# Patient Record
Sex: Female | Born: 1940
Health system: Southern US, Community
[De-identification: ages and names within clinical notes are randomized; demographics above are authoritative.]

## PROBLEM LIST (undated history)

## (undated) DIAGNOSIS — G459 Transient cerebral ischemic attack, unspecified: Secondary | ICD-10-CM

## (undated) DIAGNOSIS — T4145XA Adverse effect of unspecified anesthetic, initial encounter: Secondary | ICD-10-CM

## (undated) DIAGNOSIS — D126 Benign neoplasm of colon, unspecified: Secondary | ICD-10-CM

## (undated) DIAGNOSIS — C4370 Malignant melanoma of unspecified lower limb, including hip: Secondary | ICD-10-CM

## (undated) DIAGNOSIS — N86 Erosion and ectropion of cervix uteri: Secondary | ICD-10-CM

## (undated) DIAGNOSIS — K589 Irritable bowel syndrome without diarrhea: Secondary | ICD-10-CM

## (undated) DIAGNOSIS — G5600 Carpal tunnel syndrome, unspecified upper limb: Secondary | ICD-10-CM

## (undated) DIAGNOSIS — M810 Age-related osteoporosis without current pathological fracture: Secondary | ICD-10-CM

## (undated) DIAGNOSIS — T8859XA Other complications of anesthesia, initial encounter: Secondary | ICD-10-CM

## (undated) DIAGNOSIS — I2510 Atherosclerotic heart disease of native coronary artery without angina pectoris: Secondary | ICD-10-CM

## (undated) DIAGNOSIS — N950 Postmenopausal bleeding: Secondary | ICD-10-CM

## (undated) DIAGNOSIS — E119 Type 2 diabetes mellitus without complications: Secondary | ICD-10-CM

## (undated) DIAGNOSIS — I209 Angina pectoris, unspecified: Secondary | ICD-10-CM

## (undated) DIAGNOSIS — R112 Nausea with vomiting, unspecified: Secondary | ICD-10-CM

## (undated) DIAGNOSIS — N813 Complete uterovaginal prolapse: Secondary | ICD-10-CM

## (undated) DIAGNOSIS — Z86010 Personal history of colonic polyps: Secondary | ICD-10-CM

## (undated) DIAGNOSIS — Z8619 Personal history of other infectious and parasitic diseases: Secondary | ICD-10-CM

## (undated) DIAGNOSIS — Z9889 Other specified postprocedural states: Secondary | ICD-10-CM

## (undated) DIAGNOSIS — A419 Sepsis, unspecified organism: Secondary | ICD-10-CM

## (undated) DIAGNOSIS — L659 Nonscarring hair loss, unspecified: Secondary | ICD-10-CM

## (undated) DIAGNOSIS — K625 Hemorrhage of anus and rectum: Secondary | ICD-10-CM

## (undated) DIAGNOSIS — E039 Hypothyroidism, unspecified: Secondary | ICD-10-CM

## (undated) DIAGNOSIS — K219 Gastro-esophageal reflux disease without esophagitis: Secondary | ICD-10-CM

## (undated) DIAGNOSIS — Z860101 Personal history of adenomatous and serrated colon polyps: Secondary | ICD-10-CM

## (undated) DIAGNOSIS — I10 Essential (primary) hypertension: Secondary | ICD-10-CM

## (undated) DIAGNOSIS — E7800 Pure hypercholesterolemia, unspecified: Secondary | ICD-10-CM

## (undated) HISTORY — DX: Transient cerebral ischemic attack, unspecified: G45.9

## (undated) HISTORY — DX: Personal history of adenomatous and serrated colon polyps: Z86.0101

## (undated) HISTORY — DX: Postmenopausal bleeding: N95.0

## (undated) HISTORY — DX: Hypothyroidism, unspecified: E03.9

## (undated) HISTORY — DX: Benign neoplasm of colon, unspecified: D12.6

## (undated) HISTORY — DX: Sepsis, unspecified organism: A41.9

## (undated) HISTORY — DX: Complete uterovaginal prolapse: N81.3

## (undated) HISTORY — DX: Age-related osteoporosis without current pathological fracture: M81.0

## (undated) HISTORY — DX: Malignant melanoma of unspecified lower limb, including hip: C43.70

## (undated) HISTORY — DX: Pure hypercholesterolemia, unspecified: E78.00

## (undated) HISTORY — DX: Nonscarring hair loss, unspecified: L65.9

## (undated) HISTORY — DX: Erosion and ectropion of cervix uteri: N86

## (undated) HISTORY — DX: Hemorrhage of anus and rectum: K62.5

## (undated) HISTORY — DX: Personal history of colonic polyps: Z86.010

## (undated) HISTORY — PX: BREAST EXCISIONAL BIOPSY: SUR124

## (undated) HISTORY — DX: Personal history of other infectious and parasitic diseases: Z86.19

## (undated) HISTORY — DX: Irritable bowel syndrome without diarrhea: K58.9

## (undated) HISTORY — DX: Type 2 diabetes mellitus without complications: E11.9

## (undated) HISTORY — DX: Essential (primary) hypertension: I10

## (undated) HISTORY — DX: Carpal tunnel syndrome, unspecified upper limb: G56.00

## (undated) HISTORY — PX: KNEE SURGERY: SHX244

## (undated) HISTORY — DX: Atherosclerotic heart disease of native coronary artery without angina pectoris: I25.10

## (undated) HISTORY — PX: TONSILLECTOMY: SHX5217

## (undated) HISTORY — DX: Gastro-esophageal reflux disease without esophagitis: K21.9

---

## 2004-10-22 ENCOUNTER — Ambulatory Visit: Payer: Self-pay | Admitting: General Practice

## 2004-11-06 ENCOUNTER — Ambulatory Visit: Payer: Self-pay | Admitting: General Practice

## 2005-08-10 ENCOUNTER — Other Ambulatory Visit: Payer: Self-pay

## 2005-08-17 ENCOUNTER — Ambulatory Visit: Payer: Self-pay | Admitting: Unknown Physician Specialty

## 2008-01-15 ENCOUNTER — Ambulatory Visit: Payer: Self-pay | Admitting: Family Medicine

## 2008-03-25 HISTORY — PX: COLONOSCOPY: SHX174

## 2008-04-01 ENCOUNTER — Ambulatory Visit: Payer: Self-pay | Admitting: Family Medicine

## 2008-04-19 ENCOUNTER — Ambulatory Visit: Payer: Self-pay | Admitting: Unknown Physician Specialty

## 2008-05-14 DIAGNOSIS — K5730 Diverticulosis of large intestine without perforation or abscess without bleeding: Secondary | ICD-10-CM

## 2008-06-21 ENCOUNTER — Ambulatory Visit: Payer: Self-pay | Admitting: Surgery

## 2008-08-08 DIAGNOSIS — E538 Deficiency of other specified B group vitamins: Secondary | ICD-10-CM | POA: Insufficient documentation

## 2009-01-27 ENCOUNTER — Ambulatory Visit: Payer: Self-pay | Admitting: Family Medicine

## 2010-01-30 ENCOUNTER — Ambulatory Visit: Payer: Self-pay | Admitting: Family Medicine

## 2010-10-24 HISTORY — PX: CARDIAC CATHETERIZATION: SHX172

## 2011-12-31 ENCOUNTER — Ambulatory Visit: Payer: Self-pay | Admitting: Family Medicine

## 2013-01-16 ENCOUNTER — Ambulatory Visit: Payer: Self-pay | Admitting: Family Medicine

## 2013-09-28 ENCOUNTER — Ambulatory Visit: Payer: Self-pay | Admitting: Unknown Physician Specialty

## 2013-09-28 LAB — HM COLONOSCOPY

## 2013-10-04 ENCOUNTER — Ambulatory Visit: Payer: Self-pay | Admitting: Family Medicine

## 2013-11-07 LAB — LIPID PANEL
Cholesterol: 165 mg/dL (ref 0–200)
HDL: 63 mg/dL (ref 35–70)
LDL Cholesterol: 60 mg/dL
Triglycerides: 208 mg/dL — AB (ref 40–160)

## 2013-11-07 LAB — TSH: TSH: 1.1 u[IU]/mL (ref 0.41–5.90)

## 2013-11-07 LAB — HM DEXA SCAN

## 2014-04-29 LAB — BASIC METABOLIC PANEL
BUN: 11 mg/dL (ref 4–21)
CREATININE: 0.6 mg/dL (ref 0.5–1.1)
Glucose: 109 mg/dL
Potassium: 4.6 mmol/L (ref 3.4–5.3)
Sodium: 140 mmol/L (ref 137–147)

## 2014-04-29 LAB — CBC AND DIFFERENTIAL
HCT: 36 % (ref 36–46)
Hemoglobin: 11.9 g/dL — AB (ref 12.0–16.0)
Platelets: 324 10*3/uL (ref 150–399)
WBC: 8.9 10*3/mL

## 2014-04-29 LAB — HEMOGLOBIN A1C: HEMOGLOBIN A1C: 6.6 % — AB (ref 4.0–6.0)

## 2014-05-25 ENCOUNTER — Encounter: Payer: Self-pay | Admitting: *Deleted

## 2014-06-14 DIAGNOSIS — N950 Postmenopausal bleeding: Secondary | ICD-10-CM | POA: Insufficient documentation

## 2014-06-14 DIAGNOSIS — G459 Transient cerebral ischemic attack, unspecified: Secondary | ICD-10-CM | POA: Insufficient documentation

## 2014-06-14 DIAGNOSIS — E039 Hypothyroidism, unspecified: Secondary | ICD-10-CM | POA: Insufficient documentation

## 2014-06-14 DIAGNOSIS — E1165 Type 2 diabetes mellitus with hyperglycemia: Secondary | ICD-10-CM | POA: Insufficient documentation

## 2014-06-14 DIAGNOSIS — Z86010 Personal history of colonic polyps: Secondary | ICD-10-CM

## 2014-06-14 DIAGNOSIS — C4370 Malignant melanoma of unspecified lower limb, including hip: Secondary | ICD-10-CM | POA: Insufficient documentation

## 2014-06-14 DIAGNOSIS — N813 Complete uterovaginal prolapse: Secondary | ICD-10-CM | POA: Insufficient documentation

## 2014-06-14 DIAGNOSIS — E7800 Pure hypercholesterolemia, unspecified: Secondary | ICD-10-CM | POA: Insufficient documentation

## 2014-06-14 DIAGNOSIS — I10 Essential (primary) hypertension: Secondary | ICD-10-CM | POA: Insufficient documentation

## 2014-06-14 DIAGNOSIS — K625 Hemorrhage of anus and rectum: Secondary | ICD-10-CM | POA: Insufficient documentation

## 2014-06-14 DIAGNOSIS — E119 Type 2 diabetes mellitus without complications: Secondary | ICD-10-CM | POA: Insufficient documentation

## 2014-06-14 DIAGNOSIS — K219 Gastro-esophageal reflux disease without esophagitis: Secondary | ICD-10-CM | POA: Insufficient documentation

## 2014-06-14 DIAGNOSIS — M810 Age-related osteoporosis without current pathological fracture: Secondary | ICD-10-CM | POA: Insufficient documentation

## 2014-06-14 DIAGNOSIS — G5600 Carpal tunnel syndrome, unspecified upper limb: Secondary | ICD-10-CM | POA: Insufficient documentation

## 2014-06-14 DIAGNOSIS — I2510 Atherosclerotic heart disease of native coronary artery without angina pectoris: Secondary | ICD-10-CM | POA: Insufficient documentation

## 2014-06-14 DIAGNOSIS — L659 Nonscarring hair loss, unspecified: Secondary | ICD-10-CM

## 2014-06-14 DIAGNOSIS — K589 Irritable bowel syndrome without diarrhea: Secondary | ICD-10-CM

## 2014-08-14 ENCOUNTER — Ambulatory Visit (INDEPENDENT_AMBULATORY_CARE_PROVIDER_SITE_OTHER): Payer: PPO | Admitting: Obstetrics and Gynecology

## 2014-08-14 ENCOUNTER — Other Ambulatory Visit
Admission: RE | Admit: 2014-08-14 | Discharge: 2014-08-14 | Disposition: A | Discharge status: Home / Self Care | Payer: PPO | Source: Ambulatory Visit | Attending: Family Medicine | Admitting: Family Medicine

## 2014-08-14 ENCOUNTER — Encounter: Payer: Self-pay | Admitting: Obstetrics and Gynecology

## 2014-08-14 ENCOUNTER — Ambulatory Visit (INDEPENDENT_AMBULATORY_CARE_PROVIDER_SITE_OTHER): Payer: PPO | Admitting: Family Medicine

## 2014-08-14 ENCOUNTER — Telehealth: Payer: Self-pay

## 2014-08-14 ENCOUNTER — Encounter: Payer: Self-pay | Admitting: Family Medicine

## 2014-08-14 ENCOUNTER — Telehealth: Payer: Self-pay | Admitting: Family Medicine

## 2014-08-14 VITALS — BP 197/92 | HR 68 | Ht 63.6 in | Wt 150.4 lb

## 2014-08-14 VITALS — BP 164/92 | HR 63 | Temp 98.6°F | Resp 16

## 2014-08-14 DIAGNOSIS — I25119 Atherosclerotic heart disease of native coronary artery with unspecified angina pectoris: Secondary | ICD-10-CM

## 2014-08-14 DIAGNOSIS — I2510 Atherosclerotic heart disease of native coronary artery without angina pectoris: Secondary | ICD-10-CM | POA: Diagnosis not present

## 2014-08-14 DIAGNOSIS — Z4689 Encounter for fitting and adjustment of other specified devices: Secondary | ICD-10-CM

## 2014-08-14 DIAGNOSIS — E559 Vitamin D deficiency, unspecified: Secondary | ICD-10-CM | POA: Diagnosis not present

## 2014-08-14 DIAGNOSIS — I10 Essential (primary) hypertension: Secondary | ICD-10-CM

## 2014-08-14 DIAGNOSIS — R079 Chest pain, unspecified: Secondary | ICD-10-CM | POA: Insufficient documentation

## 2014-08-14 LAB — CBC
HCT: 35.5 % (ref 35.0–47.0)
Hemoglobin: 11.5 g/dL — ABNORMAL LOW (ref 12.0–16.0)
MCH: 25.9 pg — ABNORMAL LOW (ref 26.0–34.0)
MCHC: 32.3 g/dL (ref 32.0–36.0)
MCV: 80.2 fL (ref 80.0–100.0)
Platelets: 342 10*3/uL (ref 150–440)
RBC: 4.43 MIL/uL (ref 3.80–5.20)
RDW: 14.6 % — ABNORMAL HIGH (ref 11.5–14.5)
WBC: 8.8 10*3/uL (ref 3.6–11.0)

## 2014-08-14 LAB — RENAL FUNCTION PANEL
Albumin: 4 g/dL (ref 3.5–5.0)
Anion gap: 11 (ref 5–15)
BUN: 10 mg/dL (ref 6–20)
CALCIUM: 9.4 mg/dL (ref 8.9–10.3)
CO2: 28 mmol/L (ref 22–32)
CREATININE: 0.58 mg/dL (ref 0.44–1.00)
Chloride: 98 mmol/L — ABNORMAL LOW (ref 101–111)
GFR calc Af Amer: >60 mL/min (ref >60)
GFR calc non Af Amer: >60 mL/min (ref >60)
GLUCOSE: 103 mg/dL — AB (ref 65–99)
PHOSPHORUS: 3.1 mg/dL (ref 2.5–4.6)
Potassium: 3.5 mmol/L (ref 3.5–5.1)
SODIUM: 137 mmol/L (ref 135–145)

## 2014-08-14 LAB — TSH: TSH: 3.019 u[IU]/mL (ref 0.350–4.500)

## 2014-08-14 LAB — TROPONIN I

## 2014-08-14 MED ORDER — AMLODIPINE BESYLATE 5 MG PO TABS: 5.0000 mg | ORAL_TABLET | Freq: Every day | ORAL | Status: DC

## 2014-08-14 MED ORDER — NITROGLYCERIN 0.4 MG SL SUBL: 0.4000 mg | SUBLINGUAL_TABLET | SUBLINGUAL | Status: DC | PRN

## 2014-08-14 NOTE — Telephone Encounter (Signed)
Pt advised as directed below.   Thanks,   -Keiji Melland  

## 2014-08-14 NOTE — Telephone Encounter (Signed)
April Pittman with [facility] would like a nurse to return his call because they received an RX today that will have adverse reaction to medication that pt is already taking. Simvastatin & Amlodipine are the medications that April Pittman stated would have adverse effects for the pt. Thanks TNP

## 2014-08-14 NOTE — Progress Notes (Signed)
Patient: April Pittman Female    DOB: [birth year]   74 y.o.   MRN: [id number] Visit Date: 08/14/2014  Today's Provider: [name], MD   Chief Complaint  Patient presents with  . Chest Pain   Subjective:    Chest Pain  This is a new problem. The current episode started more than 1 month ago. The onset quality is undetermined. The problem occurs intermittently. The pain is at a severity of 3/10. The pain is mild. The quality of the pain is described as dull. The pain radiates to the mid back. Associated symptoms include a cough, malaise/fatigue, palpitations and shortness of breath. Pertinent negatives include no abdominal pain, dizziness, exertional chest pressure, fever, headaches or leg pain. It is unknown what precipitates the cough. The cough is hacking. Nothing worsens the cough.  Her past medical history is significant for diabetes, hypertension and thyroid problem.  Her family medical history is significant for CAD, heart disease, hypertension and stroke.  Patient was at her GYN this morning and had her bp check twice 1=197/92 and 2=167/92. Patient came in to office to get bp check again. Patient has been having chest back for several weeks. Patient describes pain as feeling like gas pressure that radiates to her back. Pain is not similar to previous chest pains associated with heart disease. She states she has pains most days, but it feels more like gas. Is scheduled for stress test tomorrow ordered by Dr. Ubaldo Glassing due to dyspnea on exertion when she was seen by him in May. She was not having chest pain at that time.   Previous Medications   ASPIRIN EC 81 MG TABLET    Take 81 mg by mouth daily.   CHOLECALCIFEROL (VITAMIN D) 400 UNITS TABS TABLET    Take 1,000 Units by mouth daily.   CLOPIDOGREL (PLAVIX) 75 MG TABLET    Take 75 mg by mouth daily.   ESTRADIOL (ESTRACE) 0.1 MG/GM VAGINAL CREAM    Place 1 Applicatorful vaginally once a week.   LEVOTHYROXINE (SYNTHROID, LEVOTHROID) 125  MCG TABLET    Take 125 mcg by mouth daily before breakfast.   LISINOPRIL-HYDROCHLOROTHIAZIDE (PRINZIDE,ZESTORETIC) 20-25 MG PER TABLET    Take 1 tablet by mouth daily.   METFORMIN (GLUCOPHAGE) 1000 MG TABLET    Take 1,000 mg by mouth 2 (two) times daily with a meal.   METOPROLOL (LOPRESSOR) 50 MG TABLET    Take 50 mg by mouth daily.   OXYQUINOLONE SULFATE VAGINAL 0.025 % GEL    Place 1 application vaginally once a week.   SIMVASTATIN (ZOCOR) 40 MG TABLET    Take 40 mg by mouth daily.   TIZANIDINE (ZANAFLEX) 4 MG CAPSULE    Take 4 mg by mouth 4 (four) times daily as needed for muscle spasms.    Review of Systems  Constitutional: Positive for malaise/fatigue. Negative for fever.  Respiratory: Positive for cough and shortness of breath. Negative for wheezing.   Cardiovascular: Positive for chest pain and palpitations. Negative for leg swelling.  Gastrointestinal: Negative for abdominal pain.  Neurological: Negative for dizziness and headaches.    History  Substance Use Topics  . Smoking status: Never Smoker   . Smokeless tobacco: Never Used  . Alcohol Use: No   Objective:   Filed Vitals:   08/14/14 1023 08/14/14 1051  BP: 140/94 164/92  Pulse: 63   Temp: 98.6 F (37 C)   TempSrc: Oral   Resp: 16   SpO2: 98%  Physical Exam      Assessment & Plan:      1. Essential hypertension BP unsually elevated at gyn today, but better now.  - amLODipine (NORVASC) 5 MG tablet; Take 1 tablet (5 mg total) by mouth daily.  Dispense: 30 tablet; Refill: 0 - Renal Function Panel - TSH  2. Chest pain, unspecified chest pain type Not typical for cardiac angina, but she does have history of CAD. She is scheduled for stress test tomorrow, but I think we need to check cardiac enzymes first.  - Troponin I - CBC - Renal Function Panel - nitroGLYCERIN (NITROSTAT) 0.4 MG SL tablet; Place 1 tablet (0.4 mg total) under the tongue every 5 (five) minutes as needed for chest pain.  Dispense: 10  tablet; Refill: 0  3. Coronary artery disease involving native coronary artery of native heart without angina pectoris Stress test tomorrow as ordered by Dr. Ubaldo Glassing if cardiac enzymes are normal today.

## 2014-08-14 NOTE — Telephone Encounter (Signed)
Tried pt again still no answer.

## 2014-08-14 NOTE — Telephone Encounter (Signed)
-----   Message from Birdie Sons, MD sent at 08/14/2014  2:56 PM EDT ----- Please advise patient that labs are all normal and that her chest pain is probably not her heart. Go ahead and start amlodipine that was prescribed to keep her blood pressure down and follow up for stress tomorrow as already scheduled.

## 2014-08-14 NOTE — Patient Instructions (Signed)
1.  Return 2 weeks for pessary insertion. 2.  Follow-up for exercise tolerance test with Dr. Ubaldo Glassing tomorrow

## 2014-08-14 NOTE — Progress Notes (Signed)
Patient ID: April Pittman, female   DOB: March 04, 1940, 74 y.o.   MRN: [id number] Presents for pessary (gellhorn) maintenance. Pessary not in for last few days. Pt. Takes out as she desires. Using Estrace Cream "every so often", and Trimo-san gel weekly. No vaginal discharge, odor, bleeding or pain. B&B habits normal. No c/o UTI SYMPTONS. PT. States she has had chest pain and seen Dr. Ubaldo Glassing and has stress test tomorrow.  GYN ENCOUNTER NOTE  Subjective:       April Pittman is a 74 y.o. [phone] female is here for gynecologic evaluation of the following issues:  1. Pessary maintenance. 2.  Uterine procidentia with traction cystocele.  Patient is still able to insert and remove her gel horn pessary.  It has been out several days.  She only removed the pessary.  Once since her last visit.  She denies vaginal bleeding or discharge..     Gynecologic History No LMP recorded. Patient is postmenopausal. Contraception: post menopausal status   Obstetric History OB History  Gravida Para Term Preterm AB SAB TAB Ectopic Multiple Living  4 4 4       4     # Outcome Date GA Lbr Len/2nd Weight Sex Delivery Anes PTL Lv  4 Term     M Vag-Spont     3 Term     M Vag-Spont   Y  2 Term     F Vag-Spont   Y  1 Term     M Vag-Spont   Y      Past Medical History  Diagnosis Date  . Procidentia of uterus     with traction cystocele, managed with gellhorn pessary  . H/O adenomatous polyp of colon     with sever dysplasia, removed 2000 by colonoscopy  . Osteoporosis   . Post-menopausal bleeding   . Hypothyroid   . Hypertension   . Hypercholesteremia   . TIA (transient ischemic attack)   . GERD (gastroesophageal reflux disease)   . Alopecia   . Malignant melanoma of foot   . Irritable bowel   . CAD (coronary artery disease)   . DM (diabetes mellitus)   . Benign neoplasm of large bowel   . Carpal tunnel syndrome   . Erosion of cervix   . Rectal bleeding     No past surgical history on  file.  Current Outpatient Prescriptions on File Prior to Visit  Medication Sig Dispense Refill  . aspirin EC 81 MG tablet Take 81 mg by mouth daily.    . clopidogrel (PLAVIX) 75 MG tablet Take 75 mg by mouth daily.    Marland Kitchen estradiol (ESTRACE) 0.1 MG/GM vaginal cream Place 1 Applicatorful vaginally once a week.    . levothyroxine (SYNTHROID, LEVOTHROID) 125 MCG tablet Take 125 mcg by mouth daily before breakfast.    . lisinopril-hydrochlorothiazide (PRINZIDE,ZESTORETIC) 20-25 MG per tablet Take 1 tablet by mouth daily.    . metFORMIN (GLUCOPHAGE) 1000 MG tablet Take 1,000 mg by mouth 2 (two) times daily with a meal.    . metoprolol (LOPRESSOR) 50 MG tablet Take 50 mg by mouth daily.    Levin Erp SULFATE VAGINAL 0.025 % GEL Place 1 application vaginally once a week.    . simvastatin (ZOCOR) 40 MG tablet Take 40 mg by mouth daily.    Marland Kitchen tiZANidine (ZANAFLEX) 4 MG capsule Take 4 mg by mouth 4 (four) times daily as needed for muscle spasms.     No current facility-administered medications on file prior  to visit.    No Known Allergies  History   Social History  . Marital Status: Married    Spouse Name: N/A  . Number of Children: N/A  . Years of Education: N/A   Occupational History  . Not on file.   Social History Main Topics  . Smoking status: Never Smoker   . Smokeless tobacco: Never Used  . Alcohol Use: No  . Drug Use: No  . Sexual Activity: No   Other Topics Concern  . Not on file   Social History Narrative    No family history on file.  The following portions of the patient's history were reviewed and updated as appropriate: allergies, current medications, past family history, past medical history, past social history, past surgical history and problem list.  Review of Systems  Review of Systems - General ROS: negative for - chills, fatigue, fever, hot flashes, malaise or night sweats Gastrointestinal ROS: negative for - abdominal pain, blood in stools, change in  bowel habits and nausea/vomiting Musculoskeletal ROS: negative for - joint pain, muscle pain or muscular weakness Genito-Urinary ROS: negative for - genital discharge, genital ulcers, hematuria, incontinence, irregular/heavy menses, nocturia or pelvic pain.  Objective:   BP 167/92 mmHg  Pulse 62  Ht 5' 3.6" (1.615 m)  Wt 150 lb 6 oz (68.21 kg)  BMI 26.15 kg/m2 CONSTITUTIONAL: Well-developed, well-nourished female in no acute distress. Mild senile dementia. HENT:  Normocephalic, atraumatic.  SKIN: Skin is warm and dry. No rash noted. Not diaphoretic. No erythema. No pallor. Needles: Alert and oriented to person, place, and time. PSYCHIATRIC: Normal mood and affect. Normal behavior. Normal judgment and thought content. CARDIOVASCULAR:Not Examined RESPIRATORY: Not Examined BREASTS: Not Examined ABDOMEN: Soft, non distended; Non tender.  No Organomegaly. PELVIC:  External Genitalia: Normal  BUS: Normal  Vagina: Normal  Cervix: Parous; ecchymotic in several areas without ulcerations  Uterus: Normal size, shape,consistency, mobile  RV: Normal External exam  Bladder: Nontender MUSCULOSKELETAL: Normal range of motion. No tenderness.  No cyanosis, clubbing, or edema.     Assessment:   1.  Pessary maintenance. 2.  Cervix with ecchymoses without ulceration, possibly due to trauma from prolonged pessary use   Plan:  1.  Leave pessary out for 2 weeks. 2.  Return in 2 weeks for pessary insertion after recheck.

## 2014-08-14 NOTE — Telephone Encounter (Signed)
Go ahead and start amlodipine, but reduce simvastatin to 1/2 tablet a day (20mg ) for the time being. Thanks.

## 2014-08-14 NOTE — Telephone Encounter (Signed)
Pt advised as directed below.  She was checking to see if you got the lab work back yet?  Thanks,   -Mickel Baas

## 2014-08-14 NOTE — Telephone Encounter (Signed)
No answer at pt home #. Will try again later.

## 2014-08-28 ENCOUNTER — Ambulatory Visit (INDEPENDENT_AMBULATORY_CARE_PROVIDER_SITE_OTHER): Payer: PPO | Admitting: Obstetrics and Gynecology

## 2014-08-28 ENCOUNTER — Encounter: Payer: Self-pay | Admitting: Obstetrics and Gynecology

## 2014-08-28 VITALS — BP 173/77 | HR 77 | Ht 63.6 in | Wt 152.6 lb

## 2014-08-28 DIAGNOSIS — R300 Dysuria: Secondary | ICD-10-CM

## 2014-08-28 DIAGNOSIS — Z4689 Encounter for fitting and adjustment of other specified devices: Secondary | ICD-10-CM | POA: Diagnosis not present

## 2014-08-28 LAB — POCT URINALYSIS DIPSTICK
Bilirubin, UA: NEGATIVE
Blood, UA: NEGATIVE
Ketones, UA: NEGATIVE
Leukocytes, UA: NEGATIVE
NITRITE UA: NEGATIVE
PH UA: 6
PROTEIN UA: NEGATIVE
Spec Grav, UA: 1.015
UROBILINOGEN UA: 0.2

## 2014-08-28 NOTE — Progress Notes (Signed)
Patient ID: April Pittman, female   DOB: 07/15/1940, 74 y.o.   MRN: [id number]   2 week f/u pessary insertion- gellhorn

## 2014-08-29 ENCOUNTER — Encounter: Payer: Self-pay | Admitting: Obstetrics and Gynecology

## 2014-08-29 NOTE — Progress Notes (Signed)
   Subjective:    Patient ID: April Pittman, female    DOB: [birth year], 74 y.o.   MRN: [id number]  HPI  Patient presents for two-week pessary check.  GelPort pessary was left out.  Because of ulceration of vagina.  She denies vaginal bleeding or discharge at this time.    Review of Systems GU-negative Except for dysuria GYN-negative. GI-negative. Constitutional -no fever, chills or sweats    Objective:   Physical Exam  Pleasant female in no acute distress.  Mild dementia.  Neurologic changes noted. Abdomen-soft, nontender. Pelvic-  External genitalia normal.  BUS normal.  Vagina normal; ulceration resolved  Cervix normal  PROCEDURE: Pessary is reinserted        Assessment & Plan:  Vaginal ulceration, resolved. Pessary reinserted. Return in 4 months for recheck. Urine culture.

## 2014-09-24 ENCOUNTER — Other Ambulatory Visit: Payer: Self-pay | Admitting: Family Medicine

## 2014-11-11 ENCOUNTER — Encounter: Payer: Self-pay | Admitting: Family Medicine

## 2014-11-11 ENCOUNTER — Ambulatory Visit (INDEPENDENT_AMBULATORY_CARE_PROVIDER_SITE_OTHER): Payer: PPO | Admitting: Family Medicine

## 2014-11-11 ENCOUNTER — Other Ambulatory Visit: Payer: Self-pay | Admitting: Family Medicine

## 2014-11-11 VITALS — BP 140/90 | HR 83 | Temp 98.5°F | Resp 16 | Ht 63.6 in | Wt 152.0 lb

## 2014-11-11 DIAGNOSIS — M810 Age-related osteoporosis without current pathological fracture: Secondary | ICD-10-CM

## 2014-11-11 DIAGNOSIS — Z0000 Encounter for general adult medical examination without abnormal findings: Secondary | ICD-10-CM | POA: Diagnosis not present

## 2014-11-11 DIAGNOSIS — Z1231 Encounter for screening mammogram for malignant neoplasm of breast: Secondary | ICD-10-CM

## 2014-11-11 DIAGNOSIS — E039 Hypothyroidism, unspecified: Secondary | ICD-10-CM | POA: Diagnosis not present

## 2014-11-11 DIAGNOSIS — E7800 Pure hypercholesterolemia, unspecified: Secondary | ICD-10-CM

## 2014-11-11 DIAGNOSIS — E119 Type 2 diabetes mellitus without complications: Secondary | ICD-10-CM

## 2014-11-11 DIAGNOSIS — I2510 Atherosclerotic heart disease of native coronary artery without angina pectoris: Secondary | ICD-10-CM

## 2014-11-11 DIAGNOSIS — I10 Essential (primary) hypertension: Secondary | ICD-10-CM

## 2014-11-11 DIAGNOSIS — E559 Vitamin D deficiency, unspecified: Secondary | ICD-10-CM | POA: Diagnosis not present

## 2014-11-11 NOTE — Patient Instructions (Signed)
.   I recommend that you get a flu vaccine this year. Please call our office at [phone] at your earliest convenience to schedule a flu shot.    

## 2014-11-11 NOTE — Progress Notes (Signed)
Patient: April Pittman, Female    DOB: [birth year], 74 y.o.   MRN: [id number] Visit Date: 11/11/2014  Today's Provider: [name], MD   Chief Complaint  Patient presents with  . Medicare Wellness   Subjective:    Annual wellness visit April Pittman is a 74 y.o. female. She feels fairly well. She reports exercising yes/walking. She reports she is sleeping fairly well.  -----------------------------------------------------------  Follow-up for CAD and chest pain from 08/14/2014; labs and started nitroGLYCERIN (NITROSTAT) 0.4 MG SL tablet; Place 1 tablet (0.4 mg total) under the tongue every 5 (five) minutes as needed for chest pain. Follow-up for vitamin D and vitamin B deficiency from 04/29/2014; started vitamin D3 OTC 1,000 units qd and started vitamin B12 OTC 1,000 mg qd.    Diabetes Mellitus Type II, Follow-up:   Lab Results  Component Value Date   HGBA1C 6.6* 04/29/2014   Last seen for diabetes 6 months ago.  Management since then includes none. She reports good compliance with treatment. She is not having side effects. none Current symptoms include none and have been stable. Home blood sugar records: fasting range: 115  Episodes of hypoglycemia? no   Current Insulin Regimen: n/a Most Recent Eye Exam: 2014  Weight trend: stable Prior visit with dietician: no Current diet: well balanced Current exercise: walking  ------------------------------------------------------------------------   Hypertension, follow-up:  BP Readings from Last 3 Encounters:  11/11/14 140/90  08/28/14 173/77  08/14/14 164/92    She was last seen for hypertension 3 months ago.  BP at that visit was 140/94. Management since that visit includes; started amLODipine (NORVASC) 5 MG tablet; Take 1 tablet (5 mg total) by mouth daily .She reports good compliance with treatment. She is not having side effects. none  She is exercising. She is not adherent to low salt diet.     Outside blood pressures are only checks it here. She is experiencing fatigue.  Patient denies fatigue.   Cardiovascular risk factors include diabetes mellitus.  Use of agents associated with hypertension: none.   ---------------------------------------------------------------------    Lipid/Cholesterol, Follow-up:   Last seen for this 6 months ago.  Management since that visit includes none.  Last Lipid Panel:    Component Value Date/Time   CHOL 165 11/07/2013   TRIG 208* 11/07/2013   HDL 63 11/07/2013   LDLCALC 60 11/07/2013    She reports good compliance with treatment. She is not having side effects. none  Wt Readings from Last 3 Encounters:  11/11/14 152 lb (68.947 kg)  08/28/14 152 lb 9.6 oz (69.219 kg)  08/14/14 150 lb 6 oz (68.21 kg)    ------------------------------------------------------------------------  Lab Results  Component Value Date   TSH 3.019 08/14/2014     Review of Systems  Constitutional: Positive for fatigue.  HENT: Positive for sneezing.   Eyes: Positive for photophobia.  Respiratory: Positive for cough and shortness of breath.   Cardiovascular: Negative.   Gastrointestinal: Negative.   Endocrine: Negative.   Genitourinary: Negative.   Musculoskeletal: Negative.   Skin: Negative.   Allergic/Immunologic: Negative.   Neurological: Negative.   Hematological: Positive for adenopathy.  Psychiatric/Behavioral: Negative.     Social History   Social History  . Marital Status: Married    Spouse Name: N/A  . Number of Children: 3  . Years of Education: N/A   Occupational History  . Retired     former Engineer, manufacturing systems   Social History Main Topics  . Smoking status:  Never Smoker   . Smokeless tobacco: Never Used  . Alcohol Use: No  . Drug Use: No  . Sexual Activity: No   Other Topics Concern  . Not on file   Social History Narrative    Patient Active Problem List   Diagnosis Date Noted  . Vitamin D deficiency 08/14/2014   . Rectal/anal hemorrhage 06/14/2014  . Postmenopausal vaginal bleeding 06/14/2014  . Hypothyroidism 06/14/2014  . Hypertension 06/14/2014  . Hypercholesteremia 06/14/2014  . TIA (transient ischemic attack) 06/14/2014  . GERD (gastroesophageal reflux disease) 06/14/2014  . Alopecia 06/14/2014  . Malignant melanoma of foot 06/14/2014  . Irritable bowel 06/14/2014  . Coronary artery disease 06/14/2014  . Diabetes mellitus, type 2 06/14/2014  . Carpal tunnel syndrome 06/14/2014  . Procidentia of uterus 06/14/2014  . Osteoporosis 06/14/2014  . History of adenomatous polyp of colon 06/14/2014    Past Surgical History  Procedure Laterality Date  . Tonsillectomy      as a child  . Breast biopsy      Benign  . Knee surgery    . Cardiac catheterization  10/2010    +stenting to LAD and RCA; symptoms: chest pain, elevated Troponin, +AMI. [employer]  . Colonoscopy  03/2008    2 polyps, internal and external hemorrhoids, +anal growth. Dr. Vira Agar    Her family history includes Diabetes in her father; Heart disease in her paternal grandmother and sister; Lung cancer in her brother. There is no history of Breast cancer, Ovarian cancer, or Colon cancer.    Previous Medications   AMLODIPINE (NORVASC) 5 MG TABLET    TAKE 1 TABLET (5 MG TOTAL) BY MOUTH DAILY.   ASPIRIN EC 81 MG TABLET    Take 81 mg by mouth daily.   BLOOD GLUCOSE MONITORING SUPPL (ONE TOUCH ULTRA MINI) W/DEVICE KIT       CHOLECALCIFEROL (VITAMIN D) 400 UNITS TABS TABLET    Take 1,000 Units by mouth daily.   CLOPIDOGREL (PLAVIX) 75 MG TABLET    Take 75 mg by mouth daily.   CYANOCOBALAMIN (RA VITAMIN B-12 TR) 1000 MCG TBCR    Take by mouth.   ESTRADIOL (ESTRACE) 0.1 MG/GM VAGINAL CREAM    Place 1 Applicatorful vaginally once a week.   GLUCOSE BLOOD (ONE TOUCH ULTRA TEST) TEST STRIP       LEVOTHYROXINE (SYNTHROID, LEVOTHROID) 125 MCG TABLET    Take 125 mcg by mouth daily before breakfast.   LISINOPRIL-HYDROCHLOROTHIAZIDE  (PRINZIDE,ZESTORETIC) 20-25 MG PER TABLET    Take 1 tablet by mouth daily.   METFORMIN (GLUCOPHAGE) 1000 MG TABLET    Take 1,000 mg by mouth 2 (two) times daily with a meal.   METOPROLOL (LOPRESSOR) 50 MG TABLET    Take 50 mg by mouth daily.   NITROGLYCERIN (NITROSTAT) 0.4 MG SL TABLET    Place 1 tablet (0.4 mg total) under the tongue every 5 (five) minutes as needed for chest pain.   ONETOUCH DELICA LANCETS FINE MISC       OXYQUINOLONE SULFATE VAGINAL 0.025 % GEL    Place 1 application vaginally once a week.   SIMVASTATIN (ZOCOR) 40 MG TABLET    Take 40 mg by mouth daily.   TIZANIDINE (ZANAFLEX) 4 MG CAPSULE    Take 4 mg by mouth 4 (four) times daily as needed for muscle spasms.   UNABLE TO FIND        Patient Care Team: Birdie Sons, MD as PCP - General (Family Medicine) Seeplaputhur Darnell Level  Jamal Collin, MD as Consulting Physician (General Surgery) Brayton Mars, MD as Consulting Physician (Obstetrics and Gynecology) Manya Silvas, MD (Gastroenterology) Teodoro Spray, MD as Consulting Physician (Cardiology)     Objective:   Vitals: BP 140/90 mmHg  Pulse 83  Temp(Src) 98.5 F (36.9 C) (Oral)  Resp 16  Ht 5' 3.6" (1.615 m)  Wt 152 lb (68.947 kg)  BMI 26.43 kg/m2  SpO2 97%  Physical Exam   General Appearance:    Alert, cooperative, no distress, appears stated age  Head:    Normocephalic, without obvious abnormality, atraumatic  Eyes:    PERRL, conjunctiva/corneas clear, EOM's intact, fundi    benign, both eyes  Ears:    Normal TM's and external ear canals, both ears  Nose:   Nares normal, septum midline, mucosa normal, no drainage    or sinus tenderness  Throat:   Lips, mucosa, and tongue normal; teeth and gums normal  Neck:   Supple, symmetrical, trachea midline, no adenopathy;    thyroid:  no enlargement/tenderness/nodules; no carotid   bruit or JVD  Back:     Symmetric, no curvature, ROM normal, no CVA tenderness  Lungs:     Clear to auscultation bilaterally,  respirations unlabored  Chest Wall:    No tenderness or deformity   Heart:    Regular rate and rhythm, S1 and S2 normal, no murmur, rub   or gallop  Breast Exam:    normal appearance, no masses or tenderness, deferred  Abdomen:     Soft, non-tender, bowel sounds active all four quadrants,    no masses, no organomegaly  Pelvic:    deferred  Extremities:   Extremities normal, atraumatic, no cyanosis or edema  Pulses:   2+ and symmetric all extremities  Skin:   Skin color, texture, turgor normal, no rashes or lesions  Lymph nodes:   Cervical, supraclavicular, and axillary nodes normal  Neurologic:   CNII-XII intact, normal strength, sensation and reflexes    throughout    Activities of Daily Living In your present state of health, do you have any difficulty performing the following activities: 11/11/2014  Hearing? N  Vision? Y  Difficulty concentrating or making decisions? Y  Walking or climbing stairs? Y  Dressing or bathing? N  Doing errands, shopping? N    Fall Risk Assessment Fall Risk  11/11/2014  Falls in the past year? No     Depression Screen PHQ 2/9 Scores 11/11/2014  PHQ - 2 Score 0    Cognitive Testing - 6-CIT  Refused.     Assessment & Plan:     Annual Wellness Visit  Reviewed patient's Family Medical History Reviewed and updated list of patient's medical providers Assessment of cognitive impairment was done Assessed patient's functional ability Established a written schedule for health screening [place] Completed and Reviewed  Exercise Activities and Dietary recommendations Goals    None      Immunization History  Administered Date(s) Administered  . Pneumococcal Conjugate-13 11/07/2013  . Pneumococcal Polysaccharide-23 12/07/2010    Health Maintenance  Topic Date Due  . FOOT EXAM  12/06/1950  . OPHTHALMOLOGY EXAM  12/06/1950  . TETANUS/TDAP  12/06/1959  . MAMMOGRAM  12/06/1990  . ZOSTAVAX  12/05/2000  . INFLUENZA  VACCINE  09/23/2014  . HEMOGLOBIN A1C  10/30/2014  . COLONOSCOPY  09/29/2018  . DEXA SCAN  Completed  . PNA vac Low Risk Adult  Completed      Discussed health benefits of physical activity,  and encouraged her to engage in regular exercise appropriate for her age and condition.    ------------------------------------------------------------------------------------------------------------ 1. Annual physical exam   2. Type 2 diabetes mellitus without complication  - Hemoglobin A1c - Renal function panel - Ambulatory referral to Ophthalmology  3. Coronary artery disease involving native coronary artery of native heart without angina pectoris Asymptomatic. Compliant with medication.  Continue aggressive risk factor modification.  Continue routine follow up Dr. Ubaldo Glassing   4. Hypercholesteremia She is tolerating simvastatin well with no adverse effects.   - Lipid panel  5. Essential hypertension well controlled Continue current medications. Tolerating addition of amlodipine well.   6. Hypothyroidism, unspecified hypothyroidism type TSH in normal range in June.   7. Osteoporosis Intolerant to bisphosphonates and refused other medications at this point.   8. Vitamin D deficiency  - Vit D  25 hydroxy (rtn osteoporosis monitoring)

## 2014-11-12 LAB — LIPID PANEL
Chol/HDL Ratio: 3 ratio units (ref 0.0–4.4)
Cholesterol, Total: 176 mg/dL (ref 100–199)
HDL: 58 mg/dL (ref >39)
LDL CALC: 70 mg/dL (ref 0–99)
Triglycerides: 239 mg/dL — ABNORMAL HIGH (ref 0–149)
VLDL Cholesterol Cal: 48 mg/dL — ABNORMAL HIGH (ref 5–40)

## 2014-11-12 LAB — HEMOGLOBIN A1C
ESTIMATED AVERAGE GLUCOSE: 177 mg/dL
HEMOGLOBIN A1C: 7.8 % — AB (ref 4.8–5.6)

## 2014-11-12 LAB — RENAL FUNCTION PANEL
Albumin: 4.5 g/dL (ref 3.5–4.8)
BUN/Creatinine Ratio: 27 — ABNORMAL HIGH (ref 11–26)
BUN: 17 mg/dL (ref 8–27)
CO2: 28 mmol/L (ref 18–29)
Calcium: 9.5 mg/dL (ref 8.7–10.3)
Chloride: 95 mmol/L — ABNORMAL LOW (ref 97–108)
Creatinine, Ser: 0.63 mg/dL (ref 0.57–1.00)
GFR calc Af Amer: 103 mL/min/{1.73_m2} (ref >59)
GFR, EST NON AFRICAN AMERICAN: 89 mL/min/{1.73_m2} (ref >59)
Glucose: 136 mg/dL — ABNORMAL HIGH (ref 65–99)
Phosphorus: 3.2 mg/dL (ref 2.5–4.5)
Potassium: 4 mmol/L (ref 3.5–5.2)
SODIUM: 137 mmol/L (ref 134–144)

## 2014-11-12 LAB — VITAMIN D 25 HYDROXY (VIT D DEFICIENCY, FRACTURES): Vit D, 25-Hydroxy: 19.9 ng/mL — ABNORMAL LOW (ref 30.0–100.0)

## 2014-11-14 ENCOUNTER — Ambulatory Visit
Admission: RE | Admit: 2014-11-14 | Discharge: 2014-11-14 | Disposition: A | Discharge status: Home / Self Care | Payer: PPO | Source: Ambulatory Visit | Attending: Family Medicine | Admitting: Family Medicine

## 2014-11-14 DIAGNOSIS — Z1231 Encounter for screening mammogram for malignant neoplasm of breast: Secondary | ICD-10-CM | POA: Insufficient documentation

## 2014-11-15 LAB — HM DIABETES EYE EXAM

## 2014-11-20 ENCOUNTER — Encounter: Payer: Self-pay | Admitting: *Deleted

## 2014-11-28 ENCOUNTER — Other Ambulatory Visit: Payer: Self-pay | Admitting: Family Medicine

## 2014-11-28 DIAGNOSIS — E119 Type 2 diabetes mellitus without complications: Secondary | ICD-10-CM

## 2015-01-22 ENCOUNTER — Ambulatory Visit (INDEPENDENT_AMBULATORY_CARE_PROVIDER_SITE_OTHER): Payer: PPO | Admitting: Obstetrics and Gynecology

## 2015-01-22 ENCOUNTER — Encounter: Payer: Self-pay | Admitting: Obstetrics and Gynecology

## 2015-01-22 VITALS — BP 117/69 | HR 71 | Ht 63.5 in | Wt 152.1 lb

## 2015-01-22 DIAGNOSIS — Z4689 Encounter for fitting and adjustment of other specified devices: Secondary | ICD-10-CM | POA: Diagnosis not present

## 2015-01-22 DIAGNOSIS — N8110 Cystocele, unspecified: Secondary | ICD-10-CM

## 2015-01-22 DIAGNOSIS — IMO0002 Reserved for concepts with insufficient information to code with codable children: Secondary | ICD-10-CM

## 2015-01-22 DIAGNOSIS — N813 Complete uterovaginal prolapse: Secondary | ICD-10-CM

## 2015-01-22 NOTE — Progress Notes (Signed)
Patient ID: LAQUITA LECOUNT, female   DOB: [birth year], 74 y.o.   MRN: [id number]   Chief complaint: 1.  Procidentia 2.  Traction cystocele    4 month pessary check- gellhorn NO vag bleeding or pain Estrace cream- once a week- or so trimosan gel- weekly  Past medical history, past surgical history, problems, medications, and allergies are reviewed.  OBJECTIVE: BP 117/69 mmHg  Pulse 71  Ht 5' 3.5" (1.613 m)  Wt 152 lb 1.6 oz (68.992 kg)  BMI 26.52 kg/m2 ABDOMEN: Soft, non distended; Non tender. No Organomegaly. PELVIC: External Genitalia: Normal BUS: Normal Vagina: Normal Mucosa; cystocele present Cervix: Parous; No cervical motion tenderness Uterus: Normal size, shape,consistency, mobile RV: Normal External exam Bladder: Nontender  PROCEDURE:  Gellhorn Pessary is removed, cleaned, and reinserted.  ASSESSMENT: 1.  Procidentia. 2.  Traction cystocele. 3.  Normal pessary maintenance evaluation.  PLAN: 1.  Return in 4 months for pessary maintenance. 2.  Continue with Estrace cream once or twice a week.  Intravaginal 3.  Continue with Trimo San gel weekly  A total of 15 minutes were spent face-to-face with the patient during this encounter and over half of that time dealt with counseling and coordination of care.  Brayton Mars, MD  Note: This dictation was prepared with Dragon dictation along with smaller phrase technology. Any transcriptional errors that result from this process are unintentional.

## 2015-01-22 NOTE — Patient Instructions (Signed)
1.  Continue with pessary care as instructed.  Exam is normal today. 2.  Return in 4 months for pessary maintenance.

## 2015-01-29 ENCOUNTER — Ambulatory Visit
Admission: RE | Admit: 2015-01-29 | Discharge: 2015-01-29 | Disposition: A | Discharge status: Home / Self Care | Payer: PPO | Source: Ambulatory Visit | Attending: Family Medicine | Admitting: Family Medicine

## 2015-01-29 ENCOUNTER — Encounter: Payer: Self-pay | Admitting: Family Medicine

## 2015-01-29 ENCOUNTER — Ambulatory Visit (INDEPENDENT_AMBULATORY_CARE_PROVIDER_SITE_OTHER): Payer: PPO | Admitting: Family Medicine

## 2015-01-29 VITALS — BP 122/64 | HR 68 | Temp 98.5°F | Resp 16 | Ht 63.6 in | Wt 152.0 lb

## 2015-01-29 DIAGNOSIS — Z23 Encounter for immunization: Secondary | ICD-10-CM

## 2015-01-29 DIAGNOSIS — M25572 Pain in left ankle and joints of left foot: Secondary | ICD-10-CM | POA: Diagnosis not present

## 2015-01-29 DIAGNOSIS — E119 Type 2 diabetes mellitus without complications: Secondary | ICD-10-CM

## 2015-01-29 LAB — POCT GLYCOSYLATED HEMOGLOBIN (HGB A1C)
Est. average glucose Bld gHb Est-mCnc: 169
Hemoglobin A1C: 7.5

## 2015-01-29 NOTE — Progress Notes (Signed)
Advised  ED 
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
## 2015-01-29 NOTE — Patient Instructions (Addendum)
Your HgbA1c today is 7.5%  Go to [facility] on [address] for [place] of ankle.

## 2015-03-19 ENCOUNTER — Ambulatory Visit: Payer: Self-pay | Admitting: Family Medicine

## 2015-04-16 DIAGNOSIS — E7800 Pure hypercholesterolemia, unspecified: Secondary | ICD-10-CM | POA: Diagnosis not present

## 2015-04-16 DIAGNOSIS — I10 Essential (primary) hypertension: Secondary | ICD-10-CM | POA: Diagnosis not present

## 2015-04-16 DIAGNOSIS — F5101 Primary insomnia: Secondary | ICD-10-CM | POA: Diagnosis not present

## 2015-04-16 DIAGNOSIS — E119 Type 2 diabetes mellitus without complications: Secondary | ICD-10-CM | POA: Diagnosis not present

## 2015-04-16 DIAGNOSIS — I2510 Atherosclerotic heart disease of native coronary artery without angina pectoris: Secondary | ICD-10-CM | POA: Diagnosis not present

## 2015-04-17 ENCOUNTER — Other Ambulatory Visit: Payer: Self-pay | Admitting: Family Medicine

## 2015-04-21 ENCOUNTER — Other Ambulatory Visit: Payer: Self-pay | Admitting: Family Medicine

## 2015-05-21 ENCOUNTER — Encounter: Payer: Self-pay | Admitting: Obstetrics and Gynecology

## 2015-05-21 ENCOUNTER — Ambulatory Visit (INDEPENDENT_AMBULATORY_CARE_PROVIDER_SITE_OTHER): Payer: PPO | Admitting: Obstetrics and Gynecology

## 2015-05-21 VITALS — BP 112/72 | HR 70 | Ht 63.5 in | Wt 147.0 lb

## 2015-05-21 DIAGNOSIS — IMO0002 Reserved for concepts with insufficient information to code with codable children: Secondary | ICD-10-CM

## 2015-05-21 DIAGNOSIS — N8110 Cystocele, unspecified: Secondary | ICD-10-CM

## 2015-05-21 DIAGNOSIS — N813 Complete uterovaginal prolapse: Secondary | ICD-10-CM

## 2015-05-21 DIAGNOSIS — Z4689 Encounter for fitting and adjustment of other specified devices: Secondary | ICD-10-CM

## 2015-05-21 NOTE — Patient Instructions (Signed)
1. Return in 4 months for pessary maintenance 2. Continue using intravaginal estrogen cream and Trimosan gel weekly

## 2015-05-21 NOTE — Progress Notes (Signed)
Chief complaint: 1. Traction cystocele 2. Procidentia  4 month pessary check- gellhorn NO vag bleeding or pain Estrace cream- once a week- or so trimosan gel- weekly  Past medical history, past surgical history, problems, medications, and allergies are reviewed  OBJECTIVE: BP 112/72 mmHg  Pulse 70  Ht 5' 3.5" (1.613 m)  Wt 147 lb (66.679 kg)  BMI 25.63 kg/m2  ABDOMEN: Soft, non distended; Non tender. No Organomegaly. PELVIC: External Genitalia: Normal BUS: Normal Vagina: Normal Mucosa; cystocele present Cervix: Parous; No cervical motion tenderness; mild ecchymoses on cervix Uterus: Normal size, shape,consistency, mobile RV: Normal External exam Bladder: Nontender  PROCEDURE: Gellhorn Pessary is removed, cleaned, and reinserted.  ASSESSMENT: 1. Procidentia. 2. Traction cystocele. 3. Normal pessary maintenance evaluation  PLAN: 1. Return in 4 months for pessary maintenance. 2. Continue with Estrace cream once or twice a week. Intravaginal 3. Continue with Trimo San gel weekly  A total of 15 minutes were spent face-to-face with the patient during this encounter and over half of that time dealt with counseling and coordination of care.  Brayton Mars, MD  Note: This dictation was prepared with Dragon dictation along with smaller phrase technology. Any transcriptional errors that result from this process are unintentional.

## 2015-06-11 ENCOUNTER — Encounter: Payer: Self-pay | Admitting: Family Medicine

## 2015-06-11 ENCOUNTER — Ambulatory Visit (INDEPENDENT_AMBULATORY_CARE_PROVIDER_SITE_OTHER): Payer: PPO | Admitting: Family Medicine

## 2015-06-11 VITALS — BP 100/56 | HR 78 | Temp 98.4°F | Resp 16 | Wt 149.0 lb

## 2015-06-11 DIAGNOSIS — E1165 Type 2 diabetes mellitus with hyperglycemia: Secondary | ICD-10-CM

## 2015-06-11 DIAGNOSIS — I10 Essential (primary) hypertension: Secondary | ICD-10-CM | POA: Diagnosis not present

## 2015-06-11 DIAGNOSIS — I2510 Atherosclerotic heart disease of native coronary artery without angina pectoris: Secondary | ICD-10-CM | POA: Diagnosis not present

## 2015-06-11 DIAGNOSIS — IMO0001 Reserved for inherently not codable concepts without codable children: Secondary | ICD-10-CM

## 2015-06-11 LAB — POCT GLYCOSYLATED HEMOGLOBIN (HGB A1C)
Est. average glucose Bld gHb Est-mCnc: 180
Hemoglobin A1C: 7.9

## 2015-06-11 LAB — POCT UA - MICROALBUMIN: MICROALBUMIN (UR) POC: 20 mg/L

## 2015-06-11 NOTE — Progress Notes (Signed)
Patient: April Pittman Female    DOB: December 02, 1940   75 y.o.   MRN: [id number] Visit Date: 06/11/2015  Today's Provider: [name], MD   Chief Complaint  Patient presents with  . Diabetes    follow up  . Hypertension    follow up  . Hypothyroidism    follow up   Subjective:    HPI   Diabetes Mellitus Type II, Follow-up:   Lab Results  Component Value Date   HGBA1C 7.5 01/29/2015   HGBA1C 7.8* 11/11/2014   HGBA1C 6.6* 04/29/2014    Last seen for diabetes 4 months ago.  Management since then includes no changes. She reports good compliance with treatment. She is not having side effects.  Current symptoms include none and have been stable. Home blood sugar records: fasting range: 150  Episodes of hypoglycemia? no   Current Insulin Regimen: none Most Recent Eye Exam: 1 year ago Weight trend: stable Prior visit with dietician: no Current diet: in general, a "healthy" diet   Current exercise: cardiovascular workout on exercise equipment tredmill  Pertinent Labs:    Component Value Date/Time   CHOL 176 11/11/2014 1029   CHOL 165 11/07/2013   TRIG 239* 11/11/2014 1029   HDL 58 11/11/2014 1029   HDL 63 11/07/2013   LDLCALC 70 11/11/2014 1029   LDLCALC 60 11/07/2013   CREATININE 0.63 11/11/2014 1029   CREATININE 0.6 04/29/2014    Wt Readings from Last 3 Encounters:  05/21/15 147 lb (66.679 kg)  01/29/15 152 lb (68.947 kg)  01/22/15 152 lb 1.6 oz (68.992 kg)    ------------------------------------------------------------------------   Hypertension, follow-up:  BP Readings from Last 3 Encounters:  05/21/15 112/72  01/29/15 122/64  01/22/15 117/69    She was last seen for hypertension 7 months ago.  BP at that visit was 140/90. Management since that visit includes no changes. She reports good compliance with treatment. She is not having side effects.  She is exercising. She is adherent to low salt diet.   Outside blood pressures are  not being checked. She is experiencing lower extremity edema.  Patient denies chest pain, chest pressure/discomfort, claudication, dyspnea, exertional chest pressure/discomfort, irregular heart beat, near-syncope, orthopnea and palpitations.   Cardiovascular risk factors include advanced age (older than 30 for men, 83 for women) and hypertension.  Use of agents associated with hypertension: NSAIDS.     Weight trend: stable Wt Readings from Last 3 Encounters:  05/21/15 147 lb (66.679 kg)  01/29/15 152 lb (68.947 kg)  01/22/15 152 lb 1.6 oz (68.992 kg)    Current diet: in general, a "healthy" diet    ------------------------------------------------------------------------  Follow up Hypothyroidism:  Last office visit was 7 months ago and no changes were made. Patient reports good compliance with treatment and good tolerance.     No Known Allergies Previous Medications   AMLODIPINE (NORVASC) 5 MG TABLET    TAKE 1 TABLET (5 MG TOTAL) BY MOUTH DAILY.   ASPIRIN EC 81 MG TABLET    Take 81 mg by mouth daily.   BLOOD GLUCOSE MONITORING SUPPL (ONE TOUCH ULTRA MINI) W/DEVICE KIT       CHOLECALCIFEROL (VITAMIN D) 400 UNITS TABS TABLET    Take 1,000 Units by mouth daily.   CLOPIDOGREL (PLAVIX) 75 MG TABLET    Take 75 mg by mouth daily.   CYANOCOBALAMIN (RA VITAMIN B-12 TR) 1000 MCG TBCR    Take by mouth.   ESTRADIOL (ESTRACE) 0.1 MG/GM  VAGINAL CREAM    Place 1 Applicatorful vaginally once a week.   GLUCOSE BLOOD (ONE TOUCH ULTRA TEST) TEST STRIP       LEVOTHYROXINE (SYNTHROID, LEVOTHROID) 125 MCG TABLET    TAKE ONE TABLET BY MOUTH IN THE MORNING ON AN EMPTY STOMACH FOR THYROID   LISINOPRIL-HYDROCHLOROTHIAZIDE (PRINZIDE,ZESTORETIC) 20-25 MG TABLET    TAKE ONE TABLET BY MOUTH ONCE DAILY FOR BLOOD PRESSURE   MELATONIN 3 MG TABS    Take by mouth.   MELOXICAM (MOBIC) 7.5 MG TABLET    Take by mouth.   METFORMIN (GLUCOPHAGE) 1000 MG TABLET    TAKE ONE TABLET BY MOUTH TWICE DAILY   METOPROLOL  (LOPRESSOR) 50 MG TABLET    Take 50 mg by mouth daily.   ONETOUCH DELICA LANCETS FINE MISC       OXYQUINOLONE SULFATE VAGINAL 0.025 % GEL    Place 1 application vaginally once a week.   SIMVASTATIN (ZOCOR) 40 MG TABLET    TAKE 1 TABLET BY MOUTH EVERY DAY FOR CHOLESTEROL   TIZANIDINE (ZANAFLEX) 4 MG CAPSULE    Take 4 mg by mouth 4 (four) times daily as needed for muscle spasms.    Review of Systems  Constitutional: Negative for fever, chills, appetite change and fatigue.  Respiratory: Negative for chest tightness and shortness of breath.   Cardiovascular: Negative for chest pain and palpitations.  Gastrointestinal: Negative for nausea, vomiting and abdominal pain.  Musculoskeletal: Positive for joint swelling (right knee) and arthralgias (right knee pain).  Neurological: Negative for dizziness and weakness.  Psychiatric/Behavioral: Positive for sleep disturbance.    Social History  Substance Use Topics  . Smoking status: Never Smoker   . Smokeless tobacco: Never Used  . Alcohol Use: No   Objective:   BP 100/56 mmHg  Pulse 78  Temp(Src) 98.4 F (36.9 C) (Oral)  Resp 16  Wt 149 lb (67.586 kg)  Physical Exam   General Appearance:    Alert, cooperative, no distress  Eyes:    PERRL, conjunctiva/corneas clear, EOM's intact       Lungs:     Clear to auscultation bilaterally, respirations unlabored  Heart:    Regular rate and rhythm  Neurologic:   Awake, alert, oriented x 3. No apparent focal neurological           defect.        Lab Results  Component Value Date   HGBA1C 7.9 06/11/2015      Assessment & Plan:     1. Type 2 diabetes mellitus without complication, without long-term current use of insulin (HCC)  - POCT HgB A1C - POCT UA - Microalbumin  2. Essential hypertension Well controlled.  Continue current medications.    3. Coronary artery disease involving native coronary artery of native heart without angina pectoris Asymptomatic. Compliant with medication.   Continue aggressive risk factor modification.  Discusses adding Jardiance, but she prefers not to add additional medications, and feels she can make significant improvements in her diet. Advised to get 30 minutes of walking or other exercise in every day.        [name], MD  [facility]

## 2015-09-11 ENCOUNTER — Telehealth: Payer: Self-pay | Admitting: Family Medicine

## 2015-09-11 NOTE — Telephone Encounter (Signed)
Please advise 

## 2015-09-11 NOTE — Telephone Encounter (Signed)
Patient was notified. Scheduled inj appt for 09/15/15 at 8:45.

## 2015-09-11 NOTE — Telephone Encounter (Signed)
Pt calling wanting to know if she can make appointment to get a shingles shot.  Pt states her daughter was diagnosed with shingles. Please call pt back on her cell phone [phone] or [phone] house phone. Thanks CC

## 2015-09-11 NOTE — Telephone Encounter (Signed)
That's fine, but i suggest she check her insurance coverage, it is an expensive vaccine.

## 2015-09-15 ENCOUNTER — Ambulatory Visit (INDEPENDENT_AMBULATORY_CARE_PROVIDER_SITE_OTHER): Payer: PPO | Admitting: Family Medicine

## 2015-09-15 DIAGNOSIS — Z23 Encounter for immunization: Secondary | ICD-10-CM | POA: Diagnosis not present

## 2015-09-17 ENCOUNTER — Other Ambulatory Visit: Payer: Self-pay | Admitting: Family Medicine

## 2015-09-25 ENCOUNTER — Ambulatory Visit: Payer: PPO | Admitting: Obstetrics and Gynecology

## 2015-09-29 ENCOUNTER — Ambulatory Visit (INDEPENDENT_AMBULATORY_CARE_PROVIDER_SITE_OTHER): Payer: PPO | Admitting: Family Medicine

## 2015-09-29 ENCOUNTER — Encounter: Payer: Self-pay | Admitting: Family Medicine

## 2015-09-29 VITALS — BP 106/58 | HR 60 | Temp 97.9°F | Resp 16 | Wt 151.0 lb

## 2015-09-29 DIAGNOSIS — G4700 Insomnia, unspecified: Secondary | ICD-10-CM | POA: Diagnosis not present

## 2015-09-29 DIAGNOSIS — E7800 Pure hypercholesterolemia, unspecified: Secondary | ICD-10-CM | POA: Diagnosis not present

## 2015-09-29 DIAGNOSIS — I2510 Atherosclerotic heart disease of native coronary artery without angina pectoris: Secondary | ICD-10-CM | POA: Diagnosis not present

## 2015-09-29 DIAGNOSIS — E039 Hypothyroidism, unspecified: Secondary | ICD-10-CM

## 2015-09-29 DIAGNOSIS — E559 Vitamin D deficiency, unspecified: Secondary | ICD-10-CM | POA: Diagnosis not present

## 2015-09-29 DIAGNOSIS — E119 Type 2 diabetes mellitus without complications: Secondary | ICD-10-CM

## 2015-09-29 DIAGNOSIS — I10 Essential (primary) hypertension: Secondary | ICD-10-CM

## 2015-09-29 LAB — POCT GLYCOSYLATED HEMOGLOBIN (HGB A1C): Hemoglobin A1C: 7.9

## 2015-09-29 MED ORDER — NITROGLYCERIN 0.4 MG SL SUBL: 0.4000 mg | SUBLINGUAL_TABLET | SUBLINGUAL | 0 refills | Status: DC | PRN

## 2015-09-29 MED ORDER — TRAZODONE HCL 100 MG PO TABS: 100.0000 mg | ORAL_TABLET | Freq: Every day | ORAL | 2 refills | Status: DC

## 2015-09-29 MED ORDER — GLUCOSE BLOOD VI STRP: ORAL_STRIP | 3 refills | Status: DC

## 2015-09-29 NOTE — Progress Notes (Signed)
Patient: April Pittman Female    DOB: Dec 23, 1940   75 y.o.   MRN: [id number] Visit Date: 09/29/2015  Today's Provider: [name], MD   Chief Complaint  Patient presents with  . Hyperlipidemia  . Hypertension  . Hypothyroidism  . Diabetes   Subjective:    HPI    Diabetes Mellitus Type II, Follow-up:   Lab Results  Component Value Date   HGBA1C 7.9 06/11/2015   HGBA1C 7.5 01/29/2015   HGBA1C 7.8 (H) 11/11/2014   Last seen for diabetes 3 months ago.  Management since then includes None. She reports excellent compliance with treatment. She is not having side effects.  Current symptoms include none and have been stable. Home blood sugar records: Pt reports not checking her blood sugar much.  Episodes of hypoglycemia? no   Most Recent Eye Exam: within the last year by patient report.  Weight trend: stable Current diet: in general, a "healthy" diet   Current exercise: none  ------------------------------------------------------------------------   Hypertension, follow-up:  BP Readings from Last 3 Encounters:  09/29/15 (!) 106/58  06/11/15 (!) 100/56  05/21/15 112/72    She was last seen for hypertension 3 months ago.  Management since that visit includes None.She reports excellent compliance with treatment. She is not having side effects.  She is not exercising. Secondary to knee pain from a fall, two years ago. She is adherent to low salt diet.   Outside blood pressures are: pt reports she is not checking her blood sugar. She is experiencing none.  Patient denies chest pain, irregular heart beat and lower extremity edema.   Cardiovascular risk factors include advanced age (older than 20 for men, 4 for women), diabetes mellitus, dyslipidemia and hypertension.    ------------------------------------------------------------------------    Lipid/Cholesterol, Follow-up:   Last seen for this 3 months ago.  Management since that visit includes  None.  Last Lipid Panel:    Component Value Date/Time   CHOL 176 11/11/2014 1029   TRIG 239 (H) 11/11/2014 1029   HDL 58 11/11/2014 1029   CHOLHDL 3.0 11/11/2014 1029   LDLCALC 70 11/11/2014 1029    She reports excellent compliance with treatment. She is not having side effects.   Wt Readings from Last 3 Encounters:  09/29/15 151 lb (68.5 kg)  06/11/15 149 lb (67.6 kg)  05/21/15 147 lb (66.7 kg)    ------------------------------------------------------------------------  Insomnia States has been having a lot of trouble sleeping, can't fall asleep, doses off and on throughout the night, but usually only gets a few hours of sleep each nigh.      No Known Allergies Current Meds  Medication Sig  . amLODipine (NORVASC) 5 MG tablet TAKE 1 TABLET (5 MG TOTAL) BY MOUTH DAILY.  Marland Kitchen aspirin EC 81 MG tablet Take 81 mg by mouth daily.  . Blood Glucose Monitoring Suppl (ONE TOUCH ULTRA MINI) W/DEVICE KIT   . clopidogrel (PLAVIX) 75 MG tablet Take 75 mg by mouth daily.  Marland Kitchen estradiol (ESTRACE) 0.1 MG/GM vaginal cream Place 1 Applicatorful vaginally once a week.  Marland Kitchen glucose blood (ONE TOUCH ULTRA TEST) test strip   . levothyroxine (SYNTHROID, LEVOTHROID) 125 MCG tablet TAKE ONE TABLET BY MOUTH IN THE MORNING ON AN EMPTY STOMACH FOR THYROID  . lisinopril-hydrochlorothiazide (PRINZIDE,ZESTORETIC) 20-25 MG tablet TAKE ONE TABLET BY MOUTH ONCE DAILY FOR BLOOD PRESSURE  . metFORMIN (GLUCOPHAGE) 1000 MG tablet TAKE ONE TABLET BY MOUTH TWICE DAILY  . metoprolol (LOPRESSOR) 50 MG tablet Take  50 mg by mouth daily.  [name] DELICA LANCETS FINE MISC   . OXYQUINOLONE SULFATE VAGINAL 0.025 % GEL Place 1 application vaginally once a week.  . simvastatin (ZOCOR) 40 MG tablet TAKE 1 TABLET BY MOUTH EVERY DAY FOR CHOLESTEROL    Review of Systems  Constitutional: Positive for fatigue (Secondary to not being able to sleep.). Negative for activity change, appetite change, chills, diaphoresis, fever and  unexpected weight change.  Respiratory: Negative.   Cardiovascular: Negative.   Gastrointestinal: Negative.   Musculoskeletal: Positive for arthralgias (Right knee pain.).  Neurological: Negative for dizziness, light-headedness, numbness and headaches.    Social History  Substance Use Topics  . Smoking status: Never Smoker  . Smokeless tobacco: Never Used  . Alcohol use No   Objective:   BP (!) 106/58 (BP Location: Left Arm, Patient Position: Sitting, Cuff Size: Normal)   Pulse 60   Temp 97.9 F (36.6 C) (Oral)   Resp 16   Wt 151 lb (68.5 kg)   BMI 26.33 kg/m    Physical Exam  General Appearance:    Alert, cooperative, no distress  Eyes:    PERRL, conjunctiva/corneas clear, EOM's intact       Lungs:     Clear to auscultation bilaterally, respirations unlabored  Heart:    Regular rate and rhythm  Neurologic:   Awake, alert, oriented x 3. No apparent focal neurological           defect.         Results for orders placed or performed in visit on 09/29/15  POCT glycosylated hemoglobin (Hb A1C)  Result Value Ref Range   Hemoglobin A1C 7.9        Assessment & Plan:     1. Essential hypertension Well controlled.  Continue current medications.   - Renal function panel  2. Hypothyroidism, unspecified hypothyroidism type May be affecting sleep habits. Check labs.  - T4 AND TSH  3. Type 2 diabetes mellitus without complication, without long-term current use of insulin (HCC) Stable. Continue current medications.   - POCT glycosylated hemoglobin (Hb A1C) - Renal function panel  4. Hypercholesteremia She is tolerating simvastatin well with no adverse effects.    - Lipid panel - Hepatic function panel  5. Coronary artery disease involving native coronary artery of native heart without angina pectoris Asymptomatic. Compliant with medication.  Continue aggressive risk factor modification.  Her [facility] prescription has expired and needs refill.  - CBC  6. Vitamin D  deficiency  - VITAMIN D 25 Hydroxy (Vit-D Deficiency, Fractures)  7. Insomnia Try trazodone.  - traZODone (DESYREL) 100 MG tablet; Take 1 tablet (100 mg total) by mouth at bedtime.  Dispense: 30 tablet; Refill: 2  Return in November for CPE and A1c.      [name], MD  [facility]  The entirety of the information documented in the History of Present Illness, Review of Systems and Physical Exam were personally obtained by me. Portions of this information were initially documented by Ashley Royalty, CMA and reviewed by me for thoroughness and accuracy.

## 2015-09-30 ENCOUNTER — Telehealth: Payer: Self-pay

## 2015-09-30 LAB — HEPATIC FUNCTION PANEL
ALT: 13 IU/L (ref 0–32)
AST: 18 IU/L (ref 0–40)
Alkaline Phosphatase: 89 IU/L (ref 39–117)
BILIRUBIN TOTAL: 0.3 mg/dL (ref 0.0–1.2)
Bilirubin, Direct: 0.1 mg/dL (ref 0.00–0.40)
TOTAL PROTEIN: 7.5 g/dL (ref 6.0–8.5)

## 2015-09-30 LAB — LIPID PANEL
CHOL/HDL RATIO: 2.9 ratio (ref 0.0–4.4)
Cholesterol, Total: 144 mg/dL (ref 100–199)
HDL: 50 mg/dL (ref >39)
LDL CALC: 56 mg/dL (ref 0–99)
Triglycerides: 191 mg/dL — ABNORMAL HIGH (ref 0–149)
VLDL CHOLESTEROL CAL: 38 mg/dL (ref 5–40)

## 2015-09-30 LAB — RENAL FUNCTION PANEL
Albumin: 4.4 g/dL (ref 3.5–4.8)
BUN / CREAT RATIO: 18 (ref 12–28)
BUN: 11 mg/dL (ref 8–27)
CO2: 27 mmol/L (ref 18–29)
Calcium: 9.9 mg/dL (ref 8.7–10.3)
Chloride: 96 mmol/L (ref 96–106)
Creatinine, Ser: 0.6 mg/dL (ref 0.57–1.00)
GFR calc non Af Amer: 90 mL/min/{1.73_m2} (ref >59)
GFR, EST AFRICAN AMERICAN: 104 mL/min/{1.73_m2} (ref >59)
GLUCOSE: 119 mg/dL — AB (ref 65–99)
POTASSIUM: 5.2 mmol/L (ref 3.5–5.2)
Phosphorus: 3.7 mg/dL (ref 2.5–4.5)
SODIUM: 138 mmol/L (ref 134–144)

## 2015-09-30 LAB — CBC
HEMATOCRIT: 34.2 % (ref 34.0–46.6)
HEMOGLOBIN: 10.6 g/dL — AB (ref 11.1–15.9)
MCH: 24.4 pg — AB (ref 26.6–33.0)
MCHC: 31 g/dL — AB (ref 31.5–35.7)
MCV: 79 fL (ref 79–97)
Platelets: 431 10*3/uL — ABNORMAL HIGH (ref 150–379)
RBC: 4.34 x10E6/uL (ref 3.77–5.28)
RDW: 14.3 % (ref 12.3–15.4)
WBC: 10.5 10*3/uL (ref 3.4–10.8)

## 2015-09-30 LAB — T4 AND TSH
T4, Total: 8.8 ug/dL (ref 4.5–12.0)
TSH: 2.37 u[IU]/mL (ref 0.450–4.500)

## 2015-09-30 LAB — VITAMIN D 25 HYDROXY (VIT D DEFICIENCY, FRACTURES): Vit D, 25-Hydroxy: 26.7 ng/mL — ABNORMAL LOW (ref 30.0–100.0)

## 2015-09-30 NOTE — Telephone Encounter (Signed)
-----   Message from Birdie Sons, MD sent at 09/30/2015  7:41 AM EDT ----- Labs are all good. Continue current medications.  Follow up in November as scheduled.

## 2015-09-30 NOTE — Telephone Encounter (Signed)
Pt advised.   Thanks,   -April Pittman  

## 2015-10-02 ENCOUNTER — Ambulatory Visit: Payer: PPO | Admitting: Obstetrics and Gynecology

## 2015-10-07 ENCOUNTER — Other Ambulatory Visit: Payer: Self-pay | Admitting: Family Medicine

## 2015-10-07 DIAGNOSIS — Z1231 Encounter for screening mammogram for malignant neoplasm of breast: Secondary | ICD-10-CM

## 2015-10-09 ENCOUNTER — Encounter: Payer: Self-pay | Admitting: Obstetrics and Gynecology

## 2015-10-09 ENCOUNTER — Ambulatory Visit (INDEPENDENT_AMBULATORY_CARE_PROVIDER_SITE_OTHER): Payer: PPO | Admitting: Obstetrics and Gynecology

## 2015-10-09 VITALS — BP 136/70 | HR 80 | Ht 63.0 in | Wt 150.3 lb

## 2015-10-09 DIAGNOSIS — IMO0002 Reserved for concepts with insufficient information to code with codable children: Secondary | ICD-10-CM

## 2015-10-09 DIAGNOSIS — N813 Complete uterovaginal prolapse: Secondary | ICD-10-CM

## 2015-10-09 DIAGNOSIS — N8110 Cystocele, unspecified: Secondary | ICD-10-CM | POA: Diagnosis not present

## 2015-10-09 DIAGNOSIS — Z4689 Encounter for fitting and adjustment of other specified devices: Secondary | ICD-10-CM

## 2015-10-09 NOTE — Progress Notes (Signed)
Chief complaint: 1. Pessary maintenance 2. Procidentia 3. Cystocele  Patient presents for 4 month pessary check. She is several weeks overdue. She is not experiencing any difficulties. She is capable of removing and reinserting the pessary (gel horn). No vaginal discharge, vaginal bleeding, or pelvic pain. Bowel and bladder function are normal. She is using Estrace cream once a week. She is using Trimosan gel weekly  Past medical history, past surgical history, problem list, medications, and allergies are reviewed  OBJECTIVE: BP 136/70   Pulse 80   Ht 5\' 3"  (1.6 m)   Wt 150 lb 4.8 oz (68.2 kg)   BMI 26.62 kg/m  Pleasant female in no acute distress PELVIC: External Genitalia: Normal BUS: Normal Vagina: Normal Mucosa; cystocele present Cervix: Parous; No cervical motion tenderness; 1 cm cervical polyp at 5:00 on ectocervix Uterus: Normal size, shape,consistency, mobile RV: Normal External exam Bladder: Nontender  PROCEDURE: Gellhorn Pessary is removed, cleaned, and reinserted.  ASSESSMENT: 1. Procidentia. 2. Traction cystocele. 3. Normal pessary maintenance evaluation  PLAN: 1. Return in 4 months for pessary maintenance. 2. Continue with Estrace cream once or twice a week. Intravaginal 3. Continue with Trimo San gel weekly  A total of 15 minutes were spent face-to-face with the patient during this encounter and over half of that time dealt with counseling and coordination of care.  Brayton Mars, MD  Note: This dictation was prepared with Dragon dictation along with smaller phrase technology. Any transcriptional errors that result from this process are unintentional.

## 2015-10-14 DIAGNOSIS — E119 Type 2 diabetes mellitus without complications: Secondary | ICD-10-CM | POA: Diagnosis not present

## 2015-10-14 DIAGNOSIS — I10 Essential (primary) hypertension: Secondary | ICD-10-CM | POA: Diagnosis not present

## 2015-10-14 DIAGNOSIS — E7800 Pure hypercholesterolemia, unspecified: Secondary | ICD-10-CM | POA: Diagnosis not present

## 2015-10-14 DIAGNOSIS — I2510 Atherosclerotic heart disease of native coronary artery without angina pectoris: Secondary | ICD-10-CM | POA: Diagnosis not present

## 2015-10-21 ENCOUNTER — Telehealth: Payer: Self-pay | Admitting: Family Medicine

## 2015-10-21 NOTE — Telephone Encounter (Signed)
Juliann Pulse with [employer] would someone to fax the Dx code for pt getting the shingles vaccine on 09/29/15 and also needs the NCD#. Fax# [phone]. Please advise. Thanks TNP

## 2015-10-22 NOTE — Telephone Encounter (Signed)
Please advise 

## 2015-10-22 NOTE — Telephone Encounter (Signed)
Information requested was faxed

## 2015-10-28 DIAGNOSIS — M1711 Unilateral primary osteoarthritis, right knee: Secondary | ICD-10-CM | POA: Diagnosis not present

## 2015-10-28 DIAGNOSIS — M25561 Pain in right knee: Secondary | ICD-10-CM | POA: Diagnosis not present

## 2015-10-28 DIAGNOSIS — G8929 Other chronic pain: Secondary | ICD-10-CM | POA: Diagnosis not present

## 2015-11-02 DIAGNOSIS — M1711 Unilateral primary osteoarthritis, right knee: Secondary | ICD-10-CM | POA: Insufficient documentation

## 2015-11-18 ENCOUNTER — Other Ambulatory Visit: Payer: Self-pay | Admitting: Family Medicine

## 2015-11-18 ENCOUNTER — Ambulatory Visit
Admission: RE | Admit: 2015-11-18 | Discharge: 2015-11-18 | Disposition: A | Discharge status: Home / Self Care | Payer: PPO | Source: Ambulatory Visit | Attending: Family Medicine | Admitting: Family Medicine

## 2015-11-18 DIAGNOSIS — R928 Other abnormal and inconclusive findings on diagnostic imaging of breast: Secondary | ICD-10-CM | POA: Insufficient documentation

## 2015-11-18 DIAGNOSIS — Z1231 Encounter for screening mammogram for malignant neoplasm of breast: Secondary | ICD-10-CM

## 2015-11-19 ENCOUNTER — Other Ambulatory Visit: Payer: Self-pay | Admitting: Family Medicine

## 2015-11-19 DIAGNOSIS — N6320 Unspecified lump in the left breast, unspecified quadrant: Secondary | ICD-10-CM

## 2015-11-26 DIAGNOSIS — H04123 Dry eye syndrome of bilateral lacrimal glands: Secondary | ICD-10-CM | POA: Diagnosis not present

## 2015-11-26 DIAGNOSIS — D2312 Other benign neoplasm of skin of left eyelid, including canthus: Secondary | ICD-10-CM | POA: Diagnosis not present

## 2015-11-26 DIAGNOSIS — E119 Type 2 diabetes mellitus without complications: Secondary | ICD-10-CM | POA: Diagnosis not present

## 2015-11-26 DIAGNOSIS — H02831 Dermatochalasis of right upper eyelid: Secondary | ICD-10-CM | POA: Diagnosis not present

## 2015-11-26 DIAGNOSIS — H26033 Infantile and juvenile nuclear cataract, bilateral: Secondary | ICD-10-CM | POA: Diagnosis not present

## 2015-11-26 DIAGNOSIS — H02834 Dermatochalasis of left upper eyelid: Secondary | ICD-10-CM | POA: Diagnosis not present

## 2015-11-26 DIAGNOSIS — H16223 Keratoconjunctivitis sicca, not specified as Sjogren's, bilateral: Secondary | ICD-10-CM | POA: Diagnosis not present

## 2015-11-26 DIAGNOSIS — D2311 Other benign neoplasm of skin of right eyelid, including canthus: Secondary | ICD-10-CM | POA: Diagnosis not present

## 2015-12-02 ENCOUNTER — Encounter: Payer: Self-pay | Admitting: Family Medicine

## 2015-12-06 ENCOUNTER — Emergency Department: Payer: PPO

## 2015-12-06 ENCOUNTER — Emergency Department
Admission: EM | Admit: 2015-12-06 | Discharge: 2015-12-06 | Disposition: A | Discharge status: Home / Self Care | Payer: PPO | Attending: Emergency Medicine | Admitting: Emergency Medicine

## 2015-12-06 ENCOUNTER — Encounter: Payer: Self-pay | Admitting: Emergency Medicine

## 2015-12-06 DIAGNOSIS — Z7982 Long term (current) use of aspirin: Secondary | ICD-10-CM | POA: Insufficient documentation

## 2015-12-06 DIAGNOSIS — S82091A Other fracture of right patella, initial encounter for closed fracture: Secondary | ICD-10-CM | POA: Insufficient documentation

## 2015-12-06 DIAGNOSIS — Z8673 Personal history of transient ischemic attack (TIA), and cerebral infarction without residual deficits: Secondary | ICD-10-CM | POA: Insufficient documentation

## 2015-12-06 DIAGNOSIS — Z7984 Long term (current) use of oral hypoglycemic drugs: Secondary | ICD-10-CM | POA: Insufficient documentation

## 2015-12-06 DIAGNOSIS — S0083XA Contusion of other part of head, initial encounter: Secondary | ICD-10-CM

## 2015-12-06 DIAGNOSIS — E039 Hypothyroidism, unspecified: Secondary | ICD-10-CM | POA: Diagnosis not present

## 2015-12-06 DIAGNOSIS — I10 Essential (primary) hypertension: Secondary | ICD-10-CM | POA: Insufficient documentation

## 2015-12-06 DIAGNOSIS — E119 Type 2 diabetes mellitus without complications: Secondary | ICD-10-CM | POA: Diagnosis not present

## 2015-12-06 DIAGNOSIS — S0990XA Unspecified injury of head, initial encounter: Secondary | ICD-10-CM | POA: Diagnosis not present

## 2015-12-06 DIAGNOSIS — Z85828 Personal history of other malignant neoplasm of skin: Secondary | ICD-10-CM | POA: Diagnosis not present

## 2015-12-06 DIAGNOSIS — Y999 Unspecified external cause status: Secondary | ICD-10-CM | POA: Insufficient documentation

## 2015-12-06 DIAGNOSIS — I2510 Atherosclerotic heart disease of native coronary artery without angina pectoris: Secondary | ICD-10-CM | POA: Insufficient documentation

## 2015-12-06 DIAGNOSIS — W01198A Fall on same level from slipping, tripping and stumbling with subsequent striking against other object, initial encounter: Secondary | ICD-10-CM | POA: Insufficient documentation

## 2015-12-06 DIAGNOSIS — Y92488 Other paved roadways as the place of occurrence of the external cause: Secondary | ICD-10-CM | POA: Diagnosis not present

## 2015-12-06 DIAGNOSIS — Z79899 Other long term (current) drug therapy: Secondary | ICD-10-CM | POA: Insufficient documentation

## 2015-12-06 DIAGNOSIS — Y9301 Activity, walking, marching and hiking: Secondary | ICD-10-CM | POA: Diagnosis not present

## 2015-12-06 DIAGNOSIS — R51 Headache: Secondary | ICD-10-CM | POA: Diagnosis not present

## 2015-12-06 DIAGNOSIS — M25561 Pain in right knee: Secondary | ICD-10-CM | POA: Diagnosis not present

## 2015-12-06 DIAGNOSIS — S82001A Unspecified fracture of right patella, initial encounter for closed fracture: Secondary | ICD-10-CM

## 2015-12-06 MED ORDER — ONDANSETRON 4 MG PO TBDP
ORAL_TABLET | ORAL | Status: AC
  Administered 2015-12-06: 4 mg via ORAL
  Filled 2015-12-06: qty 1

## 2015-12-06 MED ORDER — TRAMADOL HCL 50 MG PO TABS: ORAL_TABLET | ORAL | 0 refills | Status: DC

## 2015-12-06 MED ORDER — ONDANSETRON 4 MG PO TBDP: ORAL_TABLET | ORAL | 0 refills | Status: DC

## 2015-12-06 MED ORDER — ONDANSETRON 4 MG PO TBDP
4.0000 mg | ORAL_TABLET | Freq: Once | ORAL | Status: AC
  Administered 2015-12-06: 4 mg via ORAL

## 2015-12-06 MED ORDER — TRAMADOL HCL 50 MG PO TABS
100.0000 mg | ORAL_TABLET | Freq: Once | ORAL | Status: AC
  Administered 2015-12-06: 100 mg via ORAL
  Filled 2015-12-06: qty 2

## 2015-12-06 NOTE — ED Provider Notes (Signed)
[facility] Emergency Department Provider Note  ____________________________________________   First MD Initiated Contact with Patient 12/06/15 [id number]     (approximate)  I have reviewed the triage vital signs and the nursing notes.   HISTORY  Chief Complaint Head Injury and Knee Pain    HPI April Pittman is a 75 y.o. female who presents with right knee pain and a contusion on her forehead after a mechanical fall.  She was walking across a street with several members of her family when she stumbled over a rough patch of pavement and fell down, striking both her right forehead and her right knee.  She reports moderate sharp and aching right knee pain, worse with any attempt at movement or weight bearing, which was acute in onset and persistent.  She has a dull aching mild to moderate pain in her head.  Denies neck pain, back pain, and any other extremity pain.  No abrasions nor lacerations.  Takes Plavix and aspirin.  Fully alert and oriented, no LOC due to head injury.  Denies nausea, vomiting, chest pain, SOB, abd pain.     Past Medical History:  Diagnosis Date  . Alopecia   . Benign neoplasm of large bowel   . CAD (coronary artery disease)   . Carpal tunnel syndrome   . DM (diabetes mellitus) ([place])   . Erosion of cervix   . GERD (gastroesophageal reflux disease)   . H/O adenomatous polyp of colon    with sever dysplasia, removed 2000 by colonoscopy  . History of chicken pox   . Hypercholesteremia   . Hypertension   . Hypothyroid   . Irritable bowel   . Malignant melanoma of foot ([place])   . Osteoporosis   . Post-menopausal bleeding   . Procidentia of uterus    with traction cystocele, managed with gellhorn pessary  . Rectal bleeding   . TIA (transient ischemic attack)     Patient Active Problem List   Diagnosis Date Noted  . Vitamin D deficiency 08/14/2014  . Rectal/anal hemorrhage 06/14/2014  . Postmenopausal vaginal bleeding 06/14/2014  .  Hypothyroidism 06/14/2014  . Hypertension 06/14/2014  . Hypercholesteremia 06/14/2014  . TIA (transient ischemic attack) 06/14/2014  . GERD (gastroesophageal reflux disease) 06/14/2014  . Alopecia 06/14/2014  . Malignant melanoma of foot ([place]) 06/14/2014  . Irritable bowel 06/14/2014  . Coronary artery disease 06/14/2014  . Diabetes mellitus, type 2 ([place]) 06/14/2014  . Carpal tunnel syndrome 06/14/2014  . Procidentia of uterus 06/14/2014  . Osteoporosis 06/14/2014  . History of adenomatous polyp of colon 06/14/2014  . B12 deficiency 08/08/2008  . Colon, diverticulosis 05/14/2008    Past Surgical History:  Procedure Laterality Date  . BREAST EXCISIONAL BIOPSY Right    Benign  . CARDIAC CATHETERIZATION  10/2010   +stenting to LAD and RCA; symptoms: chest pain, elevated Troponin, +AMI. [employer]  . COLONOSCOPY  03/2008   2 polyps, internal and external hemorrhoids, +anal growth. Dr. Vira Agar  . KNEE SURGERY    . TONSILLECTOMY     as a child    Prior to Admission medications   Medication Sig Start Date End Date Taking? Authorizing Provider  amLODipine (NORVASC) 5 MG tablet TAKE 1 TABLET (5 MG TOTAL) BY MOUTH DAILY. 09/17/15   Birdie Sons, MD  aspirin EC 81 MG tablet Take 81 mg by mouth daily. 03/20/14   Historical Provider, MD  Blood Glucose Monitoring Suppl (ONE TOUCH ULTRA MINI) W/DEVICE KIT  02/27/14  Historical Provider, MD  cholecalciferol (VITAMIN D) 400 UNITS TABS tablet Take 1,000 Units by mouth daily.    Historical Provider, MD  clopidogrel (PLAVIX) 75 MG tablet Take 75 mg by mouth daily. 03/20/14   Historical Provider, MD  estradiol (ESTRACE) 0.1 MG/GM vaginal cream Place 1 Applicatorful vaginally once a week. 03/20/14   Historical Provider, MD  glucose blood (ONE TOUCH ULTRA TEST) test strip Use to check blood sugar daily. E 11.9 (Type 2 diabetes mellitis) 09/29/15   Birdie Sons, MD  levothyroxine (SYNTHROID, LEVOTHROID) 125 MCG tablet TAKE ONE TABLET BY MOUTH IN  THE MORNING ON AN EMPTY STOMACH FOR THYROID 04/18/15   Birdie Sons, MD  lisinopril-hydrochlorothiazide (PRINZIDE,ZESTORETIC) 20-25 MG tablet TAKE ONE TABLET BY MOUTH ONCE DAILY FOR BLOOD PRESSURE 04/18/15   Birdie Sons, MD  metFORMIN (GLUCOPHAGE) 1000 MG tablet TAKE ONE TABLET BY MOUTH TWICE DAILY 04/18/15   Birdie Sons, MD  metoprolol (LOPRESSOR) 50 MG tablet Take 50 mg by mouth daily. 03/20/14   Historical Provider, MD  nitroGLYCERIN (NITROSTAT) 0.4 MG SL tablet Place 1 tablet (0.4 mg total) under the tongue every 5 (five) minutes as needed for chest pain. 09/29/15   Birdie Sons, MD  ondansetron (ZOFRAN ODT) 4 MG disintegrating tablet Allow 1-2 tablets to dissolve in your mouth every 8 hours as needed for nausea/vomiting 12/06/15   Hinda Kehr, MD  [place] LANCETS FINE MISC  02/27/14   Historical Provider, MD  OXYQUINOLONE SULFATE VAGINAL 0.025 % GEL Place 1 application vaginally once a week. 03/20/14   Historical Provider, MD  simvastatin (ZOCOR) 40 MG tablet TAKE 1 TABLET BY MOUTH EVERY DAY FOR CHOLESTEROL 04/21/15   Birdie Sons, MD  traMADol Veatrice [place]bon) 50 MG tablet Take 1-2 tablets by mouth every 6 hours as needed for moderate to severe pain 12/06/15   Hinda Kehr, MD  traZODone (DESYREL) 100 MG tablet Take 1 tablet (100 mg total) by mouth at bedtime. 09/29/15   Birdie Sons, MD    Allergies Review of patient's allergies indicates no known allergies.  Family History  Problem Relation Age of Onset  . Lung cancer Brother   . Diabetes Father   . Heart disease Sister     Valvular rheumatic heart disease  . Heart disease Paternal Grandmother   . Breast cancer Neg Hx   . Ovarian cancer Neg Hx   . Colon cancer Neg Hx     Social History Social History  Substance Use Topics  . Smoking status: Never Smoker  . Smokeless tobacco: Never Used  . Alcohol use No    Review of Systems Constitutional: No fever/chills Eyes: No visual changes. ENT: No sore  throat. Cardiovascular: Denies chest pain. Respiratory: Denies shortness of breath. Gastrointestinal: No abdominal pain.  No nausea, no vomiting.  No diarrhea.  No constipation. Genitourinary: Negative for dysuria. Musculoskeletal: Pain in head and right knee Skin: Negative for rash. Neurological: Negative for headaches, focal weakness or numbness.  10-point ROS otherwise negative.  ____________________________________________   PHYSICAL EXAM:  VITAL SIGNS: ED Triage Vitals  Enc Vitals Group     BP 12/06/15 0147 140/70     Pulse Rate 12/06/15 0147 72     Resp 12/06/15 0147 18     Temp 12/06/15 0147 98.7 F (37.1 C)     Temp Source 12/06/15 0147 Oral     SpO2 12/06/15 0147 98 %     Weight 12/06/15 0148 150 lb (68 kg)  Height 12/06/15 0148 _0  (1.651 m)     Head Circumference --      Peak Flow --      Pain Score 12/06/15 0148 5     Pain Loc --      Pain Edu? --      Excl. in [place]? --     Constitutional: Alert and oriented. Well appearing and in no acute distress. Eyes: Conjunctivae are normal. PERRL. EOMI.  No orbital tenderness to palpation. Head: Sizeable hematoma and ecchymosis to right forehead without skin disruption.  Mild tenderness to palpation.   Nose: No congestion/rhinnorhea. Mouth/Throat: Mucous membranes are moist.  Oropharynx non-erythematous. Neck: No stridor.  No meningeal signs.  No cervical spine tenderness to palpation; full non-tender ROM of head/neck. Cardiovascular: Normal rate, regular rhythm. Good peripheral circulation. Grossly normal heart sounds. Respiratory: Normal respiratory effort.  No retractions. Lungs CTAB. Gastrointestinal: Soft and nontender. No distention.  Musculoskeletal: Swelling and tenderness to right patella, worse with any attempt at flexion/extension of the joint.  Difficulty to assess laxity of the joint due to the patellar discomfort, but the joint itself appears to be stable. Neurologic:  Normal speech and language. No  gross focal neurologic deficits are appreciated.  Skin:  Skin is warm, dry and intact. No rash noted. Psychiatric: Mood and affect are normal. Speech and behavior are normal.  ____________________________________________   LABS (all labs ordered are listed, but only abnormal results are displayed)  Labs Reviewed - No data to display ____________________________________________  EKG  EKG not ordered ____________________________________________  RADIOLOGY   Ct Head Wo Contrast  Result Date: 12/06/2015 CLINICAL DATA:  Status post fall, with headache.  Initial encounter. EXAM: CT HEAD WITHOUT CONTRAST TECHNIQUE: Contiguous axial images were obtained from the base of the skull through the vertex without intravenous contrast. COMPARISON:  None. FINDINGS: Brain: No evidence of acute infarction, hemorrhage, hydrocephalus, extra-axial collection or mass lesion/mass effect. Mild periventricular white matter change likely reflects small vessel ischemic microangiopathy. The posterior fossa, including the cerebellum, brainstem and fourth ventricle, is within normal limits. The third and lateral ventricles, and basal ganglia are unremarkable in appearance. The cerebral hemispheres are symmetric in appearance, with normal gray-white differentiation. No mass effect or midline shift is seen. Vascular: No hyperdense vessel or unexpected calcification. Skull: There is no evidence of fracture; visualized osseous structures are unremarkable in appearance. Sinuses/Orbits: The visualized portions of the orbits are within normal limits. The paranasal sinuses and mastoid air cells are well-aerated. Other: Focal soft tissue swelling is noted overlying the right frontal calvarium. IMPRESSION: 1. No evidence of traumatic intracranial injury or fracture. 2. Focal soft swelling overlying the right frontal calvarium. 3. Mild small vessel ischemic microangiopathy. Electronically Signed   By: Garald Balding M.D.   On:  12/06/2015 03:03   Dg Knee Complete 4 Views Right  Result Date: 12/06/2015 CLINICAL DATA:  Trip and fall with right knee pain. EXAM: RIGHT KNEE - COMPLETE 4+ VIEW COMPARISON:  None. FINDINGS: Probable mid upper nondisplaced patellar fracture. No additional acute fracture. Moderate tricompartmental osteoarthritis most prominent in the medial compartment with joint space narrowing and peripheral spurring. Anterior soft tissue edema and small joint effusion. IMPRESSION: Probable nondisplaced mid upper patellar fracture. Moderate tricompartmental osteoarthritis. Small joint effusion. Electronically Signed   By: Jeb Levering M.D.   On: 12/06/2015 02:34    ____________________________________________   PROCEDURES  Procedure(s) performed:   Procedures   Critical Care performed: No ____________________________________________   INITIAL IMPRESSION / ASSESSMENT AND PLAN /  ED COURSE  Pertinent labs & imaging results that were available during my care of the patient were reviewed by me and considered in my medical decision making (see chart for details).  Patient laughing and joking with her about her accident in spite of her injuries, in no acute discomfort when resting.  Discussed images by phone with Dr. Roland Rack who reviewed them personally and agreed with radiology interpretation of non-displaced patellar fracture.  He plans for non-operative treatment, recommended knee immobilizer, pain control, and outpatient follow up next week.  I passed this along to the patient and family and they all agree with the plan.  Gave usual/customary return precautions.   ____________________________________________  FINAL CLINICAL IMPRESSION(S) / ED DIAGNOSES  Final diagnoses:  Closed nondisplaced fracture of right patella, unspecified fracture morphology, initial encounter  Forehead contusion, initial encounter     MEDICATIONS GIVEN DURING THIS VISIT:  Medications  traMADol (ULTRAM) tablet 100  mg (100 mg Oral Given 12/06/15 0502)  ondansetron (ZOFRAN-ODT) disintegrating tablet 4 mg (4 mg Oral Given 12/06/15 0502)     NEW OUTPATIENT MEDICATIONS STARTED DURING THIS VISIT:  Discharge Medication List as of 12/06/2015  5:08 AM    START taking these medications   Details  ondansetron (ZOFRAN ODT) 4 MG disintegrating tablet Allow 1-2 tablets to dissolve in your mouth every 8 hours as needed for nausea/vomiting, Print    traMADol (ULTRAM) 50 MG tablet Take 1-2 tablets by mouth every 6 hours as needed for moderate to severe pain, Print        Discharge Medication List as of 12/06/2015  5:08 AM      Discharge Medication List as of 12/06/2015  5:08 AM       Note:  This document was prepared using Dragon voice recognition software and may include unintentional dictation errors.    Hinda Kehr, MD 12/07/15 [phone]

## 2015-12-06 NOTE — ED Notes (Signed)
Knee immobilizer put on by this RN. Pt given walker and gone over by this RN, return demonstration given by patient. Discharge instructions reviewed with patient. Patient verbalized understanding. Patient taken to lobby via wheelchair by her family.

## 2015-12-06 NOTE — ED Triage Notes (Signed)
Patient was walking on uneven sidewalk and tripped. Patient hit her head. Denies LOC. Patient with hematoma to right forehead. Patient with complaint of right knee pain. Patient states that she takes Plavix and asa.

## 2015-12-06 NOTE — Discharge Instructions (Signed)
Please wear your knee immobilizer at all times until you follow up with Orthopedics.  Use your walker or crutches to help support yourself, but you may bear weight as tolerated.    Return to the emergency department if you develop new or worsening symptoms that concern you.

## 2015-12-12 DIAGNOSIS — M25561 Pain in right knee: Secondary | ICD-10-CM | POA: Diagnosis not present

## 2015-12-12 DIAGNOSIS — S82001A Unspecified fracture of right patella, initial encounter for closed fracture: Secondary | ICD-10-CM | POA: Diagnosis not present

## 2015-12-19 DIAGNOSIS — S82001A Unspecified fracture of right patella, initial encounter for closed fracture: Secondary | ICD-10-CM | POA: Diagnosis not present

## 2015-12-19 DIAGNOSIS — M25561 Pain in right knee: Secondary | ICD-10-CM | POA: Diagnosis not present

## 2015-12-31 ENCOUNTER — Ambulatory Visit (INDEPENDENT_AMBULATORY_CARE_PROVIDER_SITE_OTHER): Payer: PPO | Admitting: Family Medicine

## 2015-12-31 ENCOUNTER — Encounter: Payer: Self-pay | Admitting: Family Medicine

## 2015-12-31 VITALS — BP 110/60 | HR 76 | Temp 98.1°F | Resp 16 | Ht 62.75 in | Wt 152.0 lb

## 2015-12-31 DIAGNOSIS — I10 Essential (primary) hypertension: Secondary | ICD-10-CM | POA: Diagnosis not present

## 2015-12-31 DIAGNOSIS — Z23 Encounter for immunization: Secondary | ICD-10-CM | POA: Diagnosis not present

## 2015-12-31 DIAGNOSIS — E119 Type 2 diabetes mellitus without complications: Secondary | ICD-10-CM | POA: Diagnosis not present

## 2015-12-31 DIAGNOSIS — S82001D Unspecified fracture of right patella, subsequent encounter for closed fracture with routine healing: Secondary | ICD-10-CM

## 2015-12-31 DIAGNOSIS — Z0000 Encounter for general adult medical examination without abnormal findings: Secondary | ICD-10-CM

## 2015-12-31 DIAGNOSIS — R928 Other abnormal and inconclusive findings on diagnostic imaging of breast: Secondary | ICD-10-CM | POA: Diagnosis not present

## 2015-12-31 LAB — POCT GLYCOSYLATED HEMOGLOBIN (HGB A1C)
Est. average glucose Bld gHb Est-mCnc: 183
Hemoglobin A1C: 8

## 2015-12-31 NOTE — Patient Instructions (Addendum)
   Please call the [facility] [phone]) to schedule a diagnostic mammogram.and breast ultrasound

## 2015-12-31 NOTE — Progress Notes (Signed)
Patient: April Pittman, Female    DOB: [birth year], 75 y.o.   MRN: [id number] Visit Date: 12/31/2015  Today's Provider: [name], MD   No chief complaint on file.  Subjective:    Annual wellness visit April Pittman is a 75 y.o. female. She feels fairly well. She reports never exercising. She reports she is sleeping poorly.  -----------------------------------------------------------  Hypertension, follow-up:  BP Readings from Last 3 Encounters:  12/06/15 138/78  10/09/15 136/70  09/29/15 (!) 106/58    She was last seen for hypertension 3 months ago.  BP at that visit was 106/58. Management since that visit includes no changes. She reports good compliance with treatment. She is not having side effects.  She is not exercising. She is not adherent to low salt diet.   Outside blood pressures are not being checked. She is experiencing chest pain.  Patient denies chest pressure/discomfort, claudication, dyspnea, exertional chest pressure/discomfort, fatigue, irregular heart beat, lower extremity edema, near-syncope, orthopnea, palpitations, paroxysmal nocturnal dyspnea, syncope and tachypnea.   Cardiovascular risk factors include advanced age (older than 41 for men, 13 for women), diabetes mellitus, dyslipidemia, hypertension and sedentary lifestyle.  Use of agents associated with hypertension: NSAIDS.     Weight trend: fluctuating a bit Wt Readings from Last 3 Encounters:  12/06/15 150 lb (68 kg)  10/09/15 150 lb 4.8 oz (68.2 kg)  09/29/15 151 lb (68.5 kg)    Current diet: in general, an "unhealthy" diet  ------------------------------------------------------------------------  Follow up Hypothyroidism:  Patient was last seen for this problem 3 months ago and no changes were made.  Patient reports good compliance with treatment and good tolerance.     Diabetes Mellitus Type II, Follow-up:   Lab Results  Component Value Date   HGBA1C 7.9  09/29/2015   HGBA1C 7.9 06/11/2015   HGBA1C 7.5 01/29/2015    Last seen for diabetes 3 months ago.  Management since then includes no changes. She reports good compliance with treatment. She is not having side effects.  Current symptoms include none and have been stable. Home blood sugar records: fasting range: 150-160  Episodes of hypoglycemia? no   Current Insulin Regimen: none Most Recent Eye Exam: <1 year ago Weight trend: fluctuating a bit Prior visit with dietician: no Current diet: in general, an "unhealthy" diet Current exercise: none  Pertinent Labs:    Component Value Date/Time   CHOL 144 09/29/2015 0930   TRIG 191 (H) 09/29/2015 0930   HDL 50 09/29/2015 0930   LDLCALC 56 09/29/2015 0930   CREATININE 0.60 09/29/2015 0930    Wt Readings from Last 3 Encounters:  12/06/15 150 lb (68 kg)  10/09/15 150 lb 4.8 oz (68.2 kg)  09/29/15 151 lb (68.5 kg)    ------------------------------------------------------------------------ Follow up Osteoporosis:  Patient was last seen for this problem 1 year ago. Patient was intolerant to bisphosphonate's and refused other medications at that point.   Follow up Insomnia:  Patient was last seen for this problem 3 months ago and was started on Trazodone. Patient reports good tolerance with treatment, good compliance and poor symptom control.    Lipid/Cholesterol, Follow-up:   Last seen for this 3 months ago.  Management changes since that visit include none. . Last Lipid Panel:    Component Value Date/Time   CHOL 144 09/29/2015 0930   TRIG 191 (H) 09/29/2015 0930   HDL 50 09/29/2015 0930   CHOLHDL 2.9 09/29/2015 0930   LDLCALC 56 09/29/2015 0930  Risk factors for vascular disease include diabetes mellitus, hypercholesterolemia and hypertension  She reports good compliance with treatment. She is not having side effects.  Current symptoms include none and have been stable. Weight trend: fluctuating a bit Prior  visit with dietician: no Current diet: in general, an "unhealthy" diet Current exercise: none  Wt Readings from Last 3 Encounters:  12/06/15 150 lb (68 kg)  10/09/15 150 lb 4.8 oz (68.2 kg)  09/29/15 151 lb (68.5 kg)    ------------------------------------------------------------------- Follow up Vitamin D Deficiency:  Patient was last seen for this problem 3 months ago and no changes were made. Patient states she has not been taking Vitamin D supplements.   Review of Systems  Constitutional: Positive for activity change and appetite change. Negative for chills, fatigue and fever.  HENT: Negative for congestion, ear pain, rhinorrhea, sneezing and sore throat.   Eyes: Positive for photophobia. Negative for pain and redness.  Respiratory: Negative for cough, shortness of breath and wheezing.   Cardiovascular: Positive for chest pain. Negative for leg swelling.  Gastrointestinal: Negative for abdominal pain, blood in stool, constipation, diarrhea and nausea.  Endocrine: Negative for polydipsia and polyphagia.  Genitourinary: Negative.  Negative for dysuria, flank pain, hematuria, pelvic pain, vaginal bleeding and vaginal discharge.  Musculoskeletal: Positive for arthralgias and myalgias (right leg pain). Negative for back pain, gait problem and joint swelling.  Skin: Negative for rash.  Neurological: Positive for headaches. Negative for dizziness, tremors, seizures, weakness, light-headedness and numbness.  Hematological: Negative for adenopathy.  Psychiatric/Behavioral: Positive for sleep disturbance. Negative for behavioral problems, confusion and dysphoric mood. The patient is not nervous/anxious and is not hyperactive.     Social History   Social History  . Marital status: Married    Spouse name: N/A  . Number of children: 3  . Years of education: N/A   Occupational History  . Retired     former Engineer, manufacturing systems   Social History Main Topics  . Smoking status: Never Smoker    . Smokeless tobacco: Never Used  . Alcohol use No  . Drug use: No  . Sexual activity: Not on file   Other Topics Concern  . Not on file   Social History Narrative  . No narrative on file    Past Medical History:  Diagnosis Date  . Alopecia   . Benign neoplasm of large bowel   . CAD (coronary artery disease)   . Carpal tunnel syndrome   . DM (diabetes mellitus) ([place])   . Erosion of cervix   . GERD (gastroesophageal reflux disease)   . H/O adenomatous polyp of colon    with sever dysplasia, removed 2000 by colonoscopy  . History of chicken pox   . Hypercholesteremia   . Hypertension   . Hypothyroid   . Irritable bowel   . Malignant melanoma of foot ([place])   . Osteoporosis   . Post-menopausal bleeding   . Procidentia of uterus    with traction cystocele, managed with gellhorn pessary  . Rectal bleeding   . TIA (transient ischemic attack)      Patient Active Problem List   Diagnosis Date Noted  . Vitamin D deficiency 08/14/2014  . Rectal/anal hemorrhage 06/14/2014  . Postmenopausal vaginal bleeding 06/14/2014  . Hypothyroidism 06/14/2014  . Hypertension 06/14/2014  . Hypercholesteremia 06/14/2014  . TIA (transient ischemic attack) 06/14/2014  . GERD (gastroesophageal reflux disease) 06/14/2014  . Alopecia 06/14/2014  . Malignant melanoma of foot ([place]) 06/14/2014  . Irritable bowel 06/14/2014  .  Coronary artery disease 06/14/2014  . Diabetes mellitus, type 2 ([place]) 06/14/2014  . Carpal tunnel syndrome 06/14/2014  . Procidentia of uterus 06/14/2014  . Osteoporosis 06/14/2014  . History of adenomatous polyp of colon 06/14/2014  . B12 deficiency 08/08/2008  . Colon, diverticulosis 05/14/2008    Past Surgical History:  Procedure Laterality Date  . BREAST EXCISIONAL BIOPSY Right    Benign  . CARDIAC CATHETERIZATION  10/2010   +stenting to LAD and RCA; symptoms: chest pain, elevated Troponin, +AMI. [employer]  . COLONOSCOPY  03/2008   2 polyps, internal and  external hemorrhoids, +anal growth. Dr. Vira Agar  . KNEE SURGERY    . TONSILLECTOMY     as a child    Her family history includes Diabetes in her father; Heart disease in her paternal grandmother and sister; Lung cancer in her brother.     Current Meds  Medication Sig  . amLODipine (NORVASC) 5 MG tablet TAKE 1 TABLET (5 MG TOTAL) BY MOUTH DAILY.  Marland Kitchen aspirin EC 81 MG tablet Take 81 mg by mouth daily.  . Blood Glucose Monitoring Suppl (ONE TOUCH ULTRA MINI) W/DEVICE KIT   . clopidogrel (PLAVIX) 75 MG tablet Take 75 mg by mouth daily.  Marland Kitchen estradiol (ESTRACE) 0.1 MG/GM vaginal cream Place 1 Applicatorful vaginally once a week.  Marland Kitchen glucose blood (ONE TOUCH ULTRA TEST) test strip Use to check blood sugar daily. E 11.9 (Type 2 diabetes mellitis)  . levothyroxine (SYNTHROID, LEVOTHROID) 125 MCG tablet TAKE ONE TABLET BY MOUTH IN THE MORNING ON AN EMPTY STOMACH FOR THYROID  . lisinopril-hydrochlorothiazide (PRINZIDE,ZESTORETIC) 20-25 MG tablet TAKE ONE TABLET BY MOUTH ONCE DAILY FOR BLOOD PRESSURE  . metFORMIN (GLUCOPHAGE) 1000 MG tablet TAKE ONE TABLET BY MOUTH TWICE DAILY  . metoprolol (LOPRESSOR) 50 MG tablet Take 50 mg by mouth daily.  . nitroGLYCERIN (NITROSTAT) 0.4 MG SL tablet Place 1 tablet (0.4 mg total) under the tongue every 5 (five) minutes as needed for chest pain.  [name] DELICA LANCETS FINE MISC   . OXYQUINOLONE SULFATE VAGINAL 0.025 % GEL Place 1 application vaginally once a week.  . simvastatin (ZOCOR) 40 MG tablet TAKE 1 TABLET BY MOUTH EVERY DAY FOR CHOLESTEROL  . traMADol (ULTRAM) 50 MG tablet Take 1-2 tablets by mouth every 6 hours as needed for moderate to severe pain    Patient Care Team: Birdie Sons, MD as PCP - General (Family Medicine) Christene Lye, MD as Consulting Physician (General Surgery) Brayton Mars, MD as Consulting Physician (Obstetrics and Gynecology) Manya Silvas, MD (Gastroenterology) Teodoro Spray, MD as Consulting Physician  (Cardiology)     Objective:   Vitals: BP 110/60 (BP Location: Right Arm, Patient Position: Sitting, Cuff Size: Normal)   Pulse 76   Temp 98.1 F (36.7 C) (Oral)   Resp 16   Ht 5' 2.75" (1.594 m)   Wt 152 lb (68.9 kg)   BMI 27.14 kg/m   Physical Exam   General Appearance:    Alert, cooperative, no distress  Eyes:    PERRL, conjunctiva/corneas clear, EOM's intact       Lungs:     Clear to auscultation bilaterally, respirations unlabored  Heart:    Regular rate and rhythm  Neurologic:   Awake, alert, oriented x 3. No apparent focal neurological           defect.       Results for orders placed or performed in visit on 12/31/15  POCT HgB A1C  Result Value Ref Range   Hemoglobin A1C 8.0    Est. average glucose Bld gHb Est-mCnc 183      Activities of Daily Living In your present state of health, do you have any difficulty performing the following activities: 12/31/2015  Hearing? N  Vision? N  Difficulty concentrating or making decisions? Y  Walking or climbing stairs? Y  Dressing or bathing? N  Doing errands, shopping? N  Some recent data might be hidden    Fall Risk Assessment Fall Risk  12/31/2015 11/11/2014  Falls in the past year? Yes No  Number falls in past yr: 1 -  Follow up Falls prevention discussed -     Depression Screen PHQ 2/9 Scores 12/31/2015 11/11/2014  PHQ - 2 Score 0 0    Cognitive Testing - 6-CIT  Correct? Score   What year is it? yes 0 0 or 4  What month is it? yes 0 0 or 3  Memorize:    Pia Mau,  42,  [place],      What time is it? (within 1 hour) yes 0 0 or 3  Count backwards from 20 yes 0 0, 2, or 4  Name the months of the year yes 0 0, 2, or 4  Repeat name & address above no 6 0, 2, 4, 6, 8, or 10       TOTAL SCORE  6/28   Interpretation:  Normal  Normal (0-7) Abnormal (8-28)    Audit-C Alcohol Use Screening  Question Answer Points  How often do you have alcoholic drink? never 0  On days you do drink alcohol, how  many drinks do you typically consume? n/a 0  How oftey will you drink 6 or more in a total? never 0  Total Score:  0   A score of 3 or more in women, and 4 or more in men indicates increased risk for alcohol abuse, EXCEPT if all of the points are from question 1.   Current Exercise Habits: The patient does not participate in regular exercise at present Exercise limited by: Other - see comments (recent leg injury due to fall)   Assessment & Plan:     Annual Wellness Visit  Reviewed patient's Family Medical History Reviewed and updated list of patient's medical providers Assessment of cognitive impairment was done Assessed patient's functional ability Established a written schedule for health screening [place] Completed and Reviewed  Exercise Activities and Dietary recommendations Goals    None      Immunization History  Administered Date(s) Administered  . Influenza, High Dose Seasonal PF 01/29/2015  . Pneumococcal Conjugate-13 11/07/2013  . Pneumococcal Polysaccharide-23 12/07/2010  . Zoster 09/15/2015    Health Maintenance  Topic Date Due  . FOOT EXAM  12/06/1950  . TETANUS/TDAP  12/06/1959  . INFLUENZA VACCINE  09/23/2015  . HEMOGLOBIN A1C  03/31/2016  . OPHTHALMOLOGY EXAM  11/27/2016  . COLONOSCOPY  09/29/2018  . DEXA SCAN  Completed  . ZOSTAVAX  Completed  . PNA vac Low Risk Adult  Completed     Discussed health benefits of physical activity, and encouraged her to engage in regular exercise appropriate for her age and condition.    ------------------------------------------------------------------------------------------------------------  1. Medicare annual wellness visit, subsequent   2. Abnormal mammogram Advised to schedule follow up diagnostic mammogram and ultrasound ASAP  3. Essential hypertension Well controlled.  Continue current medications.    4. Closed nondisplaced fracture of right patella with routine  healing,  unspecified fracture morphology, subsequent encounter Slowly healing, continue follow up with ortho and increase amubulation as tolerated.   5. Type 2 diabetes mellitus without complication, without long-term current use of insulin (HCC) A1c Is up, she admits to consuming quite a bit of candy for the Halloween season and has not been nearly as active since fracturing patella. Expect improvement with diet changes and increase activity. Follow up in 4 months.  - POCT HgB A1C  6. Need for influenza vaccination  - Flu vaccine HIGH DOSE PF (Fluzone High dose)  The entirety of the information documented in the History of Present Illness, Review of Systems and Physical Exam were personally obtained by me. Portions of this information were initially documented by Meyer Cory, CMA and reviewed by me for thoroughness and accuracy.    [name], MD  [facility]

## 2016-01-01 DIAGNOSIS — M25561 Pain in right knee: Secondary | ICD-10-CM | POA: Diagnosis not present

## 2016-01-01 DIAGNOSIS — S82001D Unspecified fracture of right patella, subsequent encounter for closed fracture with routine healing: Secondary | ICD-10-CM | POA: Diagnosis not present

## 2016-01-13 ENCOUNTER — Telehealth: Payer: Self-pay

## 2016-01-13 NOTE — Telephone Encounter (Signed)
-----   Message from Birdie Sons, MD sent at 01/13/2016  3:12 PM EST ----- Please check with patient to see if diagnostic mammogram was ever done. I see it was ordered but there is no result in EMR.

## 2016-01-13 NOTE — Telephone Encounter (Signed)
Patient states she did not have it done due to recent fall. I gave her number for [facility] so she can contact them to arrange appt.KW

## 2016-01-19 DIAGNOSIS — M1711 Unilateral primary osteoarthritis, right knee: Secondary | ICD-10-CM | POA: Diagnosis not present

## 2016-01-19 DIAGNOSIS — S82001D Unspecified fracture of right patella, subsequent encounter for closed fracture with routine healing: Secondary | ICD-10-CM | POA: Diagnosis not present

## 2016-01-27 ENCOUNTER — Other Ambulatory Visit: Payer: Self-pay | Admitting: Family Medicine

## 2016-01-28 ENCOUNTER — Ambulatory Visit: Payer: PPO | Admitting: Obstetrics and Gynecology

## 2016-02-02 ENCOUNTER — Ambulatory Visit
Admission: RE | Admit: 2016-02-02 | Discharge: 2016-02-02 | Disposition: A | Discharge status: Home / Self Care | Payer: PPO | Source: Ambulatory Visit | Attending: Family Medicine | Admitting: Family Medicine

## 2016-02-02 DIAGNOSIS — N6320 Unspecified lump in the left breast, unspecified quadrant: Secondary | ICD-10-CM | POA: Diagnosis not present

## 2016-02-02 DIAGNOSIS — R928 Other abnormal and inconclusive findings on diagnostic imaging of breast: Secondary | ICD-10-CM | POA: Diagnosis not present

## 2016-03-01 DIAGNOSIS — S82001D Unspecified fracture of right patella, subsequent encounter for closed fracture with routine healing: Secondary | ICD-10-CM | POA: Diagnosis not present

## 2016-03-01 DIAGNOSIS — G8929 Other chronic pain: Secondary | ICD-10-CM | POA: Diagnosis not present

## 2016-03-01 DIAGNOSIS — M25561 Pain in right knee: Secondary | ICD-10-CM | POA: Diagnosis not present

## 2016-03-01 DIAGNOSIS — M6281 Muscle weakness (generalized): Secondary | ICD-10-CM | POA: Diagnosis not present

## 2016-03-01 DIAGNOSIS — M25661 Stiffness of right knee, not elsewhere classified: Secondary | ICD-10-CM | POA: Diagnosis not present

## 2016-03-05 DIAGNOSIS — G8929 Other chronic pain: Secondary | ICD-10-CM | POA: Diagnosis not present

## 2016-03-05 DIAGNOSIS — M25561 Pain in right knee: Secondary | ICD-10-CM | POA: Diagnosis not present

## 2016-03-05 DIAGNOSIS — M6281 Muscle weakness (generalized): Secondary | ICD-10-CM | POA: Diagnosis not present

## 2016-03-05 DIAGNOSIS — M25661 Stiffness of right knee, not elsewhere classified: Secondary | ICD-10-CM | POA: Diagnosis not present

## 2016-03-05 DIAGNOSIS — S82001D Unspecified fracture of right patella, subsequent encounter for closed fracture with routine healing: Secondary | ICD-10-CM | POA: Diagnosis not present

## 2016-03-09 DIAGNOSIS — H353211 Exudative age-related macular degeneration, right eye, with active choroidal neovascularization: Secondary | ICD-10-CM | POA: Diagnosis not present

## 2016-03-12 DIAGNOSIS — H353211 Exudative age-related macular degeneration, right eye, with active choroidal neovascularization: Secondary | ICD-10-CM | POA: Diagnosis not present

## 2016-03-16 ENCOUNTER — Ambulatory Visit: Payer: PPO | Admitting: Obstetrics and Gynecology

## 2016-03-19 ENCOUNTER — Encounter: Payer: Self-pay | Admitting: Family Medicine

## 2016-04-12 DIAGNOSIS — E7800 Pure hypercholesterolemia, unspecified: Secondary | ICD-10-CM | POA: Diagnosis not present

## 2016-04-12 DIAGNOSIS — I2510 Atherosclerotic heart disease of native coronary artery without angina pectoris: Secondary | ICD-10-CM | POA: Diagnosis not present

## 2016-04-12 DIAGNOSIS — E119 Type 2 diabetes mellitus without complications: Secondary | ICD-10-CM | POA: Diagnosis not present

## 2016-04-12 DIAGNOSIS — K219 Gastro-esophageal reflux disease without esophagitis: Secondary | ICD-10-CM | POA: Diagnosis not present

## 2016-04-12 DIAGNOSIS — I10 Essential (primary) hypertension: Secondary | ICD-10-CM | POA: Diagnosis not present

## 2016-04-16 ENCOUNTER — Telehealth: Payer: Self-pay | Admitting: Family Medicine

## 2016-04-16 DIAGNOSIS — H353211 Exudative age-related macular degeneration, right eye, with active choroidal neovascularization: Secondary | ICD-10-CM | POA: Diagnosis not present

## 2016-04-16 NOTE — Telephone Encounter (Signed)
Called Pt to schedule AWV with [facility] - knb °

## 2016-04-27 ENCOUNTER — Encounter: Payer: Self-pay | Admitting: Obstetrics and Gynecology

## 2016-04-27 ENCOUNTER — Ambulatory Visit (INDEPENDENT_AMBULATORY_CARE_PROVIDER_SITE_OTHER): Payer: PPO | Admitting: Obstetrics and Gynecology

## 2016-04-27 VITALS — BP 142/68 | HR 81 | Ht 62.75 in | Wt 145.0 lb

## 2016-04-27 DIAGNOSIS — N813 Complete uterovaginal prolapse: Secondary | ICD-10-CM | POA: Diagnosis not present

## 2016-04-27 DIAGNOSIS — Z4689 Encounter for fitting and adjustment of other specified devices: Secondary | ICD-10-CM

## 2016-04-27 NOTE — Patient Instructions (Signed)
1. Return in 4 months for pessary maintenance 2. Continue using TRIMOSAN gel intravaginal weekly

## 2016-04-27 NOTE — Progress Notes (Signed)
Chief complaint: 1. Pessary maintenance 2. Procidentia 3. Cystocele  Patient presents for  pessary check. (Last visit 10/09/2015) She is not experiencing any difficulties. She is capable of removing and reinserting the pessary (gel horn). No vaginal discharge, vaginal bleeding, or pelvic pain. Bowel and bladder function are normal. She is using Estrace cream once a week. She is using Trimosan gel weekly  Past medical history, past surgical history, problem list, medications, and allergies are reviewed Dementia changes are an ongoing issue.  OBJECTIVE: BP (!) 142/68   Pulse 81   Ht 5' 2.75" (1.594 m)   Wt 145 lb (65.8 kg)   BMI 25.89 kg/m  Pleasant female in no acute distress. Gel horn pessary is out at the present time PELVIC: External Genitalia: Normal BUS: Normal Vagina: Mildly atrophic; cystocele present Cervix: Parous; No cervical motion tenderness; 1 cm cervical polyp at 5:00 on ectocervix Uterus: Normal size, shape,consistency, mobile RV: Normal External exam Bladder: Nontender  PROCEDURE: Gellhorn pessary is inserted  ASSESSMENT: 1. Procidentia. 2. Traction cystocele. 3. Normal pessary maintenance evaluation  PLAN: 1. Return in 4 months for pessary maintenance. 2. Continue with Estrace cream once or twice a week. Intravaginal 3. Continue with Trimo San gel weekly  A total of 15 minutes were spent face-to-face with the patient during this encounter and over half of that time dealt with counseling and coordination of care.  Brayton Mars, MD  Note: This dictation was prepared with Dragon dictation along with smaller phrase technology. Any transcriptional errors that result from this process are unintentional.

## 2016-04-28 ENCOUNTER — Encounter: Payer: Self-pay | Admitting: Family Medicine

## 2016-04-28 ENCOUNTER — Ambulatory Visit (INDEPENDENT_AMBULATORY_CARE_PROVIDER_SITE_OTHER): Payer: PPO | Admitting: Family Medicine

## 2016-04-28 VITALS — BP 122/70 | HR 74 | Temp 97.7°F | Resp 16 | Wt 146.0 lb

## 2016-04-28 DIAGNOSIS — E7800 Pure hypercholesterolemia, unspecified: Secondary | ICD-10-CM

## 2016-04-28 DIAGNOSIS — I2510 Atherosclerotic heart disease of native coronary artery without angina pectoris: Secondary | ICD-10-CM

## 2016-04-28 DIAGNOSIS — I10 Essential (primary) hypertension: Secondary | ICD-10-CM | POA: Diagnosis not present

## 2016-04-28 DIAGNOSIS — E039 Hypothyroidism, unspecified: Secondary | ICD-10-CM | POA: Diagnosis not present

## 2016-04-28 DIAGNOSIS — E119 Type 2 diabetes mellitus without complications: Secondary | ICD-10-CM | POA: Diagnosis not present

## 2016-04-28 LAB — POCT GLYCOSYLATED HEMOGLOBIN (HGB A1C)
Est. average glucose Bld gHb Est-mCnc: 183
Hemoglobin A1C: 8

## 2016-04-28 MED ORDER — DAPAGLIFLOZIN PROPANEDIOL 5 MG PO TABS: 5.0000 mg | ORAL_TABLET | ORAL | 0 refills | Status: DC

## 2016-04-28 NOTE — Progress Notes (Signed)
Patient: April Pittman Female    DOB: 27-Nov-1940   76 y.o.   MRN: [id number] Visit Date: 04/28/2016  Today's Provider: [name], MD   Chief Complaint  Patient presents with  . Hypertension    follow up  . Diabetes    follow up   Subjective:    HPI  Hypertension, follow-up:  BP Readings from Last 3 Encounters:  04/27/16 (!) 142/68  12/31/15 110/60  12/06/15 138/78    She was last seen for hypertension 4 months ago.  BP at that visit was 110/60. Management since that visit includes no changes. She reports good compliance with treatment. She is not having side effects.  She is not exercising. She is not adherent to low salt diet.   Outside blood pressures are not checked. She is experiencing chest pain.  Patient denies chest pressure/discomfort, claudication, dyspnea, exertional chest pressure/discomfort, fatigue, irregular heart beat, lower extremity edema, near-syncope, orthopnea, palpitations, paroxysmal nocturnal dyspnea, syncope and tachypnea.   Cardiovascular risk factors include advanced age (older than 2 for men, 89 for women) and diabetes mellitus.  Use of agents associated with hypertension: NSAIDS.     Weight trend: decreasing steadily Wt Readings from Last 3 Encounters:  04/27/16 145 lb (65.8 kg)  12/31/15 152 lb (68.9 kg)  12/06/15 150 lb (68 kg)    Current diet: well balanced  ------------------------------------------------------------------------  Diabetes Mellitus Type II, Follow-up:   Lab Results  Component Value Date   HGBA1C 8.0 12/31/2015   HGBA1C 7.9 09/29/2015   HGBA1C 7.9 06/11/2015    Last seen for diabetes 4 months ago.  Management since then includes counseling patient to improve diet and increase exercise. She reports good compliance with treatment.  States she has cut back on candies and cut back on ginger-ale She is not having side effects.  Current symptoms include none and have been stable. Home blood sugar  records: per patient her blood sugars vary usually in the low to mid 100s.   Episodes of hypoglycemia? no   Current Insulin Regimen: none Most Recent Eye Exam: < 1 year ago Weight trend: decreasing steadily Prior visit with dietician: no Current diet: well balanced Current exercise: none  Pertinent Labs:    Component Value Date/Time   CHOL 144 09/29/2015 0930   TRIG 191 (H) 09/29/2015 0930   HDL 50 09/29/2015 0930   LDLCALC 56 09/29/2015 0930   CREATININE 0.60 09/29/2015 0930    Wt Readings from Last 3 Encounters:  04/27/16 145 lb (65.8 kg)  12/31/15 152 lb (68.9 kg)  12/06/15 150 lb (68 kg)    ------------------------------------------------------------------------ Follow up hypothyroid Lab Results  Component Value Date   TSH 2.370 09/29/2015   Taking medications consistently and feels well.   Continue regular follow up with cardiology for CAD.   Patient Care Team    Relationship Specialty Notifications Start End  Birdie Sons, MD PCP - General Family Medicine  08/14/14   Christene Lye, MD Consulting Physician General Surgery  08/14/14   Brayton Mars, MD Consulting Physician Obstetrics and Gynecology  08/14/14   Manya Silvas, MD  Gastroenterology  08/14/14   Teodoro Spray, MD Consulting Physician Cardiology  08/14/14        No Known Allergies   Current Outpatient Prescriptions:  .  amLODipine (NORVASC) 5 MG tablet, TAKE 1 TABLET (5 MG TOTAL) BY MOUTH DAILY., Disp: 30 tablet, Rfl: 12 .  aspirin EC 81 MG tablet, Take  81 mg by mouth daily., Disp: , Rfl:  .  Blood Glucose Monitoring Suppl (ONE TOUCH ULTRA MINI) W/DEVICE KIT, , Disp: , Rfl:  .  cholecalciferol (VITAMIN D) 400 UNITS TABS tablet, Take 1,000 Units by mouth daily., Disp: , Rfl:  .  clopidogrel (PLAVIX) 75 MG tablet, Take 75 mg by mouth daily., Disp: , Rfl:  .  estradiol (ESTRACE) 0.1 MG/GM vaginal cream, Place 1 Applicatorful vaginally once a week., Disp: , Rfl:  .  glucose blood  (ONE TOUCH ULTRA TEST) test strip, Use to check blood sugar daily. E 11.9 (Type 2 diabetes mellitis), Disp: 100 each, Rfl: 3 .  levothyroxine (SYNTHROID, LEVOTHROID) 125 MCG tablet, TAKE ONE TABLET BY MOUTH IN THE MORNING ON AN EMPTY STOMACH FOR THYROID, Disp: 90 tablet, Rfl: 2 .  lisinopril-hydrochlorothiazide (PRINZIDE,ZESTORETIC) 20-25 MG tablet, TAKE ONE TABLET BY MOUTH ONCE DAILY, Disp: 90 tablet, Rfl: 2 .  metFORMIN (GLUCOPHAGE) 1000 MG tablet, TAKE ONE TABLET BY MOUTH TWICE DAILY, Disp: 180 tablet, Rfl: 2 .  metoprolol (LOPRESSOR) 50 MG tablet, Take 50 mg by mouth daily., Disp: , Rfl:  .  Multiple Vitamins-Minerals (PRESERVISION AREDS 2 PO), Take 2 capsules by mouth daily., Disp: , Rfl:  .  nitroGLYCERIN (NITROSTAT) 0.4 MG SL tablet, Place 1 tablet (0.4 mg total) under the tongue every 5 (five) minutes as needed for chest pain., Disp: 30 tablet, Rfl: 0 .  ONETOUCH DELICA LANCETS FINE MISC, , Disp: , Rfl:  .  OXYQUINOLONE SULFATE VAGINAL 0.025 % GEL, Place 1 application vaginally once a week., Disp: , Rfl:  .  simvastatin (ZOCOR) 40 MG tablet, TAKE 1 TABLET BY MOUTH EVERY DAY FOR CHOLESTEROL, Disp: 30 tablet, Rfl: 12 .  traMADol (ULTRAM) 50 MG tablet, Take 1-2 tablets by mouth every 6 hours as needed for moderate to severe pain, Disp: 20 tablet, Rfl: 0 .  traZODone (DESYREL) 100 MG tablet, Take 1 tablet (100 mg total) by mouth at bedtime., Disp: 30 tablet, Rfl: 2 .  vitamin B-12 (CYANOCOBALAMIN) 1000 MCG tablet, Take by mouth., Disp: , Rfl:   Review of Systems  Constitutional: Negative for appetite change, chills, fatigue and fever.  Respiratory: Negative for chest tightness and shortness of breath.   Cardiovascular: Positive for chest pain. Negative for palpitations.  Gastrointestinal: Negative for abdominal pain, nausea and vomiting.  Musculoskeletal: Positive for myalgias (right arm pain).  Neurological: Negative for dizziness and weakness.    Social History  Substance Use Topics  .  Smoking status: Never Smoker  . Smokeless tobacco: Never Used  . Alcohol use No   Objective:   BP 122/70 (BP Location: Left Arm, Patient Position: Sitting, Cuff Size: Normal)   Pulse 74   Temp 97.7 F (36.5 C) (Oral)   Resp 16   Wt 146 lb (66.2 kg)   SpO2 96% Comment: room air  BMI 26.07 kg/m     Physical Exam   General Appearance:    Alert, cooperative, no distress  Eyes:    PERRL, conjunctiva/corneas clear, EOM's intact       Lungs:     Clear to auscultation bilaterally, respirations unlabored  Heart:    Regular rate and rhythm  Neurologic:   Awake, alert, oriented x 3. No apparent focal neurological           defect.       Results for orders placed or performed in visit on 04/28/16  POCT HgB A1C  Result Value Ref Range   Hemoglobin A1C 8.0  Est. average glucose Bld gHb Est-mCnc 183         Assessment & Plan:     1. Type 2 diabetes mellitus without complication, without long-term current use of insulin ([place]) Long discussion about reducing cardiac risk in diabetics. She is very concerned about cost of additional medications, but is willing to  - POCT HgB A1C - dapagliflozin propanediol (FARXIGA) 5 MG TABS tablet; Take 5 mg by mouth every morning.  Dispense: 28 tablet; Refill: 0  2. Essential hypertension Stable Continue current medications.    3. Coronary artery disease involving native coronary artery of native heart without angina pectoris Asymptomatic. Compliant with medication.  Continue aggressive risk factor modification.  Add Farxiga for cardiac risk reduction.   4. Hypothyroidism, unspecified type  - T4 AND TSH  5. Hypercholesteremia  - Comprehensive metabolic panel - Lipid panel       [name], MD  [facility]

## 2016-04-29 ENCOUNTER — Telehealth: Payer: Self-pay

## 2016-04-29 LAB — COMPREHENSIVE METABOLIC PANEL
ALK PHOS: 89 IU/L (ref 39–117)
ALT: 13 IU/L (ref 0–32)
AST: 16 IU/L (ref 0–40)
Albumin/Globulin Ratio: 1.5 (ref 1.2–2.2)
Albumin: 4.3 g/dL (ref 3.5–4.8)
BUN / CREAT RATIO: 15 (ref 12–28)
BUN: 8 mg/dL (ref 8–27)
Bilirubin Total: 0.3 mg/dL (ref 0.0–1.2)
CO2: 27 mmol/L (ref 18–29)
CREATININE: 0.53 mg/dL — AB (ref 0.57–1.00)
Calcium: 9.5 mg/dL (ref 8.7–10.3)
Chloride: 96 mmol/L (ref 96–106)
GFR calc Af Amer: 107 mL/min/{1.73_m2} (ref >59)
GFR, EST NON AFRICAN AMERICAN: 93 mL/min/{1.73_m2} (ref >59)
GLOBULIN, TOTAL: 2.9 g/dL (ref 1.5–4.5)
GLUCOSE: 131 mg/dL — AB (ref 65–99)
Potassium: 4.7 mmol/L (ref 3.5–5.2)
SODIUM: 140 mmol/L (ref 134–144)
TOTAL PROTEIN: 7.2 g/dL (ref 6.0–8.5)

## 2016-04-29 LAB — T4 AND TSH
T4 TOTAL: 9.8 ug/dL (ref 4.5–12.0)
TSH: 0.585 u[IU]/mL (ref 0.450–4.500)

## 2016-04-29 LAB — LIPID PANEL
CHOL/HDL RATIO: 3.1 ratio (ref 0.0–4.4)
CHOLESTEROL TOTAL: 147 mg/dL (ref 100–199)
HDL: 47 mg/dL (ref >39)
LDL CALC: 61 mg/dL (ref 0–99)
Triglycerides: 196 mg/dL — ABNORMAL HIGH (ref 0–149)
VLDL CHOLESTEROL CAL: 39 mg/dL (ref 5–40)

## 2016-04-29 NOTE — Telephone Encounter (Signed)
Pt advised.   Thanks,   -Preet Perrier  

## 2016-04-29 NOTE — Telephone Encounter (Signed)
-----   Message from Birdie Sons, MD sent at 04/29/2016  8:07 AM EST ----- Kidney functions, electrolytes, thyroid function and cholesterol are all normal. Go ahead and start samples of farxiga she was given at office visit. Call if any problems with samples.

## 2016-05-09 ENCOUNTER — Other Ambulatory Visit: Payer: Self-pay | Admitting: Family Medicine

## 2016-06-01 ENCOUNTER — Telehealth: Payer: Self-pay | Admitting: Family Medicine

## 2016-06-01 NOTE — Telephone Encounter (Signed)
Advised  ED 

## 2016-06-01 NOTE — Telephone Encounter (Signed)
Please check with patient to see if she is doing ok with [place] 5mg  samples. If so can have two more boxes to get by until her follow up appointment.

## 2016-06-09 ENCOUNTER — Ambulatory Visit (INDEPENDENT_AMBULATORY_CARE_PROVIDER_SITE_OTHER): Payer: PPO | Admitting: Family Medicine

## 2016-06-09 ENCOUNTER — Encounter: Payer: Self-pay | Admitting: Family Medicine

## 2016-06-09 VITALS — BP 112/62 | HR 72 | Temp 97.9°F | Resp 16 | Wt 146.0 lb

## 2016-06-09 DIAGNOSIS — E119 Type 2 diabetes mellitus without complications: Secondary | ICD-10-CM

## 2016-06-09 DIAGNOSIS — I2510 Atherosclerotic heart disease of native coronary artery without angina pectoris: Secondary | ICD-10-CM

## 2016-06-09 NOTE — Progress Notes (Signed)
Patient: April Pittman Female    DOB: 19-Jul-1940   76 y.o.   MRN: [id number] Visit Date: 06/09/2016  Today's Provider: [name], MD   Chief Complaint  Patient presents with  . Diabetes    recheck   Subjective:    HPI  Diabetes Mellitus Type II, Follow-up:   Lab Results  Component Value Date   HGBA1C 8.0 04/28/2016   HGBA1C 8.0 12/31/2015   HGBA1C 7.9 09/29/2015    Last seen for diabetes 6 weeks ago.  Management since then includes adding samples of Farxiga. She reports good compliance with treatment. She is not having side effects.  Current symptoms include visual disturbances and have been stable. Home blood sugar records: fasting range: 150's  Episodes of hypoglycemia? no   Current Insulin Regimen: none Most Recent Eye Exam: < 1 year ago Weight trend: stable Prior visit with dietician: no Current diet: in general, an "unhealthy" diet Current exercise: none  Pertinent Labs:    Component Value Date/Time   CHOL 147 04/28/2016 1022   TRIG 196 (H) 04/28/2016 1022   HDL 47 04/28/2016 1022   LDLCALC 61 04/28/2016 1022   CREATININE 0.53 (L) 04/28/2016 1022    Wt Readings from Last 3 Encounters:  06/09/16 146 lb (66.2 kg)  04/28/16 146 lb (66.2 kg)  04/27/16 145 lb (65.8 kg)    ------------------------------------------------------------------------     No Known Allergies   Current Outpatient Prescriptions:  .  amLODipine (NORVASC) 5 MG tablet, TAKE 1 TABLET (5 MG TOTAL) BY MOUTH DAILY., Disp: 30 tablet, Rfl: 12 .  aspirin EC 81 MG tablet, Take 81 mg by mouth daily., Disp: , Rfl:  .  Blood Glucose Monitoring Suppl (ONE TOUCH ULTRA MINI) W/DEVICE KIT, , Disp: , Rfl:  .  cholecalciferol (VITAMIN D) 400 UNITS TABS tablet, Take 1,000 Units by mouth daily., Disp: , Rfl:  .  clopidogrel (PLAVIX) 75 MG tablet, Take 75 mg by mouth daily., Disp: , Rfl:  .  dapagliflozin propanediol (FARXIGA) 5 MG TABS tablet, Take 5 mg by mouth every morning.,  Disp: 28 tablet, Rfl: 0 .  estradiol (ESTRACE) 0.1 MG/GM vaginal cream, Place 1 Applicatorful vaginally once a week., Disp: , Rfl:  .  glucose blood (ONE TOUCH ULTRA TEST) test strip, Use to check blood sugar daily. E 11.9 (Type 2 diabetes mellitis), Disp: 100 each, Rfl: 3 .  levothyroxine (SYNTHROID, LEVOTHROID) 125 MCG tablet, TAKE ONE TABLET BY MOUTH IN THE MORNING ON AN EMPTY STOMACH FOR THYROID, Disp: 90 tablet, Rfl: 2 .  lisinopril-hydrochlorothiazide (PRINZIDE,ZESTORETIC) 20-25 MG tablet, TAKE ONE TABLET BY MOUTH ONCE DAILY, Disp: 90 tablet, Rfl: 2 .  metFORMIN (GLUCOPHAGE) 1000 MG tablet, TAKE ONE TABLET BY MOUTH TWICE DAILY, Disp: 180 tablet, Rfl: 2 .  metoprolol (LOPRESSOR) 50 MG tablet, Take 50 mg by mouth daily., Disp: , Rfl:  .  Multiple Vitamins-Minerals (PRESERVISION AREDS 2 PO), Take 2 capsules by mouth daily., Disp: , Rfl:  .  nitroGLYCERIN (NITROSTAT) 0.4 MG SL tablet, Place 1 tablet (0.4 mg total) under the tongue every 5 (five) minutes as needed for chest pain., Disp: 30 tablet, Rfl: 0 .  ONETOUCH DELICA LANCETS FINE MISC, , Disp: , Rfl:  .  OXYQUINOLONE SULFATE VAGINAL 0.025 % GEL, Place 1 application vaginally once a week., Disp: , Rfl:  .  simvastatin (ZOCOR) 40 MG tablet, TAKE 1 TABLET BY MOUTH EVERY DAY FOR CHOLESTEROL, Disp: 30 tablet, Rfl: 12 .  traMADol (ULTRAM) 50  MG tablet, Take 1-2 tablets by mouth every 6 hours as needed for moderate to severe pain, Disp: 20 tablet, Rfl: 0 .  traZODone (DESYREL) 100 MG tablet, Take 1 tablet (100 mg total) by mouth at bedtime., Disp: 30 tablet, Rfl: 2 .  vitamin B-12 (CYANOCOBALAMIN) 1000 MCG tablet, Take by mouth., Disp: , Rfl:   Review of Systems  Constitutional: Negative for appetite change, chills, fatigue and fever.  Eyes: Positive for visual disturbance.  Respiratory: Negative for chest tightness and shortness of breath.   Cardiovascular: Negative for chest pain and palpitations.  Gastrointestinal: Negative for abdominal  pain, nausea and vomiting.  Genitourinary:       Vaginal itching  Neurological: Negative for dizziness and weakness.    Social History  Substance Use Topics  . Smoking status: Never Smoker  . Smokeless tobacco: Never Used  . Alcohol use No   Objective:   BP 112/62 (BP Location: Left Arm, Patient Position: Sitting, Cuff Size: Normal)   Pulse 72   Temp 97.9 F (36.6 C) (Oral)   Resp 16   Wt 146 lb (66.2 kg)   SpO2 96% Comment: room air  BMI 26.07 kg/m     Physical Exam  General appearance: alert, well developed, well nourished, cooperative and in no distress Head: Normocephalic, without obvious abnormality, atraumatic Respiratory: Respirations even and unlabored, normal respiratory rate Extremities: No gross deformities Skin: Skin color, texture, turgor normal. No rashes seen  Psych: Appropriate mood and affect. Neurologic: Mental status: Alert, oriented to person, place, and time, thought content appropriate.     Assessment & Plan:     1. Type 2 diabetes mellitus without complication, without long-term current use of insulin ([place]) Doing well with addition of samples of Farxiga. If electrolytes are stable will send in prescription.  - Renal function panel  2. Coronary artery disease involving native coronary artery of native heart without angina pectoris Asymptomatic. Compliant with medication.  Continue aggressive risk factor modification.         [name], MD  [facility]

## 2016-06-10 LAB — RENAL FUNCTION PANEL
Albumin: 4.3 g/dL (ref 3.5–4.8)
BUN / CREAT RATIO: 15 (ref 12–28)
BUN: 8 mg/dL (ref 8–27)
CALCIUM: 9.4 mg/dL (ref 8.7–10.3)
CHLORIDE: 95 mmol/L — AB (ref 96–106)
CO2: 29 mmol/L (ref 18–29)
Creatinine, Ser: 0.55 mg/dL — ABNORMAL LOW (ref 0.57–1.00)
GFR calc non Af Amer: 92 mL/min/{1.73_m2} (ref >59)
GFR, EST AFRICAN AMERICAN: 106 mL/min/{1.73_m2} (ref >59)
Glucose: 117 mg/dL — ABNORMAL HIGH (ref 65–99)
PHOSPHORUS: 3 mg/dL (ref 2.5–4.5)
POTASSIUM: 3.7 mmol/L (ref 3.5–5.2)
Sodium: 138 mmol/L (ref 134–144)

## 2016-06-11 ENCOUNTER — Other Ambulatory Visit: Payer: Self-pay | Admitting: Family Medicine

## 2016-06-11 DIAGNOSIS — E119 Type 2 diabetes mellitus without complications: Secondary | ICD-10-CM

## 2016-06-11 DIAGNOSIS — H353211 Exudative age-related macular degeneration, right eye, with active choroidal neovascularization: Secondary | ICD-10-CM | POA: Diagnosis not present

## 2016-06-12 NOTE — Progress Notes (Unsigned)
Notes recorded by Birdie Sons, MD on 06/11/2016 at 8:05 AM EDT Normal kidney functions and electrolytes. Need to send prescription for Farxiga 5mg  daily , #30, rf x 3 to pharmacy of choice.. Schedule follow up in 2 months.

## 2016-06-15 ENCOUNTER — Telehealth: Payer: Self-pay | Admitting: *Deleted

## 2016-06-15 ENCOUNTER — Other Ambulatory Visit: Payer: Self-pay | Admitting: *Deleted

## 2016-06-15 DIAGNOSIS — E119 Type 2 diabetes mellitus without complications: Secondary | ICD-10-CM

## 2016-06-15 MED ORDER — CANAGLIFLOZIN 100 MG PO TABS: 100.0000 mg | ORAL_TABLET | Freq: Every day | ORAL | 3 refills | Status: DC

## 2016-06-15 MED ORDER — DAPAGLIFLOZIN PROPANEDIOL 5 MG PO TABS: 5.0000 mg | ORAL_TABLET | ORAL | 3 refills | Status: DC

## 2016-06-15 NOTE — Telephone Encounter (Signed)
Patient was notified of results. Expressed understanding. Rx sent to pharmacy. 

## 2016-06-15 NOTE — Telephone Encounter (Signed)
-----   Message from Birdie Sons, MD sent at 06/11/2016  8:05 AM EDT ----- Normal kidney functions and electrolytes. Need to send prescription for Farxiga 5mg  daily , #30, rf x 3 to pharmacy of choice.. Schedule follow up in 2 months.

## 2016-06-29 DIAGNOSIS — H2513 Age-related nuclear cataract, bilateral: Secondary | ICD-10-CM | POA: Diagnosis not present

## 2016-07-07 ENCOUNTER — Telehealth: Payer: Self-pay | Admitting: Family Medicine

## 2016-07-07 NOTE — Telephone Encounter (Signed)
Labs printed and pt was notified.

## 2016-07-07 NOTE — Telephone Encounter (Signed)
Pt would like a copy of her last labs in April.  She would like to pick up a copy tomorrow  Thank sTeri

## 2016-07-13 DIAGNOSIS — H2513 Age-related nuclear cataract, bilateral: Secondary | ICD-10-CM | POA: Diagnosis not present

## 2016-07-22 NOTE — Discharge Instructions (Signed)

## 2016-07-23 DIAGNOSIS — H353211 Exudative age-related macular degeneration, right eye, with active choroidal neovascularization: Secondary | ICD-10-CM | POA: Diagnosis not present

## 2016-07-28 ENCOUNTER — Ambulatory Visit: Payer: PPO | Admitting: Anesthesiology

## 2016-07-28 ENCOUNTER — Ambulatory Visit
Admission: RE | Admit: 2016-07-28 | Discharge: 2016-07-28 | Disposition: A | Discharge status: Home / Self Care | Payer: PPO | Source: Ambulatory Visit | Attending: Ophthalmology | Admitting: Ophthalmology

## 2016-07-28 ENCOUNTER — Encounter
Admission: RE | Disposition: A | Discharge status: Home / Self Care | Payer: Self-pay | Source: Ambulatory Visit | Attending: Ophthalmology

## 2016-07-28 DIAGNOSIS — I252 Old myocardial infarction: Secondary | ICD-10-CM | POA: Diagnosis not present

## 2016-07-28 DIAGNOSIS — M810 Age-related osteoporosis without current pathological fracture: Secondary | ICD-10-CM | POA: Insufficient documentation

## 2016-07-28 DIAGNOSIS — Z955 Presence of coronary angioplasty implant and graft: Secondary | ICD-10-CM | POA: Diagnosis not present

## 2016-07-28 DIAGNOSIS — H2511 Age-related nuclear cataract, right eye: Secondary | ICD-10-CM | POA: Insufficient documentation

## 2016-07-28 DIAGNOSIS — Z85828 Personal history of other malignant neoplasm of skin: Secondary | ICD-10-CM | POA: Diagnosis not present

## 2016-07-28 DIAGNOSIS — K219 Gastro-esophageal reflux disease without esophagitis: Secondary | ICD-10-CM | POA: Diagnosis not present

## 2016-07-28 DIAGNOSIS — E039 Hypothyroidism, unspecified: Secondary | ICD-10-CM | POA: Insufficient documentation

## 2016-07-28 DIAGNOSIS — I10 Essential (primary) hypertension: Secondary | ICD-10-CM | POA: Diagnosis not present

## 2016-07-28 DIAGNOSIS — K589 Irritable bowel syndrome without diarrhea: Secondary | ICD-10-CM | POA: Insufficient documentation

## 2016-07-28 DIAGNOSIS — Z86010 Personal history of colonic polyps: Secondary | ICD-10-CM | POA: Insufficient documentation

## 2016-07-28 DIAGNOSIS — I2510 Atherosclerotic heart disease of native coronary artery without angina pectoris: Secondary | ICD-10-CM | POA: Insufficient documentation

## 2016-07-28 DIAGNOSIS — E7800 Pure hypercholesterolemia, unspecified: Secondary | ICD-10-CM | POA: Insufficient documentation

## 2016-07-28 DIAGNOSIS — E119 Type 2 diabetes mellitus without complications: Secondary | ICD-10-CM | POA: Diagnosis not present

## 2016-07-28 DIAGNOSIS — H2513 Age-related nuclear cataract, bilateral: Secondary | ICD-10-CM | POA: Diagnosis not present

## 2016-07-28 HISTORY — DX: Angina pectoris, unspecified: I20.9

## 2016-07-28 HISTORY — DX: Other complications of anesthesia, initial encounter: T88.59XA

## 2016-07-28 HISTORY — DX: Other specified postprocedural states: Z98.890

## 2016-07-28 HISTORY — DX: Adverse effect of unspecified anesthetic, initial encounter: T41.45XA

## 2016-07-28 HISTORY — PX: CATARACT EXTRACTION W/PHACO: SHX586

## 2016-07-28 HISTORY — DX: Nausea with vomiting, unspecified: R11.2

## 2016-07-28 LAB — GLUCOSE, CAPILLARY
GLUCOSE-CAPILLARY: 123 mg/dL — AB (ref 65–99)
Glucose-Capillary: 131 mg/dL — ABNORMAL HIGH (ref 65–99)

## 2016-07-28 SURGERY — PHACOEMULSIFICATION, CATARACT, WITH IOL INSERTION
Anesthesia: Monitor Anesthesia Care | Site: Eye | Laterality: Right | Wound class: Clean

## 2016-07-28 MED ORDER — FENTANYL CITRATE (PF) 100 MCG/2ML IJ SOLN
INTRAMUSCULAR | Status: DC | PRN
  Administered 2016-07-28: 50 ug via INTRAVENOUS

## 2016-07-28 MED ORDER — FENTANYL CITRATE (PF) 100 MCG/2ML IJ SOLN: 25.0000 ug | INTRAMUSCULAR | Status: DC | PRN

## 2016-07-28 MED ORDER — NA HYALUR & NA CHOND-NA HYALUR 0.4-0.35 ML IO KIT
PACK | INTRAOCULAR | Status: DC | PRN
  Administered 2016-07-28: 1 mL via INTRAOCULAR

## 2016-07-28 MED ORDER — ACETAMINOPHEN 160 MG/5ML PO SOLN: 325.0000 mg | ORAL | Status: DC | PRN

## 2016-07-28 MED ORDER — ARMC OPHTHALMIC DILATING DROPS
1.0000 "application " | OPHTHALMIC | Status: DC | PRN
  Administered 2016-07-28 (×2): 1 via OPHTHALMIC

## 2016-07-28 MED ORDER — MOXIFLOXACIN HCL 0.5 % OP SOLN
1.0000 [drp] | OPHTHALMIC | Status: DC | PRN
  Administered 2016-07-28 (×2): 1 [drp] via OPHTHALMIC

## 2016-07-28 MED ORDER — LIDOCAINE HCL (PF) 2 % IJ SOLN
INTRAOCULAR | Status: DC | PRN
  Administered 2016-07-28: 1 mL via INTRAOCULAR

## 2016-07-28 MED ORDER — OXYCODONE HCL 5 MG/5ML PO SOLN: 5.0000 mg | Freq: Once | ORAL | Status: DC | PRN

## 2016-07-28 MED ORDER — ACETAMINOPHEN 325 MG PO TABS: 325.0000 mg | ORAL_TABLET | ORAL | Status: DC | PRN

## 2016-07-28 MED ORDER — CEFUROXIME OPHTHALMIC INJECTION 1 MG/0.1 ML
INJECTION | OPHTHALMIC | Status: DC | PRN
  Administered 2016-07-28: .3 mL via OPHTHALMIC

## 2016-07-28 MED ORDER — BRIMONIDINE TARTRATE-TIMOLOL 0.2-0.5 % OP SOLN
OPHTHALMIC | Status: DC | PRN
  Administered 2016-07-28: 1 [drp] via OPHTHALMIC

## 2016-07-28 MED ORDER — PROMETHAZINE HCL 25 MG/ML IJ SOLN: 6.2500 mg | INTRAMUSCULAR | Status: DC | PRN

## 2016-07-28 MED ORDER — MIDAZOLAM HCL 2 MG/2ML IJ SOLN
INTRAMUSCULAR | Status: DC | PRN
  Administered 2016-07-28: 1.5 mg via INTRAVENOUS

## 2016-07-28 MED ORDER — OXYCODONE HCL 5 MG PO TABS: 5.0000 mg | ORAL_TABLET | Freq: Once | ORAL | Status: DC | PRN

## 2016-07-28 MED ORDER — EPINEPHRINE PF 1 MG/ML IJ SOLN
INTRAOCULAR | Status: DC | PRN
  Administered 2016-07-28: 53 mL via OPHTHALMIC

## 2016-07-28 SURGICAL SUPPLY — 25 items
CANNULA ANT/CHMB 27GA (MISCELLANEOUS) ×3 IMPLANT
CARTRIDGE ABBOTT (MISCELLANEOUS) IMPLANT
GLOVE SURG LX 7.5 STRW (GLOVE) ×2
GLOVE SURG LX STRL 7.5 STRW (GLOVE) ×1 IMPLANT
GLOVE SURG TRIUMPH 8.0 PF LTX (GLOVE) ×3 IMPLANT
GOWN STRL REUS W/ TWL LRG LVL3 (GOWN DISPOSABLE) ×2 IMPLANT
GOWN STRL REUS W/TWL LRG LVL3 (GOWN DISPOSABLE) ×4
LENS IOL TECNIS ITEC 26.5 (Intraocular Lens) ×3 IMPLANT
MARKER SKIN DUAL TIP RULER LAB (MISCELLANEOUS) ×3 IMPLANT
NDL RETROBULBAR .5 NSTRL (NEEDLE) IMPLANT
NEEDLE FILTER BLUNT 18X 1/2SAF (NEEDLE) ×2
NEEDLE FILTER BLUNT 18X1 1/2 (NEEDLE) ×1 IMPLANT
PACK CATARACT BRASINGTON (MISCELLANEOUS) ×3 IMPLANT
PACK EYE AFTER SURG (MISCELLANEOUS) ×3 IMPLANT
PACK OPTHALMIC (MISCELLANEOUS) ×3 IMPLANT
RING MALYGIN 7.0 (MISCELLANEOUS) IMPLANT
SUT ETHILON 10-0 CS-B-6CS-B-6 (SUTURE)
SUT VICRYL  9 0 (SUTURE)
SUT VICRYL 9 0 (SUTURE) IMPLANT
SUTURE EHLN 10-0 CS-B-6CS-B-6 (SUTURE) IMPLANT
SYR 3ML LL SCALE MARK (SYRINGE) ×3 IMPLANT
SYR 5ML LL (SYRINGE) ×3 IMPLANT
SYR TB 1ML LUER SLIP (SYRINGE) ×3 IMPLANT
WATER STERILE IRR 250ML POUR (IV SOLUTION) ×3 IMPLANT
WIPE NON LINTING 3.25X3.25 (MISCELLANEOUS) ×3 IMPLANT

## 2016-07-28 NOTE — Anesthesia Preprocedure Evaluation (Signed)
Anesthesia Evaluation  Patient identified by MRN, date of birth, ID band Patient awake    Reviewed: Allergy & Precautions, NPO status , Patient's Chart, lab work & pertinent test results  History of Anesthesia Complications (+) PONV and history of anesthetic complications  Airway Mallampati: II  TM Distance: >3 FB     Dental   Pulmonary neg pulmonary ROS, neg recent URI,    breath sounds clear to auscultation       Cardiovascular hypertension, (-) angina+ CAD   Rhythm:Regular Rate:Normal  Last cardiology appointment 03/2016: "Coronary artery disease involving native coronary artery of native heart without angina pectoris-will continue with dual anti-platelet therapy with aspirin and clopidogrel."   Neuro/Psych TIA   GI/Hepatic GERD  ,IBS History of polyps and rectal bleeding   Endo/Other  diabetes, Type 2Hypothyroidism   Renal/GU      Musculoskeletal   Abdominal   Peds  Hematology   Anesthesia Other Findings   Reproductive/Obstetrics                             Anesthesia Physical  Anesthesia Plan  ASA: III  Anesthesia Plan: MAC   Post-op Pain Management:    Induction:   PONV Risk Score and Plan:   Airway Management Planned:   Additional Equipment:   Intra-op Plan:   Post-operative Plan:   Informed Consent: I have reviewed the patients History and Physical, chart, labs and discussed the procedure including the risks, benefits and alternatives for the proposed anesthesia with the patient or authorized representative who has indicated his/her understanding and acceptance.     Plan Discussed with: CRNA  Anesthesia Plan Comments:         Anesthesia Quick Evaluation  

## 2016-07-28 NOTE — Op Note (Signed)
LOCATION:  [place]   PREOPERATIVE DIAGNOSIS:    Nuclear sclerotic cataract right eye. H25.11   POSTOPERATIVE DIAGNOSIS:  Nuclear sclerotic cataract right eye.     PROCEDURE:  Phacoemusification with posterior chamber intraocular lens placement of the right eye   LENS:   Implant Name Type Inv. Item Serial No. Manufacturer Lot No. LRB No. Used  LENS IOL DIOP 26.5 - [id number] Intraocular Lens LENS IOL DIOP 26.5 [id number] AMO   Right 1        ULTRASOUND TIME: 15 % of 1 minutes, 3 seconds.  CDE 9.7   SURGEON:  Wyonia Hough, MD   ANESTHESIA:  Topical with tetracaine drops and 2% Xylocaine jelly, augmented with 1% preservative-free intracameral lidocaine.    COMPLICATIONS:  None.   DESCRIPTION OF PROCEDURE:  The patient was identified in the holding room and transported to the operating room and placed in the supine position under the operating microscope.  The right eye was identified as the operative eye and it was prepped and draped in the usual sterile ophthalmic fashion.   A 1 millimeter clear-corneal paracentesis was made at the 12:00 position.  0.5 ml of preservative-free 1% lidocaine was injected into the anterior chamber. The anterior chamber was filled with Viscoat viscoelastic.  A 2.4 millimeter keratome was used to make a near-clear corneal incision at the 9:00 position.  A curvilinear capsulorrhexis was made with a cystotome and capsulorrhexis forceps.  Balanced salt solution was used to hydrodissect and hydrodelineate the nucleus.   Phacoemulsification was then used in stop and chop fashion to remove the lens nucleus and epinucleus.  The remaining cortex was then removed using the irrigation and aspiration handpiece. Provisc was then placed into the capsular bag to distend it for lens placement.  A lens was then injected into the capsular bag.  The remaining viscoelastic was aspirated.   Wounds were hydrated with balanced salt solution.  The anterior  chamber was inflated to a physiologic pressure with balanced salt solution.  No wound leaks were noted. Cefuroxime 0.1 ml of a 10mg /ml solution was injected into the anterior chamber for a dose of 1 mg of intracameral antibiotic at the completion of the case.   Timolol and Brimonidine drops were applied to the eye.  The patient was taken to the recovery room in stable condition without complications of anesthesia or surgery.   Etsuko Dierolf 07/28/2016, 10:30 AM

## 2016-07-28 NOTE — Transfer of Care (Signed)
Immediate Anesthesia Transfer of Care Note  Patient: April Pittman  Procedure(s) Performed: Procedure(s) with comments: CATARACT EXTRACTION PHACO AND INTRAOCULAR LENS PLACEMENT (IOC)  Right Diabetic (Right) - Diabetic leave patient at 9 arrival  Patient Location: PACU  Anesthesia Type: MAC  Level of Consciousness: awake, alert  and patient cooperative  Airway and Oxygen Therapy: Patient Spontanous Breathing and Patient connected to supplemental oxygen  Post-op Assessment: Post-op Vital signs reviewed, Patient's Cardiovascular Status Stable, Respiratory Function Stable, Patent Airway and No signs of Nausea or vomiting  Post-op Vital Signs: Reviewed and stable  Complications: No apparent anesthesia complications

## 2016-07-28 NOTE — H&P (Signed)
The History and Physical notes are on paper, have been signed, and are to be scanned. The patient remains stable and unchanged from the H&P.   Previous H&P reviewed, patient examined, and there are no changes.  April Pittman 07/28/2016 9:44 AM

## 2016-07-28 NOTE — Anesthesia Postprocedure Evaluation (Signed)
Anesthesia Post Note  Patient: April Pittman  Procedure(s) Performed: Procedure(s) (LRB): CATARACT EXTRACTION PHACO AND INTRAOCULAR LENS PLACEMENT (IOC)  Right Diabetic (Right)  Patient location during evaluation: PACU Anesthesia Type: MAC Level of consciousness: awake and alert Pain management: pain level controlled Vital Signs Assessment: post-procedure vital signs reviewed and stable Respiratory status: spontaneous breathing, nonlabored ventilation and respiratory function stable Cardiovascular status: stable Anesthetic complications: no    Veda Canning

## 2016-07-28 NOTE — Anesthesia Procedure Notes (Signed)
Procedure Name: MAC Performed by: Mayme Genta Pre-anesthesia Checklist: Patient identified, Emergency Drugs available, Suction available, Timeout performed and Patient being monitored Patient Re-evaluated:Patient Re-evaluated prior to inductionOxygen Delivery Method: Nasal cannula Placement Confirmation: positive ETCO2

## 2016-07-29 ENCOUNTER — Encounter: Payer: Self-pay | Admitting: Ophthalmology

## 2016-08-09 ENCOUNTER — Other Ambulatory Visit
Admission: RE | Admit: 2016-08-09 | Discharge: 2016-08-09 | Disposition: A | Discharge status: Home / Self Care | Payer: PPO | Source: Ambulatory Visit | Attending: Ophthalmology | Admitting: Ophthalmology

## 2016-08-09 DIAGNOSIS — H16001 Unspecified corneal ulcer, right eye: Secondary | ICD-10-CM | POA: Diagnosis not present

## 2016-08-11 LAB — EYE CULTURE: Culture: NO GROWTH

## 2016-08-17 ENCOUNTER — Encounter: Payer: Self-pay | Admitting: Obstetrics and Gynecology

## 2016-08-17 ENCOUNTER — Ambulatory Visit (INDEPENDENT_AMBULATORY_CARE_PROVIDER_SITE_OTHER): Payer: PPO | Admitting: Obstetrics and Gynecology

## 2016-08-17 VITALS — BP 115/66 | HR 80 | Ht 62.0 in | Wt 142.4 lb

## 2016-08-17 DIAGNOSIS — N813 Complete uterovaginal prolapse: Secondary | ICD-10-CM | POA: Diagnosis not present

## 2016-08-17 DIAGNOSIS — Z4689 Encounter for fitting and adjustment of other specified devices: Secondary | ICD-10-CM

## 2016-08-17 NOTE — Progress Notes (Signed)
Chief complaint: 1. Pessary maintenance 2. Procidentia 3. Cystocele  Patient presents for  pessary check. (Last visit 04/27/2016) She is not experiencing any difficulties. She is capable of removing and reinserting the pessary (gel horn). No vaginal discharge, vaginal bleeding, or pelvic pain. Bowel and bladder function are normal. She is using Estrace cream once a week. She is using Trimosan gel weekly  Past medical history, past surgical history, problem list, medications, and allergies are reviewed Dementia changes are an ongoing issue.  OBJECTIVE: BP 115/66   Pulse 80   Ht 5\' 2"  (1.575 m)   Wt 142 lb 6.4 oz (64.6 kg)   BMI 26.05 kg/m  Pleasant female in no acute distress. Gel horn pessary is out at the present time PELVIC: External Genitalia: Normal BUS: Normal Vagina: Mildly atrophic; cystocele present Cervix: Parous; No cervical motion tenderness; 1 cm cervical polyp at 5:00 on ectocervix Uterus: Normal size, shape,consistency, mobile RV: Normal External exam Bladder: Nontender  PROCEDURE: Gellhorn pessary is inserted  ASSESSMENT: 1. Procidentia. 2. Traction cystocele. 3. Normal pessary maintenance evaluation  PLAN: 1. Return in 3 months for pessary maintenance. 2. Continue with Estrace cream once or twice a week. Intravaginal 3. Continue with Trimo San gel weekly  A total of 15 minutes were spent face-to-face with the patient during this encounter and over half of that time dealt with counseling and coordination of care.  Brayton Mars, MD  Note: This dictation was prepared with Dragon dictation along with smaller phrase technology. Any transcriptional errors that result from this process are unintentional.

## 2016-08-17 NOTE — Patient Instructions (Signed)
1.  Return in 3 months for pessary maintenance 

## 2016-08-30 LAB — CULTURE, FUNGUS WITHOUT SMEAR

## 2016-09-17 DIAGNOSIS — H353211 Exudative age-related macular degeneration, right eye, with active choroidal neovascularization: Secondary | ICD-10-CM | POA: Diagnosis not present

## 2016-09-28 DIAGNOSIS — H16001 Unspecified corneal ulcer, right eye: Secondary | ICD-10-CM | POA: Diagnosis not present

## 2016-10-02 ENCOUNTER — Other Ambulatory Visit: Payer: Self-pay | Admitting: Family Medicine

## 2016-10-05 ENCOUNTER — Ambulatory Visit (INDEPENDENT_AMBULATORY_CARE_PROVIDER_SITE_OTHER): Payer: PPO | Admitting: Family Medicine

## 2016-10-05 ENCOUNTER — Encounter: Payer: Self-pay | Admitting: Family Medicine

## 2016-10-05 VITALS — BP 112/60 | HR 70 | Temp 97.6°F | Resp 16 | Wt 145.0 lb

## 2016-10-05 DIAGNOSIS — I2510 Atherosclerotic heart disease of native coronary artery without angina pectoris: Secondary | ICD-10-CM | POA: Diagnosis not present

## 2016-10-05 DIAGNOSIS — E119 Type 2 diabetes mellitus without complications: Secondary | ICD-10-CM | POA: Diagnosis not present

## 2016-10-05 DIAGNOSIS — I10 Essential (primary) hypertension: Secondary | ICD-10-CM | POA: Diagnosis not present

## 2016-10-05 DIAGNOSIS — M810 Age-related osteoporosis without current pathological fracture: Secondary | ICD-10-CM | POA: Diagnosis not present

## 2016-10-05 LAB — POCT UA - MICROALBUMIN: MICROALBUMIN (UR) POC: 20 mg/L

## 2016-10-05 LAB — POCT GLYCOSYLATED HEMOGLOBIN (HGB A1C)
Est. average glucose Bld gHb Est-mCnc: 169
HEMOGLOBIN A1C: 7.5

## 2016-10-05 NOTE — Progress Notes (Signed)
Patient: April Pittman Female    DOB: 1940/08/17   76 y.o.   MRN: [id number] Visit Date: 10/05/2016  Today's Provider: [name], MD   Chief Complaint  Patient presents with  . Diabetes    follow up  . Hypertension    follow up  . Coronary Artery Disease    follow up   Subjective:    HPI  Diabetes Mellitus Type II, Follow-up:   Lab Results  Component Value Date   HGBA1C 8.0 04/28/2016   HGBA1C 8.0 12/31/2015   HGBA1C 7.9 09/29/2015    Last seen for diabetes 4 months ago.  Management since then includes no changes. She reports poor compliance with treatment. Did not start taking Farxiga due to concerns of possible side effects. She is not having side effects.  Current symptoms include none and have been stable. Home blood sugar records: varies per patient report  Episodes of hypoglycemia? no   Current Insulin Regimen: none Most Recent Eye Exam: < 1 year ago Weight trend: fluctuating a bit Prior visit with dietician: no Current diet: in general, an "unhealthy" diet Current exercise: none  Pertinent Labs:    Component Value Date/Time   CHOL 147 04/28/2016 1022   TRIG 196 (H) 04/28/2016 1022   HDL 47 04/28/2016 1022   LDLCALC 61 04/28/2016 1022   CREATININE 0.55 (L) 06/09/2016 1127    Wt Readings from Last 3 Encounters:  08/17/16 142 lb 6.4 oz (64.6 kg)  07/20/16 146 lb (66.2 kg)  06/09/16 146 lb (66.2 kg)    ------------------------------------------------------------------------  Hypertension, follow-up:  BP Readings from Last 3 Encounters:  08/17/16 115/66  07/28/16 (!) 112/59  06/09/16 112/62    She was last seen for hypertension 5 months ago.  BP at that visit was 112/62. Management since that visit includes no changes. She reports good compliance with treatment. She is not having side effects.  She is not exercising. She is not adherent to low salt diet.   Outside blood pressures are not being checked. She is experiencing  none.  Patient denies chest pain, chest pressure/discomfort, claudication, dyspnea, exertional chest pressure/discomfort, fatigue, irregular heart beat, lower extremity edema, near-syncope, orthopnea, palpitations, paroxysmal nocturnal dyspnea, syncope and tachypnea.   Cardiovascular risk factors include advanced age (older than 48 for men, 40 for women), diabetes mellitus and hypertension.  Use of agents associated with hypertension: NSAIDS.     Weight trend: fluctuating a bit Wt Readings from Last 3 Encounters:  08/17/16 142 lb 6.4 oz (64.6 kg)  07/20/16 146 lb (66.2 kg)  06/09/16 146 lb (66.2 kg)    Current diet: in general, an "unhealthy" diet  ------------------------------------------------------------------------ Follow up of CAD:  Patient was last seen for this problem 4 months ago and no changes were made. Patient reports good compliance with treatment. Continue regular follow up Dr. Ubaldo Glassing.   Hypothyroidism:  Patient was last seen for this problem 5 months ago and no changes were made. Patient reports good compliance with treatment.   Osteoporosis follow  Up.  Due for BMD. Is lot taking bisphosphonate at this time.     No Known Allergies   Current Outpatient Prescriptions:  .  amLODipine (NORVASC) 5 MG tablet, TAKE 1 TABLET (5 MG TOTAL) BY MOUTH DAILY., Disp: 30 tablet, Rfl: 12 .  aspirin EC 81 MG tablet, Take 81 mg by mouth daily., Disp: , Rfl:  .  Blood Glucose Monitoring Suppl (ONE TOUCH ULTRA MINI) W/DEVICE KIT, , Disp: ,  Rfl:  .  cholecalciferol (VITAMIN D) 400 UNITS TABS tablet, Take 1,000 Units by mouth daily., Disp: , Rfl:  .  clopidogrel (PLAVIX) 75 MG tablet, Take 75 mg by mouth daily., Disp: , Rfl:  .  estradiol (ESTRACE) 0.1 MG/GM vaginal cream, Place 1 Applicatorful vaginally once a week., Disp: , Rfl:  .  glucose blood (ONE TOUCH ULTRA TEST) test strip, Use to check blood sugar daily. E 11.9 (Type 2 diabetes mellitis), Disp: 100 each, Rfl: 3 .  levothyroxine  (SYNTHROID, LEVOTHROID) 125 MCG tablet, TAKE ONE TABLET BY MOUTH IN THE MORNING ON EMPTY STOMACH FOR THYROID, Disp: 90 tablet, Rfl: 3 .  lisinopril-hydrochlorothiazide (PRINZIDE,ZESTORETIC) 20-25 MG tablet, TAKE ONE TABLET BY MOUTH ONCE DAILY, Disp: 90 tablet, Rfl: 3 .  metFORMIN (GLUCOPHAGE) 1000 MG tablet, TAKE ONE TABLET BY MOUTH TWICE DAILY, Disp: 180 tablet, Rfl: 3 .  metoprolol (LOPRESSOR) 50 MG tablet, Take 50 mg by mouth daily., Disp: , Rfl:  .  Multiple Vitamins-Minerals (PRESERVISION AREDS 2 PO), Take 2 capsules by mouth daily., Disp: , Rfl:  .  nitroGLYCERIN (NITROSTAT) 0.4 MG SL tablet, Place 1 tablet (0.4 mg total) under the tongue every 5 (five) minutes as needed for chest pain., Disp: 30 tablet, Rfl: 0 .  ONETOUCH DELICA LANCETS FINE MISC, , Disp: , Rfl:  .  OXYQUINOLONE SULFATE VAGINAL 0.025 % GEL, Place 1 application vaginally once a week., Disp: , Rfl:  .  simvastatin (ZOCOR) 40 MG tablet, TAKE 1 TABLET BY MOUTH EVERY DAY FOR CHOLESTEROL, Disp: 30 tablet, Rfl: 12 .  traZODone (DESYREL) 100 MG tablet, Take 1 tablet (100 mg total) by mouth at bedtime., Disp: 30 tablet, Rfl: 2 .  vitamin B-12 (CYANOCOBALAMIN) 1000 MCG tablet, Take by mouth., Disp: , Rfl:   Review of Systems  Constitutional: Negative for appetite change, chills, fatigue and fever.  Eyes: Positive for visual disturbance.  Respiratory: Negative for chest tightness and shortness of breath.   Cardiovascular: Negative for chest pain and palpitations.  Gastrointestinal: Negative for abdominal pain, nausea and vomiting.  Neurological: Negative for dizziness and weakness.    Social History  Substance Use Topics  . Smoking status: Never Smoker  . Smokeless tobacco: Never Used  . Alcohol use No   Objective:   BP 112/60 (BP Location: Left Arm, Patient Position: Sitting, Cuff Size: Normal)   Pulse 70   Temp 97.6 F (36.4 C) (Oral)   Resp 16   Wt 145 lb (65.8 kg)   SpO2 98% Comment: room air  BMI 26.52 kg/m    There were no vitals filed for this visit.   Physical Exam   General Appearance:    Alert, cooperative, no distress  Eyes:    PERRL, conjunctiva/corneas clear, EOM's intact       Lungs:     Clear to auscultation bilaterally, respirations unlabored  Heart:    Regular rate and rhythm  Neurologic:   Awake, alert, oriented x 3. No apparent focal neurological           defect.       Results for orders placed or performed in visit on 10/05/16  POCT HgB A1C  Result Value Ref Range   Hemoglobin A1C 7.5    Est. average glucose Bld gHb Est-mCnc 169   POCT UA - Microalbumin  Result Value Ref Range   Microalbumin Ur, POC 20 mg/L   Creatinine, POC n/a mg/dL   Albumin/Creatinine Ratio, Urine, POC n/a  Assessment & Plan:     1. Type 2 diabetes mellitus without complication, without long-term current use of insulin ([facility]) Improved with dietary changes.  - POCT HgB A1C - POCT UA - Microalbumin  2. Essential hypertension Well controlled.  Continue current medications.    3. Coronary artery disease involving native coronary artery of native heart without angina pectoris Continue regular cardiology follow up. She did not start [place] due to legal adds she saw on TV.   4. Osteoporosis, unspecified osteoporosis type, unspecified pathological fracture presence  - DG Bone Density; Future       [name], MD  [facility]

## 2016-10-08 ENCOUNTER — Other Ambulatory Visit: Payer: Self-pay | Admitting: Family Medicine

## 2016-10-14 DIAGNOSIS — H2512 Age-related nuclear cataract, left eye: Secondary | ICD-10-CM | POA: Diagnosis not present

## 2016-10-19 DIAGNOSIS — I10 Essential (primary) hypertension: Secondary | ICD-10-CM | POA: Diagnosis not present

## 2016-10-19 DIAGNOSIS — E119 Type 2 diabetes mellitus without complications: Secondary | ICD-10-CM | POA: Diagnosis not present

## 2016-10-19 DIAGNOSIS — E7800 Pure hypercholesterolemia, unspecified: Secondary | ICD-10-CM | POA: Diagnosis not present

## 2016-10-19 DIAGNOSIS — I2510 Atherosclerotic heart disease of native coronary artery without angina pectoris: Secondary | ICD-10-CM | POA: Diagnosis not present

## 2016-10-20 ENCOUNTER — Encounter: Payer: Self-pay | Admitting: *Deleted

## 2016-10-21 NOTE — Discharge Instructions (Signed)

## 2016-10-27 ENCOUNTER — Ambulatory Visit: Payer: PPO | Admitting: Anesthesiology

## 2016-10-27 ENCOUNTER — Ambulatory Visit
Admission: RE | Admit: 2016-10-27 | Discharge: 2016-10-27 | Disposition: A | Discharge status: Home / Self Care | Payer: PPO | Source: Ambulatory Visit | Attending: Ophthalmology | Admitting: Ophthalmology

## 2016-10-27 ENCOUNTER — Encounter
Admission: RE | Disposition: A | Discharge status: Home / Self Care | Payer: Self-pay | Source: Ambulatory Visit | Attending: Ophthalmology

## 2016-10-27 DIAGNOSIS — Z8673 Personal history of transient ischemic attack (TIA), and cerebral infarction without residual deficits: Secondary | ICD-10-CM | POA: Diagnosis not present

## 2016-10-27 DIAGNOSIS — Z7984 Long term (current) use of oral hypoglycemic drugs: Secondary | ICD-10-CM | POA: Insufficient documentation

## 2016-10-27 DIAGNOSIS — E7800 Pure hypercholesterolemia, unspecified: Secondary | ICD-10-CM | POA: Diagnosis not present

## 2016-10-27 DIAGNOSIS — Z79899 Other long term (current) drug therapy: Secondary | ICD-10-CM | POA: Diagnosis not present

## 2016-10-27 DIAGNOSIS — E119 Type 2 diabetes mellitus without complications: Secondary | ICD-10-CM | POA: Diagnosis not present

## 2016-10-27 DIAGNOSIS — Z955 Presence of coronary angioplasty implant and graft: Secondary | ICD-10-CM | POA: Insufficient documentation

## 2016-10-27 DIAGNOSIS — H2512 Age-related nuclear cataract, left eye: Secondary | ICD-10-CM | POA: Diagnosis not present

## 2016-10-27 DIAGNOSIS — I2510 Atherosclerotic heart disease of native coronary artery without angina pectoris: Secondary | ICD-10-CM | POA: Insufficient documentation

## 2016-10-27 DIAGNOSIS — E039 Hypothyroidism, unspecified: Secondary | ICD-10-CM | POA: Insufficient documentation

## 2016-10-27 DIAGNOSIS — Z7982 Long term (current) use of aspirin: Secondary | ICD-10-CM | POA: Insufficient documentation

## 2016-10-27 DIAGNOSIS — I252 Old myocardial infarction: Secondary | ICD-10-CM | POA: Diagnosis not present

## 2016-10-27 DIAGNOSIS — I10 Essential (primary) hypertension: Secondary | ICD-10-CM | POA: Diagnosis not present

## 2016-10-27 HISTORY — PX: CATARACT EXTRACTION W/PHACO: SHX586

## 2016-10-27 LAB — GLUCOSE, CAPILLARY: Glucose-Capillary: 112 mg/dL — ABNORMAL HIGH (ref 65–99)

## 2016-10-27 SURGERY — PHACOEMULSIFICATION, CATARACT, WITH IOL INSERTION
Anesthesia: Monitor Anesthesia Care | Laterality: Left | Wound class: Clean

## 2016-10-27 MED ORDER — NA HYALUR & NA CHOND-NA HYALUR 0.4-0.35 ML IO KIT
PACK | INTRAOCULAR | Status: DC | PRN
  Administered 2016-10-27: 1 mL via INTRAOCULAR

## 2016-10-27 MED ORDER — CEFUROXIME OPHTHALMIC INJECTION 1 MG/0.1 ML
INJECTION | OPHTHALMIC | Status: DC | PRN
  Administered 2016-10-27: 0.1 mL via OPHTHALMIC

## 2016-10-27 MED ORDER — ERYTHROMYCIN 5 MG/GM OP OINT
TOPICAL_OINTMENT | OPHTHALMIC | Status: DC | PRN
  Administered 2016-10-27: 1 via OPHTHALMIC

## 2016-10-27 MED ORDER — EPINEPHRINE PF 1 MG/ML IJ SOLN
INTRAOCULAR | Status: DC | PRN
  Administered 2016-10-27: 51 mL via OPHTHALMIC

## 2016-10-27 MED ORDER — MIDAZOLAM HCL 2 MG/2ML IJ SOLN
INTRAMUSCULAR | Status: DC | PRN
  Administered 2016-10-27: 2 mg via INTRAVENOUS

## 2016-10-27 MED ORDER — BRIMONIDINE TARTRATE-TIMOLOL 0.2-0.5 % OP SOLN
OPHTHALMIC | Status: DC | PRN
  Administered 2016-10-27: 1 [drp] via OPHTHALMIC

## 2016-10-27 MED ORDER — MOXIFLOXACIN HCL 0.5 % OP SOLN
1.0000 [drp] | OPHTHALMIC | Status: DC | PRN
  Administered 2016-10-27 (×3): 1 [drp] via OPHTHALMIC

## 2016-10-27 MED ORDER — LACTATED RINGERS IV SOLN: INTRAVENOUS | Status: DC

## 2016-10-27 MED ORDER — ONDANSETRON HCL 4 MG/2ML IJ SOLN: 4.0000 mg | Freq: Once | INTRAMUSCULAR | Status: DC | PRN

## 2016-10-27 MED ORDER — BALANCED SALT IO SOLN
INTRAOCULAR | Status: DC | PRN
  Administered 2016-10-27: 1 mL via INTRAOCULAR

## 2016-10-27 MED ORDER — ACETAMINOPHEN 160 MG/5ML PO SOLN: 325.0000 mg | ORAL | Status: DC | PRN

## 2016-10-27 MED ORDER — ARMC OPHTHALMIC DILATING DROPS
1.0000 "application " | OPHTHALMIC | Status: DC | PRN
  Administered 2016-10-27 (×3): 1 via OPHTHALMIC

## 2016-10-27 MED ORDER — ACETAMINOPHEN 325 MG PO TABS: 650.0000 mg | ORAL_TABLET | Freq: Once | ORAL | Status: DC | PRN

## 2016-10-27 MED ORDER — FENTANYL CITRATE (PF) 100 MCG/2ML IJ SOLN
INTRAMUSCULAR | Status: DC | PRN
Start: 2016-10-27 — End: 2016-10-27
  Administered 2016-10-27: 50 ug via INTRAVENOUS

## 2016-10-27 SURGICAL SUPPLY — 25 items
CANNULA ANT/CHMB 27GA (MISCELLANEOUS) ×3 IMPLANT
CARTRIDGE ABBOTT (MISCELLANEOUS) IMPLANT
GLOVE SURG LX 7.5 STRW (GLOVE) ×2
GLOVE SURG LX STRL 7.5 STRW (GLOVE) ×1 IMPLANT
GLOVE SURG TRIUMPH 8.0 PF LTX (GLOVE) ×3 IMPLANT
GOWN STRL REUS W/ TWL LRG LVL3 (GOWN DISPOSABLE) ×2 IMPLANT
GOWN STRL REUS W/TWL LRG LVL3 (GOWN DISPOSABLE) ×4
LENS IOL TECNIS ITEC 27.0 (Intraocular Lens) ×3 IMPLANT
MARKER SKIN DUAL TIP RULER LAB (MISCELLANEOUS) ×3 IMPLANT
NDL RETROBULBAR .5 NSTRL (NEEDLE) IMPLANT
NEEDLE FILTER BLUNT 18X 1/2SAF (NEEDLE) ×2
NEEDLE FILTER BLUNT 18X1 1/2 (NEEDLE) ×1 IMPLANT
PACK CATARACT BRASINGTON (MISCELLANEOUS) ×3 IMPLANT
PACK EYE AFTER SURG (MISCELLANEOUS) ×3 IMPLANT
PACK OPTHALMIC (MISCELLANEOUS) ×3 IMPLANT
RING MALYGIN 7.0 (MISCELLANEOUS) IMPLANT
SUT ETHILON 10-0 CS-B-6CS-B-6 (SUTURE)
SUT VICRYL  9 0 (SUTURE)
SUT VICRYL 9 0 (SUTURE) IMPLANT
SUTURE EHLN 10-0 CS-B-6CS-B-6 (SUTURE) IMPLANT
SYR 3ML LL SCALE MARK (SYRINGE) ×3 IMPLANT
SYR 5ML LL (SYRINGE) ×3 IMPLANT
SYR TB 1ML LUER SLIP (SYRINGE) ×3 IMPLANT
WATER STERILE IRR 250ML POUR (IV SOLUTION) ×3 IMPLANT
WIPE NON LINTING 3.25X3.25 (MISCELLANEOUS) ×3 IMPLANT

## 2016-10-27 NOTE — Anesthesia Preprocedure Evaluation (Signed)
Anesthesia Evaluation  Patient identified by MRN, date of birth, ID band Patient awake    Reviewed: Allergy & Precautions, NPO status , Patient's Chart, lab work & pertinent test results  History of Anesthesia Complications (+) PONV and history of anesthetic complications  Airway Mallampati: II  TM Distance: >3 FB     Dental   Pulmonary neg pulmonary ROS, neg recent URI,    breath sounds clear to auscultation       Cardiovascular hypertension, (-) angina+ CAD   Rhythm:Regular Rate:Normal  Last cardiology appointment 03/2016: "Coronary artery disease involving native coronary artery of native heart without angina pectoris-will continue with dual anti-platelet therapy with aspirin and clopidogrel."   Neuro/Psych TIA   GI/Hepatic GERD  ,IBS History of polyps and rectal bleeding   Endo/Other  diabetes, Type 2Hypothyroidism   Renal/GU      Musculoskeletal   Abdominal   Peds  Hematology   Anesthesia Other Findings   Reproductive/Obstetrics                             Anesthesia Physical  Anesthesia Plan  ASA: III  Anesthesia Plan: MAC   Post-op Pain Management:    Induction:   PONV Risk Score and Plan:   Airway Management Planned:   Additional Equipment:   Intra-op Plan:   Post-operative Plan:   Informed Consent: I have reviewed the patients History and Physical, chart, labs and discussed the procedure including the risks, benefits and alternatives for the proposed anesthesia with the patient or authorized representative who has indicated his/her understanding and acceptance.     Plan Discussed with: CRNA  Anesthesia Plan Comments:         Anesthesia Quick Evaluation

## 2016-10-27 NOTE — Anesthesia Postprocedure Evaluation (Signed)
Anesthesia Post Note  Patient: April Pittman  Procedure(s) Performed: Procedure(s) (LRB): CATARACT EXTRACTION PHACO AND INTRAOCULAR LENS PLACEMENT (IOC) LEFT DIABETIC (Left)  Patient location during evaluation: PACU Anesthesia Type: MAC Level of consciousness: awake Pain management: pain level controlled Vital Signs Assessment: post-procedure vital signs reviewed and stable Respiratory status: spontaneous breathing, nonlabored ventilation and respiratory function stable Cardiovascular status: stable Postop Assessment: no signs of nausea or vomiting Anesthetic complications: no    Veda Canning

## 2016-10-27 NOTE — Op Note (Signed)
OPERATIVE NOTE  FLETA BORGESON [id number] 10/27/2016   PREOPERATIVE DIAGNOSIS:  Nuclear sclerotic cataract left eye. H25.12   POSTOPERATIVE DIAGNOSIS:    Nuclear sclerotic cataract left eye.     PROCEDURE:  Phacoemusification with posterior chamber intraocular lens placement of the left eye   LENS:   Implant Name Type Inv. Item Serial No. Manufacturer Lot No. LRB No. Used  LENS IOL DIOP 27.0 - [id number] Intraocular Lens LENS IOL DIOP 27.0 [id number] AMO   Left 1        ULTRASOUND TIME: 15  % of 0 minutes 50 seconds, CDE 7.6  SURGEON:  Wyonia Hough, MD   ANESTHESIA:  Topical with tetracaine drops and 2% Xylocaine jelly, augmented with 1% preservative-free intracameral lidocaine.    COMPLICATIONS:  None.   DESCRIPTION OF PROCEDURE:  The patient was identified in the holding room and transported to the operating room and placed in the supine position under the operating microscope.  The left eye was identified as the operative eye and it was prepped and draped in the usual sterile ophthalmic fashion.   A 1 millimeter clear-corneal paracentesis was made at the 1:30 position.  0.5 ml of preservative-free 1% lidocaine was injected into the anterior chamber.  The anterior chamber was filled with Viscoat viscoelastic.  A 2.4 millimeter keratome was used to make a near-clear corneal incision at the 10:30 position.  .  A curvilinear capsulorrhexis was made with a cystotome and capsulorrhexis forceps.  Balanced salt solution was used to hydrodissect and hydrodelineate the nucleus.   Phacoemulsification was then used in stop and chop fashion to remove the lens nucleus and epinucleus.  The remaining cortex was then removed using the irrigation and aspiration handpiece. Provisc was then placed into the capsular bag to distend it for lens placement.  A lens was then injected into the capsular bag.  The remaining viscoelastic was aspirated.   Wounds were hydrated with balanced salt  solution.  The anterior chamber was inflated to a physiologic pressure with balanced salt solution.  No wound leaks were noted. Cefuroxime 0.1 ml of a 10mg /ml solution was injected into the anterior chamber for a dose of 1 mg of intracameral antibiotic at the completion of the case.   Timolol and Brimonidine drops were applied to the eye.  The patient was taken to the recovery room in stable condition without complications of anesthesia or surgery.  Louay Myrie 10/27/2016, 11:07 AM

## 2016-10-27 NOTE — Anesthesia Procedure Notes (Signed)
Procedure Name: MAC Performed by: Mayme Genta Pre-anesthesia Checklist: Patient identified, Emergency Drugs available, Suction available, Timeout performed and Patient being monitored Patient Re-evaluated:Patient Re-evaluated prior to induction Oxygen Delivery Method: Nasal cannula Placement Confirmation: positive ETCO2

## 2016-10-27 NOTE — Transfer of Care (Signed)
Immediate Anesthesia Transfer of Care Note  Patient: April Pittman  Procedure(s) Performed: Procedure(s): CATARACT EXTRACTION PHACO AND INTRAOCULAR LENS PLACEMENT (IOC) LEFT DIABETIC (Left)  Patient Location: PACU  Anesthesia Type: MAC  Level of Consciousness: awake, alert  and patient cooperative  Airway and Oxygen Therapy: Patient Spontanous Breathing and Patient connected to supplemental oxygen  Post-op Assessment: Post-op Vital signs reviewed, Patient's Cardiovascular Status Stable, Respiratory Function Stable, Patent Airway and No signs of Nausea or vomiting  Post-op Vital Signs: Reviewed and stable  Complications: No apparent anesthesia complications

## 2016-10-27 NOTE — H&P (Signed)
The History and Physical notes are on paper, have been signed, and are to be scanned. The patient remains stable and unchanged from the H&P.   Previous H&P reviewed, patient examined, and there are no changes.  April Pittman 10/27/2016 10:11 AM

## 2016-10-28 ENCOUNTER — Encounter: Payer: Self-pay | Admitting: Ophthalmology

## 2016-11-05 DIAGNOSIS — H353211 Exudative age-related macular degeneration, right eye, with active choroidal neovascularization: Secondary | ICD-10-CM | POA: Diagnosis not present

## 2016-11-16 ENCOUNTER — Ambulatory Visit (INDEPENDENT_AMBULATORY_CARE_PROVIDER_SITE_OTHER): Payer: PPO

## 2016-11-16 VITALS — BP 134/64 | HR 88 | Temp 98.5°F | Ht 63.0 in | Wt 147.4 lb

## 2016-11-16 DIAGNOSIS — Z0000 Encounter for general adult medical examination without abnormal findings: Secondary | ICD-10-CM | POA: Diagnosis not present

## 2016-11-16 NOTE — Patient Instructions (Signed)
April Pittman , Thank you for taking time to come for your Medicare Wellness Visit. I appreciate your ongoing commitment to your health goals. Please review the following plan we discussed and let me know if I can assist you in the future.   Screening recommendations/referrals: Colonoscopy: up to date Mammogram: up to date Bone Density: pt states she will have this done tomorrow Recommended yearly ophthalmology/optometry visit for glaucoma screening and checkup Recommended yearly dental visit for hygiene and checkup  Vaccinations: Influenza vaccine: declined Pneumococcal vaccine: completed series Tdap vaccine: declined Shingles vaccine: completed 09/15/15  Advanced directives: Pt to check with husband about if this has been completed and will follow up with [place] at next OV.  Conditions/risks identified: Fall risk prevention; Recommend increasing water intake to 6-8 glasses of water a day.   Next appointment: 02/04/17   Preventive Care 65 Years and Older, Female Preventive care refers to lifestyle choices and visits with your health care provider that can promote health and wellness. What does preventive care include?  A yearly physical exam. This is also called an annual well check.  Dental exams once or twice a year.  Routine eye exams. Ask your health care provider how often you should have your eyes checked.  Personal lifestyle choices, including:  Daily care of your teeth and gums.  Regular physical activity.  Eating a healthy diet.  Avoiding tobacco and drug use.  Limiting alcohol use.  Practicing safe sex.  Taking low-dose aspirin every day.  Taking vitamin and mineral supplements as recommended by your health care provider. What happens during an annual well check? The services and screenings done by your health care provider during your annual well check will depend on your age, overall health, lifestyle risk factors, and family history of disease. Counseling    Your health care provider may ask you questions about your:  Alcohol use.  Tobacco use.  Drug use.  Emotional well-being.  Home and relationship well-being.  Sexual activity.  Eating habits.  History of falls.  Memory and ability to understand (cognition).  Work and work Statistician.  Reproductive health. Screening  You may have the following tests or measurements:  Height, weight, and BMI.  Blood pressure.  Lipid and cholesterol levels. These may be checked every 5 years, or more frequently if you are over 26 years old.  Skin check.  Lung cancer screening. You may have this screening every year starting at age 76 if you have a 30-pack-year history of smoking and currently smoke or have quit within the past 15 years.  Fecal occult blood test (FOBT) of the stool. You may have this test every year starting at age 76.  Flexible sigmoidoscopy or colonoscopy. You may have a sigmoidoscopy every 5 years or a colonoscopy every 10 years starting at age 10.  Hepatitis C blood test.  Hepatitis B blood test.  Sexually transmitted disease (STD) testing.  Diabetes screening. This is done by checking your blood sugar (glucose) after you have not eaten for a while (fasting). You may have this done every 1-3 years.  Bone density scan. This is done to screen for osteoporosis. You may have this done starting at age 76.  Mammogram. This may be done every 1-2 years. Talk to your health care provider about how often you should have regular mammograms. Talk with your health care provider about your test results, treatment options, and if necessary, the need for more tests. Vaccines  Your health care provider may recommend certain vaccines,  such as:  Influenza vaccine. This is recommended every year.  Tetanus, diphtheria, and acellular pertussis (Tdap, Td) vaccine. You may need a Td booster every 10 years.  Zoster vaccine. You may need this after age 37.  Pneumococcal 13-valent  conjugate (PCV13) vaccine. One dose is recommended after age 15.  Pneumococcal polysaccharide (PPSV23) vaccine. One dose is recommended after age 62. Talk to your health care provider about which screenings and vaccines you need and how often you need them. This information is not intended to replace advice given to you by your health care provider. Make sure you discuss any questions you have with your health care provider. Document Released: 03/07/2015 Document Revised: 10/29/2015 Document Reviewed: 12/10/2014 Elsevier Interactive Patient Education  2017 [place] Prevention in the Home Falls can cause injuries. They can happen to people of all ages. There are many things you can do to make your home safe and to help prevent falls. What can I do on the outside of my home?  Regularly fix the edges of walkways and driveways and fix any cracks.  Remove anything that might make you trip as you walk through a door, such as a raised step or threshold.  Trim any bushes or trees on the path to your home.  Use bright outdoor lighting.  Clear any walking paths of anything that might make someone trip, such as rocks or tools.  Regularly check to see if handrails are loose or broken. Make sure that both sides of any steps have handrails.  Any raised decks and porches should have guardrails on the edges.  Have any leaves, snow, or ice cleared regularly.  Use sand or salt on walking paths during winter.  Clean up any spills in your garage right away. This includes oil or grease spills. What can I do in the bathroom?  Use night lights.  Install grab bars by the toilet and in the tub and shower. Do not use towel bars as grab bars.  Use non-skid mats or decals in the tub or shower.  If you need to sit down in the shower, use a plastic, non-slip stool.  Keep the floor dry. Clean up any water that spills on the floor as soon as it happens.  Remove soap buildup in the tub or  shower regularly.  Attach bath mats securely with double-sided non-slip rug tape.  Do not have throw rugs and other things on the floor that can make you trip. What can I do in the bedroom?  Use night lights.  Make sure that you have a light by your bed that is easy to reach.  Do not use any sheets or blankets that are too big for your bed. They should not hang down onto the floor.  Have a firm chair that has side arms. You can use this for support while you get dressed.  Do not have throw rugs and other things on the floor that can make you trip. What can I do in the kitchen?  Clean up any spills right away.  Avoid walking on wet floors.  Keep items that you use a lot in easy-to-reach places.  If you need to reach something above you, use a strong step stool that has a grab bar.  Keep electrical cords out of the way.  Do not use floor polish or wax that makes floors slippery. If you must use wax, use non-skid floor wax.  Do not have throw rugs and other things on  the floor that can make you trip. What can I do with my stairs?  Do not leave any items on the stairs.  Make sure that there are handrails on both sides of the stairs and use them. Fix handrails that are broken or loose. Make sure that handrails are as long as the stairways.  Check any carpeting to make sure that it is firmly attached to the stairs. Fix any carpet that is loose or worn.  Avoid having throw rugs at the top or bottom of the stairs. If you do have throw rugs, attach them to the floor with carpet tape.  Make sure that you have a light switch at the top of the stairs and the bottom of the stairs. If you do not have them, ask someone to add them for you. What else can I do to help prevent falls?  Wear shoes that:  Do not have high heels.  Have rubber bottoms.  Are comfortable and fit you well.  Are closed at the toe. Do not wear sandals.  If you use a stepladder:  Make sure that it is fully  opened. Do not climb a closed stepladder.  Make sure that both sides of the stepladder are locked into place.  Ask someone to hold it for you, if possible.  Clearly mark and make sure that you can see:  Any grab bars or handrails.  First and last steps.  Where the edge of each step is.  Use tools that help you move around (mobility aids) if they are needed. These include:  Canes.  Walkers.  Scooters.  Crutches.  Turn on the lights when you go into a dark area. Replace any light bulbs as soon as they burn out.  Set up your furniture so you have a clear path. Avoid moving your furniture around.  If any of your floors are uneven, fix them.  If there are any pets around you, be aware of where they are.  Review your medicines with your doctor. Some medicines can make you feel dizzy. This can increase your chance of falling. Ask your doctor what other things that you can do to help prevent falls. This information is not intended to replace advice given to you by your health care provider. Make sure you discuss any questions you have with your health care provider. Document Released: 12/05/2008 Document Revised: 07/17/2015 Document Reviewed: 03/15/2014 Elsevier Interactive Patient Education  2017 [employer].

## 2016-11-16 NOTE — Progress Notes (Signed)
Subjective:   April Pittman is a 76 y.o. female who presents for Medicare Annual (Subsequent) preventive examination.  Review of Systems:  N/A  Cardiac Risk Factors include: advanced age (>74mn, >>58women);diabetes mellitus;dyslipidemia;hypertension     Objective:     Vitals: BP 134/64 (BP Location: Left Arm)   Pulse 88   Temp 98.5 F (36.9 C) (Oral)   Ht 5' 3"  (1.6 m)   Wt 147 lb 6.4 oz (66.9 kg)   BMI 26.11 kg/m   Body mass index is 26.11 kg/m.   Tobacco History  Smoking Status  . Never Smoker  Smokeless Tobacco  . Never Used     Counseling given: Not Answered   Past Medical History:  Diagnosis Date  . Alopecia   . Anginal pain ([facility]   . Benign neoplasm of large bowel   . CAD (coronary artery disease)   . Carpal tunnel syndrome   . Complication of anesthesia    vomits with any pain medicine  . DM (diabetes mellitus) ([facility]   . Erosion of cervix   . GERD (gastroesophageal reflux disease)   . H/O adenomatous polyp of colon    with sever dysplasia, removed 2000 by colonoscopy  . History of chicken pox   . Hypercholesteremia   . Hypertension   . Hypothyroid   . Irritable bowel   . Malignant melanoma of foot ([facility]   . Osteoporosis   . PONV (postoperative nausea and vomiting)   . Post-menopausal bleeding   . Procidentia of uterus    with traction cystocele, managed with gellhorn pessary  . Rectal bleeding   . TIA (transient ischemic attack)    Past Surgical History:  Procedure Laterality Date  . BREAST EXCISIONAL BIOPSY Right    Benign  . CARDIAC CATHETERIZATION  10/2010   +stenting to LAD and RCA; symptoms: chest pain, elevated Troponin, +AMI. [employer] . CATARACT EXTRACTION W/PHACO Right 07/28/2016   Procedure: CATARACT EXTRACTION PHACO AND INTRAOCULAR LENS PLACEMENT ([place]  Right Diabetic;  Surgeon: BLeandrew Koyanagi MD;  [facility]  Service: Ophthalmology;  Laterality: Right;  Diabetic leave patient at 9 arrival  .  CATARACT EXTRACTION W/PHACO Left 10/27/2016   Procedure: CATARACT EXTRACTION PHACO AND INTRAOCULAR LENS PLACEMENT ([place] LEFT DIABETIC;  Surgeon: BLeandrew Koyanagi MD;  [facility]  Service: Ophthalmology;  Laterality: Left;  . COLONOSCOPY  03/2008   2 polyps, internal and external hemorrhoids, +anal growth. Dr. EVira Agar . KNEE SURGERY    . TONSILLECTOMY     as a child   Family History  Problem Relation Age of Onset  . Lung cancer Brother   . Diabetes Father   . Heart disease Sister        Valvular rheumatic heart disease  . Heart disease Paternal Grandmother   . Breast cancer Neg Hx   . Ovarian cancer Neg Hx   . Colon cancer Neg Hx    History  Sexual Activity  . Sexual activity: Not on file    Outpatient Encounter Prescriptions as of 11/16/2016  Medication Sig  . amLODipine (NORVASC) 5 MG tablet TAKE 1 TABLET (5 MG TOTAL) BY MOUTH DAILY.  .Marland Kitchenaspirin EC 81 MG tablet Take 81 mg by mouth daily.  . Blood Glucose Monitoring Suppl (ONE TOUCH ULTRA MINI) W/DEVICE KIT   . clopidogrel (PLAVIX) 75 MG tablet Take 75 mg by mouth daily.  .Marland Kitchenestradiol (ESTRACE) 0.1 MG/GM vaginal cream Place 1 Applicatorful vaginally once a week.  .Marland Kitchen  glucose blood (ONE TOUCH ULTRA TEST) test strip Use to check blood sugar daily. E 11.9 (Type 2 diabetes mellitis)  . levothyroxine (SYNTHROID, LEVOTHROID) 125 MCG tablet TAKE ONE TABLET BY MOUTH IN THE MORNING ON EMPTY STOMACH FOR THYROID  . lisinopril-hydrochlorothiazide (PRINZIDE,ZESTORETIC) 20-25 MG tablet TAKE ONE TABLET BY MOUTH ONCE DAILY  . metFORMIN (GLUCOPHAGE) 1000 MG tablet TAKE ONE TABLET BY MOUTH TWICE DAILY  . metoprolol (LOPRESSOR) 50 MG tablet Take 50 mg by mouth daily.  . Multiple Vitamins-Minerals (PRESERVISION AREDS 2 PO) Take 2 capsules by mouth daily.  . nitroGLYCERIN (NITROSTAT) 0.4 MG SL tablet Place 1 tablet (0.4 mg total) under the tongue every 5 (five) minutes as needed for chest pain.  [name] DELICA LANCETS FINE MISC    . OXYQUINOLONE SULFATE VAGINAL 0.025 % GEL Place 1 application vaginally once a week.  . simvastatin (ZOCOR) 40 MG tablet TAKE 1 TABLET BY MOUTH EVERY DAY FOR CHOLESTEROL  . traZODone (DESYREL) 100 MG tablet Take 1 tablet (100 mg total) by mouth at bedtime. (Patient not taking: Reported on 10/20/2016)   No facility-administered encounter medications on file as of 11/16/2016.     Activities of Daily Living In your present state of health, do you have any difficulty performing the following activities: 11/16/2016 10/27/2016  Hearing? N N  Vision? Y N  Comment just had cataract surgery -  Difficulty concentrating or making decisions? N N  Walking or climbing stairs? N N  Dressing or bathing? N N  Doing errands, shopping? N -  Preparing Food and eating ? N -  Using the Toilet? N -  In the past six months, have you accidently leaked urine? N -  Do you have problems with loss of bowel control? N -  Managing your Medications? N -  Managing your Finances? N -  Housekeeping or managing your Housekeeping? N -  Some recent data might be hidden    Patient Care Team: Birdie Sons, MD as PCP - General (Family Medicine) Defrancesco, Alanda Slim, MD as Consulting Physician (Obstetrics and Gynecology) Teodoro Spray, MD as Consulting Physician (Cardiology) Leandrew Koyanagi, MD as Referring Physician (Ophthalmology)    Assessment:     Exercise Activities and Dietary recommendations Current Exercise Habits: The patient does not participate in regular exercise at present, Exercise limited by: Other - see comments (recent eye issues)  Goals    . Increase water intake          Recommend increasing water intake to 6-8 glasses of water a day.       Fall Risk Fall Risk  11/16/2016 12/31/2015 11/11/2014  Falls in the past year? Yes Yes No  Number falls in past yr: 1 1 -  Injury with Fall? Yes - -  Comment broken knee cap - -  Follow up Falls prevention discussed Falls prevention discussed -    Depression Screen PHQ 2/9 Scores 11/16/2016 12/31/2015 11/11/2014  PHQ - 2 Score 2 0 0  PHQ- 9 Score 10 - -     Cognitive Function- Pt declined screening today.        Immunization History  Administered Date(s) Administered  . Influenza, High Dose Seasonal PF 01/29/2015, 12/31/2015  . Pneumococcal Conjugate-13 11/07/2013  . Pneumococcal Polysaccharide-23 12/07/2010  . Zoster 09/15/2015   Screening Tests Health Maintenance  Topic Date Due  . TETANUS/TDAP  12/06/1959  . INFLUENZA VACCINE  09/22/2016  . DEXA SCAN  11/07/2016  . OPHTHALMOLOGY EXAM  03/09/2017  . HEMOGLOBIN A1C  04/07/2017  . FOOT EXAM  04/28/2017  . COLONOSCOPY  09/29/2018  . PNA vac Low Risk Adult  Completed      Plan:  I have personally reviewed and addressed the Medicare Annual Wellness questionnaire and have noted the following in the patient's chart:  A. Medical and social history B. Use of alcohol, tobacco or illicit drugs  C. Current medications and supplements D. Functional ability and status E.  Nutritional status F.  Physical activity G. Advance directives H. List of other physicians I.  Hospitalizations, surgeries, and ER visits in previous 12 months J.  [place] such as hearing and vision if needed, cognitive and depression L. Referrals and appointments - none  In addition, I have reviewed and discussed with patient certain preventive protocols, quality metrics, and best practice recommendations. A written personalized care plan for preventive services as well as general preventive health recommendations were provided to patient.  See attached scanned questionnaire for additional information.   Signed,  Fabio Neighbors, LPN Nurse Health Advisor   MD Recommendations: Pt needs an influenza vaccine and tetanus vaccine. Declined both immunizations today. Pt plans on having her influenza vaccine at next OV and will check with insurance about coverage for tetanus. DEXA scan is  scheduled for tomorrow per pt.

## 2016-11-17 ENCOUNTER — Ambulatory Visit
Admission: RE | Admit: 2016-11-17 | Discharge: 2016-11-17 | Disposition: A | Discharge status: Home / Self Care | Payer: PPO | Source: Ambulatory Visit | Attending: Family Medicine | Admitting: Family Medicine

## 2016-11-17 DIAGNOSIS — M810 Age-related osteoporosis without current pathological fracture: Secondary | ICD-10-CM | POA: Diagnosis not present

## 2016-11-17 DIAGNOSIS — E2839 Other primary ovarian failure: Secondary | ICD-10-CM | POA: Diagnosis not present

## 2016-11-17 DIAGNOSIS — Z780 Asymptomatic menopausal state: Secondary | ICD-10-CM | POA: Diagnosis not present

## 2016-11-18 ENCOUNTER — Encounter: Payer: PPO | Admitting: Obstetrics and Gynecology

## 2016-11-19 ENCOUNTER — Telehealth: Payer: Self-pay

## 2016-11-19 MED ORDER — ALENDRONATE SODIUM 70 MG PO TABS: 70.0000 mg | ORAL_TABLET | ORAL | 4 refills | Status: DC

## 2016-11-19 NOTE — Telephone Encounter (Signed)
-----   Message from Birdie Sons, MD sent at 11/19/2016  9:10 AM EDT ----- Bone density shows worsening osteoporosis and she is at high risk for risk fracture. Standard treatment is alendronate 70mg  once a week, #12, rf x 4 which she has taken in the past. If she doesn't think she can tolerate alendronate, then can refer to endocrinology for twice a year Prolia injections.

## 2016-11-19 NOTE — Telephone Encounter (Signed)
Advised and spoke with patient who agreed to taking alendronate again. KW

## 2016-12-10 DIAGNOSIS — H353231 Exudative age-related macular degeneration, bilateral, with active choroidal neovascularization: Secondary | ICD-10-CM | POA: Diagnosis not present

## 2016-12-14 ENCOUNTER — Ambulatory Visit (INDEPENDENT_AMBULATORY_CARE_PROVIDER_SITE_OTHER): Payer: PPO | Admitting: Obstetrics and Gynecology

## 2016-12-14 ENCOUNTER — Encounter: Payer: Self-pay | Admitting: Obstetrics and Gynecology

## 2016-12-14 VITALS — BP 114/65 | HR 75 | Ht 63.0 in | Wt 147.0 lb

## 2016-12-14 DIAGNOSIS — N813 Complete uterovaginal prolapse: Secondary | ICD-10-CM | POA: Diagnosis not present

## 2016-12-14 DIAGNOSIS — Z4689 Encounter for fitting and adjustment of other specified devices: Secondary | ICD-10-CM

## 2016-12-14 NOTE — Patient Instructions (Signed)
1.  Return in 3 months for pessary maintenance

## 2016-12-14 NOTE — Progress Notes (Signed)
Chief complaint: 1.  Pessary maintenance 2.  Procidentia 3.  Cystocele  Patient presents for pessary check.  (Last visit 08/17/2016) Patient reports no significant difficulties with her pessary.  She is capable of removing and reinserting the pessary (Gelhorn). She reports no significant vaginal discharge, vaginal bleeding, or pelvic pain. Bowel and bladder function are normal. The patient is using Estrace cream intravaginally once a week.  She also is using Trimosan gel weekly.  Past medical history, past surgical history, problem list, medications, and allergies are reviewed  OBJECTIVE: BP 114/65   Pulse 75   Ht 5\' 3"  (1.6 m)   Wt 147 lb (66.7 kg)   BMI 26.04 kg/m  Pleasant elderly female in no acute distress.  Some signs of dementia are present.  Gel horn pessary is out at the present time. Abdomen: Soft, nontender Pelvic exam: External genitalia-atrophic changes present BUS-normal Vagina-mildly atrophic; cystocele present Cervix-parous; 1 cm cervical polyp at 5:00 on ectocervix; ecchymoses present; no cervical motion tenderness Uterus-normal size shape consistency and mobile Adnexa-nonpalpable nontender Rectovaginal-normal external exam  ASSESSMENT: 1.  Procidentia 2.  Traction cystocele 3.  Normal pessary maintenance  PLAN: 1.  Continue with Estrace cream weekly intravaginal 2.  Continue with Trimosan gel weekly intravaginal 3.  Return in 3 months for pessary maintenance  A total of 15 minutes were spent face-to-face with the patient during this encounter and over half of that time dealt with counseling and coordination of care.  Brayton Mars, MD  Note: This dictation was prepared with Dragon dictation along with smaller phrase technology. Any transcriptional errors that result from this process are unintentional.

## 2016-12-21 DIAGNOSIS — H16001 Unspecified corneal ulcer, right eye: Secondary | ICD-10-CM | POA: Diagnosis not present

## 2016-12-21 DIAGNOSIS — H353221 Exudative age-related macular degeneration, left eye, with active choroidal neovascularization: Secondary | ICD-10-CM | POA: Diagnosis not present

## 2016-12-21 DIAGNOSIS — H353211 Exudative age-related macular degeneration, right eye, with active choroidal neovascularization: Secondary | ICD-10-CM | POA: Diagnosis not present

## 2017-01-10 DIAGNOSIS — Z961 Presence of intraocular lens: Secondary | ICD-10-CM | POA: Diagnosis not present

## 2017-01-11 ENCOUNTER — Ambulatory Visit: Payer: PPO | Admitting: Family Medicine

## 2017-01-11 ENCOUNTER — Encounter: Payer: Self-pay | Admitting: Family Medicine

## 2017-01-11 VITALS — BP 120/70 | HR 75 | Temp 98.1°F | Resp 16 | Ht 63.0 in | Wt 149.0 lb

## 2017-01-11 DIAGNOSIS — I10 Essential (primary) hypertension: Secondary | ICD-10-CM

## 2017-01-11 DIAGNOSIS — I2510 Atherosclerotic heart disease of native coronary artery without angina pectoris: Secondary | ICD-10-CM

## 2017-01-11 DIAGNOSIS — S93409S Sprain of unspecified ligament of unspecified ankle, sequela: Secondary | ICD-10-CM | POA: Diagnosis not present

## 2017-01-11 DIAGNOSIS — Z23 Encounter for immunization: Secondary | ICD-10-CM | POA: Diagnosis not present

## 2017-01-11 DIAGNOSIS — E119 Type 2 diabetes mellitus without complications: Secondary | ICD-10-CM | POA: Diagnosis not present

## 2017-01-11 LAB — POCT GLYCOSYLATED HEMOGLOBIN (HGB A1C)
Est. average glucose Bld gHb Est-mCnc: 174
Hemoglobin A1C: 7.7

## 2017-01-11 MED ORDER — GLUCOSE BLOOD VI STRP: ORAL_STRIP | 3 refills | Status: DC

## 2017-01-11 NOTE — Patient Instructions (Addendum)
   You should wear an elastic ankle brace during the day until your left ankle heals up in a couple of weeks.

## 2017-01-11 NOTE — Progress Notes (Signed)
Patient: April Pittman Female    DOB: 06-12-1940   76 y.o.   MRN: [id number] Visit Date: 01/11/2017  Today's Provider: [name], MD   Chief Complaint  Patient presents with  . Follow-up  . Diabetes  . Hypertension   Subjective:    HPI   Diabetes Mellitus Type II, Follow-up:   Lab Results  Component Value Date   HGBA1C 7.5 10/05/2016   HGBA1C 8.0 04/28/2016   HGBA1C 8.0 12/31/2015   Last seen for diabetes 3 months ago.  Management since then includes; no changes. She reports good compliance with treatment. She is not having side effects. none Current symptoms include none and have been unchanged. Home blood sugar records: fasting range: ?  Episodes of hypoglycemia? no   Current Insulin Regimen: n/a Most Recent Eye Exam: yesterday Weight trend: stable Prior visit with dietician: no Current diet: in general, an "unhealthy" diet Current exercise: none  ----------------------------------------------------------------    Hypertension, follow-up:  BP Readings from Last 3 Encounters:  01/11/17 (!) 150/84  12/14/16 114/65  11/16/16 134/64    She was last seen for hypertension 3 months ago.  BP at that visit was 112/60. Management since that visit includes; no changes.She reports good compliance with treatment. She is not having side effects. none She is not exercising. She is not adherent to low salt diet.   Outside blood pressures are not checking. She is experiencing none.  Patient denies none.   Cardiovascular risk factors include diabetes mellitus.  Use of agents associated with hypertension: none.   ---------------------------------------------------------------- She also complains of pain in left lateral ankle much worse when walking over the last week. No known injury. No taking any OTC medications. Improves when off of feet.    No Known Allergies   Current Outpatient Medications:  .  alendronate (FOSAMAX) 70 MG tablet, Take 1  tablet (70 mg total) by mouth every 7 (seven) days. Take with a full glass of water on an empty stomach., Disp: 12 tablet, Rfl: 4 .  amLODipine (NORVASC) 5 MG tablet, TAKE 1 TABLET (5 MG TOTAL) BY MOUTH DAILY., Disp: 30 tablet, Rfl: 12 .  aspirin EC 81 MG tablet, Take 81 mg by mouth daily., Disp: , Rfl:  .  Blood Glucose Monitoring Suppl (ONE TOUCH ULTRA MINI) W/DEVICE KIT, , Disp: , Rfl:  .  clopidogrel (PLAVIX) 75 MG tablet, Take 75 mg by mouth daily., Disp: , Rfl:  .  estradiol (ESTRACE) 0.1 MG/GM vaginal cream, Place 1 Applicatorful vaginally once a week., Disp: , Rfl:  .  glucose blood (ONE TOUCH ULTRA TEST) test strip, Use to check blood sugar daily. E 11.9 (Type 2 diabetes mellitis), Disp: 100 each, Rfl: 3 .  levothyroxine (SYNTHROID, LEVOTHROID) 125 MCG tablet, TAKE ONE TABLET BY MOUTH IN THE MORNING ON EMPTY STOMACH FOR THYROID, Disp: 90 tablet, Rfl: 3 .  lisinopril-hydrochlorothiazide (PRINZIDE,ZESTORETIC) 20-25 MG tablet, TAKE ONE TABLET BY MOUTH ONCE DAILY, Disp: 90 tablet, Rfl: 3 .  metFORMIN (GLUCOPHAGE) 1000 MG tablet, TAKE ONE TABLET BY MOUTH TWICE DAILY, Disp: 180 tablet, Rfl: 3 .  metoprolol (LOPRESSOR) 50 MG tablet, Take 50 mg by mouth daily., Disp: , Rfl:  .  Multiple Vitamins-Minerals (PRESERVISION AREDS 2 PO), Take 2 capsules by mouth daily., Disp: , Rfl:  .  nitroGLYCERIN (NITROSTAT) 0.4 MG SL tablet, Place 1 tablet (0.4 mg total) under the tongue every 5 (five) minutes as needed for chest pain., Disp: 30 tablet, Rfl: 0 .  ONETOUCH DELICA LANCETS FINE MISC, , Disp: , Rfl:  .  OXYQUINOLONE SULFATE VAGINAL 0.025 % GEL, Place 1 application vaginally once a week., Disp: , Rfl:  .  simvastatin (ZOCOR) 40 MG tablet, TAKE 1 TABLET BY MOUTH EVERY DAY FOR CHOLESTEROL, Disp: 30 tablet, Rfl: 12 .  vitamin B-12 (CYANOCOBALAMIN) 1000 MCG tablet, Take by mouth., Disp: , Rfl:   Review of Systems  Constitutional: Negative for appetite change, chills, fatigue and fever.  Respiratory:  Negative for chest tightness and shortness of breath.   Cardiovascular: Negative for chest pain and palpitations.  Gastrointestinal: Negative for abdominal pain, nausea and vomiting.  Neurological: Negative for dizziness and weakness.    Social History   Tobacco Use  . Smoking status: Never Smoker  . Smokeless tobacco: Never Used  Substance Use Topics  . Alcohol use: No    Alcohol/week: 0.0 oz   Objective:   BP (!) 150/84 (BP Location: Right Arm, Patient Position: Sitting, Cuff Size: Normal)   Pulse 75   Temp 98.1 F (36.7 C) (Oral)   Resp 16   Ht 5' 3"  (1.6 m)   Wt 149 lb (67.6 kg)   SpO2 99%   BMI 26.39 kg/m  Vitals:   01/11/17 1055 01/11/17 1129  BP: (!) 150/84 120/70  Pulse: 75   Resp: 16   Temp: 98.1 F (36.7 C)   TempSrc: Oral   SpO2: 99%   Weight: 149 lb (67.6 kg)   Height: 5' 3"  (1.6 m)    Fall Risk  01/11/2017 11/16/2016 12/31/2015 11/11/2014  Falls in the past year? No Yes Yes No  Number falls in past yr: - 1 1 -  Injury with Fall? - Yes - -  Comment - broken knee cap - -  Follow up - Falls prevention discussed Falls prevention discussed -     Physical Exam   General Appearance:    Alert, cooperative, no distress  Eyes:    PERRL, conjunctiva/corneas clear, EOM's intact       Lungs:     Clear to auscultation bilaterally, respirations unlabored  Heart:    Regular rate and rhythm  Neurologic:   Awake, alert, oriented x 3. No apparent focal neurological           defect.   MS: Slight tenderness left lateral malleolus, no swelling, no redness, no gross deformities.      Results for orders placed or performed in visit on 01/11/17  POCT glycosylated hemoglobin (Hb A1C)  Result Value Ref Range   Hemoglobin A1C 7.7    Est. average glucose Bld gHb Est-mCnc 174        Assessment & Plan:      1. Type 2 diabetes mellitus without complication, without long-term current use of insulin ([place]) Fairly well controlled although a1c is increasing - POCT  glycosylated hemoglobin (Hb A1C)  2. Essential hypertension Well controlled.  Continue current medications.    3. Coronary artery disease involving native coronary artery of native heart without angina pectoris Asymptomatic. Compliant with medication.  Continue aggressive risk factor modification.    5. Sprain of ankle, unspecified laterality, unspecified ligament, sequela Patient Instructions   You should wear an elastic ankle brace during the day until your left ankle heals up in a couple of weeks.   Return in about 3 months (around 04/13/2017).        [name], MD  [facility]

## 2017-01-21 ENCOUNTER — Telehealth: Payer: Self-pay | Admitting: Family Medicine

## 2017-01-21 NOTE — Telephone Encounter (Signed)
Patient is having nosebleeds more frequently.  She would not come in to be seen this week but made an appointment for next week.  She was instructed on applying pressure when she has the nosebleed and to seek attention if she can not stop the bleeding after 30 minutes of continuous pressure or if she has any other difficulty.

## 2017-01-21 NOTE — Telephone Encounter (Signed)
Pt states she is have nose bleeds.  Pt states she spoke to Dr Caryn Section on 01/11/17 when she was in last.  Pt is concerned about these happening more often.  Please advise.  CB#[phone]/MW

## 2017-01-25 ENCOUNTER — Ambulatory Visit: Payer: Self-pay | Admitting: Family Medicine

## 2017-01-25 DIAGNOSIS — H353221 Exudative age-related macular degeneration, left eye, with active choroidal neovascularization: Secondary | ICD-10-CM | POA: Diagnosis not present

## 2017-02-04 ENCOUNTER — Ambulatory Visit: Payer: Self-pay | Admitting: Family Medicine

## 2017-02-08 DIAGNOSIS — H353211 Exudative age-related macular degeneration, right eye, with active choroidal neovascularization: Secondary | ICD-10-CM | POA: Diagnosis not present

## 2017-03-08 DIAGNOSIS — H353221 Exudative age-related macular degeneration, left eye, with active choroidal neovascularization: Secondary | ICD-10-CM | POA: Diagnosis not present

## 2017-03-29 DIAGNOSIS — H353211 Exudative age-related macular degeneration, right eye, with active choroidal neovascularization: Secondary | ICD-10-CM | POA: Diagnosis not present

## 2017-03-31 ENCOUNTER — Ambulatory Visit: Payer: PPO | Admitting: Obstetrics and Gynecology

## 2017-03-31 ENCOUNTER — Encounter: Payer: Self-pay | Admitting: Obstetrics and Gynecology

## 2017-03-31 VITALS — BP 109/62 | HR 73 | Ht 63.0 in | Wt 148.1 lb

## 2017-03-31 DIAGNOSIS — N813 Complete uterovaginal prolapse: Secondary | ICD-10-CM | POA: Diagnosis not present

## 2017-03-31 DIAGNOSIS — Z4689 Encounter for fitting and adjustment of other specified devices: Secondary | ICD-10-CM

## 2017-03-31 NOTE — Progress Notes (Signed)
Chief complaint: 1.  Pessary maintenance 2.  Procidentia 3.  Cystocele  Patient presents for pessary check.  (Last visit 12/14/2016) Patient reports no significant difficulties with her pessary.  She is capable of removing and reinserting the pessary (Gelhorn). She reports no significant vaginal discharge, vaginal bleeding, or pelvic pain. Bowel and bladder function are normal. The patient is using Estrace cream intravaginally once a week.  She also is using Trimosan gel weekly.  Past medical history, past surgical history, problem list, medications, and allergies are reviewed  OBJECTIVE: BP 109/62   Pulse 73   Ht 5\' 3"  (1.6 m)   Wt 148 lb 1.6 oz (67.2 kg)   BMI 26.23 kg/m  Pleasant elderly female in no acute distress.  Some signs of dementia are present.  Gel horn pessary is out at the present time. Abdomen: Soft, nontender Pelvic exam: External genitalia-atrophic changes present BUS-normal Vagina-mildly atrophic; cystocele present-first-degree Cervix-parous; no lesions; no cervical motion tenderness Uterus-normal size shape consistency and mobile Adnexa-nonpalpable nontender Rectovaginal-normal external exam  ASSESSMENT: 1.  Procidentia 2.  Traction cystocele 3.  Normal pessary maintenance  PLAN: 1.  Continue with Estrace cream weekly intravaginal 2.  Continue with Trimosan gel weekly intravaginal 3.  Return in 4 months for pessary maintenance  A total of 15 minutes were spent face-to-face with the patient during this encounter and over half of that time dealt with counseling and coordination of care.  Brayton Mars, MD

## 2017-03-31 NOTE — Patient Instructions (Addendum)
1.  Return in 4 months for pessary maintenance 2.  Continue with Trimosan gel intravaginal weekly 3.  Continue with Estrace cream intravaginal weekly

## 2017-04-11 DIAGNOSIS — H04123 Dry eye syndrome of bilateral lacrimal glands: Secondary | ICD-10-CM | POA: Diagnosis not present

## 2017-04-12 DIAGNOSIS — M7672 Peroneal tendinitis, left leg: Secondary | ICD-10-CM | POA: Diagnosis not present

## 2017-04-12 DIAGNOSIS — M25572 Pain in left ankle and joints of left foot: Secondary | ICD-10-CM | POA: Diagnosis not present

## 2017-05-09 ENCOUNTER — Telehealth: Payer: Self-pay | Admitting: Obstetrics and Gynecology

## 2017-05-09 DIAGNOSIS — H353221 Exudative age-related macular degeneration, left eye, with active choroidal neovascularization: Secondary | ICD-10-CM | POA: Diagnosis not present

## 2017-05-09 MED ORDER — OXYQUINOLONE SULFATE 0.025 % VA GEL: 1.0000 "application " | VAGINAL | 3 refills | Status: DC

## 2017-05-09 NOTE — Telephone Encounter (Signed)
Patient called stating she now needs a script to get Trima San gel from [facility] on [address]. Thanks

## 2017-05-09 NOTE — Telephone Encounter (Signed)
Pt aware trimosan erx.

## 2017-05-11 ENCOUNTER — Encounter: Payer: Self-pay | Admitting: Family Medicine

## 2017-05-11 ENCOUNTER — Ambulatory Visit (INDEPENDENT_AMBULATORY_CARE_PROVIDER_SITE_OTHER): Payer: PPO | Admitting: Family Medicine

## 2017-05-11 VITALS — BP 122/60 | HR 72 | Temp 97.9°F | Resp 16

## 2017-05-11 DIAGNOSIS — E7800 Pure hypercholesterolemia, unspecified: Secondary | ICD-10-CM

## 2017-05-11 DIAGNOSIS — M19072 Primary osteoarthritis, left ankle and foot: Secondary | ICD-10-CM

## 2017-05-11 DIAGNOSIS — H25013 Cortical age-related cataract, bilateral: Secondary | ICD-10-CM | POA: Diagnosis not present

## 2017-05-11 DIAGNOSIS — E538 Deficiency of other specified B group vitamins: Secondary | ICD-10-CM | POA: Diagnosis not present

## 2017-05-11 DIAGNOSIS — I10 Essential (primary) hypertension: Secondary | ICD-10-CM

## 2017-05-11 DIAGNOSIS — M810 Age-related osteoporosis without current pathological fracture: Secondary | ICD-10-CM

## 2017-05-11 DIAGNOSIS — E039 Hypothyroidism, unspecified: Secondary | ICD-10-CM

## 2017-05-11 DIAGNOSIS — E119 Type 2 diabetes mellitus without complications: Secondary | ICD-10-CM

## 2017-05-11 DIAGNOSIS — E559 Vitamin D deficiency, unspecified: Secondary | ICD-10-CM

## 2017-05-11 LAB — POCT GLYCOSYLATED HEMOGLOBIN (HGB A1C)
Est. average glucose Bld gHb Est-mCnc: 177
Hemoglobin A1C: 7.8

## 2017-05-11 MED ORDER — DICLOFENAC SODIUM 1 % TD GEL: 2.0000 g | Freq: Four times a day (QID) | TRANSDERMAL | 3 refills | Status: DC

## 2017-05-11 NOTE — Progress Notes (Signed)
Patient: April Pittman Female    DOB: 1940/06/20   77 y.o.   MRN: [id number] Visit Date: 05/11/2017  Today's Provider: [name], MD   Chief Complaint  Patient presents with  . Diabetes  . Hypertension  . Coronary Artery Disease  . Hypothyroidism   Subjective:    HPI   Diabetes Mellitus Type II, Follow-up:   Lab Results  Component Value Date   HGBA1C 7.8 05/11/2017   HGBA1C 7.7 01/11/2017   HGBA1C 7.5 10/05/2016   Last seen for diabetes 4 months ago.  Management since then includes; no changes. She reports good compliance with treatment. She is not having side effects.  Current symptoms include none and have been stable. Home blood sugar records: trend: stable  Episodes of hypoglycemia? no   Current Insulin Regimen: none Most Recent Eye Exam: about a few weeks ago.  Weight trend: stable Prior visit with dietician: no Current diet: well balanced Current exercise: no regular exercise    Hypertension, follow-up:  BP Readings from Last 3 Encounters:  05/11/17 122/60  03/31/17 109/62  01/11/17 120/70    She was last seen for hypertension 4 months ago.  BP at that visit was 150/84. Management since that visit includes; no changes.She reports good compliance with treatment. She is not having side effects.  She is not exercising. She is adherent to low salt diet.   Outside blood pressures are not being checked. She is experiencing none.  Patient denies exertional chest pressure/discomfort, lower extremity edema and palpitations.   Cardiovascular risk factors include diabetes mellitus and dyslipidemia.   Coronary artery disease involving native coronary artery of native heart without angina pectoris From 01/11/2017-no changes were made. She reports no chest pains or palpations. Continues follow up with Dr. Ubaldo Glassing, last seen by him August 2018.   Ankle pain Patient reports that she has had ongoing bilateral ankle pain for several weeks. She  reports that Dr. Elvina Mattes gave her a cortisone shot and it helped a little. She reports that over the last week, she has noticed swelling and pain in both ankles. She has taken Tylenol to help relieve the pain.   Follow up osteoporosis.  Was started alendronate in September which she states she has been taking consistently without adverse effects. .   No Known Allergies   Current Outpatient Medications:  .  alendronate (FOSAMAX) 70 MG tablet, Take 1 tablet (70 mg total) by mouth every 7 (seven) days. Take with a full glass of water on an empty stomach., Disp: 12 tablet, Rfl: 4 .  amLODipine (NORVASC) 5 MG tablet, TAKE 1 TABLET (5 MG TOTAL) BY MOUTH DAILY., Disp: 30 tablet, Rfl: 12 .  aspirin EC 81 MG tablet, Take 81 mg by mouth daily., Disp: , Rfl:  .  Blood Glucose Monitoring Suppl (ONE TOUCH ULTRA MINI) W/DEVICE KIT, , Disp: , Rfl:  .  clopidogrel (PLAVIX) 75 MG tablet, Take 75 mg by mouth daily., Disp: , Rfl:  .  estradiol (ESTRACE) 0.1 MG/GM vaginal cream, Place 1 Applicatorful vaginally once a week., Disp: , Rfl:  .  glucose blood (ONE TOUCH ULTRA TEST) test strip, Use to check blood sugar daily. E 11.9 (Type 2 diabetes mellitis), Disp: 100 each, Rfl: 3 .  levothyroxine (SYNTHROID, LEVOTHROID) 125 MCG tablet, TAKE ONE TABLET BY MOUTH IN THE MORNING ON EMPTY STOMACH FOR THYROID, Disp: 90 tablet, Rfl: 3 .  lisinopril-hydrochlorothiazide (PRINZIDE,ZESTORETIC) 20-25 MG tablet, TAKE ONE TABLET BY MOUTH ONCE DAILY,  Disp: 90 tablet, Rfl: 3 .  metFORMIN (GLUCOPHAGE) 1000 MG tablet, TAKE ONE TABLET BY MOUTH TWICE DAILY, Disp: 180 tablet, Rfl: 3 .  metoprolol (LOPRESSOR) 50 MG tablet, Take 50 mg by mouth daily., Disp: , Rfl:  .  Multiple Vitamins-Minerals (PRESERVISION AREDS 2 PO), Take 2 capsules by mouth daily., Disp: , Rfl:  .  nitroGLYCERIN (NITROSTAT) 0.4 MG SL tablet, Place 1 tablet (0.4 mg total) under the tongue every 5 (five) minutes as needed for chest pain., Disp: 30 tablet, Rfl: 0 .   ONETOUCH DELICA LANCETS FINE MISC, , Disp: , Rfl:  .  OXYQUINOLONE SULFATE VAGINAL 0.025 % GEL, Place 1 application vaginally once a week., Disp: 113.4 g, Rfl: 3 .  simvastatin (ZOCOR) 40 MG tablet, TAKE 1 TABLET BY MOUTH EVERY DAY FOR CHOLESTEROL, Disp: 30 tablet, Rfl: 12 .  vitamin B-12 (CYANOCOBALAMIN) 1000 MCG tablet, Take by mouth., Disp: , Rfl:   Review of Systems  Constitutional: Negative for appetite change, chills, fatigue and fever.  Respiratory: Negative for chest tightness and shortness of breath.   Cardiovascular: Negative for chest pain, palpitations and leg swelling.  Gastrointestinal: Negative for abdominal pain, nausea and vomiting.  Musculoskeletal: Positive for arthralgias.  Neurological: Negative for dizziness and weakness.    Social History   Tobacco Use  . Smoking status: Never Smoker  . Smokeless tobacco: Never Used  Substance Use Topics  . Alcohol use: No    Alcohol/week: 0.0 oz   Objective:   BP 122/60 (BP Location: Right Arm, Patient Position: Sitting, Cuff Size: Normal)   Pulse 72   Temp 97.9 F (36.6 C)   Resp 16   SpO2 97%  Vitals:   05/11/17 1057  BP: 122/60  Pulse: 72  Resp: 16  Temp: 97.9 F (36.6 C)  SpO2: 97%     Physical Exam  General Appearance:    Alert, cooperative, no distress  HENT:   ENT exam normal, no neck nodes or sinus tenderness  Eyes:    PERRL, conjunctiva/corneas clear, EOM's intact       Lungs:     Clear to auscultation bilaterally, respirations unlabored  Heart:    Regular rate and rhythm  Neurologic:   Awake, alert, oriented x 3. No apparent focal neurological           defect.       Diabetic Foot Exam - Simple   Simple Foot Form Diabetic Foot exam was performed with the following findings:  Yes 05/11/2017 11:32 AM  Visual Inspection Sensation Testing Pulse Check Comments Slightly swollen and tender around lateral and medial left ankle.     Results for orders placed or performed in visit on 05/11/17  POCT  glycosylated hemoglobin (Hb A1C)  Result Value Ref Range   Hemoglobin A1C 7.8    Est. average glucose Bld gHb Est-mCnc 177        Assessment & Plan:     1. Type 2 diabetes mellitus without complication, without long-term current use of insulin ([facility]) Slowly worsening. She is going to work on diet. Counseled likely need additional medications if not improved at follow up. Consider SGLT-2 inhibitor - POCT glycosylated hemoglobin (Hb A1C) - Renal function panel  2. Essential hypertension Well controlled.  Continue current medications.   - Renal function panel  3. Osteoporosis, unspecified osteoporosis type, unspecified pathological fracture presence Taking alendronate consistently. Repeat BMD in 2020-2021   4. Hypothyroidism, unspecified type  - TSH  5. Vitamin D deficiency  - VITAMIN  D 25 Hydroxy (Vit-D Deficiency, Fractures)  6. B12 deficiency  - Vitamin B12  7. Osteoarthritis of left ankle, unspecified osteoarthritis type Not a good candidate for oral NSAIDs and not much relief from steroid injections.  Try - diclofenac sodium (VOLTAREN) 1 % GEL; Apply 2-4 g topically 4 (four) times daily.  Dispense: 100 g; Refill: 3   8. Hypercholesteremia She is tolerating simvastatin well with no adverse effects.  - Direct LDL9.   CAD      Addressed extensive list of chronic and acute medical problems today requiring extensive time in counseling and coordination of care.  Over half of this 45 minute visit were spent in counseling and coordinating care of multiple medical problems.   [name], MD  [facility]

## 2017-05-12 LAB — RENAL FUNCTION PANEL
ALBUMIN: 4.2 g/dL (ref 3.5–4.8)
BUN/Creatinine Ratio: 18 (ref 12–28)
BUN: 11 mg/dL (ref 8–27)
CO2: 26 mmol/L (ref 20–29)
CREATININE: 0.6 mg/dL (ref 0.57–1.00)
Calcium: 9.2 mg/dL (ref 8.7–10.3)
Chloride: 98 mmol/L (ref 96–106)
GFR calc non Af Amer: 89 mL/min/{1.73_m2} (ref >59)
GFR, EST AFRICAN AMERICAN: 102 mL/min/{1.73_m2} (ref >59)
Glucose: 141 mg/dL — ABNORMAL HIGH (ref 65–99)
Phosphorus: 3 mg/dL (ref 2.5–4.5)
Potassium: 3.7 mmol/L (ref 3.5–5.2)
Sodium: 141 mmol/L (ref 134–144)

## 2017-05-12 LAB — VITAMIN B12

## 2017-05-12 LAB — TSH: TSH: 1.63 u[IU]/mL (ref 0.450–4.500)

## 2017-05-12 LAB — LDL CHOLESTEROL, DIRECT: LDL Direct: 73 mg/dL (ref 0–99)

## 2017-05-12 LAB — VITAMIN D 25 HYDROXY (VIT D DEFICIENCY, FRACTURES): VIT D 25 HYDROXY: 25.3 ng/mL — AB (ref 30.0–100.0)

## 2017-05-23 DIAGNOSIS — I10 Essential (primary) hypertension: Secondary | ICD-10-CM | POA: Diagnosis not present

## 2017-05-23 DIAGNOSIS — E7800 Pure hypercholesterolemia, unspecified: Secondary | ICD-10-CM | POA: Diagnosis not present

## 2017-05-23 DIAGNOSIS — I2510 Atherosclerotic heart disease of native coronary artery without angina pectoris: Secondary | ICD-10-CM | POA: Diagnosis not present

## 2017-05-24 DIAGNOSIS — H353211 Exudative age-related macular degeneration, right eye, with active choroidal neovascularization: Secondary | ICD-10-CM | POA: Diagnosis not present

## 2017-06-03 ENCOUNTER — Other Ambulatory Visit: Payer: Self-pay | Admitting: Family Medicine

## 2017-07-19 ENCOUNTER — Encounter: Payer: Self-pay | Admitting: Family Medicine

## 2017-07-19 DIAGNOSIS — H353221 Exudative age-related macular degeneration, left eye, with active choroidal neovascularization: Secondary | ICD-10-CM | POA: Diagnosis not present

## 2017-07-19 DIAGNOSIS — H353211 Exudative age-related macular degeneration, right eye, with active choroidal neovascularization: Secondary | ICD-10-CM | POA: Diagnosis not present

## 2017-07-19 LAB — HM DIABETES EYE EXAM

## 2017-07-26 DIAGNOSIS — H353211 Exudative age-related macular degeneration, right eye, with active choroidal neovascularization: Secondary | ICD-10-CM | POA: Diagnosis not present

## 2017-08-10 ENCOUNTER — Ambulatory Visit: Payer: PPO | Admitting: Obstetrics and Gynecology

## 2017-08-10 ENCOUNTER — Encounter: Payer: Self-pay | Admitting: Obstetrics and Gynecology

## 2017-08-10 VITALS — BP 117/68 | HR 76 | Ht 63.0 in | Wt 146.4 lb

## 2017-08-10 DIAGNOSIS — N813 Complete uterovaginal prolapse: Secondary | ICD-10-CM

## 2017-08-10 DIAGNOSIS — N814 Uterovaginal prolapse, unspecified: Secondary | ICD-10-CM

## 2017-08-10 DIAGNOSIS — Z4689 Encounter for fitting and adjustment of other specified devices: Secondary | ICD-10-CM

## 2017-08-10 NOTE — Progress Notes (Signed)
Chief complaint: 1.  Uterine procidentia 2.  Traction cystocele 3.  Pessary maintenance  Patient presents for pessary check (last visit 03/31/2017). Patient continues to do self-care with pessary.  She is removing and reinserting the gel horn pessary periodically.  She reports no significant vaginal discharge, vaginal bleeding, or pelvic pain.  Her bowel function is normal.  Bladder function is also normal. The patient is using Estrace cream intravaginal once a week along with Trimosan gel intravaginal once a week Currently the patient's pessary is out.  Past Medical History:  Diagnosis Date  . Alopecia   . Anginal pain ([place])   . Benign neoplasm of large bowel   . CAD (coronary artery disease)   . Carpal tunnel syndrome   . Complication of anesthesia    vomits with any pain medicine  . DM (diabetes mellitus) ([place])   . Erosion of cervix   . GERD (gastroesophageal reflux disease)   . H/O adenomatous polyp of colon    with sever dysplasia, removed 2000 by colonoscopy  . History of chicken pox   . Hypercholesteremia   . Hypertension   . Hypothyroid   . Irritable bowel   . Malignant melanoma of foot ([place])   . Osteoporosis   . PONV (postoperative nausea and vomiting)   . Post-menopausal bleeding   . Procidentia of uterus    with traction cystocele, managed with gellhorn pessary  . Rectal bleeding   . TIA (transient ischemic attack)    Past Surgical History:  Procedure Laterality Date  . BREAST EXCISIONAL BIOPSY Right    Benign  . CARDIAC CATHETERIZATION  10/2010   +stenting to LAD and RCA; symptoms: chest pain, elevated Troponin, +AMI. [employer]  . CATARACT EXTRACTION W/PHACO Right 07/28/2016   Procedure: CATARACT EXTRACTION PHACO AND INTRAOCULAR LENS PLACEMENT ([place])  Right Diabetic;  Surgeon: Leandrew Koyanagi, MD;  Location: [facility];  Service: Ophthalmology;  Laterality: Right;  Diabetic leave patient at 9 arrival  . CATARACT EXTRACTION W/PHACO Left 10/27/2016    Procedure: CATARACT EXTRACTION PHACO AND INTRAOCULAR LENS PLACEMENT ([place]) LEFT DIABETIC;  Surgeon: Leandrew Koyanagi, MD;  Location: [facility];  Service: Ophthalmology;  Laterality: Left;  . COLONOSCOPY  03/2008   2 polyps, internal and external hemorrhoids, +anal growth. Dr. Vira Agar  . KNEE SURGERY    . TONSILLECTOMY     as a child   Review of systems: Comprehensive review of systems is negative except for that noted in the HPI  OBJECTIVE: BP 117/68   Pulse 76   Ht 5\' 3"  (1.6 m)   Wt 146 lb 6.4 oz (66.4 kg)   BMI 25.93 kg/m  Patient is a pleasant elderly female in no acute distress.  Some signs of dementia are noted and appear clinically stable. BACK: No CVA tenderness ABDOMEN: Soft, nontender, without organomegaly PELVIC EXAM: External genitalia-atrophic changes present BUS-normal Vagina-mild atrophic changes are noted; first-degree cystocele is noted Cervix-parous; situated in the mid vagina; no lesions; no cervical motion tenderness on palpation Uterus-mid vagina location, normal size and shape, mobile, nontender Adnexa-nonpalpable and nontender Rectovaginal-normal external exam  PROCEDURE: The gel horn pessary is inserted today  ASSESSMENT: 1.  Procidentia, controlled with gel horn pessary 2.  Traction cystocele, controlled with gel horn pessary 3.  Pessary assessment today is normal. 4.  Mild dementia changes and associated anxieties  PLAN: 1.  Pessary is inserted today. 2.  Continue with Estrace cream intravaginal once a week 3.  Continue with Trimosan gel intravaginal once a  week 4.  Return in 4 months for pessary maintenance 5.  Anxiety concerns discussed with patient  A total of 15 minutes were spent face-to-face with the patient during this encounter and over half of that time dealt with counseling and coordination of care.  Brayton Mars, MD  Note: This dictation was prepared with Dragon dictation along with smaller phrase technology.  Any transcriptional errors that result from this process are unintentional.

## 2017-08-10 NOTE — Patient Instructions (Signed)
1.  Return in 4 months for pessary maintenance 2.  Continue using Trimosan gel intravaginal once a week 3.  Continue using Premarin cream intravaginal once a week

## 2017-08-11 NOTE — Progress Notes (Signed)
Patient: April Pittman Female    DOB: 1940-03-14   77 y.o.   MRN: [id number] Visit Date: 08/12/2017  Today's Provider: [name], MD   Chief Complaint  Patient presents with  . Follow-up  . Diabetes  . Hypertension  . Hypothyroidism   Subjective:    HPI   Diabetes Mellitus Type II, Follow-up:   Lab Results  Component Value Date   HGBA1C 7.2 (A) 08/12/2017   HGBA1C 7.8 05/11/2017   HGBA1C 7.7 01/11/2017   Last seen for diabetes 3 months ago.  Management since then includes; no changes. Patient advised to work on diet. Counseled likely to need additional medications at follow up if not improved. Will consider SGLT-2 inhibitor. She reports good compliance with treatment. She is not having side effects. none Current symptoms include none and have been unchanged. Home blood sugar records: fasting range: patient is checking  Episodes of hypoglycemia? no   Current Insulin Regimen: n/a Most Recent Eye Exam: complete Weight trend: stable Prior visit with dietician: no Current diet: well balanced Current exercise: none  ----------------------------------------------------------------   Hypertension, follow-up:  BP Readings from Last 3 Encounters:  08/12/17 124/64  08/10/17 117/68  05/11/17 122/60     She was last seen for hypertension 3 months ago.  BP at that visit was 122/60. Management since that visit includes; no changes.She reports good compliance with treatment. She is not having side effects. none She is not exercising. She is adherent to low salt diet.   Outside blood pressures are not checking. She is experiencing none.  Patient denies none.   Cardiovascular risk factors include diabetes mellitus.  Use of agents associated with hypertension: none ----------------------------------------------------------------  Hypothyroidism, unspecified type From 05/11/2017-labs checked, no changes.  Lab Results  Component Value Date   TSH 1.630  05/11/2017     Vitamin D deficiency From 05/11/2017-labs checked, no changes.  Lab Results  Component Value Date   VD25OH 25.3 (L) 05/11/2017    B12 deficiency From 05/11/2017-labs checked. Vitamin B12 levels were low. Patient advised to start back taking b12 supplements.  Osteoarthritis of left ankle, unspecified osteoarthritis type From 05/11/2017-given rx for diclofenac sodium (VOLTAREN) 1 % GEL.  Patient also wants to discuss her ongoing left ankle pain. Patient has been wearing a brace for some time, however she states the brace is rubbing on her skin. Also patient states she fell out of bed the other night and has had soreness in her rib area since. She has been seeing podiatry for her ankle.  No Known Allergies   Current Outpatient Medications:  .  alendronate (FOSAMAX) 70 MG tablet, Take 1 tablet (70 mg total) by mouth every 7 (seven) days. Take with a full glass of water on an empty stomach., Disp: 12 tablet, Rfl: 4 .  amLODipine (NORVASC) 5 MG tablet, TAKE 1 TABLET (5 MG TOTAL) BY MOUTH DAILY., Disp: 30 tablet, Rfl: 12 .  aspirin EC 81 MG tablet, Take 81 mg by mouth daily., Disp: , Rfl:  .  Blood Glucose Monitoring Suppl (ONE TOUCH ULTRA MINI) W/DEVICE KIT, , Disp: , Rfl:  .  clopidogrel (PLAVIX) 75 MG tablet, Take 75 mg by mouth daily., Disp: , Rfl:  .  diclofenac sodium (VOLTAREN) 1 % GEL, Apply 2-4 g topically 4 (four) times daily., Disp: 100 g, Rfl: 3 .  estradiol (ESTRACE) 0.1 MG/GM vaginal cream, Place 1 Applicatorful vaginally once a week., Disp: , Rfl:  .  glucose blood (ONE  TOUCH ULTRA TEST) test strip, Use to check blood sugar daily. E 11.9 (Type 2 diabetes mellitis), Disp: 100 each, Rfl: 3 .  levothyroxine (SYNTHROID, LEVOTHROID) 125 MCG tablet, TAKE ONE TABLET BY MOUTH IN THE MORNING ON EMPTY STOMACH FOR THYROID, Disp: 90 tablet, Rfl: 3 .  lisinopril-hydrochlorothiazide (PRINZIDE,ZESTORETIC) 20-25 MG tablet, TAKE ONE TABLET BY MOUTH ONCE DAILY, Disp: 90 tablet,  Rfl: 3 .  metFORMIN (GLUCOPHAGE) 1000 MG tablet, TAKE ONE TABLET BY MOUTH TWICE DAILY, Disp: 180 tablet, Rfl: 3 .  metoprolol (LOPRESSOR) 50 MG tablet, Take 50 mg by mouth daily., Disp: , Rfl:  .  Multiple Vitamins-Minerals (PRESERVISION AREDS 2 PO), Take 2 capsules by mouth daily., Disp: , Rfl:  .  nitroGLYCERIN (NITROSTAT) 0.4 MG SL tablet, Place 1 tablet (0.4 mg total) under the tongue every 5 (five) minutes as needed for chest pain., Disp: 30 tablet, Rfl: 0 .  ONETOUCH DELICA LANCETS FINE MISC, , Disp: , Rfl:  .  OXYQUINOLONE SULFATE VAGINAL 0.025 % GEL, Place 1 application vaginally once a week., Disp: 113.4 g, Rfl: 3 .  simvastatin (ZOCOR) 40 MG tablet, TAKE 1 TABLET BY MOUTH EVERY DAY FOR CHOLESTEROL, Disp: 30 tablet, Rfl: 12 .  vitamin B-12 (CYANOCOBALAMIN) 1000 MCG tablet, Take by mouth., Disp: , Rfl:   Review of Systems  Constitutional: Negative for appetite change, chills, fatigue and fever.  Respiratory: Negative for chest tightness and shortness of breath.   Cardiovascular: Negative for chest pain and palpitations.  Gastrointestinal: Negative for abdominal pain, nausea and vomiting.  Neurological: Negative for dizziness and weakness.    Social History   Tobacco Use  . Smoking status: Never Smoker  . Smokeless tobacco: Never Used  Substance Use Topics  . Alcohol use: No    Alcohol/week: 0.0 oz   Objective:   BP 124/64 (BP Location: Right Arm, Patient Position: Sitting, Cuff Size: Normal)   Pulse 74   Temp 97.6 F (36.4 C) (Oral)   Resp 16   Ht '5\' 3"'$  (1.6 m)   Wt 145 lb (65.8 kg)   SpO2 99%   BMI 25.69 kg/m  Vitals:   08/12/17 1108  BP: 124/64  Pulse: 74  Resp: 16  Temp: 97.6 F (36.4 C)  TempSrc: Oral  SpO2: 99%  Weight: 145 lb (65.8 kg)  Height: '5\' 3"'$  (1.6 m)     Physical Exam     General Appearance:    Alert, cooperative, no distress  Eyes:    PERRL, conjunctiva/corneas clear, EOM's intact       Lungs:     Clear to auscultation bilaterally,  respirations unlabored  Heart:    Regular rate and rhythm  Neurologic:   Awake, alert, oriented x 3. No apparent focal neurological           defect.        Results for orders placed or performed in visit on 08/12/17  POCT glycosylated hemoglobin (Hb A1C)  Result Value Ref Range   Hemoglobin A1C 7.2 (A) 4.0 - 5.6 %   HbA1c POC (<> result, manual entry)  4.0 - 5.6 %   HbA1c, POC (prediabetic range)  5.7 - 6.4 %   HbA1c, POC (controlled diabetic range)  0.0 - 7.0 %   Est. average glucose Bld gHb Est-mCnc 160        Assessment & Plan:     1. Type 2 diabetes mellitus without complication, without long-term current use of insulin (HCC) Well controlled.  Continue current medications.   -  POCT glycosylated hemoglobin (Hb A1C)  2. Essential hypertension Well controlled.  Continue current medications.    Return in about 3 months (around 11/12/2017).       [name], MD  [facility]

## 2017-08-12 ENCOUNTER — Ambulatory Visit (INDEPENDENT_AMBULATORY_CARE_PROVIDER_SITE_OTHER): Payer: PPO | Admitting: Family Medicine

## 2017-08-12 ENCOUNTER — Encounter: Payer: Self-pay | Admitting: Family Medicine

## 2017-08-12 VITALS — BP 124/64 | HR 74 | Temp 97.6°F | Resp 16 | Ht 63.0 in | Wt 145.0 lb

## 2017-08-12 DIAGNOSIS — I10 Essential (primary) hypertension: Secondary | ICD-10-CM

## 2017-08-12 DIAGNOSIS — E119 Type 2 diabetes mellitus without complications: Secondary | ICD-10-CM | POA: Diagnosis not present

## 2017-08-12 LAB — POCT GLYCOSYLATED HEMOGLOBIN (HGB A1C)
ESTIMATED AVERAGE GLUCOSE: 160
Hemoglobin A1C: 7.2 % — AB (ref 4.0–5.6)

## 2017-09-27 DIAGNOSIS — H353221 Exudative age-related macular degeneration, left eye, with active choroidal neovascularization: Secondary | ICD-10-CM | POA: Diagnosis not present

## 2017-10-04 DIAGNOSIS — H353211 Exudative age-related macular degeneration, right eye, with active choroidal neovascularization: Secondary | ICD-10-CM | POA: Diagnosis not present

## 2017-10-06 ENCOUNTER — Other Ambulatory Visit: Payer: Self-pay | Admitting: Family Medicine

## 2017-10-10 ENCOUNTER — Telehealth: Payer: Self-pay

## 2017-10-10 NOTE — Telephone Encounter (Signed)
Called pt to schedule AWV and she states that she needs to check her calendar first and would like a CB. Will CB tomorrow.  -MM

## 2017-10-11 NOTE — Telephone Encounter (Signed)
Scheduled for 11/18/17 @ 10 AM. -MM

## 2017-10-27 ENCOUNTER — Other Ambulatory Visit: Payer: Self-pay

## 2017-10-27 MED ORDER — LEVOTHYROXINE SODIUM 125 MCG PO TABS: ORAL_TABLET | ORAL | 3 refills | Status: DC

## 2017-10-27 MED ORDER — LISINOPRIL-HYDROCHLOROTHIAZIDE 20-25 MG PO TABS: 1.0000 | ORAL_TABLET | Freq: Every day | ORAL | 3 refills | Status: DC

## 2017-10-27 MED ORDER — METFORMIN HCL 1000 MG PO TABS: 1000.0000 mg | ORAL_TABLET | Freq: Two times a day (BID) | ORAL | 3 refills | Status: DC

## 2017-10-27 NOTE — Telephone Encounter (Signed)
Patient is requesting refills for the following medications: She is requesting for a 90 day supply send to [facility].  lisinopril-hydrochlorothiazide (PRINZIDE,ZESTORETIC) 20-25 MG   metFORMIN (GLUCOPHAGE) 1000   levothyroxine (SYNTHROID, LEVOTHROID) 125 MCG tablet

## 2017-10-31 ENCOUNTER — Other Ambulatory Visit: Payer: Self-pay | Admitting: Family Medicine

## 2017-11-04 DIAGNOSIS — E119 Type 2 diabetes mellitus without complications: Secondary | ICD-10-CM | POA: Diagnosis not present

## 2017-11-16 ENCOUNTER — Ambulatory Visit: Payer: Self-pay | Admitting: Family Medicine

## 2017-11-18 ENCOUNTER — Ambulatory Visit (INDEPENDENT_AMBULATORY_CARE_PROVIDER_SITE_OTHER): Payer: PPO

## 2017-11-18 VITALS — BP 124/54 | HR 63 | Temp 98.6°F | Ht 63.0 in | Wt 144.6 lb

## 2017-11-18 DIAGNOSIS — Z0000 Encounter for general adult medical examination without abnormal findings: Secondary | ICD-10-CM

## 2017-11-18 NOTE — Progress Notes (Signed)
Subjective:   April Pittman is a 77 y.o. female who presents for Medicare Annual (Subsequent) preventive examination.  Review of Systems:  N/A  Cardiac Risk Factors include: advanced age (>66mn, >>34women);dyslipidemia;diabetes mellitus;hypertension     Objective:     Vitals: BP (!) 124/54 (BP Location: Right Arm)   Pulse 63   Temp 98.6 F (37 C) (Oral)   Ht 5' 3"  (1.6 m)   Wt 144 lb 9.6 oz (65.6 kg)   BMI 25.61 kg/m   Body mass index is 25.61 kg/m.  Advanced Directives 11/18/2017 11/16/2016 10/27/2016 07/28/2016 12/06/2015  Does Patient Have a Medical Advance Directive? No No No No No  Would patient like information on creating a medical advance directive? No - Patient declined - No - Patient declined - -    Tobacco Social History   Tobacco Use  Smoking Status Never Smoker  Smokeless Tobacco Never Used     Counseling given: Not Answered   Clinical Intake:  Pre-visit preparation completed: Yes  Pain : No/denies pain Pain Score: 0-No pain     Nutritional Status: BMI 25 -29 Overweight Nutritional Risks: None Diabetes: Yes(type 2) CBG done?: No Did pt. bring in CBG monitor from home?: No  How often do you need to have someone help you when you read instructions, pamphlets, or other written materials from your doctor or pharmacy?: 4 - Often(Has Macular Degeneration. )  Interpreter Needed?: No  Information [facility] :: [facility]  Past Medical History:  Diagnosis Date  . Alopecia   . Anginal pain ([place]   . Benign neoplasm of large bowel   . CAD (coronary artery disease)   . Carpal tunnel syndrome   . Complication of anesthesia    vomits with any pain medicine  . DM (diabetes mellitus) ([place]   . Erosion of cervix   . GERD (gastroesophageal reflux disease)   . H/O adenomatous polyp of colon    with sever dysplasia, removed 2000 by colonoscopy  . History of chicken pox   . Hypercholesteremia   . Hypertension   . Hypothyroid   . Irritable bowel    . Malignant melanoma of foot ([place]   . Osteoporosis   . PONV (postoperative nausea and vomiting)   . Post-menopausal bleeding   . Procidentia of uterus    with traction cystocele, managed with gellhorn pessary  . Rectal bleeding   . TIA (transient ischemic attack)    Past Surgical History:  Procedure Laterality Date  . BREAST EXCISIONAL BIOPSY Right    Benign  . CARDIAC CATHETERIZATION  10/2010   +stenting to LAD and RCA; symptoms: chest pain, elevated Troponin, +AMI. [employer] . CATARACT EXTRACTION W/PHACO Right 07/28/2016   Procedure: CATARACT EXTRACTION PHACO AND INTRAOCULAR LENS PLACEMENT ([place]  Right Diabetic;  Surgeon: BLeandrew Koyanagi MD;  [place]  Service: Ophthalmology;  Laterality: Right;  Diabetic leave patient at 9 arrival  . CATARACT EXTRACTION W/PHACO Left 10/27/2016   Procedure: CATARACT EXTRACTION PHACO AND INTRAOCULAR LENS PLACEMENT ([place] LEFT DIABETIC;  Surgeon: BLeandrew Koyanagi MD;  [place]  Service: Ophthalmology;  Laterality: Left;  . COLONOSCOPY  03/2008   2 polyps, internal and external hemorrhoids, +anal growth. Dr. EVira Agar . KNEE SURGERY    . TONSILLECTOMY     as a child   Family History  Problem Relation Age of Onset  . Lung cancer Brother   . Diabetes Father   . Heart disease Sister  Valvular rheumatic heart disease  . Heart disease Paternal Grandmother   . Breast cancer Neg Hx   . Ovarian cancer Neg Hx   . Colon cancer Neg Hx    Social History   Socioeconomic History  . Marital status: Married    Spouse name: Not on file  . Number of children: 3  . Years of education: Not on file  . Highest education level: GED or equivalent  Occupational History  . Occupation: Retired    Comment: former Counselling psychologist  . Financial resource strain: Not hard at all  . Food insecurity:    Worry: Never true    Inability: Never true  . Transportation needs:    Medical: No     Non-medical: No  Tobacco Use  . Smoking status: Never Smoker  . Smokeless tobacco: Never Used  Substance and Sexual Activity  . Alcohol use: No    Alcohol/week: 0.0 standard drinks  . Drug use: No  . Sexual activity: Not Currently  Lifestyle  . Physical activity:    Days per week: Not on file    Minutes per session: Not on file  . Stress: To some extent  Relationships  . Social connections:    Talks on phone: Not on file    Gets together: Not on file    Attends religious service: Not on file    Active member of club or organization: Not on file    Attends meetings of clubs or organizations: Not on file    Relationship status: Not on file  Other Topics Concern  . Not on file  Social History Narrative  . Not on file    Outpatient Encounter Medications as of 11/18/2017  Medication Sig  . amLODipine (NORVASC) 5 MG tablet TAKE 1 TABLET (5 MG TOTAL) BY MOUTH DAILY.  Marland Kitchen aspirin EC 81 MG tablet Take 81 mg by mouth daily.  . Blood Glucose Monitoring Suppl (ONE TOUCH ULTRA MINI) W/DEVICE KIT   . calcium carbonate (OS-CAL) 1250 (500 Ca) MG chewable tablet Chew 1 tablet by mouth 2 (two) times daily.  . clopidogrel (PLAVIX) 75 MG tablet Take 75 mg by mouth daily.  Marland Kitchen glucose blood (ONE TOUCH ULTRA TEST) test strip Use to check blood sugar daily. E 11.9 (Type 2 diabetes mellitis)  . levothyroxine (SYNTHROID, LEVOTHROID) 125 MCG tablet TAKE 1 TABLET BY MOUTH IN THE MORNING ON AN EMPTY STOMACH FOR THYROID  . lisinopril-hydrochlorothiazide (PRINZIDE,ZESTORETIC) 20-25 MG tablet Take 1 tablet by mouth daily.  . metFORMIN (GLUCOPHAGE) 1000 MG tablet Take 1 tablet (1,000 mg total) by mouth 2 (two) times daily.  . metoprolol (LOPRESSOR) 50 MG tablet Take 50 mg by mouth 2 (two) times daily.   . Multiple Vitamins-Minerals (PRESERVISION AREDS 2 PO) Take 2 capsules by mouth daily.  . nitroGLYCERIN (NITROSTAT) 0.4 MG SL tablet Place 1 tablet (0.4 mg total) under the tongue every 5 (five) minutes as needed  for chest pain.  [name] DELICA LANCETS FINE MISC   . OXYQUINOLONE SULFATE VAGINAL 0.025 % GEL Place 1 application vaginally once a week. (Patient taking differently: Place 1 application vaginally once a week. )  . simvastatin (ZOCOR) 40 MG tablet TAKE 1 TABLET BY MOUTH EVERY DAY FOR CHOLESTEROL  . vitamin B-12 (CYANOCOBALAMIN) 1000 MCG tablet Take 1,000 mcg by mouth daily.   Marland Kitchen alendronate (FOSAMAX) 70 MG tablet Take 1 tablet (70 mg total) by mouth every 7 (seven) days. Take with a full glass of water on  an empty stomach. (Patient not taking: Reported on 11/18/2017)  . diclofenac sodium (VOLTAREN) 1 % GEL Apply 2-4 g topically 4 (four) times daily. (Patient not taking: Reported on 11/18/2017)  . estradiol (ESTRACE) 0.1 MG/GM vaginal cream Place 1 Applicatorful vaginally once a week.   No facility-administered encounter medications on file as of 11/18/2017.     Activities of Daily Living In your present state of health, do you have any difficulty performing the following activities: 11/18/2017  Hearing? N  Vision? Y  Comment Due to Macular Degeneration.   Difficulty concentrating or making decisions? N  Walking or climbing stairs? N  Dressing or bathing? N  Doing errands, shopping? Y  Comment Does not drive.   Preparing Food and eating ? N  Using the Toilet? N  In the past six months, have you accidently leaked urine? Y  Comment cCcasionally, with pressure.   Do you have problems with loss of bowel control? N  Managing your Medications? N  Managing your Finances? N  Housekeeping or managing your Housekeeping? N  Some recent data might be hidden    Patient Care Team: Birdie Sons, MD as PCP - General (Family Medicine) Defrancesco, Alanda Slim, MD as Consulting Physician (Obstetrics and Gynecology) Teodoro Spray, MD as Consulting Physician (Cardiology) Leandrew Koyanagi, MD as Referring Physician (Ophthalmology)    Assessment:   This is a routine wellness examination for  [place].  Exercise Activities and Dietary recommendations Current Exercise Habits: The patient does not participate in regular exercise at present, Exercise limited by: orthopedic condition(s)  Goals    . Increase water intake     Recommend increasing water intake to 6-8 glasses of water a day.        Fall Risk Fall Risk  11/18/2017 01/11/2017 11/16/2016 12/31/2015 11/11/2014  Falls in the past year? No No Yes Yes No  Number falls in past yr: - - 1 1 -  Injury with Fall? - - Yes - -  Comment - - broken knee cap - -  Follow up - - Falls prevention discussed Falls prevention discussed -   Is the patient's home free of loose throw rugs in walkways, pet beds, electrical cords, etc?   yes      Grab bars in the bathroom? no      Handrails on the stairs?   no      Adequate lighting?   yes  Timed Get Up and Go performed: N/A  Depression Screen PHQ 2/9 Scores 11/18/2017 11/16/2016 12/31/2015 11/11/2014  PHQ - 2 Score 2 2 0 0  PHQ- 9 Score 8 10 - -     Cognitive Function:      6CIT Screen 11/18/2017  What Year? 0 points  What month? 0 points  What time? 0 points  Count back from 20 0 points  Months in reverse 2 points  Repeat phrase 0 points  Total Score 2    Immunization History  Administered Date(s) Administered  . Influenza, High Dose Seasonal PF 01/29/2015, 12/31/2015, 01/11/2017  . Pneumococcal Conjugate-13 11/07/2013  . Pneumococcal Polysaccharide-23 12/07/2010  . Zoster 09/15/2015    Qualifies for Shingles Vaccine? Due for Shingles vaccine. Declined my offer to administer today. Education has been provided regarding the importance of this vaccine. Pt has been advised to call her insurance company to determine her out of pocket expense. Advised she may also receive this vaccine at her local pharmacy or Health Dept. Verbalized acceptance and understanding.  Screening Tests Health Maintenance  Topic Date Due  . TETANUS/TDAP  12/06/1959  . INFLUENZA VACCINE  09/22/2017  .  HEMOGLOBIN A1C  02/11/2018  . FOOT EXAM  05/12/2018  . OPHTHALMOLOGY EXAM  07/20/2018  . COLONOSCOPY  09/29/2018  . DEXA SCAN  11/18/2019  . PNA vac Low Risk Adult  Completed    Cancer Screenings: Lung: Low Dose CT Chest recommended if Age 62-80 years, 30 pack-year currently smoking OR have quit w/in 15years. Patient does not qualify. Breast:  Up to date on Mammogram? Yes   Up to date of Bone Density/Dexa? Yes Colorectal: Up to date  Additional Screenings:  Hepatitis C Screening: N/A     Plan:  I have personally reviewed and addressed the Medicare Annual Wellness questionnaire and have noted the following in the patient's chart:  A. Medical and social history B. Use of alcohol, tobacco or illicit drugs  C. Current medications and supplements D. Functional ability and status E.  Nutritional status F.  Physical activity G. Advance directives H. List of other physicians I.  Hospitalizations, surgeries, and ER visits in previous 12 months J.  [place] such as hearing and vision if needed, cognitive and depression L. Referrals and appointments - none  In addition, I have reviewed and discussed with patient certain preventive protocols, quality metrics, and best practice recommendations. A written personalized care plan for preventive services as well as general preventive health recommendations were provided to patient.  See attached scanned questionnaire for additional information.   Signed,  Fabio Neighbors, LPN Nurse Health Advisor   Nurse Recommendations: Pt declined the tetanus and influenza vaccines today. Pt has eye injections next week and would like to wait to receive the flu shot until after that apt. Pt to receive the flu shot at her next apt on 12/15/17.

## 2017-11-18 NOTE — Patient Instructions (Addendum)
[name] , Thank you for taking time to come for your Medicare Wellness Visit. I appreciate your ongoing commitment to your health goals. Please review the following plan we discussed and let me know if I can assist you in the future.   Screening recommendations/referrals: Colonoscopy: Up to date Mammogram: Up to date Bone Density: Up to date Recommended yearly ophthalmology/optometry visit for glaucoma screening and checkup Recommended yearly dental visit for hygiene and checkup  Vaccinations: Influenza vaccine: Pt declines today.  Pneumococcal vaccine: Up to date Tdap vaccine: Pt declines today.  Shingles vaccine: Pt declines today.     Advanced directives: Advance directive discussed with you today. Even though you declined this today please call our office should you change your mind and we can give you the proper paperwork for you to fill out.  Conditions/risks identified: Recommend increasing water intake to at least 4 glasses a day.   Next appointment: 12/15/17 with Dr Caryn Section.    Preventive Care 28 Years and Older, Female Preventive care refers to lifestyle choices and visits with your health care provider that can promote health and wellness. What does preventive care include?  A yearly physical exam. This is also called an annual well check.  Dental exams once or twice a year.  Routine eye exams. Ask your health care provider how often you should have your eyes checked.  Personal lifestyle choices, including:  Daily care of your teeth and gums.  Regular physical activity.  Eating a healthy diet.  Avoiding tobacco and drug use.  Limiting alcohol use.  Practicing safe sex.  Taking low-dose aspirin every day.  Taking vitamin and mineral supplements as recommended by your health care provider. What happens during an annual well check? The services and screenings done by your health care provider during your annual well check will depend on your age, overall  health, lifestyle risk factors, and family history of disease. Counseling  Your health care provider may ask you questions about your:  Alcohol use.  Tobacco use.  Drug use.  Emotional well-being.  Home and relationship well-being.  Sexual activity.  Eating habits.  History of falls.  Memory and ability to understand (cognition).  Work and work Statistician.  Reproductive health. Screening  You may have the following tests or measurements:  Height, weight, and BMI.  Blood pressure.  Lipid and cholesterol levels. These may be checked every 5 years, or more frequently if you are over 54 years old.  Skin check.  Lung cancer screening. You may have this screening every year starting at age 4 if you have a 30-pack-year history of smoking and currently smoke or have quit within the past 15 years.  Fecal occult blood test (FOBT) of the stool. You may have this test every year starting at age 10.  Flexible sigmoidoscopy or colonoscopy. You may have a sigmoidoscopy every 5 years or a colonoscopy every 10 years starting at age 33.  Hepatitis C blood test.  Hepatitis B blood test.  Sexually transmitted disease (STD) testing.  Diabetes screening. This is done by checking your blood sugar (glucose) after you have not eaten for a while (fasting). You may have this done every 1-3 years.  Bone density scan. This is done to screen for osteoporosis. You may have this done starting at age 22.  Mammogram. This may be done every 1-2 years. Talk to your health care provider about how often you should have regular mammograms. Talk with your health care provider about your test results,  treatment options, and if necessary, the need for more tests. Vaccines  Your health care provider may recommend certain vaccines, such as:  Influenza vaccine. This is recommended every year.  Tetanus, diphtheria, and acellular pertussis (Tdap, Td) vaccine. You may need a Td booster every 10  years.  Zoster vaccine. You may need this after age 20.  Pneumococcal 13-valent conjugate (PCV13) vaccine. One dose is recommended after age 16.  Pneumococcal polysaccharide (PPSV23) vaccine. One dose is recommended after age 7. Talk to your health care provider about which screenings and vaccines you need and how often you need them. This information is not intended to replace advice given to you by your health care provider. Make sure you discuss any questions you have with your health care provider. Document Released: 03/07/2015 Document Revised: 10/29/2015 Document Reviewed: 12/10/2014 Elsevier Interactive Patient Education  2017 [place] Prevention in the Home Falls can cause injuries. They can happen to people of all ages. There are many things you can do to make your home safe and to help prevent falls. What can I do on the outside of my home?  Regularly fix the edges of walkways and driveways and fix any cracks.  Remove anything that might make you trip as you walk through a door, such as a raised step or threshold.  Trim any bushes or trees on the path to your home.  Use bright outdoor lighting.  Clear any walking paths of anything that might make someone trip, such as rocks or tools.  Regularly check to see if handrails are loose or broken. Make sure that both sides of any steps have handrails.  Any raised decks and porches should have guardrails on the edges.  Have any leaves, snow, or ice cleared regularly.  Use sand or salt on walking paths during winter.  Clean up any spills in your garage right away. This includes oil or grease spills. What can I do in the bathroom?  Use night lights.  Install grab bars by the toilet and in the tub and shower. Do not use towel bars as grab bars.  Use non-skid mats or decals in the tub or shower.  If you need to sit down in the shower, use a plastic, non-slip stool.  Keep the floor dry. Clean up any water that  spills on the floor as soon as it happens.  Remove soap buildup in the tub or shower regularly.  Attach bath mats securely with double-sided non-slip rug tape.  Do not have throw rugs and other things on the floor that can make you trip. What can I do in the bedroom?  Use night lights.  Make sure that you have a light by your bed that is easy to reach.  Do not use any sheets or blankets that are too big for your bed. They should not hang down onto the floor.  Have a firm chair that has side arms. You can use this for support while you get dressed.  Do not have throw rugs and other things on the floor that can make you trip. What can I do in the kitchen?  Clean up any spills right away.  Avoid walking on wet floors.  Keep items that you use a lot in easy-to-reach places.  If you need to reach something above you, use a strong step stool that has a grab bar.  Keep electrical cords out of the way.  Do not use floor polish or wax that makes floors  slippery. If you must use wax, use non-skid floor wax.  Do not have throw rugs and other things on the floor that can make you trip. What can I do with my stairs?  Do not leave any items on the stairs.  Make sure that there are handrails on both sides of the stairs and use them. Fix handrails that are broken or loose. Make sure that handrails are as long as the stairways.  Check any carpeting to make sure that it is firmly attached to the stairs. Fix any carpet that is loose or worn.  Avoid having throw rugs at the top or bottom of the stairs. If you do have throw rugs, attach them to the floor with carpet tape.  Make sure that you have a light switch at the top of the stairs and the bottom of the stairs. If you do not have them, ask someone to add them for you. What else can I do to help prevent falls?  Wear shoes that:  Do not have high heels.  Have rubber bottoms.  Are comfortable and fit you well.  Are closed at the  toe. Do not wear sandals.  If you use a stepladder:  Make sure that it is fully opened. Do not climb a closed stepladder.  Make sure that both sides of the stepladder are locked into place.  Ask someone to hold it for you, if possible.  Clearly mark and make sure that you can see:  Any grab bars or handrails.  First and last steps.  Where the edge of each step is.  Use tools that help you move around (mobility aids) if they are needed. These include:  Canes.  Walkers.  Scooters.  Crutches.  Turn on the lights when you go into a dark area. Replace any light bulbs as soon as they burn out.  Set up your furniture so you have a clear path. Avoid moving your furniture around.  If any of your floors are uneven, fix them.  If there are any pets around you, be aware of where they are.  Review your medicines with your doctor. Some medicines can make you feel dizzy. This can increase your chance of falling. Ask your doctor what other things that you can do to help prevent falls. This information is not intended to replace advice given to you by your health care provider. Make sure you discuss any questions you have with your health care provider. Document Released: 12/05/2008 Document Revised: 07/17/2015 Document Reviewed: 03/15/2014 Elsevier Interactive Patient Education  2017 [employer].

## 2017-11-22 DIAGNOSIS — I2510 Atherosclerotic heart disease of native coronary artery without angina pectoris: Secondary | ICD-10-CM | POA: Diagnosis not present

## 2017-11-22 DIAGNOSIS — I10 Essential (primary) hypertension: Secondary | ICD-10-CM | POA: Diagnosis not present

## 2017-11-22 DIAGNOSIS — E7800 Pure hypercholesterolemia, unspecified: Secondary | ICD-10-CM | POA: Diagnosis not present

## 2017-11-22 DIAGNOSIS — E119 Type 2 diabetes mellitus without complications: Secondary | ICD-10-CM | POA: Diagnosis not present

## 2017-11-29 DIAGNOSIS — H353221 Exudative age-related macular degeneration, left eye, with active choroidal neovascularization: Secondary | ICD-10-CM | POA: Diagnosis not present

## 2017-12-12 DIAGNOSIS — H353211 Exudative age-related macular degeneration, right eye, with active choroidal neovascularization: Secondary | ICD-10-CM | POA: Diagnosis not present

## 2017-12-13 ENCOUNTER — Encounter: Payer: Self-pay | Admitting: Obstetrics and Gynecology

## 2017-12-13 ENCOUNTER — Ambulatory Visit: Payer: PPO | Admitting: Obstetrics and Gynecology

## 2017-12-13 VITALS — BP 127/57 | HR 73 | Ht 63.0 in | Wt 149.0 lb

## 2017-12-13 DIAGNOSIS — Z4689 Encounter for fitting and adjustment of other specified devices: Secondary | ICD-10-CM | POA: Diagnosis not present

## 2017-12-13 DIAGNOSIS — N813 Complete uterovaginal prolapse: Secondary | ICD-10-CM

## 2017-12-13 NOTE — Progress Notes (Signed)
Chief complaint: 1.  Uterine procidentia 2.  Traction cystocele 3.  Pessary maintenance  Patient presents for 4 month pessary check (last visit was 08/10/2017). Patient continues to do self-care with her pessary.  She is removing and reinserting the gel horn pessary periodically.  She has not had any significant vaginal discharge, vaginal bleeding, or pelvic pain.  Bowel function is normal.  Bladder function is also normal. The patient reports not using the Estrace cream as directed (once a week).  She is still intermittently using the Trimosan gel intravaginal once a week. Currently the pessary is out.  Past Medical History:  Diagnosis Date  . Alopecia   . Anginal pain ([place])   . Benign neoplasm of large bowel   . CAD (coronary artery disease)   . Carpal tunnel syndrome   . Complication of anesthesia    vomits with any pain medicine  . DM (diabetes mellitus) ([place])   . Erosion of cervix   . GERD (gastroesophageal reflux disease)   . H/O adenomatous polyp of colon    with sever dysplasia, removed 2000 by colonoscopy  . History of chicken pox   . Hypercholesteremia   . Hypertension   . Hypothyroid   . Irritable bowel   . Malignant melanoma of foot ([place])   . Osteoporosis   . PONV (postoperative nausea and vomiting)   . Post-menopausal bleeding   . Procidentia of uterus    with traction cystocele, managed with gellhorn pessary  . Rectal bleeding   . TIA (transient ischemic attack)    Past Surgical History:  Procedure Laterality Date  . BREAST EXCISIONAL BIOPSY Right    Benign  . CARDIAC CATHETERIZATION  10/2010   +stenting to LAD and RCA; symptoms: chest pain, elevated Troponin, +AMI. [employer]  . CATARACT EXTRACTION W/PHACO Right 07/28/2016   Procedure: CATARACT EXTRACTION PHACO AND INTRAOCULAR LENS PLACEMENT ([place])  Right Diabetic;  Surgeon: Leandrew Koyanagi, MD;  Location: [facility];  Service: Ophthalmology;  Laterality: Right;  Diabetic leave patient at 9  arrival  . CATARACT EXTRACTION W/PHACO Left 10/27/2016   Procedure: CATARACT EXTRACTION PHACO AND INTRAOCULAR LENS PLACEMENT ([place]) LEFT DIABETIC;  Surgeon: Leandrew Koyanagi, MD;  Location: [facility];  Service: Ophthalmology;  Laterality: Left;  . COLONOSCOPY  03/2008   2 polyps, internal and external hemorrhoids, +anal growth. Dr. Vira Agar  . KNEE SURGERY    . TONSILLECTOMY     as a child   Review of systems: Comprehensive review of systems is negative except for that noted in the HPI.  She also is no longer driving because of eye issues.  She does take public transportation.  OBJECTIVE: BP (!) 127/57   Pulse 73   Ht 5\' 3"  (1.6 m)   Wt 149 lb (67.6 kg)   BMI 26.39 kg/m  Pleasant elderly female no acute distress.  Dementia signs are present. Abdomen: Soft, nontender without organomegaly Bladder: Nontender Pelvic exam: *Genitalia-atrophic changes present BUS-normal Vagina-mild atrophic changes are present.  First-degree cystocele is present Cervix-parous; situated in the mid vagina; there is a 1 cm polyp at 5:00; no cervical motion tenderness on palpation Uterus-midplane, normal size and shape, mobile, nontender Adnexa-nonpalpable and nontender Rectovaginal-normal external exam Extremities warm and dry without edema  PROCEDURE: The gel horn pessary is inserted today following speculum exam/bimanual  ASSESSMENT: 1.  Procidentia, controlled with gel horn pessary; patient does self care 2.  Traction cystocele, controlled with gel horn pessary 3.  Normal pessary maintenance 4.  Mild dementia  changes persist and are stable  PLAN: 1.  Pessary is inserted today. 2.  Continue with Trimosan gel intravaginal weekly 3.  Return in 6 months for pessary maintenance-Dr. Amalia Hailey  A total of 15 minutes were spent face-to-face with the patient during this encounter and over half of that time dealt with counseling and coordination of care.  Brayton Mars, MD  Note: This  dictation was prepared with Dragon dictation along with smaller phrase technology. Any transcriptional errors that result from this process are unintentional.

## 2017-12-13 NOTE — Patient Instructions (Signed)
1.  Return in 6 months for pessary maintenance with Dr. Amalia Hailey 2.  Continue using Trimosan gel intravaginal weekly

## 2017-12-13 NOTE — Progress Notes (Signed)
Pt is present today for a pessary check. Pt stated that she is doing well no problems.

## 2017-12-15 ENCOUNTER — Encounter: Payer: Self-pay | Admitting: Family Medicine

## 2017-12-15 ENCOUNTER — Ambulatory Visit (INDEPENDENT_AMBULATORY_CARE_PROVIDER_SITE_OTHER): Payer: PPO | Admitting: Family Medicine

## 2017-12-15 VITALS — BP 128/60 | HR 71 | Temp 98.6°F | Resp 18 | Ht 63.0 in | Wt 146.0 lb

## 2017-12-15 DIAGNOSIS — Z23 Encounter for immunization: Secondary | ICD-10-CM

## 2017-12-15 DIAGNOSIS — E119 Type 2 diabetes mellitus without complications: Secondary | ICD-10-CM

## 2017-12-15 DIAGNOSIS — I10 Essential (primary) hypertension: Secondary | ICD-10-CM | POA: Diagnosis not present

## 2017-12-15 LAB — POCT GLYCOSYLATED HEMOGLOBIN (HGB A1C)
ESTIMATED AVERAGE GLUCOSE: 169
Hemoglobin A1C: 7.5 % — AB (ref 4.0–5.6)

## 2017-12-15 NOTE — Progress Notes (Signed)
Patient: April Pittman Female    DOB: 09-08-1940   77 y.o.   MRN: [id number] Visit Date: 12/15/2017  Today's Provider: [name], MD   Chief Complaint  Patient presents with  . Diabetes  . Hypertension  . Hypothyroidism  . Hyperlipidemia   Subjective:    HPI  Diabetes Mellitus Type II, Follow-up:   Lab Results  Component Value Date   HGBA1C 7.2 (A) 08/12/2017   HGBA1C 7.8 05/11/2017   HGBA1C 7.7 01/11/2017    Last seen for diabetes 4 months ago.  Management since then includes no changes. She reports good compliance with treatment. She is not having side effects.   Home blood sugar records: blood sugars are checked, but patient doesnt remember what it been averaging She is eating more candy lately.  Episodes of hypoglycemia? no   Current Insulin Regimen: none Most Recent Eye Exam: <1 year ago Weight trend: stable Prior visit with dietician: no Current diet: in general, an "unhealthy" diet Current exercise: none  Pertinent Labs:    Component Value Date/Time   CHOL 147 04/28/2016 1022   TRIG 196 (H) 04/28/2016 1022   HDL 47 04/28/2016 1022   LDLCALC 61 04/28/2016 1022   CREATININE 0.60 05/11/2017 1145    Wt Readings from Last 3 Encounters:  12/15/17 146 lb (66.2 kg)  12/13/17 149 lb (67.6 kg)  11/18/17 144 lb 9.6 oz (65.6 kg)    ------------------------------------------------------------------------  Hypertension, follow-up:  BP Readings from Last 3 Encounters:  12/15/17 128/60  12/13/17 (!) 127/57  11/18/17 (!) 124/54    She was last seen for hypertension 4 months ago.  BP at that visit was 124/64. Management since that visit includes no changes. She reports good compliance with treatment. She is not having side effects.  She is not exercising. She is not adherent to low salt diet.   Outside blood pressures are not being checked.  Patient denies chest pain, exertional chest pressure/discomfort, palpitations and syncope.     Cardiovascular risk factors include advanced age (older than 76 for men, 65 for women), diabetes mellitus, dyslipidemia and hypertension.  Use of agents associated with hypertension: NSAIDS.     Weight trend: stable Wt Readings from Last 3 Encounters:  12/15/17 146 lb (66.2 kg)  12/13/17 149 lb (67.6 kg)  11/18/17 144 lb 9.6 oz (65.6 kg)    Current diet: in general, an "unhealthy" diet  ------------------------------------------------------------------------ Follow up of Hypothyroid: Patient was last seen for this problem 7 months ago and no changes were made.    Lipid/Cholesterol, Follow-up:   Last seen for this7 months ago.  Management changes since that visit include none. . Last Lipid Panel:    Component Value Date/Time   CHOL 147 04/28/2016 1022   TRIG 196 (H) 04/28/2016 1022   HDL 47 04/28/2016 1022   CHOLHDL 3.1 04/28/2016 1022   LDLCALC 61 04/28/2016 1022   LDLDIRECT 73 05/11/2017 1145    Risk factors for vascular disease include diabetes mellitus, hypercholesterolemia and hypertension  She reports good compliance with treatment. She is not having side effects.  Current symptoms include none and have been stable. Weight trend: stable Prior visit with dietician: no Current diet: in general, an "unhealthy" diet Current exercise: none  Wt Readings from Last 3 Encounters:  12/15/17 146 lb (66.2 kg)  12/13/17 149 lb (67.6 kg)  11/18/17 144 lb 9.6 oz (65.6 kg)    -------------------------------------------------------------------     No Known Allergies  Current Outpatient Medications:  .  alendronate (FOSAMAX) 70 MG tablet, Take 1 tablet (70 mg total) by mouth every 7 (seven) days. Take with a full glass of water on an empty stomach., Disp: 12 tablet, Rfl: 4 .  amLODipine (NORVASC) 5 MG tablet, TAKE 1 TABLET (5 MG TOTAL) BY MOUTH DAILY., Disp: 30 tablet, Rfl: 12 .  aspirin EC 81 MG tablet, Take 81 mg by mouth daily., Disp: , Rfl:  .  Blood Glucose  Monitoring Suppl (ONE TOUCH ULTRA MINI) W/DEVICE KIT, , Disp: , Rfl:  .  calcium carbonate (OS-CAL) 1250 (500 Ca) MG chewable tablet, Chew 1 tablet by mouth 2 (two) times daily., Disp: , Rfl:  .  clopidogrel (PLAVIX) 75 MG tablet, Take 75 mg by mouth daily., Disp: , Rfl:  .  diclofenac sodium (VOLTAREN) 1 % GEL, Apply 2-4 g topically 4 (four) times daily., Disp: 100 g, Rfl: 3 .  estradiol (ESTRACE) 0.1 MG/GM vaginal cream, Place 1 Applicatorful vaginally once a week., Disp: , Rfl:  .  glucose blood (ONE TOUCH ULTRA TEST) test strip, Use to check blood sugar daily. E 11.9 (Type 2 diabetes mellitis), Disp: 100 each, Rfl: 3 .  levothyroxine (SYNTHROID, LEVOTHROID) 125 MCG tablet, TAKE 1 TABLET BY MOUTH IN THE MORNING ON AN EMPTY STOMACH FOR THYROID, Disp: 90 tablet, Rfl: 3 .  lisinopril-hydrochlorothiazide (PRINZIDE,ZESTORETIC) 20-25 MG tablet, Take 1 tablet by mouth daily., Disp: 90 tablet, Rfl: 3 .  metFORMIN (GLUCOPHAGE) 1000 MG tablet, Take 1 tablet (1,000 mg total) by mouth 2 (two) times daily., Disp: 180 tablet, Rfl: 3 .  metoprolol (LOPRESSOR) 50 MG tablet, Take 50 mg by mouth 2 (two) times daily. , Disp: , Rfl:  .  Multiple Vitamins-Minerals (PRESERVISION AREDS 2 PO), Take 2 capsules by mouth daily., Disp: , Rfl:  .  nitroGLYCERIN (NITROSTAT) 0.4 MG SL tablet, Place 1 tablet (0.4 mg total) under the tongue every 5 (five) minutes as needed for chest pain., Disp: 30 tablet, Rfl: 0 .  ONETOUCH DELICA LANCETS FINE MISC, , Disp: , Rfl:  .  OXYQUINOLONE SULFATE VAGINAL 0.025 % GEL, Place 1 application vaginally once a week., Disp: 113.4 g, Rfl: 3 .  simvastatin (ZOCOR) 40 MG tablet, TAKE 1 TABLET BY MOUTH EVERY DAY FOR CHOLESTEROL, Disp: 30 tablet, Rfl: 12 .  vitamin B-12 (CYANOCOBALAMIN) 1000 MCG tablet, Take 1,000 mcg by mouth daily. , Disp: , Rfl:   Review of Systems  Constitutional: Negative for appetite change, chills, fatigue and fever.  Eyes: Positive for visual disturbance.  Respiratory:  Positive for shortness of breath. Negative for chest tightness.   Cardiovascular: Negative for chest pain and palpitations.  Gastrointestinal: Negative for abdominal pain, nausea and vomiting.  Neurological: Negative for dizziness and weakness.  Psychiatric/Behavioral: Positive for sleep disturbance (trouble falling alseep).    Social History   Tobacco Use  . Smoking status: Never Smoker  . Smokeless tobacco: Never Used  Substance Use Topics  . Alcohol use: No    Alcohol/week: 0.0 standard drinks   Objective:   BP 128/60 (BP Location: Left Arm, Patient Position: Sitting, Cuff Size: Normal)   Pulse 71   Temp 98.6 F (37 C) (Oral)   Resp 18   Ht 5' 3"  (1.6 m)   Wt 146 lb (66.2 kg)   SpO2 98% Comment: room air  BMI 25.86 kg/m  Vitals:   12/15/17 1021  BP: 128/60  Pulse: 71  Resp: 18  Temp: 98.6 F (37 C)  TempSrc: Oral  SpO2: 98%  Weight: 146 lb (66.2 kg)  Height: 5' 3"  (1.6 m)  Body mass index is 25.86 kg/m.    Physical Exam   General Appearance:    Alert, cooperative, no distress  Eyes:    PERRL, conjunctiva/corneas clear, EOM's intact       Lungs:     Clear to auscultation bilaterally, respirations unlabored  Heart:    Regular rate and rhythm  Neurologic:   Awake, alert, oriented x 3. No apparent focal neurological           defect.       Results for orders placed or performed in visit on 12/15/17  POCT HgB A1C  Result Value Ref Range   Hemoglobin A1C 7.5 (A) 4.0 - 5.6 %   HbA1c POC (<> result, manual entry)     HbA1c, POC (prediabetic range)     HbA1c, POC (controlled diabetic range)     Est. average glucose Bld gHb Est-mCnc 169        Assessment & Plan:      1. Type 2 diabetes mellitus without complication, without long-term current use of insulin (HCC) Well controlled.  Continue current medications.  Work on cutting back on candy. Need to exercise more.  - POCT HgB A1C  2. Essential hypertension Well controlled.  Continue current medications.     3. Need for influenza vaccination  - Flu vaccine HIGH DOSE PF (Fluzone High dose)   Follow up 5 months      [name], MD  [facility]

## 2018-01-24 DIAGNOSIS — H353221 Exudative age-related macular degeneration, left eye, with active choroidal neovascularization: Secondary | ICD-10-CM | POA: Diagnosis not present

## 2018-01-27 ENCOUNTER — Other Ambulatory Visit: Payer: Self-pay | Admitting: Family Medicine

## 2018-02-09 ENCOUNTER — Telehealth: Payer: Self-pay | Admitting: Family Medicine

## 2018-02-09 NOTE — Telephone Encounter (Signed)
Pt is getting sick with head feeling stuffy and headache.  Husband has been sick and pt feels she is getting what he had. No transportation.   Please call pt back at [phone].  Thanks, [employer]

## 2018-02-10 NOTE — Telephone Encounter (Signed)
Spoke with April Pittman, she is complaining for runny nose and "stuffy head" that started a few days ago.  She denies fevers, chills, shortness of breath.  She states she is not coughing much but the "stuffiness" is keeping her up at night. She states her husband has been sick since thanksgiving and was recently prescribed an antibiotic.  She requested that Dr. Caryn Section send her in one as well.  I advised her that she would have to be seen for an office visit first.  I stated her symptoms could be viral and an antibiotic would not help.  She stated she is not able to get a ride today but will be able to tomorrow.  She will call in the morning for an appointment.   Thanks,   -Mickel Baas

## 2018-02-10 NOTE — Telephone Encounter (Signed)
LMTCB 01/31/2018  Thanks,   -Mickel Baas

## 2018-02-18 ENCOUNTER — Emergency Department: Payer: PPO

## 2018-02-18 ENCOUNTER — Other Ambulatory Visit: Payer: Self-pay

## 2018-02-18 ENCOUNTER — Encounter: Payer: Self-pay | Admitting: Emergency Medicine

## 2018-02-18 ENCOUNTER — Inpatient Hospital Stay
Admission: EM | Admit: 2018-02-18 | Discharge: 2018-02-22 | DRG: 871 | Disposition: A | Discharge status: Home Health | Payer: PPO | Attending: Specialist | Admitting: Specialist

## 2018-02-18 DIAGNOSIS — Z9841 Cataract extraction status, right eye: Secondary | ICD-10-CM | POA: Diagnosis not present

## 2018-02-18 DIAGNOSIS — J9811 Atelectasis: Secondary | ICD-10-CM | POA: Diagnosis not present

## 2018-02-18 DIAGNOSIS — R6520 Severe sepsis without septic shock: Secondary | ICD-10-CM

## 2018-02-18 DIAGNOSIS — Z8249 Family history of ischemic heart disease and other diseases of the circulatory system: Secondary | ICD-10-CM

## 2018-02-18 DIAGNOSIS — D509 Iron deficiency anemia, unspecified: Secondary | ICD-10-CM | POA: Diagnosis present

## 2018-02-18 DIAGNOSIS — Z9981 Dependence on supplemental oxygen: Secondary | ICD-10-CM

## 2018-02-18 DIAGNOSIS — Z9842 Cataract extraction status, left eye: Secondary | ICD-10-CM | POA: Diagnosis not present

## 2018-02-18 DIAGNOSIS — Z833 Family history of diabetes mellitus: Secondary | ICD-10-CM

## 2018-02-18 DIAGNOSIS — I10 Essential (primary) hypertension: Secondary | ICD-10-CM | POA: Diagnosis present

## 2018-02-18 DIAGNOSIS — Z7984 Long term (current) use of oral hypoglycemic drugs: Secondary | ICD-10-CM

## 2018-02-18 DIAGNOSIS — E7800 Pure hypercholesterolemia, unspecified: Secondary | ICD-10-CM | POA: Diagnosis present

## 2018-02-18 DIAGNOSIS — A419 Sepsis, unspecified organism: Secondary | ICD-10-CM

## 2018-02-18 DIAGNOSIS — E538 Deficiency of other specified B group vitamins: Secondary | ICD-10-CM | POA: Diagnosis present

## 2018-02-18 DIAGNOSIS — K219 Gastro-esophageal reflux disease without esophagitis: Secondary | ICD-10-CM | POA: Diagnosis not present

## 2018-02-18 DIAGNOSIS — N3000 Acute cystitis without hematuria: Secondary | ICD-10-CM | POA: Diagnosis not present

## 2018-02-18 DIAGNOSIS — E785 Hyperlipidemia, unspecified: Secondary | ICD-10-CM | POA: Diagnosis present

## 2018-02-18 DIAGNOSIS — E119 Type 2 diabetes mellitus without complications: Secondary | ICD-10-CM

## 2018-02-18 DIAGNOSIS — N179 Acute kidney failure, unspecified: Secondary | ICD-10-CM | POA: Diagnosis not present

## 2018-02-18 DIAGNOSIS — K580 Irritable bowel syndrome with diarrhea: Secondary | ICD-10-CM | POA: Diagnosis present

## 2018-02-18 DIAGNOSIS — R0602 Shortness of breath: Secondary | ICD-10-CM

## 2018-02-18 DIAGNOSIS — J811 Chronic pulmonary edema: Secondary | ICD-10-CM | POA: Diagnosis not present

## 2018-02-18 DIAGNOSIS — R140 Abdominal distension (gaseous): Secondary | ICD-10-CM

## 2018-02-18 DIAGNOSIS — Z85820 Personal history of malignant melanoma of skin: Secondary | ICD-10-CM | POA: Diagnosis not present

## 2018-02-18 DIAGNOSIS — R0902 Hypoxemia: Secondary | ICD-10-CM

## 2018-02-18 DIAGNOSIS — Z79899 Other long term (current) drug therapy: Secondary | ICD-10-CM

## 2018-02-18 DIAGNOSIS — Z86010 Personal history of colonic polyps: Secondary | ICD-10-CM | POA: Diagnosis not present

## 2018-02-18 DIAGNOSIS — I2510 Atherosclerotic heart disease of native coronary artery without angina pectoris: Secondary | ICD-10-CM | POA: Diagnosis present

## 2018-02-18 DIAGNOSIS — D649 Anemia, unspecified: Secondary | ICD-10-CM | POA: Diagnosis not present

## 2018-02-18 DIAGNOSIS — Z7983 Long term (current) use of bisphosphonates: Secondary | ICD-10-CM

## 2018-02-18 DIAGNOSIS — E039 Hypothyroidism, unspecified: Secondary | ICD-10-CM | POA: Diagnosis not present

## 2018-02-18 DIAGNOSIS — Z961 Presence of intraocular lens: Secondary | ICD-10-CM | POA: Diagnosis not present

## 2018-02-18 DIAGNOSIS — Z79890 Hormone replacement therapy (postmenopausal): Secondary | ICD-10-CM

## 2018-02-18 DIAGNOSIS — E8770 Fluid overload, unspecified: Secondary | ICD-10-CM | POA: Diagnosis not present

## 2018-02-18 DIAGNOSIS — K449 Diaphragmatic hernia without obstruction or gangrene: Secondary | ICD-10-CM | POA: Diagnosis present

## 2018-02-18 DIAGNOSIS — A4151 Sepsis due to Escherichia coli [E. coli]: Principal | ICD-10-CM | POA: Diagnosis present

## 2018-02-18 DIAGNOSIS — M810 Age-related osteoporosis without current pathological fracture: Secondary | ICD-10-CM | POA: Diagnosis present

## 2018-02-18 DIAGNOSIS — K8020 Calculus of gallbladder without cholecystitis without obstruction: Secondary | ICD-10-CM | POA: Diagnosis not present

## 2018-02-18 DIAGNOSIS — Z801 Family history of malignant neoplasm of trachea, bronchus and lung: Secondary | ICD-10-CM | POA: Diagnosis not present

## 2018-02-18 DIAGNOSIS — J9601 Acute respiratory failure with hypoxia: Secondary | ICD-10-CM | POA: Diagnosis not present

## 2018-02-18 DIAGNOSIS — Z8673 Personal history of transient ischemic attack (TIA), and cerebral infarction without residual deficits: Secondary | ICD-10-CM | POA: Diagnosis not present

## 2018-02-18 DIAGNOSIS — I4510 Unspecified right bundle-branch block: Secondary | ICD-10-CM | POA: Diagnosis present

## 2018-02-18 DIAGNOSIS — Z7982 Long term (current) use of aspirin: Secondary | ICD-10-CM

## 2018-02-18 DIAGNOSIS — E876 Hypokalemia: Secondary | ICD-10-CM | POA: Diagnosis present

## 2018-02-18 DIAGNOSIS — N12 Tubulo-interstitial nephritis, not specified as acute or chronic: Secondary | ICD-10-CM | POA: Diagnosis not present

## 2018-02-18 DIAGNOSIS — E872 Acidosis: Secondary | ICD-10-CM | POA: Diagnosis present

## 2018-02-18 DIAGNOSIS — R05 Cough: Secondary | ICD-10-CM | POA: Diagnosis not present

## 2018-02-18 DIAGNOSIS — Z7902 Long term (current) use of antithrombotics/antiplatelets: Secondary | ICD-10-CM

## 2018-02-18 DIAGNOSIS — Z955 Presence of coronary angioplasty implant and graft: Secondary | ICD-10-CM

## 2018-02-18 HISTORY — DX: Sepsis, unspecified organism: A41.9

## 2018-02-18 LAB — BLOOD CULTURE ID PANEL (REFLEXED)
ACINETOBACTER BAUMANNII: NOT DETECTED
Candida albicans: NOT DETECTED
Candida glabrata: NOT DETECTED
Candida krusei: NOT DETECTED
Candida parapsilosis: NOT DETECTED
Candida tropicalis: NOT DETECTED
Carbapenem resistance: NOT DETECTED
ESCHERICHIA COLI: DETECTED — AB
Enterobacter cloacae complex: NOT DETECTED
Enterobacteriaceae species: DETECTED — AB
Enterococcus species: NOT DETECTED
Haemophilus influenzae: NOT DETECTED
KLEBSIELLA PNEUMONIAE: NOT DETECTED
Klebsiella oxytoca: NOT DETECTED
Listeria monocytogenes: NOT DETECTED
Neisseria meningitidis: NOT DETECTED
PSEUDOMONAS AERUGINOSA: NOT DETECTED
Proteus species: NOT DETECTED
STREPTOCOCCUS PYOGENES: NOT DETECTED
Serratia marcescens: NOT DETECTED
Staphylococcus aureus (BCID): NOT DETECTED
Staphylococcus species: NOT DETECTED
Streptococcus agalactiae: NOT DETECTED
Streptococcus pneumoniae: NOT DETECTED
Streptococcus species: NOT DETECTED

## 2018-02-18 LAB — HEMOGLOBIN A1C
Hgb A1c MFr Bld: 7.6 % — ABNORMAL HIGH (ref 4.8–5.6)
Mean Plasma Glucose: 171.42 mg/dL

## 2018-02-18 LAB — COMPREHENSIVE METABOLIC PANEL
ALT: 11 U/L (ref 0–44)
AST: 28 U/L (ref 15–41)
Albumin: 3.2 g/dL — ABNORMAL LOW (ref 3.5–5.0)
Alkaline Phosphatase: 99 U/L (ref 38–126)
Anion gap: 14 (ref 5–15)
BUN: 24 mg/dL — ABNORMAL HIGH (ref 8–23)
CO2: 23 mmol/L (ref 22–32)
Calcium: 8.2 mg/dL — ABNORMAL LOW (ref 8.9–10.3)
Chloride: 95 mmol/L — ABNORMAL LOW (ref 98–111)
Creatinine, Ser: 1.42 mg/dL — ABNORMAL HIGH (ref 0.44–1.00)
GFR calc Af Amer: 41 mL/min — ABNORMAL LOW (ref >60)
GFR calc non Af Amer: 36 mL/min — ABNORMAL LOW (ref >60)
Glucose, Bld: 255 mg/dL — ABNORMAL HIGH (ref 70–99)
Potassium: 2.9 mmol/L — ABNORMAL LOW (ref 3.5–5.1)
Sodium: 132 mmol/L — ABNORMAL LOW (ref 135–145)
Total Bilirubin: 0.9 mg/dL (ref 0.3–1.2)
Total Protein: 7.1 g/dL (ref 6.5–8.1)

## 2018-02-18 LAB — CBC WITH DIFFERENTIAL/PLATELET
Abs Immature Granulocytes: 0.49 10*3/uL — ABNORMAL HIGH (ref 0.00–0.07)
Basophils Absolute: 0.1 10*3/uL (ref 0.0–0.1)
Basophils Relative: 0 %
Eosinophils Absolute: 0.2 10*3/uL (ref 0.0–0.5)
Eosinophils Relative: 1 %
HCT: 26.5 % — ABNORMAL LOW (ref 36.0–46.0)
Hemoglobin: 7.9 g/dL — ABNORMAL LOW (ref 12.0–15.0)
Immature Granulocytes: 2 %
Lymphocytes Relative: 2 %
Lymphs Abs: 0.4 10*3/uL — ABNORMAL LOW (ref 0.7–4.0)
MCH: 20.5 pg — ABNORMAL LOW (ref 26.0–34.0)
MCHC: 29.8 g/dL — ABNORMAL LOW (ref 30.0–36.0)
MCV: 68.7 fL — ABNORMAL LOW (ref 80.0–100.0)
Monocytes Absolute: 1.4 10*3/uL — ABNORMAL HIGH (ref 0.1–1.0)
Monocytes Relative: 6 %
NRBC: 0 % (ref 0.0–0.2)
Neutro Abs: 21.8 10*3/uL — ABNORMAL HIGH (ref 1.7–7.7)
Neutrophils Relative %: 89 %
Platelets: 424 10*3/uL — ABNORMAL HIGH (ref 150–400)
RBC: 3.86 MIL/uL — ABNORMAL LOW (ref 3.87–5.11)
RDW: 17.1 % — AB (ref 11.5–15.5)
SMEAR REVIEW: NORMAL
WBC: 24.4 10*3/uL — AB (ref 4.0–10.5)

## 2018-02-18 LAB — URINALYSIS, COMPLETE (UACMP) WITH MICROSCOPIC
Bilirubin Urine: NEGATIVE
Glucose, UA: 150 mg/dL — AB
Ketones, ur: NEGATIVE mg/dL
Nitrite: NEGATIVE
Protein, ur: 100 mg/dL — AB
RBC / HPF: >50 RBC/hpf — ABNORMAL HIGH (ref 0–5)
Specific Gravity, Urine: 1.015 (ref 1.005–1.030)
WBC, UA: >50 WBC/hpf — ABNORMAL HIGH (ref 0–5)
pH: 5 (ref 5.0–8.0)

## 2018-02-18 LAB — IRON AND TIBC
Iron: 8 ug/dL — ABNORMAL LOW (ref 28–170)
Saturation Ratios: 2 % — ABNORMAL LOW (ref 10.4–31.8)
TIBC: 364 ug/dL (ref 250–450)
UIBC: 356 ug/dL

## 2018-02-18 LAB — PROCALCITONIN: Procalcitonin: 92.15 ng/mL

## 2018-02-18 LAB — LACTIC ACID, PLASMA
LACTIC ACID, VENOUS: 3.3 mmol/L — AB (ref 0.5–1.9)
Lactic Acid, Venous: 4.7 mmol/L (ref 0.5–1.9)

## 2018-02-18 LAB — LACTATE DEHYDROGENASE: LDH: 152 U/L (ref 98–192)

## 2018-02-18 LAB — FERRITIN: Ferritin: 44 ng/mL (ref 11–307)

## 2018-02-18 LAB — CG4 I-STAT (LACTIC ACID)
Lactic Acid, Venous: 3.85 mmol/L (ref 0.5–1.9)
Lactic Acid, Venous: 1.5 mmol/L (ref 0.5–1.9)

## 2018-02-18 LAB — PROTIME-INR
INR: 1.15
Prothrombin Time: 14.6 seconds (ref 11.4–15.2)

## 2018-02-18 LAB — MAGNESIUM: Magnesium: 1.1 mg/dL — ABNORMAL LOW (ref 1.7–2.4)

## 2018-02-18 LAB — LIPASE, BLOOD: Lipase: 22 U/L (ref 11–51)

## 2018-02-18 LAB — INFLUENZA PANEL BY PCR (TYPE A & B)
Influenza A By PCR: NEGATIVE
Influenza B By PCR: NEGATIVE

## 2018-02-18 LAB — GLUCOSE, CAPILLARY
Glucose-Capillary: 229 mg/dL — ABNORMAL HIGH (ref 70–99)
Glucose-Capillary: 190 mg/dL — ABNORMAL HIGH (ref 70–99)

## 2018-02-18 MED ORDER — ACETAMINOPHEN 650 MG RE SUPP
650.0000 mg | Freq: Four times a day (QID) | RECTAL | Status: DC | PRN
  Filled 2018-02-18: qty 1

## 2018-02-18 MED ORDER — SODIUM CHLORIDE 0.9 % IV SOLN
2.0000 g | Freq: Once | INTRAVENOUS | Status: AC
  Administered 2018-02-18: 2 g via INTRAVENOUS
  Filled 2018-02-18: qty 2

## 2018-02-18 MED ORDER — ONDANSETRON HCL 4 MG/2ML IJ SOLN
4.0000 mg | Freq: Four times a day (QID) | INTRAMUSCULAR | Status: DC | PRN
  Administered 2018-02-19 – 2018-02-20 (×3): 4 mg via INTRAVENOUS
  Filled 2018-02-18 (×3): qty 2

## 2018-02-18 MED ORDER — INSULIN ASPART 100 UNIT/ML ~~LOC~~ SOLN
0.0000 [IU] | Freq: Three times a day (TID) | SUBCUTANEOUS | Status: DC
  Administered 2018-02-18 – 2018-02-19 (×2): 3 [IU] via SUBCUTANEOUS
  Administered 2018-02-19 – 2018-02-20 (×3): 2 [IU] via SUBCUTANEOUS
  Administered 2018-02-20 – 2018-02-21 (×4): 1 [IU] via SUBCUTANEOUS
  Administered 2018-02-21: 12:00:00 2 [IU] via SUBCUTANEOUS
  Administered 2018-02-22: 1 [IU] via SUBCUTANEOUS
  Filled 2018-02-18 (×11): qty 1

## 2018-02-18 MED ORDER — METRONIDAZOLE IN NACL 5-0.79 MG/ML-% IV SOLN
500.0000 mg | Freq: Three times a day (TID) | INTRAVENOUS | Status: DC
  Administered 2018-02-18: 500 mg via INTRAVENOUS
  Filled 2018-02-18 (×3): qty 100

## 2018-02-18 MED ORDER — ONDANSETRON HCL 4 MG/2ML IJ SOLN
4.0000 mg | Freq: Once | INTRAMUSCULAR | Status: DC
  Filled 2018-02-18: qty 2

## 2018-02-18 MED ORDER — ENOXAPARIN SODIUM 30 MG/0.3ML ~~LOC~~ SOLN
30.0000 mg | SUBCUTANEOUS | Status: DC
  Administered 2018-02-18: 30 mg via SUBCUTANEOUS
  Filled 2018-02-18: qty 0.3

## 2018-02-18 MED ORDER — SODIUM CHLORIDE 0.9 % IV BOLUS (SEPSIS)
1000.0000 mL | Freq: Once | INTRAVENOUS | Status: AC
  Administered 2018-02-18: 1000 mL via INTRAVENOUS

## 2018-02-18 MED ORDER — VITAMIN B-12 1000 MCG PO TABS
1000.0000 ug | ORAL_TABLET | Freq: Every day | ORAL | Status: DC
  Administered 2018-02-18 – 2018-02-22 (×5): 1000 ug via ORAL
  Filled 2018-02-18 (×5): qty 1

## 2018-02-18 MED ORDER — PANTOPRAZOLE SODIUM 40 MG PO TBEC
40.0000 mg | DELAYED_RELEASE_TABLET | Freq: Every day | ORAL | Status: DC
  Administered 2018-02-18 – 2018-02-22 (×5): 40 mg via ORAL
  Filled 2018-02-18 (×5): qty 1

## 2018-02-18 MED ORDER — NITROGLYCERIN 0.4 MG SL SUBL: 0.4000 mg | SUBLINGUAL_TABLET | SUBLINGUAL | Status: DC | PRN

## 2018-02-18 MED ORDER — LEVOTHYROXINE SODIUM 50 MCG PO TABS: 125.0000 ug | ORAL_TABLET | Freq: Every day | ORAL | Status: DC

## 2018-02-18 MED ORDER — POTASSIUM CHLORIDE CRYS ER 20 MEQ PO TBCR
40.0000 meq | EXTENDED_RELEASE_TABLET | Freq: Once | ORAL | Status: AC
  Administered 2018-02-18: 15:00:00 40 meq via ORAL
  Filled 2018-02-18 (×2): qty 2

## 2018-02-18 MED ORDER — POTASSIUM CHLORIDE IN NACL 40-0.9 MEQ/L-% IV SOLN
INTRAVENOUS | Status: DC
  Administered 2018-02-18: 75 mL/h via INTRAVENOUS
  Filled 2018-02-18 (×2): qty 1000

## 2018-02-18 MED ORDER — LEVOTHYROXINE SODIUM 50 MCG PO TABS
125.0000 ug | ORAL_TABLET | Freq: Every day | ORAL | Status: DC
  Administered 2018-02-19 – 2018-02-21 (×3): 125 ug via ORAL
  Filled 2018-02-18 (×4): qty 3

## 2018-02-18 MED ORDER — SIMVASTATIN 10 MG PO TABS
10.0000 mg | ORAL_TABLET | Freq: Every day | ORAL | Status: DC
  Administered 2018-02-18 – 2018-02-21 (×3): 10 mg via ORAL
  Filled 2018-02-18 (×5): qty 1

## 2018-02-18 MED ORDER — ONDANSETRON HCL 4 MG PO TABS: 4.0000 mg | ORAL_TABLET | Freq: Four times a day (QID) | ORAL | Status: DC | PRN

## 2018-02-18 MED ORDER — CLOPIDOGREL BISULFATE 75 MG PO TABS
75.0000 mg | ORAL_TABLET | Freq: Every day | ORAL | Status: DC
  Administered 2018-02-18 – 2018-02-22 (×5): 75 mg via ORAL
  Filled 2018-02-18 (×5): qty 1

## 2018-02-18 MED ORDER — ACETAMINOPHEN 325 MG PO TABS
650.0000 mg | ORAL_TABLET | Freq: Four times a day (QID) | ORAL | Status: DC | PRN
  Administered 2018-02-19 – 2018-02-21 (×4): 650 mg via ORAL
  Filled 2018-02-18 (×4): qty 2

## 2018-02-18 MED ORDER — OCUVITE-LUTEIN PO CAPS
1.0000 | ORAL_CAPSULE | Freq: Every day | ORAL | Status: DC
  Administered 2018-02-18 – 2018-02-21 (×4): 1 via ORAL
  Filled 2018-02-18 (×6): qty 1

## 2018-02-18 MED ORDER — SODIUM CHLORIDE 0.9 % IV SOLN
2.0000 g | INTRAVENOUS | Status: DC
  Administered 2018-02-18: 2 g via INTRAVENOUS
  Filled 2018-02-18: qty 20
  Filled 2018-02-18: qty 2

## 2018-02-18 MED ORDER — MAGNESIUM SULFATE 2 GM/50ML IV SOLN
2.0000 g | Freq: Once | INTRAVENOUS | Status: AC
  Administered 2018-02-18: 21:00:00 2 g via INTRAVENOUS
  Filled 2018-02-18: qty 50

## 2018-02-18 MED ORDER — CALCIUM CARBONATE ANTACID 500 MG PO CHEW
1250.0000 mg | CHEWABLE_TABLET | Freq: Two times a day (BID) | ORAL | Status: DC
  Administered 2018-02-18: 1250 mg via ORAL
  Filled 2018-02-18 (×2): qty 7

## 2018-02-18 MED ORDER — AMLODIPINE BESYLATE 5 MG PO TABS
5.0000 mg | ORAL_TABLET | Freq: Every day | ORAL | Status: DC
  Administered 2018-02-20 – 2018-02-22 (×3): 5 mg via ORAL
  Filled 2018-02-18 (×4): qty 1

## 2018-02-18 MED ORDER — METOPROLOL TARTRATE 25 MG PO TABS
25.0000 mg | ORAL_TABLET | Freq: Two times a day (BID) | ORAL | Status: DC
  Administered 2018-02-19 – 2018-02-22 (×7): 25 mg via ORAL
  Filled 2018-02-18 (×7): qty 1

## 2018-02-18 MED ORDER — INSULIN ASPART 100 UNIT/ML ~~LOC~~ SOLN
0.0000 [IU] | Freq: Every day | SUBCUTANEOUS | Status: DC
  Administered 2018-02-21: 22:00:00 3 [IU] via SUBCUTANEOUS
  Filled 2018-02-18: qty 1

## 2018-02-18 MED ORDER — CALCIUM CARBONATE ANTACID 500 MG PO CHEW
1250.0000 mg | CHEWABLE_TABLET | Freq: Two times a day (BID) | ORAL | Status: DC
  Administered 2018-02-19 – 2018-02-21 (×6): 1250 mg via ORAL
  Filled 2018-02-18 (×5): qty 7

## 2018-02-18 MED ORDER — ACETAMINOPHEN 10 MG/ML IV SOLN
1000.0000 mg | Freq: Four times a day (QID) | INTRAVENOUS | Status: DC
  Administered 2018-02-18 (×2): 1000 mg via INTRAVENOUS
  Filled 2018-02-18 (×2): qty 100

## 2018-02-18 MED ORDER — VANCOMYCIN HCL IN DEXTROSE 1-5 GM/200ML-% IV SOLN
1000.0000 mg | Freq: Once | INTRAVENOUS | Status: AC
  Administered 2018-02-18: 1000 mg via INTRAVENOUS
  Filled 2018-02-18: qty 200

## 2018-02-18 MED ORDER — MAGNESIUM SULFATE 2 GM/50ML IV SOLN
2.0000 g | Freq: Once | INTRAVENOUS | Status: AC
  Administered 2018-02-18: 2 g via INTRAVENOUS
  Filled 2018-02-18: qty 50

## 2018-02-18 MED ORDER — ASPIRIN EC 81 MG PO TBEC
81.0000 mg | DELAYED_RELEASE_TABLET | Freq: Every day | ORAL | Status: DC
  Administered 2018-02-19 – 2018-02-22 (×4): 81 mg via ORAL
  Filled 2018-02-18 (×4): qty 1

## 2018-02-18 NOTE — Progress Notes (Signed)
Anticoagulation monitoring(Lovenox):  77 yo female ordered Lovenox 40 mg Q24h  Filed Weights   02/18/18 1013  Weight: 135 lb (61.2 kg)   BMI 22.8  Lab Results  Component Value Date   CREATININE 1.42 (H) 02/18/2018   CREATININE 0.60 05/11/2017   CREATININE 0.55 (L) 06/09/2016   Estimated Creatinine Clearance: 29.3 mL/min (A) (by C-G formula based on SCr of 1.42 mg/dL (H)). Hemoglobin & Hematocrit     Component Value Date/Time   HGB 7.9 (L) 02/18/2018 1032   HGB 10.6 (L) 09/29/2015 0930   HCT 26.5 (L) 02/18/2018 1032   HCT 34.2 09/29/2015 0930     Per Protocol for Patient with estCrcl < 30 ml/min and BMI < 40, will transition to Lovenox 30 mg Q24h.

## 2018-02-18 NOTE — ED Provider Notes (Addendum)
[facility] Emergency Department Provider Note  ____________________________________________  Time seen: Approximately 10:45 AM  I have reviewed the triage vital signs and the nursing notes.   HISTORY  Chief Complaint Abdominal Pain and Dysuria    HPI April Pittman is a 77 y.o. female with a history of diabetes, hypertension, GERD who complains of abdominal pain for the past week, gradual onset, generalized, associated with nausea and "feeling bad".  She also has a nonproductive cough.  Yesterday she started having worsening pain, and this morning she started having vomiting and diarrhea.  Seen initially in [place] [facility], found to have grossly abnormal vital signs and sent to the ED for further management.      Past Medical History:  Diagnosis Date  . Alopecia   . Anginal pain ([place])   . Benign neoplasm of large bowel   . CAD (coronary artery disease)   . Carpal tunnel syndrome   . Complication of anesthesia    vomits with any pain medicine  . DM (diabetes mellitus) ([place])   . Erosion of cervix   . GERD (gastroesophageal reflux disease)   . H/O adenomatous polyp of colon    with sever dysplasia, removed 2000 by colonoscopy  . History of chicken pox   . Hypercholesteremia   . Hypertension   . Hypothyroid   . Irritable bowel   . Malignant melanoma of foot ([place])   . Osteoporosis   . PONV (postoperative nausea and vomiting)   . Post-menopausal bleeding   . Procidentia of uterus    with traction cystocele, managed with gellhorn pessary  . Rectal bleeding   . TIA (transient ischemic attack)      Patient Active Problem List   Diagnosis Date Noted  . Cataract cortical, senile, bilateral 05/11/2017  . Primary osteoarthritis of right knee 11/02/2015  . Vitamin D deficiency 08/14/2014  . Rectal/anal hemorrhage 06/14/2014  . Hypothyroidism 06/14/2014  . Hypertension 06/14/2014  . Hypercholesteremia 06/14/2014  . TIA (transient ischemic  attack) 06/14/2014  . GERD (gastroesophageal reflux disease) 06/14/2014  . Alopecia 06/14/2014  . Malignant melanoma of foot ([place]) 06/14/2014  . Irritable bowel 06/14/2014  . Coronary artery disease 06/14/2014  . Diabetes mellitus, type 2 ([place]) 06/14/2014  . Carpal tunnel syndrome 06/14/2014  . Procidentia of uterus 06/14/2014  . Osteoporosis 06/14/2014  . History of adenomatous polyp of colon 06/14/2014  . B12 deficiency 08/08/2008  . Colon, diverticulosis 05/14/2008     Past Surgical History:  Procedure Laterality Date  . BREAST EXCISIONAL BIOPSY Right    Benign  . CARDIAC CATHETERIZATION  10/2010   +stenting to LAD and RCA; symptoms: chest pain, elevated Troponin, +AMI. [employer]  . CATARACT EXTRACTION W/PHACO Right 07/28/2016   Procedure: CATARACT EXTRACTION PHACO AND INTRAOCULAR LENS PLACEMENT ([place])  Right Diabetic;  Surgeon: Leandrew Koyanagi, MD;  Location: [facility];  Service: Ophthalmology;  Laterality: Right;  Diabetic leave patient at 9 arrival  . CATARACT EXTRACTION W/PHACO Left 10/27/2016   Procedure: CATARACT EXTRACTION PHACO AND INTRAOCULAR LENS PLACEMENT ([place]) LEFT DIABETIC;  Surgeon: Leandrew Koyanagi, MD;  Location: [facility];  Service: Ophthalmology;  Laterality: Left;  . COLONOSCOPY  03/2008   2 polyps, internal and external hemorrhoids, +anal growth. Dr. Vira Agar  . KNEE SURGERY    . TONSILLECTOMY     as a child     Prior to Admission medications   Medication Sig Start Date End Date Taking? Authorizing Provider  alendronate (FOSAMAX) 70 MG tablet Take  1 tablet (70 mg total) by mouth every 7 (seven) days. Take with a full glass of water on an empty stomach. 11/19/16   Birdie Sons, MD  amLODipine (NORVASC) 5 MG tablet TAKE 1 TABLET (5 MG TOTAL) BY MOUTH DAILY. 10/31/17   Birdie Sons, MD  aspirin EC 81 MG tablet Take 81 mg by mouth daily. 03/20/14   [provider]  Blood Glucose Monitoring Suppl (ONE TOUCH ULTRA MINI)  W/DEVICE KIT  02/27/14   [provider]  calcium carbonate (OS-CAL) 1250 (500 Ca) MG chewable tablet Chew 1 tablet by mouth 2 (two) times daily.    [provider]  clopidogrel (PLAVIX) 75 MG tablet Take 75 mg by mouth daily. 03/20/14   [provider]  diclofenac sodium (VOLTAREN) 1 % GEL Apply 2-4 g topically 4 (four) times daily. 05/11/17   Birdie Sons, MD  estradiol (ESTRACE) 0.1 MG/GM vaginal cream Place 1 Applicatorful vaginally once a week. 03/20/14   [provider]  glucose blood (ONE TOUCH ULTRA TEST) test strip USE TO CHECK BLOOD SUGAR DAILY. E 11.9 (TYPE 2 DIABETES MELLITIS) 01/27/18   Birdie Sons, MD  levothyroxine (SYNTHROID, LEVOTHROID) 125 MCG tablet TAKE 1 TABLET BY MOUTH IN THE MORNING ON AN EMPTY STOMACH FOR THYROID 10/27/17   Birdie Sons, MD  lisinopril-hydrochlorothiazide (PRINZIDE,ZESTORETIC) 20-25 MG tablet Take 1 tablet by mouth daily. 10/27/17   Birdie Sons, MD  metFORMIN (GLUCOPHAGE) 1000 MG tablet Take 1 tablet (1,000 mg total) by mouth 2 (two) times daily. 10/27/17   Birdie Sons, MD  metoprolol (LOPRESSOR) 50 MG tablet Take 50 mg by mouth 2 (two) times daily.  03/20/14   [provider]  Multiple Vitamins-Minerals (PRESERVISION AREDS 2 PO) Take 2 capsules by mouth daily.    [provider]  nitroGLYCERIN (NITROSTAT) 0.4 MG SL tablet Place 1 tablet (0.4 mg total) under the tongue every 5 (five) minutes as needed for chest pain. 09/29/15   Birdie Sons, MD  [place]  02/27/14   [provider]  OXYQUINOLONE SULFATE VAGINAL 0.025 % GEL Place 1 application vaginally once a week. 05/09/17   Defrancesco, Alanda Slim, MD  simvastatin (ZOCOR) 40 MG tablet TAKE 1 TABLET BY MOUTH EVERY DAY FOR CHOLESTEROL 06/03/17   Birdie Sons, MD  vitamin B-12 (CYANOCOBALAMIN) 1000 MCG tablet Take 1,000 mcg by mouth daily.     [provider]     Allergies Patient has no known  allergies.   Family History  Problem Relation Age of Onset  . Lung cancer Brother   . Diabetes Father   . Heart disease Sister        Valvular rheumatic heart disease  . Heart disease Paternal Grandmother   . Breast cancer Neg Hx   . Ovarian cancer Neg Hx   . Colon cancer Neg Hx     Social History Social History   Tobacco Use  . Smoking status: Never Smoker  . Smokeless tobacco: Never Used  Substance Use Topics  . Alcohol use: No    Alcohol/week: 0.0 standard drinks  . Drug use: No    Review of Systems  Constitutional:   No fever or chills.  ENT:   No sore throat. No rhinorrhea. Cardiovascular:   No chest pain or syncope. Respiratory:   No dyspnea positive nonproductive cough. Gastrointestinal:   Positive as above for abdominal pain, vomiting and diarrhea.  Musculoskeletal:   Negative for focal  pain or swelling All other systems reviewed and are negative except as documented above in ROS and HPI.  ____________________________________________   PHYSICAL EXAM:  VITAL SIGNS: ED Triage Vitals  Enc Vitals Group     BP 02/18/18 1011 (!) 100/45     Pulse Rate 02/18/18 1011 (!) 112     Resp 02/18/18 1011 (!) 24     Temp 02/18/18 1011 (!) 102.5 F (39.2 C)     Temp Source 02/18/18 1011 Oral     SpO2 02/18/18 1011 95 %     Weight 02/18/18 1013 135 lb (61.2 kg)     Height 02/18/18 1013 5' 4.5" (1.638 m)     Head Circumference --      Peak Flow --      Pain Score 02/18/18 1012 5     Pain Loc --      Pain Edu? --      Excl. in [place]? --     Vital signs reviewed, nursing assessments reviewed.   Constitutional:   Alert and oriented.  Ill-appearing. Eyes:   Conjunctivae are normal. EOMI. PERRL. ENT      Head:   Normocephalic and atraumatic.      Nose:   No congestion/rhinnorhea.       Mouth/Throat:   Dry mucous membranes, no pharyngeal erythema. No peritonsillar mass.       Neck:   No meningismus. Full ROM. Hematological/Lymphatic/Immunilogical:   No cervical  lymphadenopathy. Cardiovascular:   Tachycardia heart rate 115. Symmetric bilateral radial and DP pulses.  No murmurs. Cap refill less than 2 seconds. Respiratory:   Normal respiratory effort without tachypnea/retractions. Breath sounds are clear and equal bilaterally. No wheezes/rales/rhonchi. Gastrointestinal:   Soft and nontender. Non distended.  No rebound, rigidity, or guarding.  Rectal exam performed with nurse Stanton Kidney at bedside.  Thin secretions, Hemoccult negative  Musculoskeletal:   Normal range of motion in all extremities. No joint effusions.  No lower extremity tenderness.  No edema. Neurologic:   Normal speech and language.  Motor grossly intact. No acute focal neurologic deficits are appreciated.  Skin:    Skin is warm, dry and intact. No rash noted.  No petechiae, purpura, or bullae.  ____________________________________________    LABS (pertinent positives/negatives) (all labs ordered are listed, but only abnormal results are displayed) Labs Reviewed  COMPREHENSIVE METABOLIC PANEL - Abnormal; Notable for the following components:      Result Value   Sodium 132 (*)    Potassium 2.9 (*)    Chloride 95 (*)    Glucose, Bld 255 (*)    BUN 24 (*)    Creatinine, Ser 1.42 (*)    Calcium 8.2 (*)    Albumin 3.2 (*)    GFR calc non Af Amer 36 (*)    GFR calc Af Amer 41 (*)    All other components within normal limits  CBC WITH DIFFERENTIAL/PLATELET - Abnormal; Notable for the following components:   WBC 24.4 (*)    RBC 3.86 (*)    Hemoglobin 7.9 (*)    HCT 26.5 (*)    MCV 68.7 (*)    MCH 20.5 (*)    MCHC 29.8 (*)    RDW 17.1 (*)    Platelets 424 (*)    Neutro Abs 21.8 (*)    Lymphs Abs 0.4 (*)    Monocytes Absolute 1.4 (*)    Abs Immature Granulocytes 0.49 (*)    All other components within normal limits  URINALYSIS, COMPLETE (UACMP)  WITH MICROSCOPIC - Abnormal; Notable for the following components:   Color, Urine AMBER (*)    APPearance CLOUDY (*)    Glucose, UA  150 (*)    Hgb urine dipstick MODERATE (*)    Protein, ur 100 (*)    Leukocytes, UA LARGE (*)    RBC / HPF >50 (*)    WBC, UA >50 (*)    Bacteria, UA MANY (*)    All other components within normal limits  CG4 I-STAT (LACTIC ACID) - Abnormal; Notable for the following components:   Lactic Acid, Venous 3.85 (*)    All other components within normal limits  CULTURE, BLOOD (ROUTINE X 2)  CULTURE, BLOOD (ROUTINE X 2)  URINE CULTURE  PROTIME-INR  LIPASE, BLOOD  INFLUENZA PANEL BY PCR (TYPE A & B)  PROCALCITONIN  LACTIC ACID, PLASMA  I-STAT CG4 LACTIC ACID, ED  I-STAT CG4 LACTIC ACID, ED   ____________________________________________   EKG Interpreted by me Sinus tachycardia rate 109, normal axis and intervals.  Right bundle branch block.  No acute ischemic changes.   ____________________________________________    RADIOLOGY  Ct Abdomen Pelvis Wo Contrast  Result Date: 02/18/2018 CLINICAL DATA:  Abdominal pain with nausea, vomiting, and diarrhea. EXAM: CT ABDOMEN AND PELVIS WITHOUT CONTRAST TECHNIQUE: Multidetector CT imaging of the abdomen and pelvis was performed following the standard protocol without IV contrast. COMPARISON:  None. FINDINGS: Lower chest: Moderate hiatal hernia. Heart size is normal. Hepatobiliary: Slight hepatomegaly. Increased density in the dependent portion of the gallbladder consistent with gallstones. Gallbladder wall is not thickened. No dilated bile ducts. Pancreas: Unremarkable. No pancreatic ductal dilatation or surrounding inflammatory changes. Spleen: Normal in size without focal abnormality. Adrenals/Urinary Tract: Adrenal glands are normal. There is slight soft tissue stranding in the perinephric space bilaterally, left more than right. There is a tiny amount of fluid in the right pericolic gutter. No stones or hydronephrosis. No visible renal masses. Stomach/Bowel: Moderate hiatal hernia. Scattered diverticula in the distal colon. The bowel otherwise  appears normal. Vascular/Lymphatic: Aortic atherosclerosis. No enlarged abdominal or pelvic lymph nodes. Reproductive: Uterus and bilateral adnexa are unremarkable. Pessary in place. Other: No ascites in the pelvis. No hernias. Musculoskeletal: No acute or significant osseous findings. IMPRESSION: 1. Slight soft tissue stranding in the perinephric space bilaterally with a tiny amount of fluid in the right pericolic gutter. The possibility of pyelonephritis should be considered. 2. Cholelithiasis. 3. Moderate hiatal hernia. Aortic Atherosclerosis (ICD10-I70.0). Electronically Signed   By: Lorriane Shire M.D.   On: 02/18/2018 11:39   Dg Chest 2 View  Result Date: 02/18/2018 CLINICAL DATA:  Fever. Nausea, vomiting, and diarrhea. EXAM: CHEST - 2 VIEW COMPARISON:  04/01/2008 FINDINGS: The heart size is normal. There is slight pulmonary vascular prominence. Slight atelectasis at the left lung base. No consolidative infiltrates or effusions. No acute bone abnormality. IMPRESSION: Slight pulmonary vascular prominence with slight atelectasis at the left lung base. Electronically Signed   By: Lorriane Shire M.D.   On: 02/18/2018 11:32    ____________________________________________   PROCEDURES .Critical Care Performed by: Carrie Mew, MD Authorized by: Carrie Mew, MD   Critical care provider statement:    Critical care time (minutes):  35   Critical care time was exclusive of:  Separately billable procedures and treating other patients   Critical care was necessary to treat or prevent imminent or life-threatening deterioration of the following conditions:  Sepsis   Critical care was time spent personally by me on the following activities:  Development of treatment plan with patient or surrogate, discussions with consultants, evaluation of patient's response to treatment, examination of patient, obtaining history from patient or surrogate, ordering and performing treatments and interventions,  ordering and review of laboratory studies, ordering and review of radiographic studies, pulse oximetry, re-evaluation of patient's condition and review of old charts    ____________________________________________  DIFFERENTIAL DIAGNOSIS   Influenza, gastroenteritis, biliary disease, pancreatitis, appendicitis, urinary tract infection with obstructed stone.  Less likely vascular disease such as AAA dissection or mesenteric ischemia  CLINICAL IMPRESSION / ASSESSMENT AND PLAN / ED COURSE  Pertinent labs & imaging results that were available during my care of the patient were reviewed by me and considered in my medical decision making (see chart for details).    Patient presents with fever tachycardia tachypnea, borderline blood pressure although not hypotensive on arrival.  Exam concerning for sepsis due to intra-abdominal source or urinary source.  This may also be viral with influenza.  Symptoms have been ongoing for a week.  I will start empiric antibiotics, 2 L IV fluid bolus for resuscitation.  IV Tylenol for pain and fever, Zofran for nausea.  Due to her comorbidities, age, and acute critical illness with a lactate of 3.8, also obtain a Noncon CT scan.  Clinical Course as of Feb 19 1207  Sat Feb 18, 2018  1056 Chest x-ray image viewed by me, unremarkable.  No pneumothorax or pulmonary edema or infiltrate.   [PS]  1110 Labs reveal multiple abnormalities.  Urinalysis is clearly consistent with urinary tract infection.  CBC shows a leukocytosis of 24,000 as well as acute anemia of 7.9 hemoglobin compared to a baseline of 10-11.  Also has hypokalemia to 2.9 and acute kidney injury with a creatinine of 1.4 compared to a baseline of 0.6.  All indicative of severe sepsis, most likely from the urinary source.  Unclear the reason for her acute anemia..   [PS]    Clinical Course User Index [PS] Carrie Mew, MD     ----------------------------------------- 12:08 PM on  02/18/2018 -----------------------------------------  CT consistent with pyelonephritis which matches the rest of the clinical picture.  Clinically no signs of GI bleed, unclear the reason for her anemia although this may be chronic as last value was from 2 years ago..  I will discuss with the hospitalist for admission and further management.  ____________________________________________   FINAL CLINICAL IMPRESSION(S) / ED DIAGNOSES    Final diagnoses:  Sepsis with acute renal failure, due to unspecified organism, unspecified acute renal failure type, unspecified whether septic shock present ([place])  Pyelonephritis  Anemia, unspecified   ED Discharge Orders    None      Portions of this note were generated with dragon dictation software. Dictation errors may occur despite best attempts at proofreading.   Carrie Mew, MD 02/18/18 1209    Carrie Mew, MD 02/18/18 [phone]

## 2018-02-18 NOTE — Progress Notes (Addendum)
Patient ID: April Pittman, female   DOB: Jul 13, 1940, 77 y.o.   MRN: [id number]  ACP note  Patient and family at the bedside  Diagnosis.  Clinical sepsis, acute cystitis, lactic acidosis, elevated procalcitonin, hypokalemia, acute kidney injury, anemia, hiatal hernia, type 2 diabetes  CODE STATUS discussed and patient is a full code.  Plan.  Follow-up cultures, empiric antibiotics, IV fluid hydration.  Replace potassium and magnesium.  Monitor closely.  Time spent on ACP discussion 17 minutes Dr. Loletha Grayer

## 2018-02-18 NOTE — Progress Notes (Signed)
CODE SEPSIS - PHARMACY COMMUNICATION  **Broad Spectrum Antibiotics should be administered within 1 hour of Sepsis diagnosis**  Time Code Sepsis Called/Page Received: 10:43  Antibiotics Ordered: cefepime/vanc  Time of 1st antibiotic administration: 11:23  Additional action taken by pharmacy: 11:16 spoke with ED RN to let them know we have until 11:43 to start cefepime.   If necessary, Name of Provider/Nurse Contacted: Louretta Parma ,PharmD, BCPS Clinical Pharmacist  02/18/2018  10:53 AM

## 2018-02-18 NOTE — ED Notes (Signed)
Report received - pt a septic workup with lactic 3.85 and wbc. Pt currently off the floor for test.

## 2018-02-18 NOTE — ED Notes (Signed)
Report given to 1c

## 2018-02-18 NOTE — ED Notes (Signed)
Pt states no longer nauseated - hold zofran

## 2018-02-18 NOTE — ED Triage Notes (Signed)
Pt to ED via POV from [facility] for evaluation of abdominal pain, N/V/D, and dysuria. Pt appears pale upon assessment. Pt is febrile and tachycardic in triage.

## 2018-02-18 NOTE — Progress Notes (Signed)
PHARMACY - PHYSICIAN COMMUNICATION CRITICAL VALUE ALERT - BLOOD CULTURE IDENTIFICATION (BCID)  Results for orders placed or performed during the hospital encounter of 02/18/18  Blood Culture ID Panel (Reflexed) (Collected: 02/18/2018 10:32 AM)  Result Value Ref Range   Enterococcus species NOT DETECTED NOT DETECTED   Listeria monocytogenes NOT DETECTED NOT DETECTED   Staphylococcus species NOT DETECTED NOT DETECTED   Staphylococcus aureus (BCID) NOT DETECTED NOT DETECTED   Streptococcus species NOT DETECTED NOT DETECTED   Streptococcus agalactiae NOT DETECTED NOT DETECTED   Streptococcus pneumoniae NOT DETECTED NOT DETECTED   Streptococcus pyogenes NOT DETECTED NOT DETECTED   Acinetobacter baumannii NOT DETECTED NOT DETECTED   Enterobacteriaceae species DETECTED (A) NOT DETECTED   Enterobacter cloacae complex NOT DETECTED NOT DETECTED   Escherichia coli DETECTED (A) NOT DETECTED   Klebsiella oxytoca NOT DETECTED NOT DETECTED   Klebsiella pneumoniae NOT DETECTED NOT DETECTED   Proteus species NOT DETECTED NOT DETECTED   Serratia marcescens NOT DETECTED NOT DETECTED   Carbapenem resistance NOT DETECTED NOT DETECTED   Haemophilus influenzae NOT DETECTED NOT DETECTED   Neisseria meningitidis NOT DETECTED NOT DETECTED   Pseudomonas aeruginosa NOT DETECTED NOT DETECTED   Candida albicans NOT DETECTED NOT DETECTED   Candida glabrata NOT DETECTED NOT DETECTED   Candida krusei NOT DETECTED NOT DETECTED   Candida parapsilosis NOT DETECTED NOT DETECTED   Candida tropicalis NOT DETECTED NOT DETECTED    Name of physician (or Provider) Contacted: [place]   Changes to prescribed antibiotics required:  No, pt is currently on recommended abx.   Azha Constantin D 02/18/2018  10:16 PM

## 2018-02-18 NOTE — ED Notes (Signed)
Pt arrives from home, pt is here with her sister. Pt states that she just recently started to feel bad, pt asked what time frame was recent for her, she states for the past week, pt states that she is having lower abd pain, states that she has been having painful urination since last week also, states that now she is vomiting, pt is hot to touch. Pt reports that she feels nauseated at this time. Sister at bedside, pt placed on cardiac monitor, Dr Joni Fears to bedside

## 2018-02-18 NOTE — ED Triage Notes (Signed)
First Nurse Note:  Sent from [facility] for ED evaluation of fever, tachycardia, dysuria and abdominal pain.  Patient AAOx3.  Skin warm and dry. NAD

## 2018-02-18 NOTE — H&P (Signed)
[facility] [facility] [place] NAME: April Pittman    MR#:  [id number]  DATE OF BIRTH:  06-05-1940  DATE OF ADMISSION:  02/18/2018  PRIMARY CARE PHYSICIAN: Birdie Sons, MD   REQUESTING/REFERRING PHYSICIAN: Dr Carrie Mew  CHIEF COMPLAINT:   Chief Complaint  Patient presents with  . Abdominal Pain  . Dysuria    HISTORY OF PRESENT ILLNESS:  April Pittman  is a 77 y.o. female presenting with not feeling good for the past few days.  She complains of some burning in the stomach and burning on urination.  She started vomiting this morning and had an episode of diarrhea.  She stayed in bed all day yesterday.  She is very thirsty.  She states the pain in her stomach is not that bad now but was pretty severe earlier.  Did not give a quantity when I tried to specifically ask.  In the ER, she was diagnosed with sepsis and found to have a positive urine analysis.  Hospitalist services contacted for further evaluation.  PAST MEDICAL HISTORY:   Past Medical History:  Diagnosis Date  . Alopecia   . Anginal pain ([place])   . Benign neoplasm of large bowel   . CAD (coronary artery disease)   . Carpal tunnel syndrome   . Complication of anesthesia    vomits with any pain medicine  . DM (diabetes mellitus) ([place])   . Erosion of cervix   . GERD (gastroesophageal reflux disease)   . H/O adenomatous polyp of colon    with sever dysplasia, removed 2000 by colonoscopy  . History of chicken pox   . Hypercholesteremia   . Hypertension   . Hypothyroid   . Irritable bowel   . Malignant melanoma of foot ([place])   . Osteoporosis   . PONV (postoperative nausea and vomiting)   . Post-menopausal bleeding   . Procidentia of uterus    with traction cystocele, managed with gellhorn pessary  . Rectal bleeding   . TIA (transient ischemic attack)     PAST SURGICAL HISTORY:   Past Surgical History:  Procedure Laterality Date  . BREAST EXCISIONAL BIOPSY  Right    Benign  . CARDIAC CATHETERIZATION  10/2010   +stenting to LAD and RCA; symptoms: chest pain, elevated Troponin, +AMI. [employer]  . CATARACT EXTRACTION W/PHACO Right 07/28/2016   Procedure: CATARACT EXTRACTION PHACO AND INTRAOCULAR LENS PLACEMENT ([place])  Right Diabetic;  Surgeon: Leandrew Koyanagi, MD;  Location: [facility];  Service: Ophthalmology;  Laterality: Right;  Diabetic leave patient at 9 arrival  . CATARACT EXTRACTION W/PHACO Left 10/27/2016   Procedure: CATARACT EXTRACTION PHACO AND INTRAOCULAR LENS PLACEMENT ([place]) LEFT DIABETIC;  Surgeon: Leandrew Koyanagi, MD;  Location: [facility];  Service: Ophthalmology;  Laterality: Left;  . COLONOSCOPY  03/2008   2 polyps, internal and external hemorrhoids, +anal growth. Dr. Vira Agar  . KNEE SURGERY    . TONSILLECTOMY     as a child    SOCIAL HISTORY:   Social History   Tobacco Use  . Smoking status: Never Smoker  . Smokeless tobacco: Never Used  Substance Use Topics  . Alcohol use: No    Alcohol/week: 0.0 standard drinks    FAMILY HISTORY:   Family History  Problem Relation Age of Onset  . Lung cancer Brother   . Diabetes Father   . Heart disease Sister        Valvular rheumatic heart disease  . Rheumatic fever Sister   .  Heart disease Paternal Grandmother   . Breast cancer Neg Hx   . Ovarian cancer Neg Hx   . Colon cancer Neg Hx     DRUG ALLERGIES:  No Known Allergies  REVIEW OF SYSTEMS:  CONSTITUTIONAL: Some fever.  Some chills.  Positive for fatigue.  EYES: Has poor vision with macular degeneration. EARS, NOSE, AND THROAT: No tinnitus or ear pain. No sore throat RESPIRATORY: No cough, shortness of breath, wheezing or hemoptysis.  CARDIOVASCULAR: No chest pain, orthopnea, edema.  GASTROINTESTINAL: Positive for nausea, vomiting, diarrhea and abdominal pain. No blood in bowel movements GENITOURINARY: Positive for dysuria.  No hematuria.  ENDOCRINE: No polyuria, nocturia,   HEMATOLOGY: No anemia, easy bruising or bleeding SKIN: No rash or lesion. MUSCULOSKELETAL: Positive for ankle pain NEUROLOGIC: No tingling, numbness, weakness.  PSYCHIATRY: Positive for depression.   MEDICATIONS AT HOME:   Prior to Admission medications   Medication Sig Start Date End Date Taking? Authorizing Provider  alendronate (FOSAMAX) 70 MG tablet Take 1 tablet (70 mg total) by mouth every 7 (seven) days. Take with a full glass of water on an empty stomach. 11/19/16   Birdie Sons, MD  amLODipine (NORVASC) 5 MG tablet TAKE 1 TABLET (5 MG TOTAL) BY MOUTH DAILY. 10/31/17   Birdie Sons, MD  aspirin EC 81 MG tablet Take 81 mg by mouth daily. 03/20/14   [provider]  calcium carbonate (OS-CAL) 1250 (500 Ca) MG chewable tablet Chew 1 tablet by mouth 2 (two) times daily.    [provider]  clopidogrel (PLAVIX) 75 MG tablet Take 75 mg by mouth daily. 03/20/14   [provider]  diclofenac sodium (VOLTAREN) 1 % GEL Apply 2-4 g topically 4 (four) times daily. 05/11/17   Birdie Sons, MD  estradiol (ESTRACE) 0.1 MG/GM vaginal cream Place 1 Applicatorful vaginally once a week. 03/20/14   [provider]  glucose blood (ONE TOUCH ULTRA TEST) test strip USE TO CHECK BLOOD SUGAR DAILY. E 11.9 (TYPE 2 DIABETES MELLITIS) 01/27/18   Birdie Sons, MD  levothyroxine (SYNTHROID, LEVOTHROID) 125 MCG tablet TAKE 1 TABLET BY MOUTH IN THE MORNING ON AN EMPTY STOMACH FOR THYROID 10/27/17   Birdie Sons, MD  lisinopril-hydrochlorothiazide (PRINZIDE,ZESTORETIC) 20-25 MG tablet Take 1 tablet by mouth daily. 10/27/17   Birdie Sons, MD  metFORMIN (GLUCOPHAGE) 1000 MG tablet Take 1 tablet (1,000 mg total) by mouth 2 (two) times daily. 10/27/17   Birdie Sons, MD  metoprolol (LOPRESSOR) 50 MG tablet Take 50 mg by mouth 2 (two) times daily.  03/20/14   [provider]  Multiple Vitamins-Minerals (PRESERVISION AREDS 2 PO) Take 2 capsules by mouth daily.     [provider]  nitroGLYCERIN (NITROSTAT) 0.4 MG SL tablet Place 1 tablet (0.4 mg total) under the tongue every 5 (five) minutes as needed for chest pain. 09/29/15   Birdie Sons, MD  [place]  02/27/14   [provider]  OXYQUINOLONE SULFATE VAGINAL 0.025 % GEL Place 1 application vaginally once a week. 05/09/17   Defrancesco, Alanda Slim, MD  simvastatin (ZOCOR) 40 MG tablet TAKE 1 TABLET BY MOUTH EVERY DAY FOR CHOLESTEROL 06/03/17   Birdie Sons, MD  vitamin B-12 (CYANOCOBALAMIN) 1000 MCG tablet Take 1,000 mcg by mouth daily.     [provider]   Medication reconciliation still undergoing.  VITAL SIGNS:  Blood pressure 124/65, pulse (!) 104, temperature (!) 102.5 F (39.2 C), temperature source Oral,  resp. rate 16, height 5' 4.5" (1.638 m), weight 61.2 kg, SpO2 93 %.  PHYSICAL EXAMINATION:  GENERAL:  77 y.o.-year-old patient lying in the bed with no acute distress.  EYES: Pupils equal, round, reactive to light and accommodation. No scleral icterus. Extraocular muscles intact.  HEENT: Head atraumatic, normocephalic. Oropharynx and nasopharynx clear.  NECK:  Supple, no jugular venous distention. No thyroid enlargement, no tenderness.  LUNGS: Normal breath sounds bilaterally, no wheezing, rales,rhonchi or crepitation. No use of accessory muscles of respiration.  CARDIOVASCULAR: S1, S2 tachycardic. No murmurs, rubs, or gallops.  ABDOMEN: Soft, epigastric and lower abdominal tenderness, nondistended. Bowel sounds present. No organomegaly or mass.  EXTREMITIES: No pedal edema, cyanosis, or clubbing.  NEUROLOGIC: Cranial nerves II through XII are intact. Muscle strength 5/5 in all extremities. Sensation intact. Gait not checked.  PSYCHIATRIC: The patient is alert and oriented x 3.  SKIN: No rash, lesion, or ulcer.   LABORATORY PANEL:   CBC Recent Labs  Lab 02/18/18 1032  WBC 24.4*  HGB 7.9*  HCT 26.5*  PLT 424*    ------------------------------------------------------------------------------------------------------------------  Chemistries  Recent Labs  Lab 02/18/18 1032  NA 132*  K 2.9*  CL 95*  CO2 23  GLUCOSE 255*  BUN 24*  CREATININE 1.42*  CALCIUM 8.2*  AST 28  ALT 11  ALKPHOS 99  BILITOT 0.9   ------------------------------------------------------------------------------------------------------------------    RADIOLOGY:  Ct Abdomen Pelvis Wo Contrast  Result Date: 02/18/2018 CLINICAL DATA:  Abdominal pain with nausea, vomiting, and diarrhea. EXAM: CT ABDOMEN AND PELVIS WITHOUT CONTRAST TECHNIQUE: Multidetector CT imaging of the abdomen and pelvis was performed following the standard protocol without IV contrast. COMPARISON:  None. FINDINGS: Lower chest: Moderate hiatal hernia. Heart size is normal. Hepatobiliary: Slight hepatomegaly. Increased density in the dependent portion of the gallbladder consistent with gallstones. Gallbladder wall is not thickened. No dilated bile ducts. Pancreas: Unremarkable. No pancreatic ductal dilatation or surrounding inflammatory changes. Spleen: Normal in size without focal abnormality. Adrenals/Urinary Tract: Adrenal glands are normal. There is slight soft tissue stranding in the perinephric space bilaterally, left more than right. There is a tiny amount of fluid in the right pericolic gutter. No stones or hydronephrosis. No visible renal masses. Stomach/Bowel: Moderate hiatal hernia. Scattered diverticula in the distal colon. The bowel otherwise appears normal. Vascular/Lymphatic: Aortic atherosclerosis. No enlarged abdominal or pelvic lymph nodes. Reproductive: Uterus and bilateral adnexa are unremarkable. Pessary in place. Other: No ascites in the pelvis. No hernias. Musculoskeletal: No acute or significant osseous findings. IMPRESSION: 1. Slight soft tissue stranding in the perinephric space bilaterally with a tiny amount of fluid in the right  pericolic gutter. The possibility of pyelonephritis should be considered. 2. Cholelithiasis. 3. Moderate hiatal hernia. Aortic Atherosclerosis (ICD10-I70.0). Electronically Signed   By: Lorriane Shire M.D.   On: 02/18/2018 11:39   Dg Chest 2 View  Result Date: 02/18/2018 CLINICAL DATA:  Fever. Nausea, vomiting, and diarrhea. EXAM: CHEST - 2 VIEW COMPARISON:  04/01/2008 FINDINGS: The heart size is normal. There is slight pulmonary vascular prominence. Slight atelectasis at the left lung base. No consolidative infiltrates or effusions. No acute bone abnormality. IMPRESSION: Slight pulmonary vascular prominence with slight atelectasis at the left lung base. Electronically Signed   By: Lorriane Shire M.D.   On: 02/18/2018 11:32    EKG:   Sinus tachycardia at 105 bpm.  Nonspecific ST-T wave changes.  IMPRESSION AND PLAN:   1.  Clinical sepsis.  Source likely acute cystitis.  Patient ordered sepsis antibiotics of  cefepime vancomycin and Flagyl via the emergency room.  I will switch over to Rocephin a little bit later.  Elevated procalcitonin. 2.  Lactic acidosis likely secondary to sepsis.  Hold Glucophage. 3.  Hypokalemia.  Check magnesium replace magnesium and replace potassium and IV fluids and orally. 4.  Acute kidney injury.  Should improve with IV fluid hydration. 5.  Anemia.  Add on iron studies and guaiac stools.  May end up needing a blood transfusion during this hospital course. 6.  Hiatal hernia moderate seen on CT scan.  Could be the cause of the patient's burning in the abdomen.  Will make sure she is on an acid blocking agent. 7.  Type 2 diabetes mellitus put on sliding scale insulin for now.  Hold Glucophage with lactic acidosis.    All the records are reviewed and case discussed with ED provider. Management plans discussed with the patient, family and they are in agreement.  CODE STATUS: Full code  TOTAL TIME TAKING CARE OF THIS PATIENT: 50 minutes, including acp time.     Loletha Grayer M.D on 02/18/2018 at 12:37 PM  Between 7am to 6pm - Pager - [phone]  After 6pm call admission pager [phone]  [facility] Office  [phone]  CC: Primary care physician; Birdie Sons, MD

## 2018-02-19 ENCOUNTER — Inpatient Hospital Stay: Payer: PPO

## 2018-02-19 LAB — VITAMIN B12
Vitamin B-12: 2900 pg/mL — ABNORMAL HIGH (ref 180–914)
Vitamin B-12: 3101 pg/mL — ABNORMAL HIGH (ref 180–914)

## 2018-02-19 LAB — CBC
HCT: 24.7 % — ABNORMAL LOW (ref 36.0–46.0)
Hemoglobin: 7.3 g/dL — ABNORMAL LOW (ref 12.0–15.0)
MCH: 20.9 pg — ABNORMAL LOW (ref 26.0–34.0)
MCHC: 29.6 g/dL — ABNORMAL LOW (ref 30.0–36.0)
MCV: 70.8 fL — ABNORMAL LOW (ref 80.0–100.0)
NRBC: 0 % (ref 0.0–0.2)
Platelets: 385 10*3/uL (ref 150–400)
RBC: 3.49 MIL/uL — AB (ref 3.87–5.11)
RDW: 17.4 % — ABNORMAL HIGH (ref 11.5–15.5)
WBC: 29.7 10*3/uL — ABNORMAL HIGH (ref 4.0–10.5)

## 2018-02-19 LAB — GASTROINTESTINAL PANEL BY PCR, STOOL (REPLACES STOOL CULTURE)

## 2018-02-19 LAB — RETICULOCYTES
Immature Retic Fract: 19.7 % — ABNORMAL HIGH (ref 2.3–15.9)
RBC.: 3.42 MIL/uL — ABNORMAL LOW (ref 3.87–5.11)
Retic Count, Absolute: 37.3 10*3/uL (ref 19.0–186.0)
Retic Ct Pct: 1.1 % (ref 0.4–3.1)

## 2018-02-19 LAB — IRON AND TIBC
Iron: <5 ug/dL — ABNORMAL LOW (ref 28–170)
TIBC: 294 ug/dL (ref 250–450)

## 2018-02-19 LAB — BASIC METABOLIC PANEL
Anion gap: 9 (ref 5–15)
BUN: 27 mg/dL — ABNORMAL HIGH (ref 8–23)
CO2: 21 mmol/L — ABNORMAL LOW (ref 22–32)
Calcium: 7.6 mg/dL — ABNORMAL LOW (ref 8.9–10.3)
Chloride: 105 mmol/L (ref 98–111)
Creatinine, Ser: 1.26 mg/dL — ABNORMAL HIGH (ref 0.44–1.00)
GFR calc Af Amer: 48 mL/min — ABNORMAL LOW (ref >60)
GFR calc non Af Amer: 41 mL/min — ABNORMAL LOW (ref >60)
Glucose, Bld: 204 mg/dL — ABNORMAL HIGH (ref 70–99)
Potassium: 3.3 mmol/L — ABNORMAL LOW (ref 3.5–5.1)
Sodium: 135 mmol/L (ref 135–145)

## 2018-02-19 LAB — FOLATE: FOLATE: 6.8 ng/mL (ref >5.9)

## 2018-02-19 LAB — GLUCOSE, CAPILLARY
GLUCOSE-CAPILLARY: 147 mg/dL — AB (ref 70–99)
Glucose-Capillary: 237 mg/dL — ABNORMAL HIGH (ref 70–99)
Glucose-Capillary: 170 mg/dL — ABNORMAL HIGH (ref 70–99)
Glucose-Capillary: 167 mg/dL — ABNORMAL HIGH (ref 70–99)

## 2018-02-19 LAB — MAGNESIUM: MAGNESIUM: 2.3 mg/dL (ref 1.7–2.4)

## 2018-02-19 LAB — LACTIC ACID, PLASMA
Lactic Acid, Venous: 3.2 mmol/L (ref 0.5–1.9)
Lactic Acid, Venous: 1.8 mmol/L (ref 0.5–1.9)

## 2018-02-19 LAB — C DIFFICILE QUICK SCREEN W PCR REFLEX
C Diff antigen: NEGATIVE
C Diff interpretation: NOT DETECTED
C Diff toxin: NEGATIVE

## 2018-02-19 LAB — FERRITIN: Ferritin: 61 ng/mL (ref 11–307)

## 2018-02-19 MED ORDER — SODIUM CHLORIDE 0.9 % IV SOLN
1.0000 g | Freq: Two times a day (BID) | INTRAVENOUS | Status: DC
  Administered 2018-02-19 (×2): 1 g via INTRAVENOUS
  Filled 2018-02-19 (×5): qty 1

## 2018-02-19 MED ORDER — FUROSEMIDE 10 MG/ML IJ SOLN
20.0000 mg | INTRAMUSCULAR | Status: AC
  Administered 2018-02-19: 18:00:00 20 mg via INTRAVENOUS
  Filled 2018-02-19: qty 2

## 2018-02-19 MED ORDER — FUROSEMIDE 10 MG/ML IJ SOLN
40.0000 mg | Freq: Once | INTRAMUSCULAR | Status: AC
  Administered 2018-02-19: 04:00:00 40 mg via INTRAVENOUS
  Filled 2018-02-19: qty 4

## 2018-02-19 MED ORDER — POTASSIUM CHLORIDE IN NACL 40-0.9 MEQ/L-% IV SOLN
INTRAVENOUS | Status: DC
  Administered 2018-02-19 (×2): 75 mL/h via INTRAVENOUS
  Filled 2018-02-19 (×5): qty 1000

## 2018-02-19 MED ORDER — SODIUM CHLORIDE 0.9 % IV SOLN
200.0000 mg | Freq: Once | INTRAVENOUS | Status: AC
  Administered 2018-02-19: 200 mg via INTRAVENOUS
  Filled 2018-02-19: qty 10

## 2018-02-19 MED ORDER — ENOXAPARIN SODIUM 40 MG/0.4ML ~~LOC~~ SOLN
40.0000 mg | SUBCUTANEOUS | Status: DC
  Administered 2018-02-19 – 2018-02-21 (×3): 40 mg via SUBCUTANEOUS
  Filled 2018-02-19 (×3): qty 0.4

## 2018-02-19 MED ORDER — SODIUM CHLORIDE 0.9 % IV BOLUS
1000.0000 mL | Freq: Once | INTRAVENOUS | Status: AC
  Administered 2018-02-19: 01:00:00 1000 mL via INTRAVENOUS

## 2018-02-19 MED ORDER — GUAIFENESIN-DM 100-10 MG/5ML PO SYRP
5.0000 mL | ORAL_SOLUTION | ORAL | Status: DC | PRN
  Filled 2018-02-19: qty 5

## 2018-02-19 MED ORDER — ORAL CARE MOUTH RINSE
15.0000 mL | Freq: Two times a day (BID) | OROMUCOSAL | Status: DC
  Administered 2018-02-19 – 2018-02-22 (×6): 15 mL via OROMUCOSAL

## 2018-02-19 NOTE — Progress Notes (Signed)
[facility] [place] NAME: April Pittman    MR#:  [id number]  DATE OF BIRTH:  07-10-1940  SUBJECTIVE:   Patient admitted due to abdominal pain nausea, weakness and suspected to have sepsis secondary to UTI/pyelonephritis.  Patient's sister is at bedside, patient still complaining of malaise, fatigue poor p.o. intake and some vague abdominal pain.  REVIEW OF SYSTEMS:    Review of Systems  Constitutional: Positive for malaise/fatigue. Negative for chills and fever.  HENT: Negative for congestion and tinnitus.   Eyes: Negative for blurred vision and double vision.  Respiratory: Negative for cough, shortness of breath and wheezing.   Cardiovascular: Negative for chest pain, orthopnea and PND.  Gastrointestinal: Positive for abdominal pain and nausea. Negative for diarrhea and vomiting.  Genitourinary: Negative for dysuria and hematuria.  Neurological: Positive for weakness (generalized. ). Negative for dizziness, sensory change and focal weakness.  All other systems reviewed and are negative.   Nutrition: Full liquids Tolerating Diet: yes but little Tolerating PT: Await Eval.    DRUG ALLERGIES:  No Known Allergies  VITALS:  Blood pressure (!) 116/42, pulse 93, temperature 98.7 F (37.1 C), temperature source Oral, resp. rate 20, height 5' 4.5" (1.638 m), weight 66.9 kg, SpO2 94 %.  PHYSICAL EXAMINATION:   Physical Exam  GENERAL:  77 y.o.-year-old patient lying in bed lethargic but in NAD.   EYES: Pupils equal, round, reactive to light and accommodation. No scleral icterus. Extraocular muscles intact.  HEENT: Head atraumatic, normocephalic. Oropharynx and nasopharynx clear.  NECK:  Supple, no jugular venous distention. No thyroid enlargement, no tenderness.  LUNGS: Normal breath sounds bilaterally, no wheezing, rales, rhonchi. No use of accessory muscles of respiration.  CARDIOVASCULAR: S1, S2 normal. No murmurs, rubs, or  gallops.  ABDOMEN: Soft, nontender, nondistended. Bowel sounds present. No organomegaly or mass.  EXTREMITIES: No cyanosis, clubbing or edema b/l.    NEUROLOGIC: Cranial nerves II through XII are intact. No focal Motor or sensory deficits b/l. Globally weak  PSYCHIATRIC: The patient is alert and oriented x 3.  SKIN: No obvious rash, lesion, or ulcer.    LABORATORY PANEL:   CBC Recent Labs  Lab 02/19/18 0443  WBC 29.7*  HGB 7.3*  HCT 24.7*  PLT 385   ------------------------------------------------------------------------------------------------------------------  Chemistries  Recent Labs  Lab 02/18/18 1032  02/19/18 0443  NA 132*  --  135  K 2.9*  --  3.3*  CL 95*  --  105  CO2 23  --  21*  GLUCOSE 255*  --  204*  BUN 24*  --  27*  CREATININE 1.42*  --  1.26*  CALCIUM 8.2*  --  7.6*  MG  --    < > 2.3  AST 28  --   --   ALT 11  --   --   ALKPHOS 99  --   --   BILITOT 0.9  --   --    < > = values in this interval not displayed.   ------------------------------------------------------------------------------------------------------------------  Cardiac Enzymes No results for input(s): TROPONINI in the last 168 hours. ------------------------------------------------------------------------------------------------------------------  RADIOLOGY:  Ct Abdomen Pelvis Wo Contrast  Result Date: 02/18/2018 CLINICAL DATA:  Abdominal pain with nausea, vomiting, and diarrhea. EXAM: CT ABDOMEN AND PELVIS WITHOUT CONTRAST TECHNIQUE: Multidetector CT imaging of the abdomen and pelvis was performed following the standard protocol without IV contrast. COMPARISON:  None. FINDINGS: Lower chest: Moderate hiatal hernia. Heart size is normal. Hepatobiliary:  Slight hepatomegaly. Increased density in the dependent portion of the gallbladder consistent with gallstones. Gallbladder wall is not thickened. No dilated bile ducts. Pancreas: Unremarkable. No pancreatic ductal dilatation or  surrounding inflammatory changes. Spleen: Normal in size without focal abnormality. Adrenals/Urinary Tract: Adrenal glands are normal. There is slight soft tissue stranding in the perinephric space bilaterally, left more than right. There is a tiny amount of fluid in the right pericolic gutter. No stones or hydronephrosis. No visible renal masses. Stomach/Bowel: Moderate hiatal hernia. Scattered diverticula in the distal colon. The bowel otherwise appears normal. Vascular/Lymphatic: Aortic atherosclerosis. No enlarged abdominal or pelvic lymph nodes. Reproductive: Uterus and bilateral adnexa are unremarkable. Pessary in place. Other: No ascites in the pelvis. No hernias. Musculoskeletal: No acute or significant osseous findings. IMPRESSION: 1. Slight soft tissue stranding in the perinephric space bilaterally with a tiny amount of fluid in the right pericolic gutter. The possibility of pyelonephritis should be considered. 2. Cholelithiasis. 3. Moderate hiatal hernia. Aortic Atherosclerosis (ICD10-I70.0). Electronically Signed   By: Lorriane Shire M.D.   On: 02/18/2018 11:39   Dg Chest 2 View  Result Date: 02/18/2018 CLINICAL DATA:  Fever. Nausea, vomiting, and diarrhea. EXAM: CHEST - 2 VIEW COMPARISON:  04/01/2008 FINDINGS: The heart size is normal. There is slight pulmonary vascular prominence. Slight atelectasis at the left lung base. No consolidative infiltrates or effusions. No acute bone abnormality. IMPRESSION: Slight pulmonary vascular prominence with slight atelectasis at the left lung base. Electronically Signed   By: Lorriane Shire M.D.   On: 02/18/2018 11:32   Dg Chest Port 1 View  Result Date: 02/19/2018 CLINICAL DATA:  Acute onset of shortness of breath and cough. EXAM: PORTABLE CHEST 1 VIEW COMPARISON:  Chest radiograph performed 02/18/2018 FINDINGS: Increased interstitial markings are noted bilaterally, with mild left basilar opacity. This may reflect mild interstitial edema. No definite  pleural effusion or pneumothorax is seen. The cardiomediastinal silhouette is mildly enlarged. No acute osseous abnormalities IMPRESSION: Increased interstitial markings, with mild left basilar airspace opacity. This may reflect mild interstitial edema. Mild cardiomegaly noted. Electronically Signed   By: Garald Balding M.D.   On: 02/19/2018 03:03     ASSESSMENT AND PLAN:   77 year old female with past medical history of previous TIA, osteoporosis, hypertension, hypothyroidism, hyperlipidemia, GERD, diabetes, history of coronary artery disease who presented to the hospital due to abdominal pain, weakness, lethargy and noted to have sepsis secondary to UTI.  1.  Sepsis-patient meets criteria given her leukocytosis, elevated lactic acid and urinalysis positive for UTI. - Continue IV fluids, anti-diuretics.  Patient was on IV ceftriaxone but will broaden coverage to IV meropenem for now.  Patient's blood cultures are positive for E. coli but sensitivities pending.  2.  Urinary tract infection-source of patient's sepsis.  Change antibiotics to IV meropenem as mentioned above. -Follow urine cultures.  CT abdomen pelvis showing no evidence of nephrolithiasis.  3.  Abdominal pain/nausea-secondary to UTI/pyelonephritis.  Continue supportive care with IV fluids, antiemetics for now.  CT abdomen pelvis showing no acute surgical pathology.   4.  Essential hypertension-continue Norvasc, metoprolol.  5.  Anemia- patient's iron studies are consistent with iron deficiency. -Await Hemoccult.  Will give 1 dose of IV iron.  6.  Hypokalemia-improving with supplementation will continue to monitor.  7.  GERD-continue Protonix.  8.  Hyperlipidemia-continue simvastatin.  9.  Hypothyroidism-continue Synthroid.     All the records are reviewed and case discussed with Care Management/Social Worker. Management plans discussed with the patient, family and  they are in agreement.  CODE STATUS: Full code  DVT  Prophylaxis: Lovenox  TOTAL TIME TAKING CARE OF THIS PATIENT: 30 minutes.   POSSIBLE D/C IN 2-3 DAYS, DEPENDING ON CLINICAL CONDITION.   Henreitta Leber M.D on 02/19/2018 at 12:50 PM  Between 7am to 6pm - Pager - [phone]  After 6pm go to [email or web address] - Proofreader  [facility]  Office  [phone]  CC: Primary care physician; Birdie Sons, MD

## 2018-02-19 NOTE — Progress Notes (Signed)
PHARMACIST - PHYSICIAN COMMUNICATION  CONCERNING:  Enoxaparin (Lovenox) for DVT Prophylaxis    RECOMMENDATION: Patient was prescribed enoxaprin 30mg  q24 hours for VTE prophylaxis.   Filed Weights   02/18/18 1013 02/18/18 1422  Weight: 135 lb (61.2 kg) 147 lb 6.4 oz (66.9 kg)    Body mass index is 24.91 kg/m.  Estimated Creatinine Clearance: 33 mL/min (A) (by C-G formula based on SCr of 1.26 mg/dL (H)).   Patient's CrCl has improved from admission. Based on [place] patient is candidate for enoxaparin 40mg  every 24 hour dosing for    DESCRIPTION: Pharmacy has adjusted enoxaparin dose.  Patient is now receiving enoxaparin 40mg  every 24 hours.   Pernell Dupre, PharmD, BCPS Clinical Pharmacist 02/19/2018 8:05 AM

## 2018-02-19 NOTE — Progress Notes (Signed)
PT Cancellation Note  Patient Details Name: AFSA MEANY MRN: [id number] DOB: [birth year]   Cancelled Treatment:    Reason Eval/Treat Not Completed: Medical issues which prohibited therapy Attempted to see pt but she was feeling very nauseated, holding emesis bag and though wanting to work with PT could not manage even sitting up because of feeling so poorly.  Will try back later as time allows as pt wanted to work.  Kreg Shropshire, DPT 02/19/2018, 12:26 PM

## 2018-02-19 NOTE — Evaluation (Signed)
Physical Therapy Evaluation Patient Details Name: April Pittman MRN: [id number] DOB: Jul 03, 1940 Today's Date: 02/19/2018   History of Present Illness  77 y.o. female presenting with not feeling good for the past few days.  She complains of some burning in the stomach, nausea, diarrhea and vomiting.  Clinical Impression  Pt did well with PT exam and though she was still feeling weak and somewhat nauseated she was able to do some mobility and in-room ambulation with good overall performance.  She is not at her baseline and would benefit from HHPT once medically ready for discharge.      Follow Up Recommendations Home health PT    Equipment Recommendations  None recommended by PT    Recommendations for Other Services       Precautions / Restrictions Precautions Precautions: None Restrictions Weight Bearing Restrictions: No      Mobility  Bed Mobility Overal bed mobility: Independent             General bed mobility comments: Pt able to get to sitting EOB w/o physical assist, initially hesitant secondary to not feeling well but no issues  Transfers Overall transfer level: Independent Equipment used: None             General transfer comment: Pt was able to rise to standing w/o assist, no unsteadiness   Ambulation/Gait Ambulation/Gait assistance: Min guard Gait Distance (Feet): 35 Feet Assistive device: None       General Gait Details: Pt with some unsteadiness and wobble during limited ambulation but generally did not need any direct assist to maintain balance.    Stairs            Wheelchair Mobility    Modified Rankin (Stroke Patients Only)       Balance Overall balance assessment: Modified Independent                                           Pertinent Vitals/Pain Pain Assessment: No/denies pain    Home Living Family/patient expects to be discharged to:: Private residence Living Arrangements: Spouse/significant  other Available Help at Discharge: Family   Home Access: Stairs to enter       Home Equipment: Environmental consultant - 2 wheels;Cane - single point(does not use)      Prior Function Level of Independence: Independent         Comments: does not drive 2/2 vision issues, but is generally able to be active and out of the home     Hand Dominance        Extremity/Trunk Assessment   Upper Extremity Assessment Upper Extremity Assessment: Overall WFL for tasks assessed    Lower Extremity Assessment Lower Extremity Assessment: Overall WFL for tasks assessed       Communication   Communication: No difficulties  Cognition Arousal/Alertness: Awake/alert Behavior During Therapy: WFL for tasks assessed/performed Overall Cognitive Status: Within Functional Limits for tasks assessed                                        General Comments      Exercises     Assessment/Plan    PT Assessment Patient needs continued PT services  PT Problem List Decreased strength;Decreased activity tolerance;Decreased balance       PT Treatment Interventions Gait training;Functional mobility  training;Therapeutic activities;Therapeutic exercise;Balance training;Patient/family education    PT Goals (Current goals can be found in the Care Plan section)  Acute Rehab PT Goals Patient Stated Goal: get feeling better and go home PT Goal Formulation: With patient Time For Goal Achievement: 03/05/18 Potential to Achieve Goals: Good    Frequency Min 2X/week   Barriers to discharge        Co-evaluation               AM-PAC PT "6 Clicks" Mobility  Outcome Measure Help needed turning from your back to your side while in a flat bed without using bedrails?: None Help needed moving from lying on your back to sitting on the side of a flat bed without using bedrails?: None Help needed moving to and from a bed to a chair (including a wheelchair)?: None Help needed standing up from a chair  using your arms (e.g., wheelchair or bedside chair)?: None Help needed to walk in hospital room?: None Help needed climbing 3-5 steps with a railing? : None 6 Click Score: 24    End of Session Equipment Utilized During Treatment: Gait belt Activity Tolerance: Patient tolerated treatment well Patient left: with chair alarm set;with call bell/phone within reach;with family/visitor present Nurse Communication: Mobility status PT Visit Diagnosis: Unsteadiness on feet (R26.81);Muscle weakness (generalized) (M62.81)    Time: 6122-4497 PT Time Calculation (min) (ACUTE ONLY): 23 min   Charges:   PT Evaluation $PT Eval Low Complexity: 1 Low          Kreg Shropshire, DPT 02/19/2018, 4:58 PM

## 2018-02-19 NOTE — Progress Notes (Signed)
Patient on 6L [place] stating at 90-95% with HOB >30

## 2018-02-19 NOTE — Progress Notes (Signed)
Patient coughing frequently, shivering, VS: BP:156/68, RR:24, Temp:100.3, O2- 85% (ra). MD Sainani made aware, stat chest xray ordered. Patient placed on 6L NS with no O2 change, placed on a non-rebreather stated increased to 99%, HOD >30%.

## 2018-02-20 LAB — BASIC METABOLIC PANEL
Anion gap: 10 (ref 5–15)
BUN: 28 mg/dL — ABNORMAL HIGH (ref 8–23)
CO2: 23 mmol/L (ref 22–32)
Calcium: 8.2 mg/dL — ABNORMAL LOW (ref 8.9–10.3)
Chloride: 104 mmol/L (ref 98–111)
Creatinine, Ser: 1.29 mg/dL — ABNORMAL HIGH (ref 0.44–1.00)
GFR calc Af Amer: 46 mL/min — ABNORMAL LOW (ref >60)
GFR calc non Af Amer: 40 mL/min — ABNORMAL LOW (ref >60)
Glucose, Bld: 150 mg/dL — ABNORMAL HIGH (ref 70–99)
Potassium: 3.3 mmol/L — ABNORMAL LOW (ref 3.5–5.1)
Sodium: 137 mmol/L (ref 135–145)

## 2018-02-20 LAB — CBC
HCT: 28.8 % — ABNORMAL LOW (ref 36.0–46.0)
Hemoglobin: 8.4 g/dL — ABNORMAL LOW (ref 12.0–15.0)
MCH: 20.2 pg — ABNORMAL LOW (ref 26.0–34.0)
MCHC: 29.2 g/dL — ABNORMAL LOW (ref 30.0–36.0)
MCV: 69.4 fL — ABNORMAL LOW (ref 80.0–100.0)
Platelets: 391 10*3/uL (ref 150–400)
RBC: 4.15 MIL/uL (ref 3.87–5.11)
RDW: 17.5 % — ABNORMAL HIGH (ref 11.5–15.5)
WBC: 24.2 10*3/uL — ABNORMAL HIGH (ref 4.0–10.5)
nRBC: 0 % (ref 0.0–0.2)

## 2018-02-20 LAB — GLUCOSE, CAPILLARY
GLUCOSE-CAPILLARY: 137 mg/dL — AB (ref 70–99)
Glucose-Capillary: 175 mg/dL — ABNORMAL HIGH (ref 70–99)
Glucose-Capillary: 149 mg/dL — ABNORMAL HIGH (ref 70–99)
Glucose-Capillary: 138 mg/dL — ABNORMAL HIGH (ref 70–99)

## 2018-02-20 LAB — URINE CULTURE: Culture: >=100000 — AB

## 2018-02-20 MED ORDER — CEFAZOLIN SODIUM-DEXTROSE 1-4 GM/50ML-% IV SOLN
1.0000 g | Freq: Two times a day (BID) | INTRAVENOUS | Status: DC
  Administered 2018-02-20 (×2): 1 g via INTRAVENOUS
  Filled 2018-02-20 (×4): qty 50

## 2018-02-20 MED ORDER — POTASSIUM CHLORIDE CRYS ER 20 MEQ PO TBCR
40.0000 meq | EXTENDED_RELEASE_TABLET | Freq: Once | ORAL | Status: AC
  Administered 2018-02-20: 40 meq via ORAL
  Filled 2018-02-20: qty 2

## 2018-02-20 NOTE — Progress Notes (Signed)
[place] at [place] NAME: Karoline Fleer    MR#:  [id number]  DATE OF BIRTH:  Mar 23, 1940  SUBJECTIVE:   Patient developed some respiratory distress yesterday and was noted to have chest x-ray findings suggestive of pulmonary edema and given some IV Lasix.  Still remains on 5 L nasal cannula.  Family is at bedside.  Patient is tolerating her full liquid diet without any nausea vomiting and wants me to advance her diet.  Still having some low-grade fever but leukocytosis has improved.  REVIEW OF SYSTEMS:    Review of Systems  Constitutional: Positive for malaise/fatigue. Negative for chills and fever.  HENT: Negative for congestion and tinnitus.   Eyes: Negative for blurred vision and double vision.  Respiratory: Negative for cough, shortness of breath and wheezing.   Cardiovascular: Negative for chest pain, orthopnea and PND.  Gastrointestinal: Positive for abdominal pain. Negative for diarrhea, nausea and vomiting.  Genitourinary: Negative for dysuria and hematuria.  Neurological: Positive for weakness (generalized. ). Negative for dizziness, sensory change and focal weakness.  All other systems reviewed and are negative.   Nutrition: Full liquids Tolerating Diet: yes but little Tolerating PT: Await Eval.    DRUG ALLERGIES:  No Known Allergies  VITALS:  Blood pressure (!) 154/68, pulse 99, temperature 100.2 F (37.9 C), temperature source Oral, resp. rate 20, height 5' 4.5" (1.638 m), weight 66.9 kg, SpO2 91 %.  PHYSICAL EXAMINATION:   Physical Exam  GENERAL:  77 y.o.-year-old patient lying in bed in NAD.   EYES: Pupils equal, round, reactive to light and accommodation. No scleral icterus. Extraocular muscles intact.  HEENT: Head atraumatic, normocephalic. Oropharynx and nasopharynx clear.  NECK:  Supple, no jugular venous distention. No thyroid enlargement, no tenderness.  LUNGS: Normal breath sounds bilaterally, no  wheezing, rales, rhonchi. No use of accessory muscles of respiration.  CARDIOVASCULAR: S1, S2 normal. No murmurs, rubs, or gallops.  ABDOMEN: Soft, nontender, nondistended. Bowel sounds present. No organomegaly or mass.  EXTREMITIES: No cyanosis, clubbing or edema b/l.    NEUROLOGIC: Cranial nerves II through XII are intact. No focal Motor or sensory deficits b/l. Globally weak  PSYCHIATRIC: The patient is alert and oriented x 3.  SKIN: No obvious rash, lesion, or ulcer.    LABORATORY PANEL:   CBC Recent Labs  Lab 02/20/18 0440  WBC 24.2*  HGB 8.4*  HCT 28.8*  PLT 391   ------------------------------------------------------------------------------------------------------------------  Chemistries  Recent Labs  Lab 02/18/18 1032  02/19/18 0443 02/20/18 0440  NA 132*  --  135 137  K 2.9*  --  3.3* 3.3*  CL 95*  --  105 104  CO2 23  --  21* 23  GLUCOSE 255*  --  204* 150*  BUN 24*  --  27* 28*  CREATININE 1.42*  --  1.26* 1.29*  CALCIUM 8.2*  --  7.6* 8.2*  MG  --    < > 2.3  --   AST 28  --   --   --   ALT 11  --   --   --   ALKPHOS 99  --   --   --   BILITOT 0.9  --   --   --    < > = values in this interval not displayed.   ------------------------------------------------------------------------------------------------------------------  Cardiac Enzymes No results for input(s): TROPONINI in the last 168 hours. ------------------------------------------------------------------------------------------------------------------  RADIOLOGY:  Dg Chest 1 View  Result Date:  02/19/2018 CLINICAL DATA:  77 year old undergoing fluid resuscitation for sepsis, now with acute onset of cough and hypoxemia requiring oxygen therapy. EXAM: Portable CHEST 1 VIEW 4:56 p.m.: COMPARISON:  Chest x-ray earlier same day 2:48 a.m. and previously. FINDINGS: Since the examination earlier today, the patient has developed moderate diffuse interstitial pulmonary edema. Cardiac silhouette  moderately enlarged. No confluent airspace consolidation. Possible small BILATERAL pleural effusions. IMPRESSION: Moderate acute CHF and/or fluid overload, with moderate cardiomegaly and moderate diffuse interstitial pulmonary edema. Electronically Signed   By: Evangeline Dakin M.D.   On: 02/19/2018 17:29   Dg Chest Port 1 View  Result Date: 02/19/2018 CLINICAL DATA:  Acute onset of shortness of breath and cough. EXAM: PORTABLE CHEST 1 VIEW COMPARISON:  Chest radiograph performed 02/18/2018 FINDINGS: Increased interstitial markings are noted bilaterally, with mild left basilar opacity. This may reflect mild interstitial edema. No definite pleural effusion or pneumothorax is seen. The cardiomediastinal silhouette is mildly enlarged. No acute osseous abnormalities IMPRESSION: Increased interstitial markings, with mild left basilar airspace opacity. This may reflect mild interstitial edema. Mild cardiomegaly noted. Electronically Signed   By: Garald Balding M.D.   On: 02/19/2018 03:03     ASSESSMENT AND PLAN:   77 year old female with past medical history of previous TIA, osteoporosis, hypertension, hypothyroidism, hyperlipidemia, GERD, diabetes, history of coronary artery disease who presented to the hospital due to abdominal pain, weakness, lethargy and noted to have sepsis secondary to UTI.  1.  Sepsis-patient meets criteria given her leukocytosis, elevated lactic acid and urinalysis positive for UTI. -Off IV fluids as patient became somewhat volume overloaded yesterday.   -Urine cultures are positive for E. coli and it is fairly pansensitive.  Change IV antibiotics from meropenem to Ancef for now.  Await sensitivities on the blood cultures but patient is clinically improving.  2.  Urinary tract infection-source of patient's sepsis.   -Urine cultures positive for E. coli which is fairly pansensitive.  Change antibiotics from meropenem to IV Ancef. -CT abdomen pelvis showing no evidence of  nephrolithiasis.  3.  Acute respiratory failure with hypoxia-secondary to volume overload/pulmonary edema.  Patient's chest x-ray yesterday showed interstitial marking markings consistent with pulmonary edema.  Given IV Lasix and responded well to it. - Remains on 5 L nasal cannula.  Will check echocardiogram today.  Off IV fluids for now.  Hold off on further diuretics presently.  4.  Abdominal pain/nausea-secondary to UTI/pyelonephritis.   - improving and pt. Has no N/V today. Will advance diet.  Cont. IV abx as mentioned above.   5.  Essential hypertension-continue Norvasc, metoprolol.  6.  Anemia- patient's iron studies are consistent with iron deficiency. - given IV Iron and Hg. Has improved.  Await Hemoccult.   7.  Hypokalemia-cont. To supplement and will repeat in a.m.   8.  GERD-continue Protonix.  9.  Hyperlipidemia-continue simvastatin.  10.  Hypothyroidism-continue Synthroid.  PT recommending Home Health and will arrange upon discharge.   All the records are reviewed and case discussed with Care Management/Social Worker. Management plans discussed with the patient, family and they are in agreement.  CODE STATUS: Full code  DVT Prophylaxis: Lovenox  TOTAL TIME TAKING CARE OF THIS PATIENT: 30 minutes.   POSSIBLE D/C IN 2-3 DAYS, DEPENDING ON CLINICAL CONDITION.   Henreitta Leber M.D on 02/20/2018 at 12:37 PM  Between 7am to 6pm - Pager - [phone]  After 6pm go to [email or web address] - [employer] [facility]  Office  [phone]  CC:  Primary care physician; Birdie Sons, MD

## 2018-02-20 NOTE — Care Management Note (Signed)
Case Management Note  Patient Details  Name: April Pittman MRN: [id number] Date of Birth: January 12, 1941  Subjective/Objective:  Patient admitted with Sepsis, family is at the bedside.  Patient reports that she is from home with husband.  Patient is not engaging with RNCM.  RNCM asked if patient would prefer to talk at a later time and she responds yes.  Choice offered for home health PT, list is left with family in the room, no choice has been made at this time and patient will not say if she agrees to [facility] or not. Doran Clay RN BSN [phone]                   Action/Plan:   Expected Discharge Date:  02/20/18               Expected Discharge Plan:     In-House Referral:     Discharge planning Services  CM Consult  Post Acute Care Choice:    Choice offered to:     DME Arranged:    DME Agency:     HH Arranged:    HH Agency:     Status of Service:  In process, will continue to follow  If discussed at Long Length of Stay Meetings, dates discussed:    Additional Comments:  Shelbie Hutching, RN 02/20/2018, 1:49 PM

## 2018-02-21 ENCOUNTER — Inpatient Hospital Stay: Payer: PPO

## 2018-02-21 ENCOUNTER — Inpatient Hospital Stay
Admit: 2018-02-21 | Discharge: 2018-02-21 | Disposition: A | Discharge status: Home / Self Care | Payer: PPO | Attending: Specialist | Admitting: Specialist

## 2018-02-21 LAB — CBC
HCT: 23.5 % — ABNORMAL LOW (ref 36.0–46.0)
Hemoglobin: 6.9 g/dL — ABNORMAL LOW (ref 12.0–15.0)
MCH: 20.4 pg — AB (ref 26.0–34.0)
MCHC: 29.4 g/dL — ABNORMAL LOW (ref 30.0–36.0)
MCV: 69.3 fL — ABNORMAL LOW (ref 80.0–100.0)
Platelets: 352 10*3/uL (ref 150–400)
RBC: 3.39 MIL/uL — ABNORMAL LOW (ref 3.87–5.11)
RDW: 17.9 % — ABNORMAL HIGH (ref 11.5–15.5)
WBC: 13 10*3/uL — ABNORMAL HIGH (ref 4.0–10.5)
nRBC: 0 % (ref 0.0–0.2)

## 2018-02-21 LAB — CULTURE, BLOOD (ROUTINE X 2)
Special Requests: ADEQUATE
Special Requests: ADEQUATE

## 2018-02-21 LAB — GLUCOSE, CAPILLARY
Glucose-Capillary: 142 mg/dL — ABNORMAL HIGH (ref 70–99)
Glucose-Capillary: 180 mg/dL — ABNORMAL HIGH (ref 70–99)
Glucose-Capillary: 133 mg/dL — ABNORMAL HIGH (ref 70–99)
Glucose-Capillary: 252 mg/dL — ABNORMAL HIGH (ref 70–99)

## 2018-02-21 LAB — BASIC METABOLIC PANEL
Anion gap: 6 (ref 5–15)
BUN: 30 mg/dL — AB (ref 8–23)
CO2: 26 mmol/L (ref 22–32)
Calcium: 8.2 mg/dL — ABNORMAL LOW (ref 8.9–10.3)
Chloride: 104 mmol/L (ref 98–111)
Creatinine, Ser: 1.22 mg/dL — ABNORMAL HIGH (ref 0.44–1.00)
GFR calc Af Amer: 49 mL/min — ABNORMAL LOW (ref >60)
GFR calc non Af Amer: 43 mL/min — ABNORMAL LOW (ref >60)
Glucose, Bld: 178 mg/dL — ABNORMAL HIGH (ref 70–99)
Potassium: 4 mmol/L (ref 3.5–5.1)
Sodium: 136 mmol/L (ref 135–145)

## 2018-02-21 LAB — ABO/RH: ABO/RH(D): O POS

## 2018-02-21 LAB — PREPARE RBC (CROSSMATCH)

## 2018-02-21 MED ORDER — SODIUM CHLORIDE 0.9% IV SOLUTION
Freq: Once | INTRAVENOUS | Status: AC
  Administered 2018-02-21: 16:00:00 via INTRAVENOUS

## 2018-02-21 MED ORDER — SODIUM CHLORIDE 0.9 % IV SOLN
2.0000 g | INTRAVENOUS | Status: DC
  Administered 2018-02-21: 2 g via INTRAVENOUS
  Filled 2018-02-21: qty 2
  Filled 2018-02-21: qty 20

## 2018-02-21 MED ORDER — SALINE SPRAY 0.65 % NA SOLN
1.0000 | NASAL | Status: DC | PRN
  Filled 2018-02-21: qty 44

## 2018-02-21 NOTE — Progress Notes (Signed)
PT Cancellation Note  Patient Details Name: April Pittman MRN: [id number] DOB: 07-21-1940   Cancelled Treatment:    Reason Eval/Treat Not Completed: Medical issues which prohibited therapy. Hgb 6.9; blood transfusion ordered, but not yet started. Hold until complete. Re attempt tomorrow pending lab results.    Larae Grooms, PTA 02/21/2018, 1:32 PM

## 2018-02-21 NOTE — Plan of Care (Signed)
Wbc trending down 13.0. / hgb 6.9 received 1 unit prbcs today tol well

## 2018-02-21 NOTE — Progress Notes (Signed)
PT Cancellation Note  Patient Details Name: April Pittman MRN: [id number] DOB: [birth year]   Cancelled Treatment:    Reason Eval/Treat Not Completed: Patient not medically ready   HgB noted to be 6.9.  Pt stated she is awaiting blood transfusion.  Will hold session this am and continue as appropriate.   Chesley Noon 02/21/2018, 11:21 AM

## 2018-02-21 NOTE — Progress Notes (Signed)
[place] at [place] NAME: April Pittman    MR#:  [id number]  DATE OF BIRTH:  [birth year]  SUBJECTIVE:   Still complaining of some vague abdominal pain.  Hemoglobin down to 6.9 this morning.  No acute bleeding noted.  White cell count has improved.  Patient is afebrile this morning.  REVIEW OF SYSTEMS:    Review of Systems  Constitutional: Positive for malaise/fatigue. Negative for chills and fever.  HENT: Negative for congestion and tinnitus.   Eyes: Negative for blurred vision and double vision.  Respiratory: Negative for cough, shortness of breath and wheezing.   Cardiovascular: Negative for chest pain, orthopnea and PND.  Gastrointestinal: Positive for abdominal pain. Negative for diarrhea, nausea and vomiting.  Genitourinary: Negative for dysuria and hematuria.  Neurological: Positive for weakness (generalized. ). Negative for dizziness, sensory change and focal weakness.  All other systems reviewed and are negative.   Nutrition: Full liquids Tolerating Diet: yes but little Tolerating PT: Await Eval.    DRUG ALLERGIES:  No Known Allergies  VITALS:  Blood pressure (!) 144/93, pulse 84, temperature 98.4 F (36.9 C), temperature source Oral, resp. rate 18, height 5' 4.5" (1.638 m), weight 66.9 kg, SpO2 98 %.  PHYSICAL EXAMINATION:   Physical Exam  GENERAL:  77 y.o.-year-old patient lying in bed in NAD.   EYES: Pupils equal, round, reactive to light and accommodation. No scleral icterus. Extraocular muscles intact. Pale conjunctiva HEENT: Head atraumatic, normocephalic. Oropharynx and nasopharynx clear.  NECK:  Supple, no jugular venous distention. No thyroid enlargement, no tenderness.  LUNGS: Normal breath sounds bilaterally, no wheezing, rales, rhonchi. No use of accessory muscles of respiration.  CARDIOVASCULAR: S1, S2 normal. No murmurs, rubs, or gallops.  ABDOMEN: Soft, nontender, nondistended. Bowel sounds present.  No organomegaly or mass.  EXTREMITIES: No cyanosis, clubbing or edema b/l.    NEUROLOGIC: Cranial nerves II through XII are intact. No focal Motor or sensory deficits b/l. Globally weak  PSYCHIATRIC: The patient is alert and oriented x 3.  SKIN: No obvious rash, lesion, or ulcer.    LABORATORY PANEL:   CBC Recent Labs  Lab 02/21/18 0509  WBC 13.0*  HGB 6.9*  HCT 23.5*  PLT 352   ------------------------------------------------------------------------------------------------------------------  Chemistries  Recent Labs  Lab 02/18/18 1032  02/19/18 0443  02/21/18 0509  NA 132*  --  135   < > 136  K 2.9*  --  3.3*   < > 4.0  CL 95*  --  105   < > 104  CO2 23  --  21*   < > 26  GLUCOSE 255*  --  204*   < > 178*  BUN 24*  --  27*   < > 30*  CREATININE 1.42*  --  1.26*   < > 1.22*  CALCIUM 8.2*  --  7.6*   < > 8.2*  MG  --    < > 2.3  --   --   AST 28  --   --   --   --   ALT 11  --   --   --   --   ALKPHOS 99  --   --   --   --   BILITOT 0.9  --   --   --   --    < > = values in this interval not displayed.   ------------------------------------------------------------------------------------------------------------------  Cardiac Enzymes No results for input(s): TROPONINI in  the last 168 hours. ------------------------------------------------------------------------------------------------------------------  RADIOLOGY:  Dg Chest 1 View  Result Date: 02/19/2018 CLINICAL DATA:  77 year old undergoing fluid resuscitation for sepsis, now with acute onset of cough and hypoxemia requiring oxygen therapy. EXAM: Portable CHEST 1 VIEW 4:56 p.m.: COMPARISON:  Chest x-ray earlier same day 2:48 a.m. and previously. FINDINGS: Since the examination earlier today, the patient has developed moderate diffuse interstitial pulmonary edema. Cardiac silhouette moderately enlarged. No confluent airspace consolidation. Possible small BILATERAL pleural effusions. IMPRESSION: Moderate acute CHF  and/or fluid overload, with moderate cardiomegaly and moderate diffuse interstitial pulmonary edema. Electronically Signed   By: Evangeline Dakin M.D.   On: 02/19/2018 17:29   Dg Abd 1 View  Result Date: 02/21/2018 CLINICAL DATA:  Abdominal distension EXAM: ABDOMEN - 1 VIEW COMPARISON:  CT abdomen and pelvis February 18, 2018 FINDINGS: There is no bowel dilatation or air-fluid level to suggest bowel obstruction. No free air. There is mild stool in the colon. Lung bases are clear. There is a pessary in the lower pelvis. IMPRESSION: No bowel obstruction or free air. Lung bases clear. Pessary in lower pelvis. Electronically Signed   By: Lowella Grip III M.D.   On: 02/21/2018 09:53     ASSESSMENT AND PLAN:   77 year old female with past medical history of previous TIA, osteoporosis, hypertension, hypothyroidism, hyperlipidemia, GERD, diabetes, history of coronary artery disease who presented to the hospital due to abdominal pain, weakness, lethargy and noted to have sepsis secondary to UTI.  1.  Sepsis-patient meets criteria given her leukocytosis, elevated lactic acid and urinalysis positive for UTI. -Urine and blood cultures are positive for E. coli which is fairly pansensitive.  Continue IV ceftriaxone for now - will Switch to oral antibiotics upon discharge and will need a total of 2 weeks of therapy including hospitalization.  2.  Urinary tract infection-source of patient's sepsis.   -Urine cultures positive for E. coli which is fairly pansensitive.  Change abx to IV ceftriaxone today.  -CT abdomen pelvis showing no evidence of nephrolithiasis.  3.  Acute respiratory failure with hypoxia-secondary to volume overload/pulmonary edema.  Patient's chest x-ray on 02/19/18 showed interstitial marking markings consistent with pulmonary edema.  Given IV Lasix and responded well to it. - weaned off O2 and now down to RA. Await Echo results.  Off IV fluids for now.    4.  Abdominal  pain/nausea-secondary to UTI/pyelonephritis.   - improving and pt. Has no N/V today. Abdominal X-ray this a.m. showing no ileus/obstruction.  - tolerating PO well and will cont. To monitor.   5.  Essential hypertension-continue Norvasc, metoprolol. - BP stable.   6.  Anemia- patient's iron studies are consistent with iron deficiency. -Hemoglobin down to 6.9 today.  Will transfuse 1 unit of packed red blood cells.  Await Hemoccult.  Patient has no acute bleeding. -Patient will likely need to be discharged on oral iron supplements, With outpatient hematology oncology follow-up.  7.  Hypokalemia- improved w/ supplementation and will cont. To monitor.    8.  GERD-continue Protonix.  9.  Hyperlipidemia-continue simvastatin.  10.  Hypothyroidism-continue Synthroid.  PT recommending Home Health and will arrange upon discharge.   All the records are reviewed and case discussed with Care Management/Social Worker. Management plans discussed with the patient, family and they are in agreement.  CODE STATUS: Full code  DVT Prophylaxis: Lovenox  TOTAL TIME TAKING CARE OF THIS PATIENT: 30 minutes.   POSSIBLE D/C IN 1-2 DAYS, DEPENDING ON CLINICAL CONDITION.   Henreitta Leber  M.D on 02/21/2018 at 1:21 PM  Between 7am to 6pm - Pager - [phone]  After 6pm go to [email or web address] - Proofreader  [facility] [place] [facility]  Office  [phone]  CC: Primary care physician; Birdie Sons, MD

## 2018-02-21 NOTE — Progress Notes (Signed)
*  PRELIMINARY RESULTS* Echocardiogram 2D Echocardiogram has been performed.  Sherrie Sport 02/21/2018, 10:13 AM

## 2018-02-21 NOTE — Care Management Note (Addendum)
Case Management Note  Patient Details  Name: April Pittman MRN: [id number] Date of Birth: January 05, 1941  Subjective/Objective:                 Patient's hemoglobin today 6.9 and currently receiving transfusion.  Order present for occult blood card x 3  placed 12/28.  Spoke with lab and have not received any specimens for this.  Patient says she has had at least 6 stools over the last day an a half.  Spoke with primary nurse regarding need to collect these specimens to rule out bleeding.  Family has verbalized concerns about anticipated discharge within the next 24 hours if bleeding has not been ruled out as a cause for low hemoglobin. Family has questions regarding skilled nursing facility placement.  CM discussed her health team advantage plan would have to approve skilled nursing stay and at present, have recommended home with home health.  PT was not able to eval today due to low hemoglobin.   Action/Plan:  Updated attending that hemoccult specimen collection opportunity has been missed since ordered. Provided Med Alert Brochure per request  Expected Discharge Date:  02/20/18               Expected Discharge Plan:     In-House Referral:     Discharge planning Services  CM Consult  Post Acute Care Choice:  Home Health Choice offered to:  Adult Children  DME Arranged:    DME Agency:     HH Arranged:  RN, PT, Nurse's Aide, Social Work [employer] Agency:  [place]  Status of Service:  In process, will continue to follow  If discussed at Long Length of Stay Meetings, dates discussed:    Additional Comments:  Katrina Stack, RN 02/21/2018, 5:01 PM

## 2018-02-22 LAB — ECHOCARDIOGRAM COMPLETE
Height: 64.5 in
WEIGHTICAEL: 2358.4 [oz_av]

## 2018-02-22 LAB — TYPE AND SCREEN
ABO/RH(D): O POS
Antibody Screen: NEGATIVE
Unit division: 0

## 2018-02-22 LAB — GLUCOSE, CAPILLARY
GLUCOSE-CAPILLARY: 135 mg/dL — AB (ref 70–99)
GLUCOSE-CAPILLARY: 129 mg/dL — AB (ref 70–99)

## 2018-02-22 LAB — BASIC METABOLIC PANEL
Anion gap: 8 (ref 5–15)
BUN: 26 mg/dL — AB (ref 8–23)
CO2: 24 mmol/L (ref 22–32)
Calcium: 8.2 mg/dL — ABNORMAL LOW (ref 8.9–10.3)
Chloride: 105 mmol/L (ref 98–111)
Creatinine, Ser: 1.16 mg/dL — ABNORMAL HIGH (ref 0.44–1.00)
GFR calc Af Amer: 53 mL/min — ABNORMAL LOW (ref >60)
GFR calc non Af Amer: 45 mL/min — ABNORMAL LOW (ref >60)
Glucose, Bld: 153 mg/dL — ABNORMAL HIGH (ref 70–99)
POTASSIUM: 3.8 mmol/L (ref 3.5–5.1)
Sodium: 137 mmol/L (ref 135–145)

## 2018-02-22 LAB — CBC
HCT: 27.7 % — ABNORMAL LOW (ref 36.0–46.0)
HEMOGLOBIN: 8.4 g/dL — AB (ref 12.0–15.0)
MCH: 21.2 pg — ABNORMAL LOW (ref 26.0–34.0)
MCHC: 30.3 g/dL (ref 30.0–36.0)
MCV: 69.9 fL — ABNORMAL LOW (ref 80.0–100.0)
Platelets: 371 10*3/uL (ref 150–400)
RBC: 3.96 MIL/uL (ref 3.87–5.11)
RDW: 18.6 % — ABNORMAL HIGH (ref 11.5–15.5)
WBC: 13.5 10*3/uL — ABNORMAL HIGH (ref 4.0–10.5)
nRBC: 0 % (ref 0.0–0.2)

## 2018-02-22 LAB — OCCULT BLOOD X 1 CARD TO LAB, STOOL
Fecal Occult Bld: NEGATIVE
Fecal Occult Bld: NEGATIVE
Fecal Occult Bld: NEGATIVE

## 2018-02-22 LAB — BPAM RBC
Blood Product Expiration Date: 202001222359
ISSUE DATE / TIME: 201912311550
Unit Type and Rh: 5100

## 2018-02-22 MED ORDER — SODIUM CHLORIDE 0.9 % IV SOLN: INTRAVENOUS | Status: DC | PRN

## 2018-02-22 MED ORDER — CALCIUM CARBONATE ANTACID 500 MG PO CHEW
2.5000 | CHEWABLE_TABLET | Freq: Two times a day (BID) | ORAL | Status: DC
  Filled 2018-02-22: qty 3

## 2018-02-22 MED ORDER — FERROUS SULFATE 325 (65 FE) MG PO TABS: 325.0000 mg | ORAL_TABLET | Freq: Two times a day (BID) | ORAL | 1 refills | Status: DC

## 2018-02-22 MED ORDER — CIPROFLOXACIN HCL 500 MG PO TABS: 500.0000 mg | ORAL_TABLET | Freq: Two times a day (BID) | ORAL | 0 refills | Status: AC

## 2018-02-22 MED ORDER — LEVOTHYROXINE SODIUM 25 MCG PO TABS
125.0000 ug | ORAL_TABLET | Freq: Every day | ORAL | Status: DC
  Administered 2018-02-22: 125 ug via ORAL
  Filled 2018-02-22: qty 1

## 2018-02-22 NOTE — Progress Notes (Signed)
Pt is being discharged home with [facility].  Discharge papers given and explained to pt and family, verbalized understanding.  Meds and f/u appointments reviewed. Rx given.

## 2018-02-22 NOTE — Care Management Note (Signed)
Case Management Note  Patient Details  Name: April Pittman MRN: [id number] Date of Birth: 1940/12/04  Subjective/Objective:  Patient is ready for discharge.  Home health ordered for RN, PT, and SW.  Patient lives at home with her husband, she and her husband do not drive but her daughter's do and she uses [facility] to get to doctors appointments.  Daughter [name] will take patient home at discharge.  Patient and family have decided on [place] for [facility].  Melene Muller with [facility] notified and will set up services.  PCP verified as Dr. Caryn Section.  Address and telephone numbers verified as correct in Epic.   Patient wanted resources for meals, RNCM recommended meals on wheels number given to patient and family to call and set up. Doran Clay RN BSN [phone]                  Action/Plan: Discharge home with husband and family- [place] with [place].  Expected Discharge Date:  02/22/18               Expected Discharge Plan:  [place]  In-House Referral:     Discharge planning Services  CM Consult  Post Acute Care Choice:  Home Health Choice offered to:  Adult Children, Patient  DME Arranged:    DME Agency:     HH Arranged:  RN, PT, Social Work [employer] Agency:  [facility], [place]  Status of Service:  Completed, signed off  If discussed at [place] of Stay Meetings, dates discussed:    Additional Comments:  Shelbie Hutching, RN 02/22/2018, 12:19 PM

## 2018-02-22 NOTE — Discharge Summary (Addendum)
[facility] [place] NAME: April Pittman    MR#:  [id number]  DATE OF BIRTH:  09-05-1940  DATE OF ADMISSION:  02/18/2018 ADMITTING PHYSICIAN: Loletha Grayer, MD  DATE OF DISCHARGE: 02/22/2018  1:40 PM  PRIMARY CARE PHYSICIAN: Birdie Sons, MD    ADMISSION DIAGNOSIS:  Pyelonephritis [N12] Sepsis with acute renal failure, due to unspecified organism, unspecified acute renal failure type, unspecified whether septic shock present ([place]) [A41.9, R65.20, N17.9]  DISCHARGE DIAGNOSIS:  Active Problems:   Sepsis ([place])   SECONDARY DIAGNOSIS:   Past Medical History:  Diagnosis Date  . Alopecia   . Anginal pain ([place])   . Benign neoplasm of large bowel   . CAD (coronary artery disease)   . Carpal tunnel syndrome   . Complication of anesthesia    vomits with any pain medicine  . DM (diabetes mellitus) ([place])   . Erosion of cervix   . GERD (gastroesophageal reflux disease)   . H/O adenomatous polyp of colon    with sever dysplasia, removed 2000 by colonoscopy  . History of chicken pox   . Hypercholesteremia   . Hypertension   . Hypothyroid   . Irritable bowel   . Malignant melanoma of foot ([place])   . Osteoporosis   . PONV (postoperative nausea and vomiting)   . Post-menopausal bleeding   . Procidentia of uterus    with traction cystocele, managed with gellhorn pessary  . Rectal bleeding   . TIA (transient ischemic attack)     HOSPITAL COURSE:   78 year old female with past medical history of previous TIA, osteoporosis, hypertension, hypothyroidism, hyperlipidemia, GERD, diabetes, history of coronary artery disease who presented to the hospital due to abdominal pain, weakness, lethargy and noted to have sepsis secondary to UTI.  1.  Sepsis- patient mets criteria given her leukocytosis, elevated lactic acid and urinalysis positive for UTI. The source of patient's sepsis was UTI/pyelonephritis.  Patient was treated with  broad-spectrum IV antibiotics with meropenem initially and then narrowed down to ceftriaxone.  Patient's urine and blood cultures were positive for E. coli which was fairly pansensitive. - Patient is now afebrile, white cell count has normalized.  She is being discharged on oral ciprofloxacin for additional 10 days.  2.  Urinary tract infection/Pyelonephritis - source of pt's sepsis.  -Patient's urine cultures are positive for E. coli which was fairly pansensitive. - CT Scan abd/pelvis was (-) for Nephrolithiasis.  Pt. Was treated with Meropenem initially then switched to IV ceftriaxone and now being discharged on Oral Cipro.   3.  Acute respiratory failure with hypoxia- secondary to volume overload/pulmonary edema.  Patient's chest x-ray on 02/19/18 showed interstitial marking markings consistent with pulmonary edema. Pt. Was Given IV Lasix and responded well to it and has improved.  - weaned off O2 and now down to RA.  Patient's echo showed normal ejection fraction with no wall motion abnormalities.    4.  Abdominal pain/nausea-secondary to UTI/pyelonephritis. Patient much improved after treatment for underlying pyelonephritis.  Patient is tolerating p.o. well.  Abdominal x-ray also was negative for any ileus obstruction.   5.  Essential hypertension- pt. Will continue Norvasc, metoprolol. - BP stable.   6.  Anemia-  patient had a significant microcytic anemia which was suggestive of iron deficiency. -Patient's iron studies were consistent with that.  Her Hemoccult was negative.  She was in 1 dose of IV iron, discharged on oral iron supplements.  She was referred to outpatient  hematology oncology.  7.  Hypokalemia- improved and resolved with supplementation.      8.  GERD- pt. Will continue Protonix.  9.  Hyperlipidemia- pt. Will continue simvastatin.  10.  Hypothyroidism- pt. Will continue Synthroid.  DISCHARGE CONDITIONS:   Stable.   CONSULTS OBTAINED:    DRUG ALLERGIES:   No Known Allergies  DISCHARGE MEDICATIONS:   Allergies as of 02/22/2018   No Known Allergies     Medication List    TAKE these medications   alendronate 70 MG tablet Commonly known as:  FOSAMAX Take 1 tablet (70 mg total) by mouth every 7 (seven) days. Take with a full glass of water on an empty stomach.   amLODipine 5 MG tablet Commonly known as:  NORVASC TAKE 1 TABLET (5 MG TOTAL) BY MOUTH DAILY.   aspirin EC 81 MG tablet Take 81 mg by mouth daily.   calcium carbonate 1250 (500 Ca) MG chewable tablet Commonly known as:  OS-CAL Chew 1 tablet by mouth 2 (two) times daily.   ciprofloxacin 500 MG tablet Commonly known as:  CIPRO Take 1 tablet (500 mg total) by mouth 2 (two) times daily for 10 days.   clopidogrel 75 MG tablet Commonly known as:  PLAVIX Take 75 mg by mouth daily.   estradiol 0.1 MG/GM vaginal cream Commonly known as:  ESTRACE Place 1 Applicatorful vaginally once a week.   ferrous sulfate 325 (65 FE) MG tablet Take 1 tablet (325 mg total) by mouth 2 (two) times daily with a meal.   glucose blood test strip Commonly known as:  ONE TOUCH ULTRA TEST USE TO CHECK BLOOD SUGAR DAILY. E 11.9 (TYPE 2 DIABETES MELLITIS)   levothyroxine 125 MCG tablet Commonly known as:  SYNTHROID, LEVOTHROID TAKE 1 TABLET BY MOUTH IN THE MORNING ON AN EMPTY STOMACH FOR THYROID   lisinopril-hydrochlorothiazide 20-25 MG tablet Commonly known as:  PRINZIDE,ZESTORETIC Take 1 tablet by mouth daily.   metFORMIN 1000 MG tablet Commonly known as:  GLUCOPHAGE Take 1 tablet (1,000 mg total) by mouth 2 (two) times daily.   metoprolol tartrate 50 MG tablet Commonly known as:  LOPRESSOR Take 50 mg by mouth 2 (two) times daily.   nitroGLYCERIN 0.4 MG SL tablet Commonly known as:  NITROSTAT Place 1 tablet (0.4 mg total) under the tongue every 5 (five) minutes as needed for chest pain.   PRESERVISION AREDS 2 PO Take 2 capsules by mouth daily.   simvastatin 40 MG tablet Commonly  known as:  ZOCOR TAKE 1 TABLET BY MOUTH EVERY DAY FOR CHOLESTEROL   vitamin B-12 1000 MCG tablet Commonly known as:  CYANOCOBALAMIN Take 1,000 mcg by mouth daily.         DISCHARGE INSTRUCTIONS:   DIET:  Cardiac diet  DISCHARGE CONDITION:  Stable  ACTIVITY:  Activity as tolerated  OXYGEN:  Home Oxygen: No.   Oxygen Delivery: room air  DISCHARGE LOCATION:  Home with [place], PT, Social Work.    If you experience worsening of your admission symptoms, develop shortness of breath, life threatening emergency, suicidal or homicidal thoughts you must seek medical attention immediately by calling 911 or calling your MD immediately  if symptoms less severe.  You Must read complete instructions/literature along with all the possible adverse reactions/side effects for all the Medicines you take and that have been prescribed to you. Take any new Medicines after you have completely understood and accpet all the possible adverse reactions/side effects.   Please note  You were cared  for by a hospitalist during your hospital stay. If you have any questions about your discharge medications or the care you received while you were in the hospital after you are discharged, you can call the unit and asked to speak with the hospitalist on call if the hospitalist that took care of you is not available. Once you are discharged, your primary care physician will handle any further medical issues. Please note that NO REFILLS for any discharge medications will be authorized once you are discharged, as it is imperative that you return to your primary care physician (or establish a relationship with a primary care physician if you do not have one) for your aftercare needs so that they can reassess your need for medications and monitor your lab values.     Today   Tolerating p.o. well, no shortness of breath.  Hemoglobin stable this morning.  Afebrile, hemodynamic stable.  Will discharge  home today on oral antibiotics.  VITAL SIGNS:  Blood pressure (!) 153/73, pulse 80, temperature 98.2 F (36.8 C), temperature source Oral, resp. rate 18, height 5' 4.5" (1.638 m), weight 66.9 kg, SpO2 97 %.  I/O:    Intake/Output Summary (Last 24 hours) at 02/22/2018 1632 Last data filed at 02/22/2018 0933 Gross per 24 hour  Intake 790 ml  Output -  Net 790 ml    PHYSICAL EXAMINATION:   GENERAL:  78 y.o.-year-old patient lying in bed in NAD.   EYES: Pupils equal, round, reactive to light and accommodation. No scleral icterus. Extraocular muscles intact. Pale conjunctiva HEENT: Head atraumatic, normocephalic. Oropharynx and nasopharynx clear.  NECK:  Supple, no jugular venous distention. No thyroid enlargement, no tenderness.  LUNGS: Normal breath sounds bilaterally, no wheezing, rales, rhonchi. No use of accessory muscles of respiration.  CARDIOVASCULAR: S1, S2 normal. No murmurs, rubs, or gallops.  ABDOMEN: Soft, nontender, nondistended. Bowel sounds present. No organomegaly or mass.  EXTREMITIES: No cyanosis, clubbing or edema b/l.    NEUROLOGIC: Cranial nerves II through XII are intact. No focal Motor or sensory deficits b/l. PSYCHIATRIC: The patient is alert and oriented x 3.  SKIN: No obvious rash, lesion, or ulcer.   DATA REVIEW:   CBC Recent Labs  Lab 02/22/18 0309  WBC 13.5*  HGB 8.4*  HCT 27.7*  PLT 371    Chemistries  Recent Labs  Lab 02/18/18 1032  02/19/18 0443  02/22/18 0309  NA 132*  --  135   < > 137  K 2.9*  --  3.3*   < > 3.8  CL 95*  --  105   < > 105  CO2 23  --  21*   < > 24  GLUCOSE 255*  --  204*   < > 153*  BUN 24*  --  27*   < > 26*  CREATININE 1.42*  --  1.26*   < > 1.16*  CALCIUM 8.2*  --  7.6*   < > 8.2*  MG  --    < > 2.3  --   --   AST 28  --   --   --   --   ALT 11  --   --   --   --   ALKPHOS 99  --   --   --   --   BILITOT 0.9  --   --   --   --    < > = values in this interval not displayed.    Cardiac Enzymes No  results  for input(s): TROPONINI in the last 168 hours.  Microbiology Results  Results for orders placed or performed during the hospital encounter of 02/18/18  Culture, blood (Routine x 2)     Status: Abnormal   Collection Time: 02/18/18 10:32 AM  Result Value Ref Range Status   Specimen Description   Final    BLOOD BLOOD RIGHT FOREARM Performed at [facility], [address], [place], [place] [address]    Special Requests   Final    BOTTLES DRAWN AEROBIC AND ANAEROBIC Blood Culture adequate volume Performed at [facility], [address] [place], [place] [address]    Culture  Setup Time   Final    GRAM NEGATIVE RODS IN BOTH AEROBIC AND ANAEROBIC BOTTLES CRITICAL VALUE NOTED.  VALUE IS CONSISTENT WITH PREVIOUSLY REPORTED AND CALLED VALUE. Performed at [facility], [address] [place], [place] [address]    Culture (A)  Final    ESCHERICHIA COLI SUSCEPTIBILITIES PERFORMED ON PREVIOUS CULTURE WITHIN THE LAST 5 DAYS. Performed at [facility], [place] [address] [place], [place] [address]    Report Status 02/21/2018 FINAL  Final  Culture, blood (Routine x 2)     Status: Abnormal   Collection Time: 02/18/18 10:32 AM  Result Value Ref Range Status   Specimen Description   Final    BLOOD BLOOD LEFT HAND Performed at [facility], [address] [place], [place] [address]    Special Requests   Final    BOTTLES DRAWN AEROBIC AND ANAEROBIC Blood Culture adequate volume Performed at [facility], [address] [place], [place] [address]    Culture  Setup Time   Final    IN BOTH AEROBIC AND ANAEROBIC BOTTLES GRAM NEGATIVE RODS CRITICAL RESULT CALLED TO, READ BACK BY AND VERIFIED WITH: JASON ROBBINS ON 02/18/18 AT 2203 [facility]    Culture ESCHERICHIA COLI (A)  Final   Report Status 02/21/2018 FINAL  Final   Organism ID, Bacteria ESCHERICHIA COLI  Final      Susceptibility   Escherichia coli - MIC*    AMPICILLIN >=32 RESISTANT  Resistant     CEFAZOLIN 16 SENSITIVE Sensitive     CEFEPIME <=1 SENSITIVE Sensitive     CEFTAZIDIME <=1 SENSITIVE Sensitive     CEFTRIAXONE <=1 SENSITIVE Sensitive     CIPROFLOXACIN <=0.25 SENSITIVE Sensitive     GENTAMICIN <=1 SENSITIVE Sensitive     IMIPENEM <=0.25 SENSITIVE Sensitive     TRIMETH/SULFA <=20 SENSITIVE Sensitive     AMPICILLIN/SULBACTAM >=32 RESISTANT Resistant     PIP/TAZO <=4 SENSITIVE Sensitive     Extended ESBL NEGATIVE Sensitive     * ESCHERICHIA COLI  Urine culture     Status: Abnormal   Collection Time: 02/18/18 10:32 AM  Result Value Ref Range Status   Specimen Description   Final    URINE, CATHETERIZED Performed at [facility], [address], [place], [place] [address]    Special Requests   Final    NONE Performed at [facility], [address] [place], [place] [address]    Culture >=100,000 COLONIES/mL ESCHERICHIA COLI (A)  Final   Report Status 02/20/2018 FINAL  Final   Organism ID, Bacteria ESCHERICHIA COLI (A)  Final      Susceptibility   Escherichia coli - MIC*    AMPICILLIN >=32 RESISTANT Resistant     CEFAZOLIN <=4 SENSITIVE Sensitive     CEFTRIAXONE <=1 SENSITIVE Sensitive  CIPROFLOXACIN <=0.25 SENSITIVE Sensitive     GENTAMICIN <=1 SENSITIVE Sensitive     IMIPENEM <=0.25 SENSITIVE Sensitive     NITROFURANTOIN <=16 SENSITIVE Sensitive     TRIMETH/SULFA <=20 SENSITIVE Sensitive     AMPICILLIN/SULBACTAM >=32 RESISTANT Resistant     PIP/TAZO <=4 SENSITIVE Sensitive     Extended ESBL NEGATIVE Sensitive     * >=100,000 COLONIES/mL ESCHERICHIA COLI  Blood Culture ID Panel (Reflexed)     Status: Abnormal   Collection Time: 02/18/18 10:32 AM  Result Value Ref Range Status   Enterococcus species NOT DETECTED NOT DETECTED Final   Listeria monocytogenes NOT DETECTED NOT DETECTED Final   Staphylococcus species NOT DETECTED NOT DETECTED Final   Staphylococcus aureus (BCID) NOT DETECTED NOT DETECTED Final   Streptococcus  species NOT DETECTED NOT DETECTED Final   Streptococcus agalactiae NOT DETECTED NOT DETECTED Final   Streptococcus pneumoniae NOT DETECTED NOT DETECTED Final   Streptococcus pyogenes NOT DETECTED NOT DETECTED Final   Acinetobacter baumannii NOT DETECTED NOT DETECTED Final   Enterobacteriaceae species DETECTED (A) NOT DETECTED Final    Comment: Enterobacteriaceae represent a large family of gram-negative bacteria, not a single organism. CRITICAL RESULT CALLED TO, READ BACK BY AND VERIFIED WITH: JASON ROBBINS ON 02/18/18 AT 2203 [facility]    Enterobacter cloacae complex NOT DETECTED NOT DETECTED Final   Escherichia coli DETECTED (A) NOT DETECTED Final    Comment: CRITICAL RESULT CALLED TO, READ BACK BY AND VERIFIED WITH: JASON ROBBINS ON 02/18/18 AT 2203 [facility]    Klebsiella oxytoca NOT DETECTED NOT DETECTED Final   Klebsiella pneumoniae NOT DETECTED NOT DETECTED Final   Proteus species NOT DETECTED NOT DETECTED Final   Serratia marcescens NOT DETECTED NOT DETECTED Final   Carbapenem resistance NOT DETECTED NOT DETECTED Final   Haemophilus influenzae NOT DETECTED NOT DETECTED Final   Neisseria meningitidis NOT DETECTED NOT DETECTED Final   Pseudomonas aeruginosa NOT DETECTED NOT DETECTED Final   Candida albicans NOT DETECTED NOT DETECTED Final   Candida glabrata NOT DETECTED NOT DETECTED Final   Candida krusei NOT DETECTED NOT DETECTED Final   Candida parapsilosis NOT DETECTED NOT DETECTED Final   Candida tropicalis NOT DETECTED NOT DETECTED Final    Comment: Performed at [facility], [address] [place], [place] [address]  Gastrointestinal Panel by PCR , Stool     Status: None   Collection Time: 02/19/18  3:39 PM  Result Value Ref Range Status   Campylobacter species NOT DETECTED NOT DETECTED Final   Plesimonas shigelloides NOT DETECTED NOT DETECTED Final   Salmonella species NOT DETECTED NOT DETECTED Final   Yersinia enterocolitica NOT DETECTED NOT DETECTED Final   Vibrio  species NOT DETECTED NOT DETECTED Final   Vibrio cholerae NOT DETECTED NOT DETECTED Final   Enteroaggregative E coli (EAEC) NOT DETECTED NOT DETECTED Final   Enteropathogenic E coli (EPEC) NOT DETECTED NOT DETECTED Final   Enterotoxigenic E coli (ETEC) NOT DETECTED NOT DETECTED Final   Shiga like toxin producing E coli (STEC) NOT DETECTED NOT DETECTED Final   Shigella/Enteroinvasive E coli (EIEC) NOT DETECTED NOT DETECTED Final   Cryptosporidium NOT DETECTED NOT DETECTED Final   Cyclospora cayetanensis NOT DETECTED NOT DETECTED Final   Entamoeba histolytica NOT DETECTED NOT DETECTED Final   Giardia lamblia NOT DETECTED NOT DETECTED Final   Adenovirus F40/41 NOT DETECTED NOT DETECTED Final   Astrovirus NOT DETECTED NOT DETECTED Final   Norovirus GI/GII NOT DETECTED NOT DETECTED Final   Rotavirus A NOT  DETECTED NOT DETECTED Final   Sapovirus (I, II, IV, and V) NOT DETECTED NOT DETECTED Final    Comment: Performed at [facility], [address] [place], [place] [address]  C difficile quick scan w PCR reflex     Status: None   Collection Time: 02/19/18  3:39 PM  Result Value Ref Range Status   C Diff antigen NEGATIVE NEGATIVE Final   C Diff toxin NEGATIVE NEGATIVE Final   C Diff interpretation No C. difficile detected.  Final    Comment: Performed at [facility], [address] [place], [place] [address]    RADIOLOGY:  Dg Abd 1 View  Result Date: 02/21/2018 CLINICAL DATA:  Abdominal distension EXAM: ABDOMEN - 1 VIEW COMPARISON:  CT abdomen and pelvis February 18, 2018 FINDINGS: There is no bowel dilatation or air-fluid level to suggest bowel obstruction. No free air. There is mild stool in the colon. Lung bases are clear. There is a pessary in the lower pelvis. IMPRESSION: No bowel obstruction or free air. Lung bases clear. Pessary in lower pelvis. Electronically Signed   By: Lowella Grip III M.D.   On: 02/21/2018 09:53      Management plans discussed with  the patient, family and they are in agreement.  CODE STATUS:     Code Status Orders  (From admission, onward)         Start     Ordered   02/18/18 1235  Full code  Continuous     02/18/18 1235          TOTAL TIME TAKING CARE OF THIS PATIENT: 50 minutes.    Henreitta Leber M.D on 02/22/2018 at 4:32 PM  Between 7am to 6pm - Pager - [phone]  After 6pm go to [email or web address] - Proofreader  [facility]  Office  [phone]  CC: Primary care physician; Birdie Sons, MD

## 2018-02-23 ENCOUNTER — Telehealth: Payer: Self-pay | Admitting: Family Medicine

## 2018-02-23 ENCOUNTER — Telehealth: Payer: Self-pay

## 2018-02-23 DIAGNOSIS — Z7902 Long term (current) use of antithrombotics/antiplatelets: Secondary | ICD-10-CM | POA: Diagnosis not present

## 2018-02-23 DIAGNOSIS — E119 Type 2 diabetes mellitus without complications: Secondary | ICD-10-CM | POA: Diagnosis not present

## 2018-02-23 DIAGNOSIS — I10 Essential (primary) hypertension: Secondary | ICD-10-CM | POA: Diagnosis not present

## 2018-02-23 DIAGNOSIS — B9620 Unspecified Escherichia coli [E. coli] as the cause of diseases classified elsewhere: Secondary | ICD-10-CM | POA: Diagnosis not present

## 2018-02-23 DIAGNOSIS — Z8673 Personal history of transient ischemic attack (TIA), and cerebral infarction without residual deficits: Secondary | ICD-10-CM | POA: Diagnosis not present

## 2018-02-23 DIAGNOSIS — K589 Irritable bowel syndrome without diarrhea: Secondary | ICD-10-CM | POA: Diagnosis not present

## 2018-02-23 DIAGNOSIS — Z7984 Long term (current) use of oral hypoglycemic drugs: Secondary | ICD-10-CM | POA: Diagnosis not present

## 2018-02-23 DIAGNOSIS — E785 Hyperlipidemia, unspecified: Secondary | ICD-10-CM | POA: Diagnosis not present

## 2018-02-23 DIAGNOSIS — Z86018 Personal history of other benign neoplasm: Secondary | ICD-10-CM | POA: Diagnosis not present

## 2018-02-23 DIAGNOSIS — E039 Hypothyroidism, unspecified: Secondary | ICD-10-CM | POA: Diagnosis not present

## 2018-02-23 DIAGNOSIS — N10 Acute tubulo-interstitial nephritis: Secondary | ICD-10-CM | POA: Diagnosis not present

## 2018-02-23 DIAGNOSIS — Z791 Long term (current) use of non-steroidal anti-inflammatories (NSAID): Secondary | ICD-10-CM | POA: Diagnosis not present

## 2018-02-23 DIAGNOSIS — D509 Iron deficiency anemia, unspecified: Secondary | ICD-10-CM | POA: Diagnosis not present

## 2018-02-23 DIAGNOSIS — Z7982 Long term (current) use of aspirin: Secondary | ICD-10-CM | POA: Diagnosis not present

## 2018-02-23 DIAGNOSIS — I2510 Atherosclerotic heart disease of native coronary artery without angina pectoris: Secondary | ICD-10-CM | POA: Diagnosis not present

## 2018-02-23 DIAGNOSIS — K219 Gastro-esophageal reflux disease without esophagitis: Secondary | ICD-10-CM | POA: Diagnosis not present

## 2018-02-23 DIAGNOSIS — M810 Age-related osteoporosis without current pathological fracture: Secondary | ICD-10-CM | POA: Diagnosis not present

## 2018-02-23 NOTE — Telephone Encounter (Signed)
She can have the 9, 10, or 3pm slot on Friday.

## 2018-02-23 NOTE — Telephone Encounter (Signed)
Transition Care Management Follow-up Telephone Call  Date of discharge and from where: [facility] on 02/22/18.  How have you been since you were released from the hospital? Not doing well, feels like she shouldn't have left hospital. Pt states she has coughed so much she has chest discomfort. Pt has had a headache and some diarrhea this AM. Declines fever, nausea or vomiting.   Any questions or concerns? Yes , what can she take OTC for her cough? Also needs a verbal order for [facility] once weekly x 5 weeks.   Items Reviewed:  Did the pt receive and understand the discharge instructions provided? Yes   Medications obtained and verified? No, pt declined. Pt did confirm she is taking two new abx.  Any new allergies since your discharge? No   Dietary orders reviewed? Yes  Do you have support at home? No  Other (ie: DME, Home Health, etc) Yes, [facility] plans to come out to the home once weekly x 5 weeks if given order.   Functional Questionnaire: (I = Independent and D = Dependent)  Bathing/Dressing- I   Meal Prep- I  Eating- I  Maintaining continence- I  Transferring/Ambulation- I  Managing Meds- I   Follow up appointments reviewed:    PCP Hospital f/u appt confirmed? No , awaiting approval to be worked in. See other telephone encounter.   [facility] f/u appt confirmed? No, pt is trying to get an apt with Dr. Mike Gip.  Are transportation arrangements needed? No   If their condition worsens, is the pt aware to call  their PCP or go to the ED? Yes  Was the patient provided with contact information for the PCP's office or ED? Yes  Was the pt encouraged to call back with questions or concerns? Yes

## 2018-02-23 NOTE — Telephone Encounter (Signed)
Pt's sister needing to know if Dr. Caryn Section could work her in for the next hospital f/u available asap before a week out.  Please advise.  Thanks, [employer]

## 2018-02-24 ENCOUNTER — Ambulatory Visit (INDEPENDENT_AMBULATORY_CARE_PROVIDER_SITE_OTHER): Payer: PPO | Admitting: Family Medicine

## 2018-02-24 ENCOUNTER — Encounter: Payer: Self-pay | Admitting: Family Medicine

## 2018-02-24 VITALS — BP 128/60 | HR 72 | Temp 98.4°F | Resp 16 | Wt 148.0 lb

## 2018-02-24 DIAGNOSIS — D649 Anemia, unspecified: Secondary | ICD-10-CM | POA: Diagnosis not present

## 2018-02-24 DIAGNOSIS — R197 Diarrhea, unspecified: Secondary | ICD-10-CM | POA: Diagnosis not present

## 2018-02-24 DIAGNOSIS — N12 Tubulo-interstitial nephritis, not specified as acute or chronic: Secondary | ICD-10-CM

## 2018-02-24 DIAGNOSIS — N179 Acute kidney failure, unspecified: Secondary | ICD-10-CM | POA: Diagnosis not present

## 2018-02-24 DIAGNOSIS — E039 Hypothyroidism, unspecified: Secondary | ICD-10-CM | POA: Diagnosis not present

## 2018-02-24 NOTE — Patient Instructions (Signed)
.   Please bring all of your medications to every appointment so we can make sure that our medication list is the same as yours.    Start eating 1-2 cups of yogurt daily until diarrhea has resolved. You could also take OTC Pro-biotic daily

## 2018-02-24 NOTE — Progress Notes (Signed)
Patient: April Pittman Female    DOB: December 20, 1940   78 y.o.   MRN: [id number] Visit Date: 02/24/2018  Today's Provider: [name], MD   Chief Complaint  Patient presents with  . Hospitalization Follow-up  . Anemia   Subjective:     Anemia  Presents for follow-up visit. Symptoms include light-headedness and paresthesias. There has been no abdominal pain, bruising/bleeding easily, fever, leg swelling or malaise/fatigue. Pica: Left fingers. There are no compliance problems.   URI   This is a new problem. The problem has been gradually improving. Associated symptoms include congestion, coughing, diarrhea, headaches, sinus pain and a sore throat. Pertinent negatives include no abdominal pain, ear pain, nausea, vomiting or wheezing.      Follow up Hospitalization  Patient was admitted to [facility] on 02/18/2018 and discharged on 02/22/2018. She was treated for Sepsis with acute renal failure, UTI/Pyelonephritis, Anemia Treatment for this included Blood transfusion, Cipro, and Iron BID. Telephone follow up was note done due her being discharged less than 2 days ago.  She reports excellent compliance with treatment. She reports this condition is Improved. She reports she has follow up with hematology on 03-03-2018 at 1:45 Dr. Felicity Coyer ------------------------------------------------------------------------------------  Lab Results  Component Value Date   IRON <5 (L) 02/19/2018   TIBC 294 02/19/2018   FERRITIN 61 02/19/2018        No Known Allergies   Current Outpatient Medications:  .  alendronate (FOSAMAX) 70 MG tablet, Take 1 tablet (70 mg total) by mouth every 7 (seven) days. Take with a full glass of water on an empty stomach., Disp: 12 tablet, Rfl: 4 .  amLODipine (NORVASC) 5 MG tablet, TAKE 1 TABLET (5 MG TOTAL) BY MOUTH DAILY., Disp: 30 tablet, Rfl: 12 .  aspirin EC 81 MG tablet, Take 81 mg by mouth daily., Disp: , Rfl:  .  calcium carbonate (OS-CAL) 1250 (500  Ca) MG chewable tablet, Chew 1 tablet by mouth 2 (two) times daily., Disp: , Rfl:  .  ciprofloxacin (CIPRO) 500 MG tablet, Take 1 tablet (500 mg total) by mouth 2 (two) times daily for 10 days., Disp: 20 tablet, Rfl: 0 .  clopidogrel (PLAVIX) 75 MG tablet, Take 75 mg by mouth daily., Disp: , Rfl:  .  estradiol (ESTRACE) 0.1 MG/GM vaginal cream, Place 1 Applicatorful vaginally once a week., Disp: , Rfl:  .  ferrous sulfate 325 (65 FE) MG tablet, Take 1 tablet (325 mg total) by mouth 2 (two) times daily with a meal., Disp: 60 tablet, Rfl: 1 .  glucose blood (ONE TOUCH ULTRA TEST) test strip, USE TO CHECK BLOOD SUGAR DAILY. E 11.9 (TYPE 2 DIABETES MELLITIS), Disp: 100 each, Rfl: 4 .  levothyroxine (SYNTHROID, LEVOTHROID) 125 MCG tablet, TAKE 1 TABLET BY MOUTH IN THE MORNING ON AN EMPTY STOMACH FOR THYROID, Disp: 90 tablet, Rfl: 3 .  lisinopril-hydrochlorothiazide (PRINZIDE,ZESTORETIC) 20-25 MG tablet, Take 1 tablet by mouth daily., Disp: 90 tablet, Rfl: 3 .  metFORMIN (GLUCOPHAGE) 1000 MG tablet, Take 1 tablet (1,000 mg total) by mouth 2 (two) times daily., Disp: 180 tablet, Rfl: 3 .  metoprolol (LOPRESSOR) 50 MG tablet, Take 50 mg by mouth 2 (two) times daily. , Disp: , Rfl:  .  Multiple Vitamins-Minerals (PRESERVISION AREDS 2 PO), Take 2 capsules by mouth daily., Disp: , Rfl:  .  nitroGLYCERIN (NITROSTAT) 0.4 MG SL tablet, Place 1 tablet (0.4 mg total) under the tongue every 5 (five) minutes as needed for chest  pain., Disp: 30 tablet, Rfl: 0 .  simvastatin (ZOCOR) 40 MG tablet, TAKE 1 TABLET BY MOUTH EVERY DAY FOR CHOLESTEROL, Disp: 30 tablet, Rfl: 12 .  vitamin B-12 (CYANOCOBALAMIN) 1000 MCG tablet, Take 1,000 mcg by mouth daily. , Disp: , Rfl:   Review of Systems  Constitutional: Positive for fatigue. Negative for activity change, appetite change, chills, diaphoresis, fever, malaise/fatigue and unexpected weight change.  HENT: Positive for congestion, postnasal drip, sinus pressure, sinus pain and  sore throat. Negative for ear discharge and ear pain.   Respiratory: Positive for cough. Negative for apnea, choking, chest tightness, shortness of breath, wheezing and stridor.   Cardiovascular: Negative.   Gastrointestinal: Positive for diarrhea. Negative for abdominal distention, abdominal pain, anal bleeding, blood in stool, constipation, nausea, rectal pain and vomiting.  Neurological: Positive for weakness, light-headedness, headaches and paresthesias. Negative for dizziness.  Hematological: Does not bruise/bleed easily.    Social History   Tobacco Use  . Smoking status: Never Smoker  . Smokeless tobacco: Never Used  Substance Use Topics  . Alcohol use: No    Alcohol/week: 0.0 standard drinks      Objective:   BP 128/60 (BP Location: Right Arm, Patient Position: Sitting, Cuff Size: Normal)   Pulse 72   Temp 98.4 F (36.9 C) (Oral)   Resp 16   Wt 148 lb (67.1 kg)   SpO2 98%   BMI 25.01 kg/m  Vitals:   02/24/18 1026  BP: 128/60  Pulse: 72  Resp: 16  Temp: 98.4 F (36.9 C)  TempSrc: Oral  SpO2: 98%  Weight: 148 lb (67.1 kg)     Physical Exam   General Appearance:    Alert, cooperative, no distress  Eyes:    PERRL, conjunctiva/corneas clear, EOM's intact       Lungs:     Clear to auscultation bilaterally, respirations unlabored  Heart:    Regular rate and rhythm  Neurologic:   Awake, alert, oriented x 3. No apparent focal neurological           defect.          Assessment & Plan     1. Pyelonephritis Taking antibiotic consistently, greatly improved after recent hospitalization.   2. Diarrhea, unspecified type She states this started before she was put in hospital and mostly gone after taking Imodium yesterday. Advised she could take OTC pro-biotics or Yogurt. Call if sx return or worsen.   3. Hypothyroidism, unspecified type  - TSH  4. Acute renal failure, unspecified acute renal failure type ([place]) Resolved with IVF encouraged abundant fluid intake.   - Renal function panel  5. Anemia, unspecified type Low iron but normal IBC and ferritin at recent hospitalization. Likely exacerbated by sepsis. Has follow up with hematology scheduled next week.  - CBC     [name], MD  [facility]

## 2018-02-25 LAB — CBC
Hematocrit: 28.6 % — ABNORMAL LOW (ref 34.0–46.6)
Hemoglobin: 8.7 g/dL — ABNORMAL LOW (ref 11.1–15.9)
MCH: 22.1 pg — ABNORMAL LOW (ref 26.6–33.0)
MCHC: 30.4 g/dL — ABNORMAL LOW (ref 31.5–35.7)
MCV: 73 fL — ABNORMAL LOW (ref 79–97)
Platelets: 547 10*3/uL — ABNORMAL HIGH (ref 150–450)
RBC: 3.94 x10E6/uL (ref 3.77–5.28)
RDW: 18.6 % — ABNORMAL HIGH (ref 12.3–15.4)
WBC: 12.4 10*3/uL — ABNORMAL HIGH (ref 3.4–10.8)

## 2018-02-25 LAB — RENAL FUNCTION PANEL
ALBUMIN: 3.4 g/dL — AB (ref 3.5–4.8)
BUN/Creatinine Ratio: 16 (ref 12–28)
BUN: 14 mg/dL (ref 8–27)
CO2: 23 mmol/L (ref 20–29)
Calcium: 9.6 mg/dL (ref 8.7–10.3)
Chloride: 100 mmol/L (ref 96–106)
Creatinine, Ser: 0.89 mg/dL (ref 0.57–1.00)
GFR calc Af Amer: 72 mL/min/{1.73_m2} (ref >59)
GFR calc non Af Amer: 63 mL/min/{1.73_m2} (ref >59)
Glucose: 138 mg/dL — ABNORMAL HIGH (ref 65–99)
Phosphorus: 3.2 mg/dL (ref 2.5–4.5)
Potassium: 4 mmol/L (ref 3.5–5.2)
Sodium: 138 mmol/L (ref 134–144)

## 2018-02-25 LAB — TSH: TSH: 4.87 u[IU]/mL — ABNORMAL HIGH (ref 0.450–4.500)

## 2018-02-27 ENCOUNTER — Telehealth: Payer: Self-pay

## 2018-02-27 NOTE — Telephone Encounter (Signed)
Requesting verbal orders for nursing once a week for 5 weeks.

## 2018-02-27 NOTE — Telephone Encounter (Signed)
OK for PT as below.

## 2018-02-27 NOTE — Telephone Encounter (Signed)
Pt advised.   Thanks,   -Laura  

## 2018-02-27 NOTE — Telephone Encounter (Signed)
-----   Message from Birdie Sons, MD sent at 02/26/2018  3:02 PM EST ----- Hemoglobin is stable at 8.7 (up from 8.4 at discharge) rest of labs are good. Follow up hematology as scheduled.

## 2018-02-27 NOTE — Telephone Encounter (Signed)
Requesting PT twice a week for 3 weeks.  Report not feeling well, feeling tired and weak. Patient reports that she vomited last night. BP is 88/50. On Saturday patient had a fall. Patient does not have any bruises, is c/o knee pain.

## 2018-02-28 ENCOUNTER — Telehealth: Payer: Self-pay | Admitting: Family Medicine

## 2018-02-28 ENCOUNTER — Telehealth: Payer: Self-pay

## 2018-02-28 NOTE — Telephone Encounter (Signed)
Pt's sister advised of lab results.   Thanks,   -Mickel Baas

## 2018-02-28 NOTE — Telephone Encounter (Signed)
She is supposed to be on both aspirin and Plavix which is prescribed be her cardiologist due to having heart disease with stent.  Am aware of possible interaction with votaren gel, but risk is very low since it is being applied topically. Can keep it on hold for now until her anemia work up is competed.

## 2018-02-28 NOTE — Telephone Encounter (Signed)
[facility] w/ [facility] - [phone]  clopidogrel (PLAVIX) 75 MG tablet severe interaction with aspirin EC 81 MG tablet  Voltaren gel is also interacting with the aspirin EC 81 MG tablet as well.  April Pittman told pt to stop gel for now. Needing someone to call her back.  Thanks, [employer]

## 2018-02-28 NOTE — Telephone Encounter (Signed)
Amy advised.  ° ° °Thanks,  ° °-Keanan Melander °

## 2018-02-28 NOTE — Telephone Encounter (Signed)
EMMI Follow-up: Noted on the report that the patient hadn't read her discharge papers yet, didn't know who to contact if there was a change in her condition, and had some other questions.  I talked with Mrs. Nakata and she had scheduled her follow-up appointment with Dr. Caryn Section, they had taken more lab work and has [facility] on Friday.  Expecting [place] RN at 2 pm. Wondered how she would know if the two antibotics cleared everything up and suggested if she notices any changes to contact her PCP right away.  She thanked me for calling.

## 2018-02-28 NOTE — Telephone Encounter (Signed)
Pt's sister- Elbert Ewings  is calling to find out the results from recent labs.  Please advise.  Thanks, [employer]

## 2018-03-01 ENCOUNTER — Ambulatory Visit: Payer: Self-pay | Admitting: Pharmacist

## 2018-03-01 DIAGNOSIS — N12 Tubulo-interstitial nephritis, not specified as acute or chronic: Secondary | ICD-10-CM

## 2018-03-01 DIAGNOSIS — N179 Acute kidney failure, unspecified: Secondary | ICD-10-CM

## 2018-03-01 NOTE — Chronic Care Management (AMB) (Signed)
Chronic Care Management   Note  03/01/2018 Name: April Pittman MRN: [id number] DOB: 08-Mar-1940    Reason for referral: 66 Day post discharge medication reviews  Referral source: [facility]  Current insurance:[employer]  PMHx: HTN, CAD, T2DM, hypothyroid, osteoporosis, osteoarthtitis, vitamin B12 and Vitamin D deficiencies   HPI: Hospitalized 02/18/18 to 02/22/2018 for sepsis 2/2 pyleonephritis, ARF; treated with meropenem, IV ceftriaxone, and discharged on PO cipro   Objective: Lab Results  Component Value Date   CREATININE 0.89 02/24/2018   CREATININE 1.16 (H) 02/22/2018   CREATININE 1.22 (H) 02/21/2018    Lab Results  Component Value Date   HGBA1C 7.6 (H) 02/18/2018    Lipid Panel     Component Value Date/Time   CHOL 147 04/28/2016 1022   TRIG 196 (H) 04/28/2016 1022   HDL 47 04/28/2016 1022   CHOLHDL 3.1 04/28/2016 1022   LDLCALC 61 04/28/2016 1022   LDLDIRECT 73 05/11/2017 1145    BP Readings from Last 3 Encounters:  02/24/18 128/60  02/22/18 (!) 153/73  12/15/17 128/60    No Known Allergies  Medications Reviewed Today    Reviewed by Cathi Roan, [place] (Pharmacist) on 03/01/18 at 1610  Med List Status: <None>  Medication Order Taking? Sig Documenting Provider Last Dose Status Informant  alendronate (FOSAMAX) 70 MG tablet [id number] Yes Take 1 tablet (70 mg total) by mouth every 7 (seven) days. Take with a full glass of water on an empty stomach. Birdie Sons, MD Taking Active Self  amLODipine (NORVASC) 5 MG tablet [id number] Yes TAKE 1 TABLET (5 MG TOTAL) BY MOUTH DAILY. Birdie Sons, MD Taking Active Self  aspirin EC 81 MG tablet [id number] Yes Take 81 mg by mouth daily. [provider] Taking Active Self  calcium carbonate (OS-CAL) 1250 (500 Ca) MG chewable tablet [id number] Yes Chew 1 tablet by mouth 2 (two) times daily. [provider] Taking Active Self  ciprofloxacin (CIPRO) 500 MG tablet [id number] Yes Take 1 tablet (500  mg total) by mouth 2 (two) times daily for 10 days. Henreitta Leber, MD Taking Active            Med Note Kary Kos, [place] Mar 01, 2018  4:10 PM) Will finish course 03/03/18  clopidogrel (PLAVIX) 75 MG tablet [id number] Yes Take 75 mg by mouth daily. [provider] Taking Active Self  estradiol (ESTRACE) 0.1 MG/GM vaginal cream [id number] Yes Place 1 Applicatorful vaginally once a week. [provider] Taking Active Self  ferrous sulfate 325 (65 FE) MG tablet [id number] Yes Take 1 tablet (325 mg total) by mouth 2 (two) times daily with a meal. Sainani, Belia Heman, MD Taking Active   glucose blood (ONE TOUCH ULTRA TEST) test strip [id number] Yes USE TO CHECK BLOOD SUGAR DAILY. E 11.9 (TYPE 2 DIABETES MELLITIS) Birdie Sons, MD Taking Active Self  levothyroxine (SYNTHROID, LEVOTHROID) 125 MCG tablet [id number] Yes TAKE 1 TABLET BY MOUTH IN THE MORNING ON AN EMPTY STOMACH FOR THYROID Birdie Sons, MD Taking Active Self  lisinopril-hydrochlorothiazide (PRINZIDE,ZESTORETIC) 20-25 MG tablet [id number] Yes Take 1 tablet by mouth daily. Birdie Sons, MD Taking Active Self  metFORMIN (GLUCOPHAGE) 1000 MG tablet [id number] Yes Take 1 tablet (1,000 mg total) by mouth 2 (two) times daily. Birdie Sons, MD Taking Active Self  metoprolol (LOPRESSOR) 50 MG tablet [id number] Yes Take 50 mg by mouth 2 (two) times daily.  [provider] Taking Active Self  Multiple Vitamins-Minerals (PRESERVISION  AREDS 2 PO) [id number] Yes Take 2 capsules by mouth daily. [provider] Taking Active Self  nitroGLYCERIN (NITROSTAT) 0.4 MG SL tablet [id number]  Place 1 tablet (0.4 mg total) under the tongue every 5 (five) minutes as needed for chest pain. Birdie Sons, MD  Active Self  simvastatin (ZOCOR) 40 MG tablet [id number] Yes TAKE 1 TABLET BY MOUTH EVERY DAY FOR CHOLESTEROL Birdie Sons, MD Taking Active Self  vitamin B-12 (CYANOCOBALAMIN) 1000 MCG tablet [id number] Yes Take  1,000 mcg by mouth daily.  [provider] Taking Active Self           ASSESSMENT: Date Discharged from Hospital: 02/22/2018 Date Medication Reconciliation Performed: 03/01/2018   Medications Discontinued at Discharge:    New Medications at Discharge:   Cipro 500mg  BID for 10 days  Ferrous sulfate 325mg  (65 mg elemental Fe) BID   Patient was recently discharged from hospital and all medications have been reviewed.  Drugs sorted by indication:  HTN: lisinopril-HCTZ, metoprolol  CAD: aspirin, clopidogrel, metoprolol, nitroglycerin, simvastatin, amlodipine  DM: metformin  Osteoporosis: alendronate, estradiol  Vitamin B12 deficiency: vitamin B12, iron  Vitamin D deficiency: vitamin D, iron  Hypothyroid: levothyroxine  Medication Review Findings:  . Monitor levothyroxine levels as patient is taking iron, cipro, variety of vitamins; TSH on 02/24/18 slightly elevated  Plan:  Patient was recently discharged from hospital and all medications have been reviewed. Provided patient with CCM contact information should she have any needs in the future.    Ruben Reason, PharmD Clinical Pharmacist [place] [facility] [phone]

## 2018-03-01 NOTE — Telephone Encounter (Signed)
Spoke to [place] with [facility] and advised about the interaction and per Dr Caryn Section advised pt to put voltaren gel on hold until anemia work up is done.  dbs

## 2018-03-02 DIAGNOSIS — I2510 Atherosclerotic heart disease of native coronary artery without angina pectoris: Secondary | ICD-10-CM | POA: Diagnosis not present

## 2018-03-02 DIAGNOSIS — I10 Essential (primary) hypertension: Secondary | ICD-10-CM | POA: Diagnosis not present

## 2018-03-02 DIAGNOSIS — E119 Type 2 diabetes mellitus without complications: Secondary | ICD-10-CM | POA: Diagnosis not present

## 2018-03-02 DIAGNOSIS — M810 Age-related osteoporosis without current pathological fracture: Secondary | ICD-10-CM | POA: Diagnosis not present

## 2018-03-02 DIAGNOSIS — K219 Gastro-esophageal reflux disease without esophagitis: Secondary | ICD-10-CM

## 2018-03-02 DIAGNOSIS — D509 Iron deficiency anemia, unspecified: Secondary | ICD-10-CM | POA: Diagnosis not present

## 2018-03-02 DIAGNOSIS — K589 Irritable bowel syndrome without diarrhea: Secondary | ICD-10-CM | POA: Diagnosis not present

## 2018-03-02 DIAGNOSIS — B9620 Unspecified Escherichia coli [E. coli] as the cause of diseases classified elsewhere: Secondary | ICD-10-CM | POA: Diagnosis not present

## 2018-03-03 ENCOUNTER — Inpatient Hospital Stay: Payer: PPO | Attending: Oncology | Admitting: Oncology

## 2018-03-03 ENCOUNTER — Encounter: Payer: Self-pay | Admitting: Oncology

## 2018-03-03 ENCOUNTER — Other Ambulatory Visit: Payer: Self-pay

## 2018-03-03 ENCOUNTER — Telehealth: Payer: Self-pay

## 2018-03-03 VITALS — BP 103/60 | HR 68 | Temp 96.7°F | Resp 18 | Ht 63.0 in | Wt 139.3 lb

## 2018-03-03 DIAGNOSIS — Z87891 Personal history of nicotine dependence: Secondary | ICD-10-CM | POA: Insufficient documentation

## 2018-03-03 DIAGNOSIS — D75839 Thrombocytosis, unspecified: Secondary | ICD-10-CM

## 2018-03-03 DIAGNOSIS — D509 Iron deficiency anemia, unspecified: Secondary | ICD-10-CM

## 2018-03-03 DIAGNOSIS — D508 Other iron deficiency anemias: Secondary | ICD-10-CM | POA: Insufficient documentation

## 2018-03-03 DIAGNOSIS — D473 Essential (hemorrhagic) thrombocythemia: Secondary | ICD-10-CM | POA: Insufficient documentation

## 2018-03-03 DIAGNOSIS — R030 Elevated blood-pressure reading, without diagnosis of hypertension: Secondary | ICD-10-CM | POA: Insufficient documentation

## 2018-03-03 NOTE — Telephone Encounter (Signed)
That can stop the iron for about 3 days.

## 2018-03-03 NOTE — Telephone Encounter (Signed)
April Pittman, a PT from [facility] called to let Dr. Caryn Section know that the patient reports that she is having nausea and vomited earlier today. Patient feels that it may be due from the iron supplements that she is taking. She is also having dark, loose stools. He reports that the patient told him that her symptoms are intermittent, but did experience this today. FYI. Thanks!

## 2018-03-03 NOTE — Telephone Encounter (Signed)
There was no number left to call [place] back. Tried calling patient. Left message to call back.

## 2018-03-03 NOTE — Progress Notes (Signed)
Patient here for follow up. She states she has poor appetite because the food does not taste good. Pt on antibiotic for sepsis, will complete tomorrow.

## 2018-03-04 ENCOUNTER — Other Ambulatory Visit: Payer: Self-pay | Admitting: Oncology

## 2018-03-04 DIAGNOSIS — D509 Iron deficiency anemia, unspecified: Secondary | ICD-10-CM | POA: Insufficient documentation

## 2018-03-04 NOTE — Progress Notes (Signed)
Hematology/Oncology Consult note [facility] Telephone:([phone] [phone]   Patient Care Team: Birdie Sons, MD as PCP - General (Family Medicine) Defrancesco, Alanda Slim, MD as Consulting Physician (Obstetrics and Gynecology) Teodoro Spray, MD as Consulting Physician (Cardiology) Leandrew Koyanagi, MD as Referring Physician (Ophthalmology)  REFERRING PROVIDER: Dr.Sianani. CHIEF COMPLAINTS/REASON FOR VISIT:  Evaluation of iron deficiency anemia  HISTORY OF PRESENTING ILLNESS:  April Pittman is a  78 y.o.  female with PMH listed below who was referred to me for evaluation of iron deficiency anemia. Patient was recently admitted from 02/18/2018-02/22/2017 due to UTI/pyelonephritis, Sepsis. Treatment with broad spectrum of IV antibiotics with meropenem initially and then narrowed down to ceftriaxone. Urine culture positive for E coli. Also had acute respiratory failure with hypoxia, due to volume overload and pulmonary edema. Treated with IV lasix and responded well.   During the admission, she was found to have severe microcytic anemic, iron deficiency, received one dose of IV Venofer and was discharged with oral iron supplements. Referred to hematology for further evaluation.   Associated signs and symptoms: Patient reports fatigue.  Mild SOB with exertion.  Denies weight loss, easy bruising, hematochezia, hemoptysis, hematuria. Context:  Rectal bleeding: denies Menstrual bleeding/ Vaginal bleeding : deneis Hematemesis or hemoptysis : denies Blood in urine : denies  Last endoscopy: 09/28/2013 Colonoscopy showed internal hemorrhoids, no polyps. Recommend to repeat in 09/2018 due to polyps found on previous colonoscopy.  Fatigue: Yes.     Review of Systems  Constitutional: Positive for appetite change, fatigue and unexpected weight change. Negative for chills and fever.  HENT:   Negative for hearing loss and voice change.   Eyes: Negative  for eye problems.  Respiratory: Negative for chest tightness and cough.   Cardiovascular: Negative for chest pain.  Gastrointestinal: Negative for abdominal distention, abdominal pain, blood in stool, nausea, rectal pain and vomiting.  Endocrine: Negative for hot flashes.  Genitourinary: Negative for difficulty urinating and frequency.   Musculoskeletal: Negative for arthralgias.  Skin: Negative for itching and rash.  Neurological: Negative for extremity weakness.  Hematological: Negative for adenopathy.  Psychiatric/Behavioral: Negative for confusion.    MEDICAL HISTORY:  Past Medical History:  Diagnosis Date  . Alopecia   . Anginal pain ([place])   . Benign neoplasm of large bowel   . CAD (coronary artery disease)   . Carpal tunnel syndrome   . Complication of anesthesia    vomits with any pain medicine  . DM (diabetes mellitus) ([place])   . Erosion of cervix   . GERD (gastroesophageal reflux disease)   . H/O adenomatous polyp of colon    with sever dysplasia, removed 2000 by colonoscopy  . History of chicken pox   . Hypercholesteremia   . Hypertension   . Hypothyroid   . Irritable bowel   . Malignant melanoma of foot ([place])   . Osteoporosis   . PONV (postoperative nausea and vomiting)   . Post-menopausal bleeding   . Procidentia of uterus    with traction cystocele, managed with gellhorn pessary  . Rectal bleeding   . TIA (transient ischemic attack)     SURGICAL HISTORY: Past Surgical History:  Procedure Laterality Date  . BREAST EXCISIONAL BIOPSY Right    Benign  . CARDIAC CATHETERIZATION  10/2010   +stenting to LAD and RCA; symptoms: chest pain, elevated Troponin, +AMI. [employer]  . CATARACT EXTRACTION W/PHACO Right 07/28/2016   Procedure: CATARACT EXTRACTION PHACO AND INTRAOCULAR LENS PLACEMENT (IOC)  Right  Diabetic;  Surgeon: Leandrew Koyanagi, MD;  Location: [facility];  Service: Ophthalmology;  Laterality: Right;  Diabetic leave patient at 9 arrival   . CATARACT EXTRACTION W/PHACO Left 10/27/2016   Procedure: CATARACT EXTRACTION PHACO AND INTRAOCULAR LENS PLACEMENT ([place]) LEFT DIABETIC;  Surgeon: Leandrew Koyanagi, MD;  Location: [facility];  Service: Ophthalmology;  Laterality: Left;  . COLONOSCOPY  03/2008   2 polyps, internal and external hemorrhoids, +anal growth. Dr. Vira Agar  . KNEE SURGERY    . TONSILLECTOMY     as a child    SOCIAL HISTORY: Social History   Socioeconomic History  . Marital status: Married    Spouse name: Not on file  . Number of children: 3  . Years of education: Not on file  . Highest education level: GED or equivalent  Occupational History  . Occupation: Retired    Comment: former Counselling psychologist  . Financial resource strain: Not hard at all  . Food insecurity:    Worry: Never true    Inability: Never true  . Transportation needs:    Medical: No    Non-medical: No  Tobacco Use  . Smoking status: Former Smoker    Last attempt to quit: 03/03/1988    Years since quitting: 30.0  . Smokeless tobacco: Never Used  Substance and Sexual Activity  . Alcohol use: No    Alcohol/week: 0.0 standard drinks  . Drug use: No  . Sexual activity: Not Currently  Lifestyle  . Physical activity:    Days per week: Not on file    Minutes per session: Not on file  . Stress: To some extent  Relationships  . Social connections:    Talks on phone: Not on file    Gets together: Not on file    Attends religious service: Not on file    Active member of club or organization: Not on file    Attends meetings of clubs or organizations: Not on file    Relationship status: Not on file  . Intimate partner violence:    Fear of current or ex partner: Not on file    Emotionally abused: Not on file    Physically abused: Not on file    Forced sexual activity: Not on file  Other Topics Concern  . Not on file  Social History Narrative  . Not on file    FAMILY HISTORY: Family History  Problem  Relation Age of Onset  . Lung cancer Brother   . Diabetes Father   . Heart disease Sister        Valvular rheumatic heart disease  . Rheumatic fever Sister   . Heart disease Paternal Grandmother   . Breast cancer Neg Hx   . Ovarian cancer Neg Hx   . Colon cancer Neg Hx     ALLERGIES:  has No Known Allergies.  MEDICATIONS:  Current Outpatient Medications  Medication Sig Dispense Refill  . alendronate (FOSAMAX) 70 MG tablet Take 1 tablet (70 mg total) by mouth every 7 (seven) days. Take with a full glass of water on an empty stomach. 12 tablet 4  . amLODipine (NORVASC) 5 MG tablet TAKE 1 TABLET (5 MG TOTAL) BY MOUTH DAILY. 30 tablet 12  . aspirin EC 81 MG tablet Take 81 mg by mouth daily.    . calcium carbonate (OS-CAL) 1250 (500 Ca) MG chewable tablet Chew 1 tablet by mouth 2 (two) times daily.    . ciprofloxacin (CIPRO) 500 MG tablet Take  1 tablet (500 mg total) by mouth 2 (two) times daily for 10 days. 20 tablet 0  . clopidogrel (PLAVIX) 75 MG tablet Take 75 mg by mouth daily.    . ferrous sulfate 325 (65 FE) MG tablet Take 1 tablet (325 mg total) by mouth 2 (two) times daily with a meal. 60 tablet 1  . glucose blood (ONE TOUCH ULTRA TEST) test strip USE TO CHECK BLOOD SUGAR DAILY. E 11.9 (TYPE 2 DIABETES MELLITIS) 100 each 4  . levothyroxine (SYNTHROID, LEVOTHROID) 125 MCG tablet TAKE 1 TABLET BY MOUTH IN THE MORNING ON AN EMPTY STOMACH FOR THYROID 90 tablet 3  . lisinopril-hydrochlorothiazide (PRINZIDE,ZESTORETIC) 20-25 MG tablet Take 1 tablet by mouth daily. 90 tablet 3  . metFORMIN (GLUCOPHAGE) 1000 MG tablet Take 1 tablet (1,000 mg total) by mouth 2 (two) times daily. 180 tablet 3  . metoprolol (LOPRESSOR) 50 MG tablet Take 50 mg by mouth 2 (two) times daily.     . Multiple Vitamins-Minerals (PRESERVISION AREDS 2 PO) Take 2 capsules by mouth daily.    . simvastatin (ZOCOR) 40 MG tablet TAKE 1 TABLET BY MOUTH EVERY DAY FOR CHOLESTEROL 30 tablet 12  . vitamin B-12 (CYANOCOBALAMIN)  1000 MCG tablet Take 1,000 mcg by mouth daily.     Marland Kitchen estradiol (ESTRACE) 0.1 MG/GM vaginal cream Place 1 Applicatorful vaginally once a week.    . nitroGLYCERIN (NITROSTAT) 0.4 MG SL tablet Place 1 tablet (0.4 mg total) under the tongue every 5 (five) minutes as needed for chest pain. (Patient not taking: Reported on 03/03/2018) 30 tablet 0   No current facility-administered medications for this visit.      PHYSICAL EXAMINATION: ECOG PERFORMANCE STATUS: 2 - Symptomatic, <50% confined to bed Vitals:   03/03/18 1458  BP: 103/60  Pulse: 68  Resp: 18  Temp: (!) 96.7 F (35.9 C)  SpO2: 99%   Filed Weights   03/03/18 1458  Weight: 139 lb 4.8 oz (63.2 kg)    Physical Exam Constitutional:      General: She is not in acute distress.    Appearance: She is ill-appearing.  HENT:     Head: Normocephalic and atraumatic.  Eyes:     General: No scleral icterus.    Pupils: Pupils are equal, round, and reactive to light.  Neck:     Musculoskeletal: Normal range of motion and neck supple.  Cardiovascular:     Rate and Rhythm: Normal rate and regular rhythm.     Heart sounds: Normal heart sounds.  Pulmonary:     Effort: Pulmonary effort is normal. No respiratory distress.     Breath sounds: No wheezing.  Abdominal:     General: Bowel sounds are normal. There is no distension.     Palpations: Abdomen is soft. There is no mass.     Tenderness: There is no abdominal tenderness.  Musculoskeletal: Normal range of motion.        General: No deformity.  Skin:    General: Skin is warm and dry.     Coloration: Skin is pale.     Findings: No erythema or rash.  Neurological:     Mental Status: She is alert and oriented to person, place, and time.     Cranial Nerves: No cranial nerve deficit.     Coordination: Coordination normal.  Psychiatric:        Behavior: Behavior normal.        Thought Content: Thought content normal.      LABORATORY DATA:  I have reviewed the data as listed Lab  Results  Component Value Date   WBC 12.4 (H) 02/24/2018   HGB 8.7 (L) 02/24/2018   HCT 28.6 (L) 02/24/2018   MCV 73 (L) 02/24/2018   PLT 547 (H) 02/24/2018   Recent Labs    05/11/17 1145 02/18/18 1032  02/21/18 0509 02/22/18 0309 02/24/18 1126  NA 141 132*   < > 136 137 138  K 3.7 2.9*   < > 4.0 3.8 4.0  CL 98 95*   < > 104 105 100  CO2 26 23   < > 26 24 23   GLUCOSE 141* 255*   < > 178* 153* 138*  BUN 11 24*   < > 30* 26* 14  CREATININE 0.60 1.42*   < > 1.22* 1.16* 0.89  CALCIUM 9.2 8.2*   < > 8.2* 8.2* 9.6  GFRNONAA 89 36*   < > 43* 45* 63  GFRAA 102 41*   < > 49* 53* 72  PROT  --  7.1  --   --   --   --   ALBUMIN 4.2 3.2*  --   --   --  3.4*  AST  --  28  --   --   --   --   ALT  --  11  --   --   --   --   ALKPHOS  --  99  --   --   --   --   BILITOT  --  0.9  --   --   --   --    < > = values in this interval not displayed.   Iron/TIBC/Ferritin/ %Sat    Component Value Date/Time   IRON <5 (L) 02/19/2018 0443   TIBC 294 02/19/2018 0443   FERRITIN 61 02/19/2018 0443   IRONPCTSAT NOT CALCULATED 02/19/2018 0443     Ct Abdomen Pelvis Wo Contrast  Result Date: 02/18/2018 CLINICAL DATA:  Abdominal pain with nausea, vomiting, and diarrhea. EXAM: CT ABDOMEN AND PELVIS WITHOUT CONTRAST TECHNIQUE: Multidetector CT imaging of the abdomen and pelvis was performed following the standard protocol without IV contrast. COMPARISON:  None. FINDINGS: Lower chest: Moderate hiatal hernia. Heart size is normal. Hepatobiliary: Slight hepatomegaly. Increased density in the dependent portion of the gallbladder consistent with gallstones. Gallbladder wall is not thickened. No dilated bile ducts. Pancreas: Unremarkable. No pancreatic ductal dilatation or surrounding inflammatory changes. Spleen: Normal in size without focal abnormality. Adrenals/Urinary Tract: Adrenal glands are normal. There is slight soft tissue stranding in the perinephric space bilaterally, left more than right. There is a  tiny amount of fluid in the right pericolic gutter. No stones or hydronephrosis. No visible renal masses. Stomach/Bowel: Moderate hiatal hernia. Scattered diverticula in the distal colon. The bowel otherwise appears normal. Vascular/Lymphatic: Aortic atherosclerosis. No enlarged abdominal or pelvic lymph nodes. Reproductive: Uterus and bilateral adnexa are unremarkable. Pessary in place. Other: No ascites in the pelvis. No hernias. Musculoskeletal: No acute or significant osseous findings. IMPRESSION: 1. Slight soft tissue stranding in the perinephric space bilaterally with a tiny amount of fluid in the right pericolic gutter. The possibility of pyelonephritis should be considered. 2. Cholelithiasis. 3. Moderate hiatal hernia. Aortic Atherosclerosis (ICD10-I70.0). Electronically Signed   By: Lorriane Shire M.D.   On: 02/18/2018 11:39   Dg Chest 1 View  Result Date: 02/19/2018 CLINICAL DATA:  78 year old undergoing fluid resuscitation for sepsis, now with acute onset of cough and hypoxemia requiring oxygen therapy. EXAM:  Portable CHEST 1 VIEW 4:56 p.m.: COMPARISON:  Chest x-ray earlier same day 2:48 a.m. and previously. FINDINGS: Since the examination earlier today, the patient has developed moderate diffuse interstitial pulmonary edema. Cardiac silhouette moderately enlarged. No confluent airspace consolidation. Possible small BILATERAL pleural effusions. IMPRESSION: Moderate acute CHF and/or fluid overload, with moderate cardiomegaly and moderate diffuse interstitial pulmonary edema. Electronically Signed   By: Evangeline Dakin M.D.   On: 02/19/2018 17:29   Dg Chest 2 View  Result Date: 02/18/2018 CLINICAL DATA:  Fever. Nausea, vomiting, and diarrhea. EXAM: CHEST - 2 VIEW COMPARISON:  04/01/2008 FINDINGS: The heart size is normal. There is slight pulmonary vascular prominence. Slight atelectasis at the left lung base. No consolidative infiltrates or effusions. No acute bone abnormality. IMPRESSION: Slight  pulmonary vascular prominence with slight atelectasis at the left lung base. Electronically Signed   By: Lorriane Shire M.D.   On: 02/18/2018 11:32   Dg Abd 1 View  Result Date: 02/21/2018 CLINICAL DATA:  Abdominal distension EXAM: ABDOMEN - 1 VIEW COMPARISON:  CT abdomen and pelvis February 18, 2018 FINDINGS: There is no bowel dilatation or air-fluid level to suggest bowel obstruction. No free air. There is mild stool in the colon. Lung bases are clear. There is a pessary in the lower pelvis. IMPRESSION: No bowel obstruction or free air. Lung bases clear. Pessary in lower pelvis. Electronically Signed   By: Lowella Grip III M.D.   On: 02/21/2018 09:53   Dg Chest Port 1 View  Result Date: 02/19/2018 CLINICAL DATA:  Acute onset of shortness of breath and cough. EXAM: PORTABLE CHEST 1 VIEW COMPARISON:  Chest radiograph performed 02/18/2018 FINDINGS: Increased interstitial markings are noted bilaterally, with mild left basilar opacity. This may reflect mild interstitial edema. No definite pleural effusion or pneumothorax is seen. The cardiomediastinal silhouette is mildly enlarged. No acute osseous abnormalities IMPRESSION: Increased interstitial markings, with mild left basilar airspace opacity. This may reflect mild interstitial edema. Mild cardiomegaly noted. Electronically Signed   By: Garald Balding M.D.   On: 02/19/2018 03:03     ASSESSMENT & PLAN:  1. Other iron deficiency anemia   2. Thrombocytosis (HCC)   3. Borderline blood pressure    # Labs are reviewed and discussed with patient. Consistent with iron deficiency anemia. Plan IV iron with Venofer 200mg  weekly x 3 doses. Allergy reactions/infusion reaction including anaphylactic reaction discussed with patient. Other side effects include but not limited to high blood pressure, skin rash, weight gain, leg swelling, etc. Patient voices understanding and willing to proceed. # Thrombocytosis, likely reactive, due to iron deficiency and  recent infection.  # Refer to GI for further evaluation for iron deficiency anemia. # borderline low blood pressure. Initial systolic BP in clinic was in 90s. Repeat BP showed 103/60.  On amlodipine, metoprolol, and lisinopril and hydrochlorothiazide.  I recommend patient to increase oral hydration. Monitor blood pressure at home and hold medication if systolic blood pressure is lower than 100. Daughter voices understanding.   RTC in 6 weeks for MD assessment with repeat labs cbc, iron TIBC, ferritin.   Orders Placed This Encounter  Procedures  . Iron and TIBC    Standing Status:   Future    Standing Expiration Date:   03/04/2019  . CBC with Differential/Platelet    Standing Status:   Future    Standing Expiration Date:   03/04/2019  . Ferritin    Standing Status:   Future    Standing Expiration Date:   03/04/2019  .  Ambulatory referral to Gastroenterology    Referral Priority:   Routine    Referral Type:   Consultation    Referral Reason:   Specialty Services Required    Referred to Provider:   Manya Silvas, MD    Number of Visits Requested:   1    All questions were answered. The patient knows to call the clinic with any problems questions or concerns.  Return of visit: 6 weeks. Thank you for this kind referral and the opportunity to participate in the care of this patient. A copy of today's note is routed to referring provider  Total face to face encounter time for this patient visit was 94min. >50% of the time was  spent in counseling and coordination of care.    Earlie Server, MD, PhD Hematology Oncology [facility] Pager- [phone] 03/04/2018

## 2018-03-04 NOTE — Addendum Note (Signed)
Addended by: Earlie Server on: 03/04/2018 01:18 AM   Modules accepted: Orders

## 2018-03-05 NOTE — Telephone Encounter (Signed)
That's fine

## 2018-03-07 ENCOUNTER — Telehealth: Payer: Self-pay

## 2018-03-07 NOTE — Telephone Encounter (Signed)
April Pittman from [place].  [phone]  BP 80/52 and then 98/58  today Seen by Hematology Friday 103/60, they told her on friday to stop all BP meds but she wasn't comfortable with that and only stopped the Norvasc.    Please let April Pittman know if you want anything else done

## 2018-03-08 NOTE — Telephone Encounter (Signed)
He BP is usually higher than that. OK to stop amlodipine, but she should continue all other medications.

## 2018-03-08 NOTE — Telephone Encounter (Signed)
Left message with Varney Biles from [place].  Thanks,   -Mickel Baas

## 2018-03-08 NOTE — Telephone Encounter (Signed)
Left message advising Varney Biles.  Thanks,   -Mickel Baas

## 2018-03-09 ENCOUNTER — Inpatient Hospital Stay: Payer: PPO

## 2018-03-09 VITALS — BP 136/68 | HR 69 | Temp 98.3°F | Resp 18

## 2018-03-09 DIAGNOSIS — D509 Iron deficiency anemia, unspecified: Secondary | ICD-10-CM

## 2018-03-09 DIAGNOSIS — E119 Type 2 diabetes mellitus without complications: Secondary | ICD-10-CM | POA: Diagnosis not present

## 2018-03-09 DIAGNOSIS — D508 Other iron deficiency anemias: Secondary | ICD-10-CM | POA: Diagnosis not present

## 2018-03-09 DIAGNOSIS — K219 Gastro-esophageal reflux disease without esophagitis: Secondary | ICD-10-CM | POA: Diagnosis not present

## 2018-03-09 DIAGNOSIS — Z86010 Personal history of colonic polyps: Secondary | ICD-10-CM | POA: Diagnosis not present

## 2018-03-09 DIAGNOSIS — I2510 Atherosclerotic heart disease of native coronary artery without angina pectoris: Secondary | ICD-10-CM | POA: Diagnosis not present

## 2018-03-09 MED ORDER — IRON SUCROSE 20 MG/ML IV SOLN
200.0000 mg | Freq: Once | INTRAVENOUS | Status: AC
  Administered 2018-03-09: 200 mg via INTRAVENOUS
  Filled 2018-03-09: qty 10

## 2018-03-09 MED ORDER — SODIUM CHLORIDE 0.9 % IV SOLN: 200.0000 mg | Freq: Once | INTRAVENOUS | Status: DC

## 2018-03-09 MED ORDER — SODIUM CHLORIDE 0.9 % IV SOLN
Freq: Once | INTRAVENOUS | Status: AC
  Administered 2018-03-09: 14:00:00 via INTRAVENOUS
  Filled 2018-03-09: qty 250

## 2018-03-10 ENCOUNTER — Telehealth: Payer: Self-pay | Admitting: Family Medicine

## 2018-03-10 NOTE — Telephone Encounter (Signed)
Joses, PT Assistant - w/ [facility] [phone]  Report on pt for today's visit:   6 out of 10 - Rt back side of knee pain Abnormal lung sounds - bubbling 275 - Blood Sugar level  Thanks, [place]

## 2018-03-10 NOTE — Telephone Encounter (Signed)
Spoke with pt. She denies fevers, chills, diarrhea, she is not coughing or short of breath.  Overall she feels well.  I advised her we could need to see her in the office if she has any of the above symptoms.   Thanks,   -Mickel Baas

## 2018-03-10 NOTE — Telephone Encounter (Signed)
Please review. Would the labs that were just recently drawn show that infection has cleared?

## 2018-03-10 NOTE — Telephone Encounter (Signed)
Wants to discuss labs that was recently done on her. Wanting to make sure her GI infection has cleared up and if the latest labs done by Caryn Section would show that.  Please advise.  Thanks, [employer]

## 2018-03-10 NOTE — Telephone Encounter (Signed)
Labs can't tell if she has a GI infection, have to go be symptoms. If she is no longer having diarrhea or fever then infection is gone.   We did get call from PT about lungs sounding 'bubbly' has she had any trouble with cough or shortness of breath?

## 2018-03-10 NOTE — Telephone Encounter (Signed)
Please review. Thanks!  

## 2018-03-13 ENCOUNTER — Telehealth: Payer: Self-pay | Admitting: Family Medicine

## 2018-03-13 NOTE — Telephone Encounter (Signed)
OK for u/a

## 2018-03-13 NOTE — Telephone Encounter (Signed)
[facility] w/ [place] [phone]  Verbal Orders:  Pt has concerns that her UTI  Has not cleared up.  Asking if Dr. Caryn Section will let her do a UA test?  Please advise.  Thanks, [employer]

## 2018-03-13 NOTE — Telephone Encounter (Signed)
Varney Biles advised.   Thanks,   -Mickel Baas

## 2018-03-14 ENCOUNTER — Telehealth: Payer: Self-pay | Admitting: Family Medicine

## 2018-03-14 DIAGNOSIS — N390 Urinary tract infection, site not specified: Secondary | ICD-10-CM | POA: Diagnosis not present

## 2018-03-14 NOTE — Telephone Encounter (Signed)
Please review

## 2018-03-14 NOTE — Telephone Encounter (Signed)
Varney Biles, LPN w/ [facility] - [phone]  Wanted to let Dr. Caryn Section know Pt was Nauesated - vomiting  Pt has made appt for next Fri. - needing to get transportation.  Thanks, [employer]

## 2018-03-15 ENCOUNTER — Inpatient Hospital Stay: Payer: PPO

## 2018-03-15 VITALS — BP 115/67 | HR 67 | Temp 98.7°F | Resp 18 | Wt 137.6 lb

## 2018-03-15 DIAGNOSIS — D509 Iron deficiency anemia, unspecified: Secondary | ICD-10-CM

## 2018-03-15 DIAGNOSIS — D508 Other iron deficiency anemias: Secondary | ICD-10-CM | POA: Diagnosis not present

## 2018-03-15 MED ORDER — SODIUM CHLORIDE 0.9 % IV SOLN
Freq: Once | INTRAVENOUS | Status: AC
  Administered 2018-03-15: 14:00:00 via INTRAVENOUS
  Filled 2018-03-15: qty 250

## 2018-03-15 MED ORDER — IRON SUCROSE 20 MG/ML IV SOLN
200.0000 mg | Freq: Once | INTRAVENOUS | Status: AC
  Administered 2018-03-15: 200 mg via INTRAVENOUS
  Filled 2018-03-15: qty 10

## 2018-03-15 NOTE — Telephone Encounter (Signed)
Spoke with [facility] with [place], she states Shene did not vomit yesterday but did on Monday 03/13/2018.  Pt told Varney Biles she feels "Like I have a cancer inside of me."  Varney Biles did get a U/A she states the results are not it yet.  She also tried to encourage the pt to make an appointment with Dr. Caryn Section sooner that 03/24/2018, but Ms. Manganello refused stating she had an eye exam coming up soon.    I tried calling Mrs Huebsch and left her a message to call me back.    Thanks,   -Mickel Baas

## 2018-03-15 NOTE — Telephone Encounter (Signed)
If she has vomiting that last over 24 hours then she needs to be seen. May have recurrence of pyelonephritis or kidney disease.  Home health can check u/a if she has trouble getting into office.

## 2018-03-16 ENCOUNTER — Inpatient Hospital Stay: Payer: PPO

## 2018-03-16 ENCOUNTER — Telehealth: Payer: Self-pay | Admitting: Family Medicine

## 2018-03-16 NOTE — Telephone Encounter (Signed)
Pt returned Laura's missed call. Please call pt back.  Thanks, [employer]

## 2018-03-16 NOTE — Telephone Encounter (Signed)
Patient returned Laura's call. Patient states she has not had anymore episodes of nausea or vomiting since Tuesday (2 days ago). She says she thinks the mint flavor from her toothpaste made her nauseous. Patient confirmed with me that the [facility] collected a urine sample from her, and is supposed to be calling patient with the results when they come back. Patient states she feels fine today.

## 2018-03-17 NOTE — Telephone Encounter (Signed)
Urine culture results came back and are negative.

## 2018-03-20 DIAGNOSIS — H353221 Exudative age-related macular degeneration, left eye, with active choroidal neovascularization: Secondary | ICD-10-CM | POA: Diagnosis not present

## 2018-03-20 DIAGNOSIS — H353211 Exudative age-related macular degeneration, right eye, with active choroidal neovascularization: Secondary | ICD-10-CM | POA: Diagnosis not present

## 2018-03-23 ENCOUNTER — Inpatient Hospital Stay: Payer: PPO

## 2018-03-23 VITALS — BP 108/57

## 2018-03-23 DIAGNOSIS — D509 Iron deficiency anemia, unspecified: Secondary | ICD-10-CM

## 2018-03-23 DIAGNOSIS — D508 Other iron deficiency anemias: Secondary | ICD-10-CM | POA: Diagnosis not present

## 2018-03-23 MED ORDER — IRON SUCROSE 20 MG/ML IV SOLN
200.0000 mg | Freq: Once | INTRAVENOUS | Status: AC
  Administered 2018-03-23: 200 mg via INTRAVENOUS
  Filled 2018-03-23: qty 10

## 2018-03-23 MED ORDER — SODIUM CHLORIDE 0.9 % IV SOLN
Freq: Once | INTRAVENOUS | Status: AC
  Administered 2018-03-23: 14:00:00 via INTRAVENOUS
  Filled 2018-03-23: qty 250

## 2018-03-24 ENCOUNTER — Encounter: Payer: Self-pay | Admitting: Family Medicine

## 2018-03-24 ENCOUNTER — Ambulatory Visit (INDEPENDENT_AMBULATORY_CARE_PROVIDER_SITE_OTHER): Payer: PPO | Admitting: Family Medicine

## 2018-03-24 VITALS — BP 114/60 | HR 76 | Temp 98.1°F | Wt 137.0 lb

## 2018-03-24 DIAGNOSIS — K449 Diaphragmatic hernia without obstruction or gangrene: Secondary | ICD-10-CM

## 2018-03-24 DIAGNOSIS — R112 Nausea with vomiting, unspecified: Secondary | ICD-10-CM | POA: Diagnosis not present

## 2018-03-24 DIAGNOSIS — K8080 Other cholelithiasis without obstruction: Secondary | ICD-10-CM

## 2018-03-24 DIAGNOSIS — K8020 Calculus of gallbladder without cholecystitis without obstruction: Secondary | ICD-10-CM | POA: Insufficient documentation

## 2018-03-24 DIAGNOSIS — M810 Age-related osteoporosis without current pathological fracture: Secondary | ICD-10-CM | POA: Diagnosis not present

## 2018-03-24 DIAGNOSIS — I10 Essential (primary) hypertension: Secondary | ICD-10-CM | POA: Diagnosis not present

## 2018-03-24 MED ORDER — METFORMIN HCL 1000 MG PO TABS: 1000.0000 mg | ORAL_TABLET | Freq: Every day | ORAL | 3 refills | Status: DC

## 2018-03-24 NOTE — Patient Instructions (Addendum)
.   Please review the attached list of medications and notify my office if there are any errors.   . Please bring all of your medications to every appointment so we can make sure that our medication list is the same as yours.    Stay off of alendronate (Fosamax) for the time being   Reduce lisinopril-hctz to 1/2 tablet every day   Reduce metformin to 1 tablet every evening (do not take in the morning anymore)   Stop taking aspirin, but continue taking clopidogrel (Plavix)

## 2018-03-24 NOTE — Progress Notes (Signed)
Patient: April Pittman Female    DOB: 1940-11-04   78 y.o.   MRN: [id number] Visit Date: 03/24/2018  Today's Provider: [name], MD   Chief Complaint  Patient presents with  . Nausea   Subjective:     Emesis   This is a recurrent problem. The current episode started more than 1 month ago. The problem occurs intermittently. The problem has been unchanged. There has been no fever. Associated symptoms include abdominal pain, diarrhea, headaches and weight loss. Pertinent negatives include no arthralgias, chest pain, chills, coughing, dizziness, fever, myalgias, sweats or URI.  She was seen by GI on 03/09/2018 for iron deficiency anemia and is scheduled for EGD. Was started on pantoprazole at that time.   Patient was called on 03/14/2018 and at that time she stated that nausea had resolved and she thought it was related to mint flavored toothpaste. However has had intermittent since then, last emesis was 4 days ago.   She was hospitalized in December for pyelonephritis and had CT remarkable for moderate hiatal hernia and cholelithiasis. Pyelo has completely resolved but continues to be recurrently nauseated as above.    Wt Readings from Last 5 Encounters:  03/15/18 137 lb 9.6 oz (62.4 kg)  03/03/18 139 lb 4.8 oz (63.2 kg)  02/24/18 148 lb (67.1 kg)  02/18/18 147 lb 6.4 oz (66.9 kg)  12/15/17 146 lb (66.2 kg)     Hypertension, follow-up:  BP Readings from Last 3 Encounters:  03/23/18 (!) 108/57  03/15/18 115/67  03/09/18 136/68    She was last seen for hypertension 3 weeks ago.  BP at that visit was 128/60. Management since that visit includes Stopped Amlodipine 03/07/2018 secondary to low blood pressure of 80/52 reported by home health nurse.  BP at hematology yesterday was 108/57 off of amlodipine.      Weight trend: decreasing steadily Wt Readings from Last 3 Encounters:  03/15/18 137 lb 9.6 oz (62.4 kg)  03/03/18 139 lb 4.8 oz (63.2 kg)  02/24/18 148 lb  (67.1 kg)     ------------------------------------------------------------------------    No Known Allergies   Current Outpatient Medications:  .  lisinopril-hydrochlorothiazide (PRINZIDE,ZESTORETIC) 20-25 MG tablet, Take 1 tablet by mouth daily., Disp: 90 tablet, Rfl: 3 .  metoprolol (LOPRESSOR) 50 MG tablet, Take 50 mg by mouth 2 (two) times daily. , Disp: , Rfl:  .  pantoprazole (PROTONIX) 40 MG tablet, Take by mouth., Disp: , Rfl:  .  alendronate (FOSAMAX) 70 MG tablet, Take 1 tablet (70 mg total) by mouth every 7 (seven) days. Take with a full glass of water on an empty stomach., Disp: 12 tablet, Rfl: 4 (PATIENT NOT TAKING) .  amLODipine (NORVASC) 5 MG tablet, TAKE 1 TABLET (5 MG TOTAL) BY MOUTH DAILY. (Patient not taking: Reported on 03/24/2018), Disp: 30 tablet, Rfl: 12 .  aspirin EC 81 MG tablet, Take 81 mg by mouth daily., Disp: , Rfl:  .  calcium carbonate (OS-CAL) 1250 (500 Ca) MG chewable tablet, Chew 1 tablet by mouth 2 (two) times daily., Disp: , Rfl:  .  clopidogrel (PLAVIX) 75 MG tablet, Take 75 mg by mouth daily., Disp: , Rfl:  .  estradiol (ESTRACE) 0.1 MG/GM vaginal cream, Place 1 Applicatorful vaginally once a week., Disp: , Rfl:  .  ferrous sulfate 325 (65 FE) MG tablet, Take 1 tablet (325 mg total) by mouth 2 (two) times daily with a meal., Disp: 60 tablet, Rfl: 1 .  glucose blood (  ONE TOUCH ULTRA TEST) test strip, USE TO CHECK BLOOD SUGAR DAILY. E 11.9 (TYPE 2 DIABETES MELLITIS), Disp: 100 each, Rfl: 4 .  levothyroxine (SYNTHROID, LEVOTHROID) 125 MCG tablet, TAKE 1 TABLET BY MOUTH IN THE MORNING ON AN EMPTY STOMACH FOR THYROID, Disp: 90 tablet, Rfl: 3 .  metFORMIN (GLUCOPHAGE) 1000 MG tablet, Take 1 tablet (1,000 mg total) by mouth 2 (two) times daily., Disp: 180 tablet, Rfl: 3 .  Multiple Vitamins-Minerals (PRESERVISION AREDS 2 PO), Take 2 capsules by mouth daily., Disp: , Rfl:  .  nitroGLYCERIN (NITROSTAT) 0.4 MG SL tablet, Place 1 tablet (0.4 mg total) under the  tongue every 5 (five) minutes as needed for chest pain. (Patient not taking: Reported on 03/03/2018), Disp: 30 tablet, Rfl: 0 .  simvastatin (ZOCOR) 40 MG tablet, TAKE 1 TABLET BY MOUTH EVERY DAY FOR CHOLESTEROL, Disp: 30 tablet, Rfl: 12 .  vitamin B-12 (CYANOCOBALAMIN) 1000 MCG tablet, Take 1,000 mcg by mouth daily. , Disp: , Rfl:   Review of Systems  Constitutional: Positive for appetite change, unexpected weight change and weight loss. Negative for activity change, chills, diaphoresis, fatigue and fever.  Respiratory: Negative.  Negative for cough.   Cardiovascular: Negative.  Negative for chest pain.  Gastrointestinal: Positive for abdominal pain, diarrhea, nausea and vomiting. Negative for abdominal distention, anal bleeding, blood in stool, constipation and rectal pain.  Musculoskeletal: Negative for arthralgias and myalgias.  Neurological: Positive for headaches. Negative for dizziness and light-headedness.    Social History   Tobacco Use  . Smoking status: Former Smoker    Last attempt to quit: 03/03/1988    Years since quitting: 30.0  . Smokeless tobacco: Never Used  Substance Use Topics  . Alcohol use: No    Alcohol/week: 0.0 standard drinks      Objective:   BP 114/60 (BP Location: Right Arm, Patient Position: Sitting, Cuff Size: Normal)   Pulse 76   Temp 98.1 F (36.7 C) (Oral)   Wt 137 lb (62.1 kg)   BMI 24.27 kg/m    Physical Exam   General Appearance:    Alert, cooperative, no distress  Eyes:    PERRL, conjunctiva/corneas clear, EOM's intact       Lungs:     Clear to auscultation bilaterally, respirations unlabored  Heart:    Regular rate and rhythm  Neurologic:   Awake, alert, oriented x 3. No apparent focal neurological           defect.          Assessment & Plan    1. Non-intractable vomiting with nausea, unspecified vomiting type Extensively counseled on long differential diagnosis of chronic nausea and that current episode is likely  multi-factorial. Seems to have been precipitated by episode of pyelo in December, but contributing likely include moderate sized hiatal hernia, GERD, several medications and possibly presence of gallstones. She reports she had already stopped alendronate a few months ago. Will also reduce metformin and d/c ECASA as she has been on dual antiplatelet therapy for a few years. continue clopidogrel. Follow up EGD as already planned by Dr. Tiffany Kocher.   2. Essential hypertension Has been hypotensive and now off of amlodipine. Is to educe lisinopril-hctz. And recheck BP in 4 weeks.   3. Hiatal hernia Likely contributing to GERD and nausea.   4. Biliary calculus of other site without obstruction Unclear if contributing nausea, but no sign of cholecystitis on CT last months.   5. Osteoporosis Stay off of alendronate as above due to  GERD. Consider referral for Prolia after multiple  acute issues are controlled.   Addressed extensive list of chronic and acute medical problems today requiring extensive time in counseling and coordination of care.  Over half of this 45 minute visit were spent in counseling and coordinating care of multiple medical problems.      [name], MD  [facility]

## 2018-04-12 ENCOUNTER — Inpatient Hospital Stay: Payer: PPO | Attending: Oncology

## 2018-04-12 DIAGNOSIS — Z7984 Long term (current) use of oral hypoglycemic drugs: Secondary | ICD-10-CM | POA: Insufficient documentation

## 2018-04-12 DIAGNOSIS — Z85820 Personal history of malignant melanoma of skin: Secondary | ICD-10-CM | POA: Diagnosis not present

## 2018-04-12 DIAGNOSIS — I10 Essential (primary) hypertension: Secondary | ICD-10-CM | POA: Insufficient documentation

## 2018-04-12 DIAGNOSIS — D473 Essential (hemorrhagic) thrombocythemia: Secondary | ICD-10-CM | POA: Diagnosis not present

## 2018-04-12 DIAGNOSIS — Z8673 Personal history of transient ischemic attack (TIA), and cerebral infarction without residual deficits: Secondary | ICD-10-CM | POA: Insufficient documentation

## 2018-04-12 DIAGNOSIS — D509 Iron deficiency anemia, unspecified: Secondary | ICD-10-CM | POA: Diagnosis not present

## 2018-04-12 DIAGNOSIS — K589 Irritable bowel syndrome without diarrhea: Secondary | ICD-10-CM | POA: Insufficient documentation

## 2018-04-12 DIAGNOSIS — D508 Other iron deficiency anemias: Secondary | ICD-10-CM

## 2018-04-12 DIAGNOSIS — K219 Gastro-esophageal reflux disease without esophagitis: Secondary | ICD-10-CM | POA: Insufficient documentation

## 2018-04-12 DIAGNOSIS — I2510 Atherosclerotic heart disease of native coronary artery without angina pectoris: Secondary | ICD-10-CM | POA: Insufficient documentation

## 2018-04-12 DIAGNOSIS — Z7902 Long term (current) use of antithrombotics/antiplatelets: Secondary | ICD-10-CM | POA: Diagnosis not present

## 2018-04-12 DIAGNOSIS — Z79899 Other long term (current) drug therapy: Secondary | ICD-10-CM | POA: Insufficient documentation

## 2018-04-12 DIAGNOSIS — E7800 Pure hypercholesterolemia, unspecified: Secondary | ICD-10-CM | POA: Diagnosis not present

## 2018-04-12 DIAGNOSIS — E039 Hypothyroidism, unspecified: Secondary | ICD-10-CM | POA: Insufficient documentation

## 2018-04-12 DIAGNOSIS — Z87891 Personal history of nicotine dependence: Secondary | ICD-10-CM | POA: Insufficient documentation

## 2018-04-12 DIAGNOSIS — E119 Type 2 diabetes mellitus without complications: Secondary | ICD-10-CM | POA: Diagnosis not present

## 2018-04-12 LAB — CBC WITH DIFFERENTIAL/PLATELET
ABS IMMATURE GRANULOCYTES: 0.02 10*3/uL (ref 0.00–0.07)
Basophils Absolute: 0.1 10*3/uL (ref 0.0–0.1)
Basophils Relative: 2 %
Eosinophils Absolute: 0.5 10*3/uL (ref 0.0–0.5)
Eosinophils Relative: 6 %
HCT: 35.9 % — ABNORMAL LOW (ref 36.0–46.0)
Hemoglobin: 11.4 g/dL — ABNORMAL LOW (ref 12.0–15.0)
Immature Granulocytes: 0 %
Lymphocytes Relative: 26 %
Lymphs Abs: 2.1 10*3/uL (ref 0.7–4.0)
MCH: 24.7 pg — ABNORMAL LOW (ref 26.0–34.0)
MCHC: 31.8 g/dL (ref 30.0–36.0)
MCV: 77.9 fL — AB (ref 80.0–100.0)
Monocytes Absolute: 0.8 10*3/uL (ref 0.1–1.0)
Monocytes Relative: 10 %
Neutro Abs: 4.7 10*3/uL (ref 1.7–7.7)
Neutrophils Relative %: 56 %
PLATELETS: 332 10*3/uL (ref 150–400)
RBC: 4.61 MIL/uL (ref 3.87–5.11)
RDW: 21.6 % — ABNORMAL HIGH (ref 11.5–15.5)
WBC: 8.2 10*3/uL (ref 4.0–10.5)
nRBC: 0 % (ref 0.0–0.2)

## 2018-04-12 LAB — IRON AND TIBC
Iron: 63 ug/dL (ref 28–170)
Saturation Ratios: 19 % (ref 10.4–31.8)
TIBC: 333 ug/dL (ref 250–450)
UIBC: 270 ug/dL

## 2018-04-12 LAB — FERRITIN: Ferritin: 162 ng/mL (ref 11–307)

## 2018-04-14 ENCOUNTER — Encounter: Payer: Self-pay | Admitting: Oncology

## 2018-04-14 ENCOUNTER — Other Ambulatory Visit: Payer: Self-pay

## 2018-04-14 ENCOUNTER — Inpatient Hospital Stay: Payer: PPO | Admitting: Oncology

## 2018-04-14 ENCOUNTER — Inpatient Hospital Stay: Payer: PPO

## 2018-04-14 VITALS — BP 100/61 | HR 64 | Temp 96.8°F | Ht 63.0 in | Wt 141.2 lb

## 2018-04-14 VITALS — BP 120/68 | HR 68 | Resp 18

## 2018-04-14 DIAGNOSIS — D509 Iron deficiency anemia, unspecified: Secondary | ICD-10-CM

## 2018-04-14 DIAGNOSIS — R030 Elevated blood-pressure reading, without diagnosis of hypertension: Secondary | ICD-10-CM

## 2018-04-14 DIAGNOSIS — I10 Essential (primary) hypertension: Secondary | ICD-10-CM

## 2018-04-14 DIAGNOSIS — D473 Essential (hemorrhagic) thrombocythemia: Secondary | ICD-10-CM | POA: Diagnosis not present

## 2018-04-14 DIAGNOSIS — Z79899 Other long term (current) drug therapy: Secondary | ICD-10-CM | POA: Diagnosis not present

## 2018-04-14 DIAGNOSIS — D75839 Thrombocytosis, unspecified: Secondary | ICD-10-CM | POA: Insufficient documentation

## 2018-04-14 DIAGNOSIS — K219 Gastro-esophageal reflux disease without esophagitis: Secondary | ICD-10-CM

## 2018-04-14 MED ORDER — SODIUM CHLORIDE 0.9 % IV SOLN
Freq: Once | INTRAVENOUS | Status: AC
  Administered 2018-04-14: 14:00:00 via INTRAVENOUS
  Filled 2018-04-14: qty 250

## 2018-04-14 MED ORDER — IRON SUCROSE 20 MG/ML IV SOLN
200.0000 mg | Freq: Once | INTRAVENOUS | Status: AC
  Administered 2018-04-14: 200 mg via INTRAVENOUS
  Filled 2018-04-14: qty 10

## 2018-04-14 NOTE — Progress Notes (Signed)
Hematology/Oncology Follow up note [facility] Telephone:([phone]) [phone] [phone]) [phone]   Patient Care Team: Birdie Sons, MD as PCP - General (Family Medicine) Defrancesco, Alanda Slim, MD as Consulting Physician (Obstetrics and Gynecology) Teodoro Spray, MD as Consulting Physician (Cardiology) Leandrew Koyanagi, MD as Referring Physician (Ophthalmology)  REFERRING PROVIDER: Dr.Sianani. REASON FOR VISIT:  Follow up for  iron deficiency anemia  HISTORY OF PRESENTING ILLNESS:  April Pittman is a  78 y.o.  female with PMH listed below who was referred to me for evaluation of iron deficiency anemia. Patient was recently admitted from 02/18/2018-02/22/2017 due to UTI/pyelonephritis, Sepsis. Treatment with broad spectrum of IV antibiotics with meropenem initially and then narrowed down to ceftriaxone. Urine culture positive for E coli. Also had acute respiratory failure with hypoxia, due to volume overload and pulmonary edema. Treated with IV lasix and responded well.   During the admission, she was found to have severe microcytic anemic, iron deficiency, received one dose of IV Venofer and was discharged with oral iron supplements. Referred to hematology for further evaluation.   Associated signs and symptoms: Patient reports fatigue.  Mild SOB with exertion.  Denies weight loss, easy bruising, hematochezia, hemoptysis, hematuria. Context:  Rectal bleeding: denies Menstrual bleeding/ Vaginal bleeding : deneis Hematemesis or hemoptysis : denies Blood in urine : denies  Last endoscopy: 09/28/2013 Colonoscopy showed internal hemorrhoids, no polyps. Recommend to repeat in 09/2018 due to polyps found on previous colonoscopy.  Fatigue: Yes.   INTERVAL HISTORY April Pittman is a 78 y.o. female who has above history reviewed by me today presents for follow up visit for management of iron deficiency anemia.  She has received 3 doses of IV Venofer. She also has  gained 4 pounds since last visit.  Fatigue has improved significantly.  She also has endoscopy and colonoscopy scheduled in March for further work up.    Patient's daughter asks if patient needs to be checked for CA 19.9 as one of her friend at church was diagnosed with pancreatic cancer.   Review of Systems  Constitutional: Negative for appetite change, chills, fatigue, fever and unexpected weight change.  HENT:   Negative for hearing loss and voice change.   Eyes: Negative for eye problems.  Respiratory: Negative for chest tightness and cough.   Cardiovascular: Negative for chest pain.  Gastrointestinal: Negative for abdominal distention, abdominal pain, blood in stool, nausea, rectal pain and vomiting.  Endocrine: Negative for hot flashes.  Genitourinary: Negative for difficulty urinating and frequency.   Musculoskeletal: Negative for arthralgias.  Skin: Negative for itching and rash.  Neurological: Negative for extremity weakness.  Hematological: Negative for adenopathy.  Psychiatric/Behavioral: Negative for confusion.    MEDICAL HISTORY:  Past Medical History:  Diagnosis Date  . Alopecia   . Anginal pain ([place])   . Benign neoplasm of large bowel   . CAD (coronary artery disease)   . Carpal tunnel syndrome   . Complication of anesthesia    vomits with any pain medicine  . DM (diabetes mellitus) ([place])   . Erosion of cervix   . GERD (gastroesophageal reflux disease)   . H/O adenomatous polyp of colon    with sever dysplasia, removed 2000 by colonoscopy  . History of chicken pox   . Hypercholesteremia   . Hypertension   . Hypothyroid   . Irritable bowel   . Malignant melanoma of foot ([place])   . Osteoporosis   . PONV (postoperative nausea and vomiting)   . Post-menopausal  bleeding   . Procidentia of uterus    with traction cystocele, managed with gellhorn pessary  . Rectal bleeding   . Sepsis ([place]) 02/18/2018  . TIA (transient ischemic attack)     SURGICAL  HISTORY: Past Surgical History:  Procedure Laterality Date  . BREAST EXCISIONAL BIOPSY Right    Benign  . CARDIAC CATHETERIZATION  10/2010   +stenting to LAD and RCA; symptoms: chest pain, elevated Troponin, +AMI. [employer]  . CATARACT EXTRACTION W/PHACO Right 07/28/2016   Procedure: CATARACT EXTRACTION PHACO AND INTRAOCULAR LENS PLACEMENT ([place])  Right Diabetic;  Surgeon: Leandrew Koyanagi, MD;  Location: [facility];  Service: Ophthalmology;  Laterality: Right;  Diabetic leave patient at 9 arrival  . CATARACT EXTRACTION W/PHACO Left 10/27/2016   Procedure: CATARACT EXTRACTION PHACO AND INTRAOCULAR LENS PLACEMENT ([place]) LEFT DIABETIC;  Surgeon: Leandrew Koyanagi, MD;  Location: [facility];  Service: Ophthalmology;  Laterality: Left;  . COLONOSCOPY  03/2008   2 polyps, internal and external hemorrhoids, +anal growth. Dr. Vira Agar  . KNEE SURGERY    . TONSILLECTOMY     as a child    SOCIAL HISTORY: Social History   Socioeconomic History  . Marital status: Married    Spouse name: Not on file  . Number of children: 3  . Years of education: Not on file  . Highest education level: GED or equivalent  Occupational History  . Occupation: Retired    Comment: former Counselling psychologist  . Financial resource strain: Not hard at all  . Food insecurity:    Worry: Never true    Inability: Never true  . Transportation needs:    Medical: No    Non-medical: No  Tobacco Use  . Smoking status: Former Smoker    Last attempt to quit: 03/03/1988    Years since quitting: 30.1  . Smokeless tobacco: Never Used  Substance and Sexual Activity  . Alcohol use: No    Alcohol/week: 0.0 standard drinks  . Drug use: No  . Sexual activity: Not Currently  Lifestyle  . Physical activity:    Days per week: Not on file    Minutes per session: Not on file  . Stress: To some extent  Relationships  . Social connections:    Talks on phone: Not on file    Gets together: Not  on file    Attends religious service: Not on file    Active member of club or organization: Not on file    Attends meetings of clubs or organizations: Not on file    Relationship status: Not on file  . Intimate partner violence:    Fear of current or ex partner: Not on file    Emotionally abused: Not on file    Physically abused: Not on file    Forced sexual activity: Not on file  Other Topics Concern  . Not on file  Social History Narrative  . Not on file    FAMILY HISTORY: Family History  Problem Relation Age of Onset  . Lung cancer Brother   . Diabetes Father   . Heart disease Sister        Valvular rheumatic heart disease  . Rheumatic fever Sister   . Heart disease Paternal Grandmother   . Breast cancer Neg Hx   . Ovarian cancer Neg Hx   . Colon cancer Neg Hx     ALLERGIES:  has No Known Allergies.  MEDICATIONS:  Current Outpatient Medications  Medication Sig Dispense Refill  .  calcium carbonate (OS-CAL) 1250 (500 Ca) MG chewable tablet Chew 1 tablet by mouth 2 (two) times daily.    . clopidogrel (PLAVIX) 75 MG tablet Take 75 mg by mouth daily.    Marland Kitchen estradiol (ESTRACE) 0.1 MG/GM vaginal cream Place 1 Applicatorful vaginally once a week.    . ferrous sulfate 325 (65 FE) MG tablet Take 1 tablet (325 mg total) by mouth 2 (two) times daily with a meal. 60 tablet 1  . glucose blood (ONE TOUCH ULTRA TEST) test strip USE TO CHECK BLOOD SUGAR DAILY. E 11.9 (TYPE 2 DIABETES MELLITIS) 100 each 4  . levothyroxine (SYNTHROID, LEVOTHROID) 125 MCG tablet TAKE 1 TABLET BY MOUTH IN THE MORNING ON AN EMPTY STOMACH FOR THYROID 90 tablet 3  . lisinopril-hydrochlorothiazide (PRINZIDE,ZESTORETIC) 20-25 MG tablet Take 1 tablet by mouth daily. 90 tablet 3  . metFORMIN (GLUCOPHAGE) 1000 MG tablet Take 1 tablet (1,000 mg total) by mouth daily after supper. 3 tablet 3  . metoprolol (LOPRESSOR) 50 MG tablet Take 50 mg by mouth 2 (two) times daily.     . Multiple Vitamins-Minerals (PRESERVISION  AREDS 2 PO) Take 2 capsules by mouth daily.    . nitroGLYCERIN (NITROSTAT) 0.4 MG SL tablet Place 1 tablet (0.4 mg total) under the tongue every 5 (five) minutes as needed for chest pain. (Patient not taking: Reported on 03/03/2018) 30 tablet 0  . pantoprazole (PROTONIX) 40 MG tablet Take by mouth.    . simvastatin (ZOCOR) 40 MG tablet TAKE 1 TABLET BY MOUTH EVERY DAY FOR CHOLESTEROL 30 tablet 12  . vitamin B-12 (CYANOCOBALAMIN) 1000 MCG tablet Take 1,000 mcg by mouth daily.      No current facility-administered medications for this visit.      PHYSICAL EXAMINATION: ECOG PERFORMANCE STATUS: 2 - Symptomatic, <50% confined to bed Vitals:   04/14/18 1301  BP: 100/61  Pulse: 64  Temp: (!) 96.8 F (36 C)   Filed Weights   04/14/18 1301  Weight: 141 lb 3 oz (64 kg)    Physical Exam Constitutional:      General: She is not in acute distress.    Appearance: She is not ill-appearing.  HENT:     Head: Normocephalic and atraumatic.  Eyes:     General: No scleral icterus.    Pupils: Pupils are equal, round, and reactive to light.  Neck:     Musculoskeletal: Normal range of motion and neck supple.  Cardiovascular:     Rate and Rhythm: Normal rate and regular rhythm.     Heart sounds: Normal heart sounds.  Pulmonary:     Effort: Pulmonary effort is normal. No respiratory distress.     Breath sounds: No wheezing.  Abdominal:     General: Bowel sounds are normal. There is no distension.     Palpations: Abdomen is soft. There is no mass.     Tenderness: There is no abdominal tenderness.  Musculoskeletal: Normal range of motion.        General: No deformity.  Skin:    General: Skin is warm and dry.     Coloration: Skin is not pale.     Findings: No erythema or rash.  Neurological:     Mental Status: She is alert and oriented to person, place, and time.     Cranial Nerves: No cranial nerve deficit.     Coordination: Coordination normal.  Psychiatric:        Behavior: Behavior  normal.        Thought  Content: Thought content normal.      LABORATORY DATA:  I have reviewed the data as listed Lab Results  Component Value Date   WBC 8.2 04/12/2018   HGB 11.4 (L) 04/12/2018   HCT 35.9 (L) 04/12/2018   MCV 77.9 (L) 04/12/2018   PLT 332 04/12/2018   Recent Labs    05/11/17 1145 02/18/18 1032  02/21/18 0509 02/22/18 0309 02/24/18 1126  NA 141 132*   < > 136 137 138  K 3.7 2.9*   < > 4.0 3.8 4.0  CL 98 95*   < > 104 105 100  CO2 26 23   < > 26 24 23   GLUCOSE 141* 255*   < > 178* 153* 138*  BUN 11 24*   < > 30* 26* 14  CREATININE 0.60 1.42*   < > 1.22* 1.16* 0.89  CALCIUM 9.2 8.2*   < > 8.2* 8.2* 9.6  GFRNONAA 89 36*   < > 43* 45* 63  GFRAA 102 41*   < > 49* 53* 72  PROT  --  7.1  --   --   --   --   ALBUMIN 4.2 3.2*  --   --   --  3.4*  AST  --  28  --   --   --   --   ALT  --  11  --   --   --   --   ALKPHOS  --  99  --   --   --   --   BILITOT  --  0.9  --   --   --   --    < > = values in this interval not displayed.   Iron/TIBC/Ferritin/ %Sat    Component Value Date/Time   IRON 63 04/12/2018 1003   TIBC 333 04/12/2018 1003   FERRITIN 162 04/12/2018 1003   IRONPCTSAT 19 04/12/2018 1003     No results found.   ASSESSMENT & PLAN:  1. Iron deficiency anemia, unspecified iron deficiency anemia type   2. Thrombocytosis (HCC)   3. Borderline blood pressure   4. Gastroesophageal reflux disease, esophagitis presence not specified   Labs reviewed and discussed with patient and her daughter.  Hemoglobin has improved to 11.4, iron panel showed improved ferritin and TSAT.  Still microcytic anemia, MCV 77.9.  Will proceed 4th dose of Venofer.   Patient is anxious and worries about having cancer. She had weight loss, since last visit, weight loss has improved. She gained 4 pounds.  I also discussed with patient's daughter that I do not recommend checking CA19.9 as it is not specific.  CT abdomen pelvis on 02/18/2018 was independently reviewed.  No pancreatic mass/ductal dilation.  .  Thrombocytosis has improved after IV venofer infusion. Likely reactive.   # Blood pressure has improved. Today 100/61. She has followed  Up with Dr.Fisher and has her blood pressure medication adjusted.   # GERD, continue PPI. advise patient to follow up with gastroenterology for GI work up for the etiology of IDA.  She is on Plavix, will need cardiology clearance .   Return in 4 months, for labs, MD assessment and possible additional Venofer.  Both patient and daughter are quite anxious and have a lot of questions.  We spent sufficient time to discuss many aspect of care, questions were answered to patient's satisfaction. Total face to face encounter time for this patient visit was 25 min. >50% of the time was  spent  in counseling and coordination of care.   Orders Placed This Encounter  Procedures  . CBC with Differential/Platelet    Standing Status:   Future    Standing Expiration Date:   04/15/2019  . Ferritin    Standing Status:   Future    Standing Expiration Date:   04/15/2019  . Iron and TIBC    Standing Status:   Future    Standing Expiration Date:   04/15/2019      Earlie Server, MD, PhD Hematology Oncology [facility] Pager- [phone] 04/14/2018

## 2018-04-19 ENCOUNTER — Ambulatory Visit (INDEPENDENT_AMBULATORY_CARE_PROVIDER_SITE_OTHER): Payer: PPO | Admitting: Family Medicine

## 2018-04-19 ENCOUNTER — Encounter: Payer: Self-pay | Admitting: Family Medicine

## 2018-04-19 VITALS — BP 108/63 | HR 67 | Temp 98.3°F | Wt 138.0 lb

## 2018-04-19 DIAGNOSIS — D649 Anemia, unspecified: Secondary | ICD-10-CM

## 2018-04-19 DIAGNOSIS — I10 Essential (primary) hypertension: Secondary | ICD-10-CM

## 2018-04-19 MED ORDER — LISINOPRIL-HYDROCHLOROTHIAZIDE 10-12.5 MG PO TABS: 1.0000 | ORAL_TABLET | Freq: Every day | ORAL | 3 refills | Status: DC

## 2018-04-19 NOTE — Patient Instructions (Addendum)
.   Please review the attached list of medications and notify my office if there are any errors.   . Please bring all of your medications to every appointment so we can make sure that our medication list is the same as yours.    Make sure you pharmacy gives you lisinopril-hctz 10-12.5mg  tablets with next refill

## 2018-04-19 NOTE — Progress Notes (Signed)
Patient: April Pittman Female    DOB: May 23, 1940   78 y.o.   MRN: [id number] Visit Date: 04/19/2018  Today's Provider: [name], MD   Chief Complaint  Patient presents with  . Hypertension    One month follow up   Subjective:     HPI    Hypertension, follow-up:  BP Readings from Last 3 Encounters:  04/19/18 108/63  04/14/18 120/68  04/14/18 100/61    She was last seen for hypertension 1 months ago.  BP at that visit was 114/60. Management since that visit includes Reduce Lisinopril/HCTZ in half of 20/25mg  tablet. She reports excellent compliance with treatment. She is not having side effects. Is not having dizziness anymore. Does no feel week. Iron has been corrected by iron infusions. Lab Results  Component Value Date   WBC 8.2 04/12/2018   HGB 11.4 (L) 04/12/2018   HCT 35.9 (L) 04/12/2018   MCV 77.9 (L) 04/12/2018   PLT 332 04/12/2018   Lab Results  Component Value Date   FERRITIN 162 04/12/2018   .   Outside blood pressures are 100/61 at the cancer center 04/14/2018.     Weight trend: stable Wt Readings from Last 3 Encounters:  04/19/18 138 lb (62.6 kg)  04/14/18 141 lb 3 oz (64 kg)  03/24/18 137 lb (62.1 kg)     ------------------------------------------------------------------------    No Known Allergies   Current Outpatient Medications:  .  calcium carbonate (OS-CAL) 1250 (500 Ca) MG chewable tablet, Chew 1 tablet by mouth 2 (two) times daily., Disp: , Rfl:  .  clopidogrel (PLAVIX) 75 MG tablet, Take 75 mg by mouth daily., Disp: , Rfl:  .  estradiol (ESTRACE) 0.1 MG/GM vaginal cream, Place 1 Applicatorful vaginally once a week., Disp: , Rfl:  .  glucose blood (ONE TOUCH ULTRA TEST) test strip, USE TO CHECK BLOOD SUGAR DAILY. E 11.9 (TYPE 2 DIABETES MELLITIS), Disp: 100 each, Rfl: 4 .  levothyroxine (SYNTHROID, LEVOTHROID) 125 MCG tablet, TAKE 1 TABLET BY MOUTH IN THE MORNING ON AN EMPTY STOMACH FOR THYROID, Disp: 90 tablet, Rfl:  3 .  lisinopril-hydrochlorothiazide (PRINZIDE,ZESTORETIC) 20-25 MG tablet, Take 1 tablet by mouth daily. (Patient taking differently: Take 0.5 tablets by mouth daily. ), Disp: 90 tablet, Rfl: 3 .  metFORMIN (GLUCOPHAGE) 1000 MG tablet, Take 1 tablet (1,000 mg total) by mouth daily after supper. (Patient taking differently: Take 1,000 mg by mouth daily with breakfast. ), Disp: 3 tablet, Rfl: 3 .  metoprolol (LOPRESSOR) 50 MG tablet, Take 50 mg by mouth 2 (two) times daily. , Disp: , Rfl:  .  Multiple Vitamins-Minerals (PRESERVISION AREDS 2 PO), Take 2 capsules by mouth daily., Disp: , Rfl:  .  pantoprazole (PROTONIX) 40 MG tablet, Take by mouth., Disp: , Rfl:  .  simvastatin (ZOCOR) 40 MG tablet, TAKE 1 TABLET BY MOUTH EVERY DAY FOR CHOLESTEROL, Disp: 30 tablet, Rfl: 12 .  vitamin B-12 (CYANOCOBALAMIN) 1000 MCG tablet, Take 1,000 mcg by mouth daily. , Disp: , Rfl:  .  ferrous sulfate 325 (65 FE) MG tablet, Take 1 tablet (325 mg total) by mouth 2 (two) times daily with a meal. (Patient not taking: Reported on 04/14/2018), Disp: 60 tablet, Rfl: 1 .  nitroGLYCERIN (NITROSTAT) 0.4 MG SL tablet, Place 1 tablet (0.4 mg total) under the tongue every 5 (five) minutes as needed for chest pain., Disp: 30 tablet, Rfl: 0  Review of Systems  Constitutional: Negative for activity change, appetite change, chills,  diaphoresis, fatigue, fever and unexpected weight change.  Respiratory: Negative.   Cardiovascular: Negative.   Gastrointestinal: Positive for nausea (Last night but feels okay today). Negative for abdominal distention, abdominal pain, anal bleeding, blood in stool, constipation, diarrhea, rectal pain and vomiting.  Neurological: Negative for dizziness, light-headedness and headaches.    Social History   Tobacco Use  . Smoking status: Former Smoker    Last attempt to quit: 03/03/1988    Years since quitting: 30.1  . Smokeless tobacco: Never Used  Substance Use Topics  . Alcohol use: No     Alcohol/week: 0.0 standard drinks      Objective:   BP 108/63 (BP Location: Right Arm, Patient Position: Sitting, Cuff Size: Normal)   Pulse 67   Temp 98.3 F (36.8 C) (Oral)   Wt 138 lb (62.6 kg)   BMI 24.45 kg/m    Physical Exam  General Appearance:    Alert, cooperative, no distress  Eyes:    PERRL, conjunctiva/corneas clear, EOM's intact       Lungs:     Clear to auscultation bilaterally, respirations unlabored  Heart:    Regular rate and rhythm  Neurologic:   Awake, alert, oriented x 3. No apparent focal neurological           defect.          Assessment & Plan    1. Essential hypertension Feeling better on lowered dose of lisinopril-hctz. Will replace with 10-12.5mg  tablets with next refill. Prescription sent to pharmacy.   2. Anemia, unspecified type Corrected after iron infusions with hematology  Follow up for diabetes in 2 months.      [name], MD  [facility]

## 2018-05-03 DIAGNOSIS — D509 Iron deficiency anemia, unspecified: Secondary | ICD-10-CM | POA: Diagnosis not present

## 2018-05-09 DIAGNOSIS — H353221 Exudative age-related macular degeneration, left eye, with active choroidal neovascularization: Secondary | ICD-10-CM | POA: Diagnosis not present

## 2018-05-12 ENCOUNTER — Encounter: Payer: Self-pay | Admitting: *Deleted

## 2018-05-12 ENCOUNTER — Ambulatory Visit: Payer: PPO | Admitting: Certified Registered"

## 2018-05-12 ENCOUNTER — Encounter
Admission: RE | Disposition: A | Discharge status: Home / Self Care | Payer: Self-pay | Source: Home / Self Care | Attending: Unknown Physician Specialty

## 2018-05-12 ENCOUNTER — Other Ambulatory Visit: Payer: Self-pay

## 2018-05-12 ENCOUNTER — Ambulatory Visit
Admission: RE | Admit: 2018-05-12 | Discharge: 2018-05-12 | Disposition: A | Discharge status: Home / Self Care | Payer: PPO | Attending: Unknown Physician Specialty | Admitting: Unknown Physician Specialty

## 2018-05-12 DIAGNOSIS — E119 Type 2 diabetes mellitus without complications: Secondary | ICD-10-CM | POA: Insufficient documentation

## 2018-05-12 DIAGNOSIS — K640 First degree hemorrhoids: Secondary | ICD-10-CM | POA: Insufficient documentation

## 2018-05-12 DIAGNOSIS — Z9841 Cataract extraction status, right eye: Secondary | ICD-10-CM | POA: Insufficient documentation

## 2018-05-12 DIAGNOSIS — Z7902 Long term (current) use of antithrombotics/antiplatelets: Secondary | ICD-10-CM | POA: Diagnosis not present

## 2018-05-12 DIAGNOSIS — I2510 Atherosclerotic heart disease of native coronary artery without angina pectoris: Secondary | ICD-10-CM | POA: Diagnosis not present

## 2018-05-12 DIAGNOSIS — Z961 Presence of intraocular lens: Secondary | ICD-10-CM | POA: Diagnosis not present

## 2018-05-12 DIAGNOSIS — Z7984 Long term (current) use of oral hypoglycemic drugs: Secondary | ICD-10-CM | POA: Insufficient documentation

## 2018-05-12 DIAGNOSIS — D120 Benign neoplasm of cecum: Secondary | ICD-10-CM | POA: Diagnosis not present

## 2018-05-12 DIAGNOSIS — D509 Iron deficiency anemia, unspecified: Secondary | ICD-10-CM | POA: Insufficient documentation

## 2018-05-12 DIAGNOSIS — E039 Hypothyroidism, unspecified: Secondary | ICD-10-CM | POA: Diagnosis not present

## 2018-05-12 DIAGNOSIS — E7800 Pure hypercholesterolemia, unspecified: Secondary | ICD-10-CM | POA: Diagnosis not present

## 2018-05-12 DIAGNOSIS — K589 Irritable bowel syndrome without diarrhea: Secondary | ICD-10-CM | POA: Diagnosis not present

## 2018-05-12 DIAGNOSIS — Z79899 Other long term (current) drug therapy: Secondary | ICD-10-CM | POA: Diagnosis not present

## 2018-05-12 DIAGNOSIS — K449 Diaphragmatic hernia without obstruction or gangrene: Secondary | ICD-10-CM | POA: Insufficient documentation

## 2018-05-12 DIAGNOSIS — K2970 Gastritis, unspecified, without bleeding: Secondary | ICD-10-CM | POA: Diagnosis not present

## 2018-05-12 DIAGNOSIS — D122 Benign neoplasm of ascending colon: Secondary | ICD-10-CM | POA: Diagnosis not present

## 2018-05-12 DIAGNOSIS — K2960 Other gastritis without bleeding: Secondary | ICD-10-CM | POA: Diagnosis not present

## 2018-05-12 DIAGNOSIS — Z8673 Personal history of transient ischemic attack (TIA), and cerebral infarction without residual deficits: Secondary | ICD-10-CM | POA: Diagnosis not present

## 2018-05-12 DIAGNOSIS — I10 Essential (primary) hypertension: Secondary | ICD-10-CM | POA: Diagnosis not present

## 2018-05-12 DIAGNOSIS — Z87891 Personal history of nicotine dependence: Secondary | ICD-10-CM | POA: Diagnosis not present

## 2018-05-12 DIAGNOSIS — Z9842 Cataract extraction status, left eye: Secondary | ICD-10-CM | POA: Diagnosis not present

## 2018-05-12 DIAGNOSIS — K2950 Unspecified chronic gastritis without bleeding: Secondary | ICD-10-CM | POA: Diagnosis not present

## 2018-05-12 DIAGNOSIS — D126 Benign neoplasm of colon, unspecified: Secondary | ICD-10-CM | POA: Diagnosis not present

## 2018-05-12 DIAGNOSIS — K219 Gastro-esophageal reflux disease without esophagitis: Secondary | ICD-10-CM | POA: Insufficient documentation

## 2018-05-12 DIAGNOSIS — K635 Polyp of colon: Secondary | ICD-10-CM | POA: Diagnosis not present

## 2018-05-12 DIAGNOSIS — Z85820 Personal history of malignant melanoma of skin: Secondary | ICD-10-CM | POA: Insufficient documentation

## 2018-05-12 DIAGNOSIS — K3189 Other diseases of stomach and duodenum: Secondary | ICD-10-CM | POA: Diagnosis not present

## 2018-05-12 HISTORY — PX: ESOPHAGOGASTRODUODENOSCOPY (EGD) WITH PROPOFOL: SHX5813

## 2018-05-12 HISTORY — PX: COLONOSCOPY WITH PROPOFOL: SHX5780

## 2018-05-12 LAB — GLUCOSE, CAPILLARY: Glucose-Capillary: 140 mg/dL — ABNORMAL HIGH (ref 70–99)

## 2018-05-12 SURGERY — ESOPHAGOGASTRODUODENOSCOPY (EGD) WITH PROPOFOL
Anesthesia: General

## 2018-05-12 MED ORDER — ONDANSETRON HCL 4 MG/2ML IJ SOLN
INTRAMUSCULAR | Status: AC
  Filled 2018-05-12: qty 2

## 2018-05-12 MED ORDER — EPHEDRINE SULFATE 50 MG/ML IJ SOLN
INTRAMUSCULAR | Status: DC | PRN
  Administered 2018-05-12: 10 mg via INTRAVENOUS

## 2018-05-12 MED ORDER — PROPOFOL 500 MG/50ML IV EMUL
INTRAVENOUS | Status: DC | PRN
  Administered 2018-05-12: 120 ug/kg/min via INTRAVENOUS

## 2018-05-12 MED ORDER — PROPOFOL 500 MG/50ML IV EMUL
INTRAVENOUS | Status: AC
  Filled 2018-05-12: qty 50

## 2018-05-12 MED ORDER — FENTANYL CITRATE (PF) 100 MCG/2ML IJ SOLN
INTRAMUSCULAR | Status: AC
  Filled 2018-05-12: qty 2

## 2018-05-12 MED ORDER — FENTANYL CITRATE (PF) 100 MCG/2ML IJ SOLN
INTRAMUSCULAR | Status: DC | PRN
  Administered 2018-05-12: 50 ug via INTRAVENOUS
  Administered 2018-05-12 (×2): 25 ug via INTRAVENOUS

## 2018-05-12 MED ORDER — SODIUM CHLORIDE 0.9 % IV SOLN
INTRAVENOUS | Status: DC
  Administered 2018-05-12: 09:00:00 via INTRAVENOUS

## 2018-05-12 MED ORDER — GLYCOPYRROLATE 0.2 MG/ML IJ SOLN
INTRAMUSCULAR | Status: DC | PRN
  Administered 2018-05-12: 0.2 mg via INTRAVENOUS

## 2018-05-12 MED ORDER — PROPOFOL 10 MG/ML IV BOLUS
INTRAVENOUS | Status: DC | PRN
  Administered 2018-05-12: 50 mg via INTRAVENOUS

## 2018-05-12 MED ORDER — LIDOCAINE 2% (20 MG/ML) 5 ML SYRINGE
INTRAMUSCULAR | Status: DC | PRN
  Administered 2018-05-12: 25 mg via INTRAVENOUS

## 2018-05-12 MED ORDER — ONDANSETRON HCL 4 MG/2ML IJ SOLN
INTRAMUSCULAR | Status: DC | PRN
  Administered 2018-05-12: 4 mg via INTRAVENOUS

## 2018-05-12 MED ORDER — SODIUM CHLORIDE 0.9 % IV SOLN: INTRAVENOUS | Status: DC

## 2018-05-12 NOTE — Anesthesia Postprocedure Evaluation (Signed)
Anesthesia Post Note  Patient: April Pittman  Procedure(s) Performed: ESOPHAGOGASTRODUODENOSCOPY (EGD) WITH PROPOFOL (N/A ) COLONOSCOPY WITH PROPOFOL (N/A )  Patient location during evaluation: PACU Anesthesia Type: General Level of consciousness: awake and alert Pain management: pain level controlled Vital Signs Assessment: post-procedure vital signs reviewed and stable Respiratory status: spontaneous breathing, nonlabored ventilation and respiratory function stable Cardiovascular status: blood pressure returned to baseline and stable Postop Assessment: no apparent nausea or vomiting Anesthetic complications: no     Last Vitals:  Vitals:   05/12/18 1008 05/12/18 1018  BP: (!) 102/43 120/78  Pulse: 65   Resp: (!) 22 16  Temp:    SpO2: 96% 96%    Last Pain:  Vitals:   05/12/18 1018  TempSrc:   PainSc: 0-No pain                 Durenda Hurt

## 2018-05-12 NOTE — Anesthesia Preprocedure Evaluation (Addendum)
Anesthesia Evaluation  Patient identified by MRN, date of birth, ID band Patient awake    Reviewed: Allergy & Precautions, H&P , NPO status , Patient's Chart, lab work & pertinent test results  History of Anesthesia Complications (+) PONV and history of anesthetic complications  Airway Mallampati: III  TM Distance: >3 FB     Dental  (+) Missing   Pulmonary neg pulmonary ROS, former smoker,           Cardiovascular hypertension, (-) angina (Denies any recent angina)+ CAD and + Cardiac Stents    Echo 12/331/19: Left ventricle: The cavity size was normal. Systolic function was   normal. The estimated ejection fraction was in the range of 50%   to 55%. - Left atrium: The atrium was mildly dilated. - Right atrium: The atrium was mildly dilated.   Neuro/Psych TIAnegative psych ROS   GI/Hepatic Neg liver ROS, hiatal hernia, GERD  ,  Endo/Other  diabetesHypothyroidism   Renal/GU negative Renal ROS  negative genitourinary   Musculoskeletal  (+) Arthritis ,   Abdominal   Peds  Hematology  (+) Blood dyscrasia, anemia ,   Anesthesia Other Findings Past Medical History: No date: Alopecia No date: Anginal pain (HCC) No date: Benign neoplasm of large bowel No date: CAD (coronary artery disease) No date: Carpal tunnel syndrome No date: Complication of anesthesia     Comment:  vomits with any pain medicine No date: DM (diabetes mellitus) (HCC) No date: Erosion of cervix No date: GERD (gastroesophageal reflux disease) No date: H/O adenomatous polyp of colon     Comment:  with sever dysplasia, removed 2000 by colonoscopy No date: History of chicken pox No date: Hypercholesteremia No date: Hypertension No date: Hypothyroid No date: Irritable bowel No date: Malignant melanoma of foot ([place]) No date: Osteoporosis No date: PONV (postoperative nausea and vomiting) No date: Post-menopausal bleeding No date: Procidentia of  uterus     Comment:  with traction cystocele, managed with gellhorn pessary No date: Rectal bleeding 02/18/2018: Sepsis ([place]) No date: TIA (transient ischemic attack)  Past Surgical History: No date: BREAST EXCISIONAL BIOPSY; Right     Comment:  Benign 10/2010: CARDIAC CATHETERIZATION     Comment:  +stenting to LAD and RCA; symptoms: chest pain, elevated              Troponin, +AMI. [place] 07/28/2016: CATARACT EXTRACTION W/PHACO; Right     Comment:  Procedure: CATARACT EXTRACTION PHACO AND INTRAOCULAR               LENS PLACEMENT ([place])  Right Diabetic;  Surgeon:               Leandrew Koyanagi, MD;  Location: [place];              Service: Ophthalmology;  Laterality: Right;                Diabetic leave patient at 9 arrival 10/27/2016: CATARACT EXTRACTION W/PHACO; Left     Comment:  Procedure: CATARACT EXTRACTION PHACO AND INTRAOCULAR               LENS PLACEMENT ([place]) LEFT DIABETIC;  Surgeon: Leandrew Koyanagi, MD;  Location: [place];  Service:               Ophthalmology;  Laterality: Left; 03/2008: COLONOSCOPY     Comment:  2 polyps, internal and external hemorrhoids, +anal  growth. Dr. Vira Agar No date: KNEE SURGERY No date: TONSILLECTOMY     Comment:  as a child  BMI    Body Mass Index:  24.45 kg/m      Reproductive/Obstetrics negative OB ROS                            Anesthesia Physical Anesthesia Plan  ASA: III  Anesthesia Plan: General   Post-op Pain Management:    Induction:   PONV Risk Score and Plan: Propofol infusion, TIVA and Ondansetron  Airway Management Planned: Nasal Cannula  Additional Equipment:   Intra-op Plan:   Post-operative Plan:   Informed Consent: I have reviewed the patients History and Physical, chart, labs and discussed the procedure including the risks, benefits and alternatives for the proposed anesthesia with the patient or authorized  representative who has indicated his/her understanding and acceptance.     Dental Advisory Given  Plan Discussed with: Anesthesiologist and CRNA  Anesthesia Plan Comments:        Anesthesia Quick Evaluation

## 2018-05-12 NOTE — Op Note (Signed)
[facility] Gastroenterology Patient Name: April Pittman Procedure Date: 05/12/2018 9:12 AM MRN: [id number] Account #: [id number] Date of Birth: June 16, 1940 Admit Type: Outpatient Age: 78 Room: [facility] ENDO ROOM 3 Gender: Female Note Status: Finalized Procedure:            Upper GI endoscopy Indications:          Unexplained iron deficiency anemia Providers:            Manya Silvas, MD Referring MD:         Kirstie Peri. Caryn Section, MD (Referring MD) Medicines:            Propofol per Anesthesia Complications:        No immediate complications. Procedure:            Pre-Anesthesia Assessment:                       - After reviewing the risks and benefits, the patient                        was deemed in satisfactory condition to undergo the                        procedure.                       After obtaining informed consent, the endoscope was                        passed under direct vision. Throughout the procedure,                        the patient's blood pressure, pulse, and oxygen                        saturations were monitored continuously. The Endoscope                        was introduced through the mouth, and advanced to the                        second part of duodenum. The upper GI endoscopy was                        accomplished without difficulty. The patient tolerated                        the procedure well. Findings:      The examined esophagus was normal.      A small hiatal hernia was present. GEJ 36cm.      Patchy mild inflammation characterized by erythema and granularity was       found in the gastric antrum. Biopsies were taken with a cold forceps for       histology. Biopsies were taken with a cold forceps for Helicobacter       pylori testing.      The examined duodenum was normal. Impression:           - Normal esophagus.                       - Small hiatal hernia.                       -  Gastritis. Biopsied.      - Normal examined duodenum. Recommendation:       - Await pathology results. Manya Silvas, MD 05/12/2018 9:30:49 AM This report has been signed electronically. Number of Addenda: 0 Note Initiated On: 05/12/2018 9:12 AM      [facility]

## 2018-05-12 NOTE — Op Note (Signed)
[facility] Gastroenterology Patient Name: April Pittman Procedure Date: 05/12/2018 9:10 AM MRN: [id number] Account #: [id number] Date of Birth: August 11, 1940 Admit Type: Outpatient Age: 78 Room: [facility] ENDO ROOM 3 Gender: Female Note Status: Finalized Procedure:            Colonoscopy Indications:          Iron deficiency anemia Providers:            Manya Silvas, MD Referring MD:         Kirstie Peri. Caryn Section, MD (Referring MD) Medicines:            Propofol per Anesthesia Complications:        No immediate complications. Procedure:            Pre-Anesthesia Assessment:                       - After reviewing the risks and benefits, the patient                        was deemed in satisfactory condition to undergo the                        procedure.                       After obtaining informed consent, the colonoscope was                        passed under direct vision. Throughout the procedure,                        the patient's blood pressure, pulse, and oxygen                        saturations were monitored continuously. The                        Colonoscope was introduced through the anus and                        advanced to the the cecum, identified by appendiceal                        orifice and ileocecal valve. The colonoscopy was                        performed without difficulty. The patient tolerated the                        procedure well. The quality of the bowel preparation                        was good. Findings:      Two sessile polyps were found in the ascending colon and cecum. The       polyps were diminutive in size. These polyps were removed with a cold       biopsy forceps. Resection and retrieval were complete.      Internal hemorrhoids were found during endoscopy. The hemorrhoids were       small and Grade I (internal hemorrhoids that do not prolapse).  The exam was otherwise without abnormality. Impression:            - Two diminutive polyps in the ascending colon and in                        the cecum, removed with a cold biopsy forceps. Resected                        and retrieved.                       - Internal hemorrhoids.                       - The examination was otherwise normal. Recommendation:       - Await pathology results. Manya Silvas, MD 05/12/2018 9:49:58 AM This report has been signed electronically. Number of Addenda: 0 Note Initiated On: 05/12/2018 9:10 AM Scope Withdrawal Time: 0 hours 8 minutes 56 seconds  Total Procedure Duration: 0 hours 14 minutes 45 seconds       [facility]

## 2018-05-12 NOTE — Transfer of Care (Signed)
Immediate Anesthesia Transfer of Care Note  Patient: April Pittman  Procedure(s) Performed: ESOPHAGOGASTRODUODENOSCOPY (EGD) WITH PROPOFOL (N/A ) COLONOSCOPY WITH PROPOFOL (N/A )  Patient Location: PACU and Endoscopy Unit  Anesthesia Type:General  Level of Consciousness: awake and alert   Airway & Oxygen Therapy: Patient Spontanous Breathing  Post-op Assessment: Report given to RN and Post -op Vital signs reviewed and stable  Post vital signs: Reviewed  Last Vitals:  Vitals Value Taken Time  BP 98/58 05/12/2018  9:50 AM  Temp 36.1 C 05/12/2018  9:50 AM  Pulse 71 05/12/2018  9:50 AM  Resp 14 05/12/2018  9:50 AM  SpO2 97 % 05/12/2018  9:50 AM  Vitals shown include unvalidated device data.  Last Pain:  Vitals:   05/12/18 0948  TempSrc: Tympanic  PainSc:          Complications: No apparent anesthesia complications

## 2018-05-12 NOTE — Anesthesia Post-op Follow-up Note (Signed)
Anesthesia QCDR form completed.        

## 2018-05-12 NOTE — H&P (Signed)
Primary Care Physician:  Birdie Sons, MD Primary Gastroenterologist:  Dr. Vira Agar  Pre-Procedure History & Physical: HPI:  April Pittman is a 78 y.o. female is here for an endoscopy and colonoscopy.   Past Medical History:  Diagnosis Date  . Alopecia   . Anginal pain ([place])   . Benign neoplasm of large bowel   . CAD (coronary artery disease)   . Carpal tunnel syndrome   . Complication of anesthesia    vomits with any pain medicine  . DM (diabetes mellitus) ([place])   . Erosion of cervix   . GERD (gastroesophageal reflux disease)   . H/O adenomatous polyp of colon    with sever dysplasia, removed 2000 by colonoscopy  . History of chicken pox   . Hypercholesteremia   . Hypertension   . Hypothyroid   . Irritable bowel   . Malignant melanoma of foot ([place])   . Osteoporosis   . PONV (postoperative nausea and vomiting)   . Post-menopausal bleeding   . Procidentia of uterus    with traction cystocele, managed with gellhorn pessary  . Rectal bleeding   . Sepsis ([place]) 02/18/2018  . TIA (transient ischemic attack)     Past Surgical History:  Procedure Laterality Date  . BREAST EXCISIONAL BIOPSY Right    Benign  . CARDIAC CATHETERIZATION  10/2010   +stenting to LAD and RCA; symptoms: chest pain, elevated Troponin, +AMI. [employer]  . CATARACT EXTRACTION W/PHACO Right 07/28/2016   Procedure: CATARACT EXTRACTION PHACO AND INTRAOCULAR LENS PLACEMENT ([place])  Right Diabetic;  Surgeon: Leandrew Koyanagi, MD;  Location: [place];  Service: Ophthalmology;  Laterality: Right;  Diabetic leave patient at 9 arrival  . CATARACT EXTRACTION W/PHACO Left 10/27/2016   Procedure: CATARACT EXTRACTION PHACO AND INTRAOCULAR LENS PLACEMENT ([place]) LEFT DIABETIC;  Surgeon: Leandrew Koyanagi, MD;  Location: [place];  Service: Ophthalmology;  Laterality: Left;  . COLONOSCOPY  03/2008   2 polyps, internal and external hemorrhoids, +anal growth. Dr. Vira Agar  . KNEE SURGERY     . TONSILLECTOMY     as a child    Prior to Admission medications   Medication Sig Start Date End Date Taking? Authorizing Provider  estradiol (ESTRACE) 0.1 MG/GM vaginal cream Place 1 Applicatorful vaginally once a week. 03/20/14  Yes [provider]  levothyroxine (SYNTHROID, LEVOTHROID) 125 MCG tablet TAKE 1 TABLET BY MOUTH IN THE MORNING ON AN EMPTY STOMACH FOR THYROID 10/27/17  Yes Birdie Sons, MD  lisinopril-hydrochlorothiazide (PRINZIDE,ZESTORETIC) 10-12.5 MG tablet Take 1 tablet by mouth daily. 04/19/18  Yes Birdie Sons, MD  metFORMIN (GLUCOPHAGE) 1000 MG tablet Take 1 tablet (1,000 mg total) by mouth daily after supper. Patient taking differently: Take 1,000 mg by mouth daily with breakfast.  03/24/18  Yes Birdie Sons, MD  metoprolol (LOPRESSOR) 50 MG tablet Take 50 mg by mouth 2 (two) times daily.  03/20/14  Yes [provider]  Multiple Vitamins-Minerals (PRESERVISION AREDS 2 PO) Take 2 capsules by mouth daily.   Yes [provider]  pantoprazole (PROTONIX) 40 MG tablet Take by mouth. 03/09/18  Yes [provider]  simvastatin (ZOCOR) 40 MG tablet TAKE 1 TABLET BY MOUTH EVERY DAY FOR CHOLESTEROL 06/03/17  Yes Birdie Sons, MD  vitamin B-12 (CYANOCOBALAMIN) 1000 MCG tablet Take 1,000 mcg by mouth daily.    Yes [provider]  calcium carbonate (OS-CAL) 1250 (500 Ca) MG chewable tablet Chew 1 tablet by mouth 2 (two) times daily.  [provider]  clopidogrel (PLAVIX) 75 MG tablet Take 75 mg by mouth daily. 03/20/14   [provider]  ferrous sulfate 325 (65 FE) MG tablet Take 1 tablet (325 mg total) by mouth 2 (two) times daily with a meal. Patient not taking: Reported on 04/14/2018 02/22/18 04/23/18  Henreitta Leber, MD  glucose blood (ONE TOUCH ULTRA TEST) test strip USE TO CHECK BLOOD SUGAR DAILY. E 11.9 (TYPE 2 DIABETES MELLITIS) 01/27/18   Birdie Sons, MD  nitroGLYCERIN (NITROSTAT) 0.4 MG SL tablet Place  1 tablet (0.4 mg total) under the tongue every 5 (five) minutes as needed for chest pain. 09/29/15   Birdie Sons, MD    Allergies as of 03/10/2018  . (No Known Allergies)    Family History  Problem Relation Age of Onset  . Lung cancer Brother   . Diabetes Father   . Heart disease Sister        Valvular rheumatic heart disease  . Rheumatic fever Sister   . Heart disease Paternal Grandmother   . Breast cancer Neg Hx   . Ovarian cancer Neg Hx   . Colon cancer Neg Hx     Social History   Socioeconomic History  . Marital status: Married    Spouse name: Not on file  . Number of children: 3  . Years of education: Not on file  . Highest education level: GED or equivalent  Occupational History  . Occupation: Retired    Comment: former Counselling psychologist  . Financial resource strain: Not hard at all  . Food insecurity:    Worry: Never true    Inability: Never true  . Transportation needs:    Medical: No    Non-medical: No  Tobacco Use  . Smoking status: Former Smoker    Last attempt to quit: 03/03/1988    Years since quitting: 30.2  . Smokeless tobacco: Never Used  Substance and Sexual Activity  . Alcohol use: No    Alcohol/week: 0.0 standard drinks  . Drug use: No  . Sexual activity: Not Currently  Lifestyle  . Physical activity:    Days per week: Not on file    Minutes per session: Not on file  . Stress: To some extent  Relationships  . Social connections:    Talks on phone: Not on file    Gets together: Not on file    Attends religious service: Not on file    Active member of club or organization: Not on file    Attends meetings of clubs or organizations: Not on file    Relationship status: Not on file  . Intimate partner violence:    Fear of current or ex partner: Not on file    Emotionally abused: Not on file    Physically abused: Not on file    Forced sexual activity: Not on file  Other Topics Concern  . Not on file  Social History  Narrative  . Not on file    Review of Systems: See HPI, otherwise negative ROS  Physical Exam: BP (!) 151/75   Pulse (!) 53   Temp (!) 97.1 F (36.2 C) (Tympanic)   Resp 16   Ht 5\' 3"  (1.6 m)   SpO2 98%   BMI 24.45 kg/m  General:   Alert,  pleasant and cooperative in NAD Head:  Normocephalic and atraumatic. Neck:  Supple; no masses or thyromegaly. Lungs:  Clear throughout to auscultation.    Heart:  Regular rate and rhythm. Abdomen:  Soft, nontender and nondistended. Normal bowel sounds, without guarding, and without rebound.   Neurologic:  Alert and  oriented x4;  grossly normal neurologically.  Impression/Plan: Gypsy Lore Philley is here for an endoscopy and colonoscopy to be performed for iron def anemia.  Risks, benefits, limitations, and alternatives regarding  endoscopy and colonoscopy have been reviewed with the patient.  Questions have been answered.  All parties agreeable.   Gaylyn Cheers, MD  05/12/2018, 9:11 AM

## 2018-05-15 LAB — SURGICAL PATHOLOGY

## 2018-05-18 ENCOUNTER — Ambulatory Visit: Payer: Self-pay | Admitting: Family Medicine

## 2018-05-19 ENCOUNTER — Encounter: Payer: Self-pay | Admitting: Unknown Physician Specialty

## 2018-05-30 DIAGNOSIS — I2510 Atherosclerotic heart disease of native coronary artery without angina pectoris: Secondary | ICD-10-CM | POA: Diagnosis not present

## 2018-05-30 DIAGNOSIS — I10 Essential (primary) hypertension: Secondary | ICD-10-CM | POA: Diagnosis not present

## 2018-05-30 DIAGNOSIS — M810 Age-related osteoporosis without current pathological fracture: Secondary | ICD-10-CM | POA: Diagnosis not present

## 2018-05-30 DIAGNOSIS — Z8262 Family history of osteoporosis: Secondary | ICD-10-CM | POA: Diagnosis not present

## 2018-05-30 DIAGNOSIS — E7800 Pure hypercholesterolemia, unspecified: Secondary | ICD-10-CM | POA: Diagnosis not present

## 2018-05-30 DIAGNOSIS — Z981 Arthrodesis status: Secondary | ICD-10-CM | POA: Diagnosis not present

## 2018-05-30 DIAGNOSIS — E119 Type 2 diabetes mellitus without complications: Secondary | ICD-10-CM | POA: Diagnosis not present

## 2018-06-08 ENCOUNTER — Emergency Department: Payer: PPO

## 2018-06-08 ENCOUNTER — Emergency Department
Admission: EM | Admit: 2018-06-08 | Discharge: 2018-06-09 | Disposition: A | Discharge status: Home / Self Care | Payer: PPO | Attending: Emergency Medicine | Admitting: Emergency Medicine

## 2018-06-08 ENCOUNTER — Other Ambulatory Visit: Payer: Self-pay

## 2018-06-08 DIAGNOSIS — R079 Chest pain, unspecified: Secondary | ICD-10-CM | POA: Diagnosis not present

## 2018-06-08 DIAGNOSIS — E119 Type 2 diabetes mellitus without complications: Secondary | ICD-10-CM | POA: Diagnosis not present

## 2018-06-08 DIAGNOSIS — R748 Abnormal levels of other serum enzymes: Secondary | ICD-10-CM | POA: Insufficient documentation

## 2018-06-08 DIAGNOSIS — I2510 Atherosclerotic heart disease of native coronary artery without angina pectoris: Secondary | ICD-10-CM | POA: Insufficient documentation

## 2018-06-08 DIAGNOSIS — I10 Essential (primary) hypertension: Secondary | ICD-10-CM | POA: Diagnosis not present

## 2018-06-08 DIAGNOSIS — Z87891 Personal history of nicotine dependence: Secondary | ICD-10-CM | POA: Insufficient documentation

## 2018-06-08 DIAGNOSIS — K5790 Diverticulosis of intestine, part unspecified, without perforation or abscess without bleeding: Secondary | ICD-10-CM | POA: Diagnosis not present

## 2018-06-08 DIAGNOSIS — Z79899 Other long term (current) drug therapy: Secondary | ICD-10-CM | POA: Insufficient documentation

## 2018-06-08 DIAGNOSIS — M546 Pain in thoracic spine: Secondary | ICD-10-CM | POA: Diagnosis not present

## 2018-06-08 DIAGNOSIS — R109 Unspecified abdominal pain: Secondary | ICD-10-CM | POA: Diagnosis not present

## 2018-06-08 LAB — CBC WITH DIFFERENTIAL/PLATELET
Abs Immature Granulocytes: 0.02 10*3/uL (ref 0.00–0.07)
Basophils Absolute: 0.1 10*3/uL (ref 0.0–0.1)
Basophils Relative: 2 %
Eosinophils Absolute: 0.7 10*3/uL — ABNORMAL HIGH (ref 0.0–0.5)
Eosinophils Relative: 8 %
HCT: 37.6 % (ref 36.0–46.0)
Hemoglobin: 12.4 g/dL (ref 12.0–15.0)
Immature Granulocytes: 0 %
Lymphocytes Relative: 28 %
Lymphs Abs: 2.5 10*3/uL (ref 0.7–4.0)
MCH: 27.5 pg (ref 26.0–34.0)
MCHC: 33 g/dL (ref 30.0–36.0)
MCV: 83.4 fL (ref 80.0–100.0)
Monocytes Absolute: 0.9 10*3/uL (ref 0.1–1.0)
Monocytes Relative: 10 %
Neutro Abs: 4.7 10*3/uL (ref 1.7–7.7)
Neutrophils Relative %: 52 %
Platelets: 329 10*3/uL (ref 150–400)
RBC: 4.51 MIL/uL (ref 3.87–5.11)
RDW: 13.6 % (ref 11.5–15.5)
WBC: 8.9 10*3/uL (ref 4.0–10.5)
nRBC: 0 % (ref 0.0–0.2)

## 2018-06-08 LAB — COMPREHENSIVE METABOLIC PANEL
ALT: 10 U/L (ref 0–44)
AST: 17 U/L (ref 15–41)
Albumin: 3.8 g/dL (ref 3.5–5.0)
Alkaline Phosphatase: 76 U/L (ref 38–126)
Anion gap: 11 (ref 5–15)
BUN: 23 mg/dL (ref 8–23)
CO2: 25 mmol/L (ref 22–32)
Calcium: 9.2 mg/dL (ref 8.9–10.3)
Chloride: 101 mmol/L (ref 98–111)
Creatinine, Ser: 0.62 mg/dL (ref 0.44–1.00)
GFR calc Af Amer: >60 mL/min (ref >60)
GFR calc non Af Amer: >60 mL/min (ref >60)
Glucose, Bld: 239 mg/dL — ABNORMAL HIGH (ref 70–99)
Potassium: 3.6 mmol/L (ref 3.5–5.1)
Sodium: 137 mmol/L (ref 135–145)
Total Bilirubin: 0.4 mg/dL (ref 0.3–1.2)
Total Protein: 7.5 g/dL (ref 6.5–8.1)

## 2018-06-08 LAB — TROPONIN I: Troponin I: <0.03 ng/mL (ref <0.03)

## 2018-06-08 LAB — LIPASE, BLOOD: Lipase: 73 U/L — ABNORMAL HIGH (ref 11–51)

## 2018-06-08 NOTE — ED Provider Notes (Signed)
[facility] Emergency Department Provider Note  ____________________________________________   First MD Initiated Contact with Patient 06/08/18 2302     (approximate)  I have reviewed the triage vital signs and the nursing notes.   HISTORY  Chief Complaint Chest Pain    HPI April Pittman is a 78 y.o. female with medical history as listed below with extensive medical history as listed below which notably includes a history of coronary artery disease status post multiple stents and who is sees Dr. Ubaldo Glassing for cardiology.  She denies having regular chest pain although she does have a history of "anginal pain" on her medical history list.  She presents tonight for evaluation of chest pain that occurred shortly after she woke up this morning and has persisted intermittently throughout the day.  She describes it as being on the left side of her chest.  She has been seen recently by Dr. Vira Agar for upper and lower endoscopies and nothing in particular was abnormal.  She has been told that she has gallbladder disease in the past but she is not having any pain in her right upper quadrant.  She denies nausea and vomiting.  She has no lower abdominal pain.  She says that the chest pain is sharp and stabbing and comes and goes and nothing in particular makes it better or worse.  She denies shortness of breath, cough, nasal congestion, sore throat, and she has been trying to isolate herself during the COVID-19 pandemic.  Since the chest pain was persisting tonight she felt she should get it checked out.  She took a nitroglycerin at home which seemed to make the pain a little bit better.         Past Medical History:  Diagnosis Date   Alopecia    Anginal pain ([place])    Benign neoplasm of large bowel    CAD (coronary artery disease)    Carpal tunnel syndrome    Complication of anesthesia    vomits with any pain medicine   DM (diabetes mellitus) ([place])    Erosion of  cervix    GERD (gastroesophageal reflux disease)    H/O adenomatous polyp of colon    with sever dysplasia, removed 2000 by colonoscopy   History of chicken pox    Hypercholesteremia    Hypertension    Hypothyroid    Irritable bowel    Malignant melanoma of foot ([place])    Osteoporosis    PONV (postoperative nausea and vomiting)    Post-menopausal bleeding    Procidentia of uterus    with traction cystocele, managed with gellhorn pessary   Rectal bleeding    Sepsis ([place]) 02/18/2018   TIA (transient ischemic attack)     Patient Active Problem List   Diagnosis Date Noted   Thrombocytosis ([place]) 04/14/2018   Borderline blood pressure 04/14/2018   Hiatal hernia 03/24/2018   Cholelithiases 03/24/2018   Iron deficiency anemia 03/04/2018   Cataract cortical, senile, bilateral 05/11/2017   Primary osteoarthritis of right knee 11/02/2015   Vitamin D deficiency 08/14/2014   Rectal/anal hemorrhage 06/14/2014   Hypothyroidism 06/14/2014   Hypertension 06/14/2014   Hypercholesteremia 06/14/2014   TIA (transient ischemic attack) 06/14/2014   Gastroesophageal reflux disease 06/14/2014   Alopecia 06/14/2014   Malignant melanoma of foot ([place]) 06/14/2014   Irritable bowel 06/14/2014   Coronary artery disease 06/14/2014   Diabetes mellitus, type 2 ([place]) 06/14/2014   Carpal tunnel syndrome 06/14/2014   Procidentia of uterus 06/14/2014  Osteoporosis 06/14/2014   History of adenomatous polyp of colon 06/14/2014   B12 deficiency 08/08/2008   Colon, diverticulosis 05/14/2008    Past Surgical History:  Procedure Laterality Date   BREAST EXCISIONAL BIOPSY Right    Benign   CARDIAC CATHETERIZATION  10/2010   +stenting to LAD and RCA; symptoms: chest pain, elevated Troponin, +AMI. [place] W/PHACO Right 07/28/2016   Procedure: CATARACT EXTRACTION PHACO AND INTRAOCULAR LENS PLACEMENT ([place])  Right Diabetic;  Surgeon:  Leandrew Koyanagi, MD;  Location: [facility];  Service: Ophthalmology;  Laterality: Right;  Diabetic leave patient at 9 arrival   CATARACT EXTRACTION W/PHACO Left 10/27/2016   Procedure: CATARACT EXTRACTION PHACO AND INTRAOCULAR LENS PLACEMENT ([place]) LEFT DIABETIC;  Surgeon: Leandrew Koyanagi, MD;  Location: [facility];  Service: Ophthalmology;  Laterality: Left;   COLONOSCOPY  03/2008   2 polyps, internal and external hemorrhoids, +anal growth. Dr. Vira Agar   COLONOSCOPY WITH PROPOFOL N/A 05/12/2018   Procedure: COLONOSCOPY WITH PROPOFOL;  Surgeon: Manya Silvas, MD;  Location: [facility] ENDOSCOPY;  Service: Endoscopy;  Laterality: N/A;   ESOPHAGOGASTRODUODENOSCOPY (EGD) WITH PROPOFOL N/A 05/12/2018   Procedure: ESOPHAGOGASTRODUODENOSCOPY (EGD) WITH PROPOFOL;  Surgeon: Manya Silvas, MD;  Location: [facility] ENDOSCOPY;  Service: Endoscopy;  Laterality: N/A;   KNEE SURGERY     TONSILLECTOMY     as a child    Prior to Admission medications   Medication Sig Start Date End Date Taking? Authorizing Provider  calcium carbonate (OS-CAL) 1250 (500 Ca) MG chewable tablet Chew 1 tablet by mouth 2 (two) times daily.    [provider]  clopidogrel (PLAVIX) 75 MG tablet Take 75 mg by mouth daily. 03/20/14   [provider]  estradiol (ESTRACE) 0.1 MG/GM vaginal cream Place 1 Applicatorful vaginally once a week. 03/20/14   [provider]  ferrous sulfate 325 (65 FE) MG tablet Take 1 tablet (325 mg total) by mouth 2 (two) times daily with a meal. Patient not taking: Reported on 04/14/2018 02/22/18 04/23/18  Henreitta Leber, MD  glucose blood (ONE TOUCH ULTRA TEST) test strip USE TO CHECK BLOOD SUGAR DAILY. E 11.9 (TYPE 2 DIABETES MELLITIS) 01/27/18   Birdie Sons, MD  levothyroxine (SYNTHROID, LEVOTHROID) 125 MCG tablet TAKE 1 TABLET BY MOUTH IN THE MORNING ON AN EMPTY STOMACH FOR THYROID 10/27/17   Birdie Sons, MD  lisinopril-hydrochlorothiazide  (PRINZIDE,ZESTORETIC) 10-12.5 MG tablet Take 1 tablet by mouth daily. 04/19/18   Birdie Sons, MD  metFORMIN (GLUCOPHAGE) 1000 MG tablet Take 1 tablet (1,000 mg total) by mouth daily after supper. Patient taking differently: Take 1,000 mg by mouth daily with breakfast.  03/24/18   Birdie Sons, MD  metoprolol (LOPRESSOR) 50 MG tablet Take 50 mg by mouth 2 (two) times daily.  03/20/14   [provider]  Multiple Vitamins-Minerals (PRESERVISION AREDS 2 PO) Take 2 capsules by mouth daily.    [provider]  nitroGLYCERIN (NITROSTAT) 0.4 MG SL tablet Place 1 tablet (0.4 mg total) under the tongue every 5 (five) minutes as needed for chest pain. 09/29/15   Birdie Sons, MD  pantoprazole (PROTONIX) 40 MG tablet Take by mouth. 03/09/18   [provider]  simvastatin (ZOCOR) 40 MG tablet TAKE 1 TABLET BY MOUTH EVERY DAY FOR CHOLESTEROL 06/03/17   Birdie Sons, MD  vitamin B-12 (CYANOCOBALAMIN) 1000 MCG tablet Take 1,000 mcg by mouth daily.     [provider]    Allergies Patient  has no known allergies.  Family History  Problem Relation Age of Onset   Lung cancer Brother    Diabetes Father    Heart disease Sister        Valvular rheumatic heart disease   Rheumatic fever Sister    Heart disease Paternal Grandmother    Breast cancer Neg Hx    Ovarian cancer Neg Hx    Colon cancer Neg Hx     Social History Social History   Tobacco Use   Smoking status: Former Smoker    Last attempt to quit: 03/03/1988    Years since quitting: 30.2   Smokeless tobacco: Never Used  Substance Use Topics   Alcohol use: No    Alcohol/week: 0.0 standard drinks   Drug use: No    Review of Systems Constitutional: No fever/chills Eyes: No visual changes. ENT: No sore throat. Cardiovascular: +chest pain. Respiratory: Denies shortness of breath. Gastrointestinal: No abdominal pain.  No nausea, no vomiting.  No diarrhea.  No  constipation. Genitourinary: Negative for dysuria. Musculoskeletal: Negative for neck pain.  Negative for back pain. Integumentary: Negative for rash. Neurological: Negative for headaches, focal weakness or numbness.   ____________________________________________   PHYSICAL EXAM:  VITAL SIGNS: ED Triage Vitals  Enc Vitals Group     BP 06/08/18 2225 (!) 155/88     Pulse Rate 06/08/18 2225 69     Resp 06/08/18 2225 20     Temp 06/08/18 2225 98.4 F (36.9 C)     Temp Source 06/08/18 2225 Oral     SpO2 06/08/18 2225 94 %     Weight 06/08/18 2222 65.3 kg (144 lb)     Height 06/08/18 2222 1.626 m (5\' 4" )     Head Circumference --      Peak Flow --      Pain Score 06/08/18 2222 7     Pain Loc --      Pain Edu? --      Excl. in [place]? --     Constitutional: Alert and oriented. Well appearing and in no acute distress. Eyes: Conjunctivae are normal.  Head: Atraumatic. Nose: No congestion/rhinnorhea. Mouth/Throat: Mucous membranes are moist. Neck: No stridor.  No meningeal signs.   Cardiovascular: Normal rate, regular rhythm. Good peripheral circulation. Grossly normal heart sounds. Respiratory: Normal respiratory effort.  No retractions.  Gastrointestinal: Soft and nontender. No distention.  Musculoskeletal: No lower extremity tenderness nor edema. No gross deformities of extremities. Neurologic:  Normal speech and language. No gross focal neurologic deficits are appreciated.  Skin:  Skin is warm, dry and intact. No rash noted. Psychiatric: Mood and affect are normal. Speech and behavior are normal.  ____________________________________________   LABS (all labs ordered are listed, but only abnormal results are displayed)  Labs Reviewed  CBC WITH DIFFERENTIAL/PLATELET - Abnormal; Notable for the following components:      Result Value   Eosinophils Absolute 0.7 (*)    All other components within normal limits  COMPREHENSIVE METABOLIC PANEL - Abnormal; Notable for the  following components:   Glucose, Bld 239 (*)    All other components within normal limits  LIPASE, BLOOD - Abnormal; Notable for the following components:   Lipase 73 (*)    All other components within normal limits  URINALYSIS, ROUTINE W REFLEX MICROSCOPIC - Abnormal; Notable for the following components:   Color, Urine STRAW (*)    APPearance CLEAR (*)    Specific Gravity, Urine 1.032 (*)    Glucose, UA 50 (*)  All other components within normal limits  URINE CULTURE  TROPONIN I  TROPONIN I   ____________________________________________  EKG  ED ECG REPORT I, Hinda Kehr, the attending physician, personally viewed and interpreted this ECG.  Date: 06/08/2018 EKG Time: 22: 21 Rate: 69 Rhythm: normal sinus rhythm QRS Axis: normal Intervals: Nonspecific intraventricular block ST/T Wave abnormalities: Non-specific ST segment / T-wave changes, but no clear evidence of acute ischemia. Narrative Interpretation: no definitive evidence of acute ischemia; does not meet STEMI criteria.   ____________________________________________  RADIOLOGY  ED MD interpretation: No acute abnormalities in the CT scan of the abdomen pelvis except for some mottling enhancement of the kidneys worse on the right which the radiologist thought could be pyelonephritis.  No acute abnormalities on chest x-ray.  Official radiology report(s): Ct Abdomen Pelvis W Contrast  Result Date: 06/09/2018 CLINICAL DATA:  Left-sided chest and back pain EXAM: CT ABDOMEN AND PELVIS WITH CONTRAST TECHNIQUE: Multidetector CT imaging of the abdomen and pelvis was performed using the standard protocol following bolus administration of intravenous contrast. CONTRAST:  169mL OMNIPAQUE 300 COMPARISON:  02/21/2018 FINDINGS: Lower chest: Lung bases are free of acute infiltrate or sizable effusion. Moderate-sized hiatal hernia is noted. Hepatobiliary: Fatty infiltration of the liver is noted. The gallbladder is within normal  limits. Pancreas: Unremarkable. No pancreatic ductal dilatation or surrounding inflammatory changes. Spleen: Normal in size without focal abnormality. Adrenals/Urinary Tract: Adrenal glands are within normal limits. Kidneys are well visualized bilaterally. No renal calculi or urinary tract obstructive changes are seen. There is a mottled enhancement pattern somewhat worse in the right kidney than the left kidney consistent with changes of pyelonephritis. The bladder is well distended. Stomach/Bowel: Scattered diverticular change of the colon is noted. No obstructive or inflammatory changes are seen. The appendix is within normal limits. No small bowel or gastric abnormality is noted with the exception of the of previously mentioned hiatal hernia. Vascular/Lymphatic: Aortic atherosclerosis. No enlarged abdominal or pelvic lymph nodes. Reproductive: Uterus is within normal limits. A pessary is noted in place. Other: No abdominal wall hernia or abnormality. No abdominopelvic ascites. Musculoskeletal: Degenerative changes of lumbar spine are noted. No acute bony abnormality is seen. IMPRESSION: Mottled enhancement pattern within the kidneys worse on the right consistent with pyelonephritis. Diverticulosis without diverticulitis. Electronically Signed   By: Inez Catalina M.D.   On: 06/09/2018 01:21   Dg Chest Port 1 View  Result Date: 06/08/2018 CLINICAL DATA:  Left upper back pain EXAM: PORTABLE CHEST 1 VIEW COMPARISON:  02/19/2018 FINDINGS: Cardiac shadow is enlarged but stable. Aortic calcifications are again seen. Mild scarring is noted in the left base. No focal infiltrate or effusion is seen. No bony abnormality is noted. IMPRESSION: No acute abnormality seen. Electronically Signed   By: Inez Catalina M.D.   On: 06/08/2018 23:44    ____________________________________________   PROCEDURES   Procedure(s) performed (including Critical  Care):  Procedures   ____________________________________________   INITIAL IMPRESSION / MDM / [place] / ED COURSE  As part of my medical decision making, I reviewed the following data within the [place] notes reviewed and incorporated, Labs reviewed , EKG interpreted , Old chart reviewed, Radiograph reviewed  and Notes from prior ED visits  Darl Brisbin Hoque was evaluated in Emergency Department on 06/09/2018 for the symptoms described in the history of present illness. She was evaluated in the context of the global COVID-19 pandemic, which necessitated consideration that the patient might be at risk for infection  with the SARS-CoV-2 virus that causes COVID-19. Institutional protocols and algorithms that pertain to the evaluation of patients at risk for COVID-19 are in a state of rapid change based on information released by regulatory bodies including the CDC and federal and state organizations. These policies and algorithms were followed during the patient's care in the ED.      Differential diagnosis includes, but is not limited to, musculoskeletal pain, biliary colic/pancreatitis, gastritis or GERD, ACS, PE, pneumonia.  Patient has no signs or symptoms concerning for COVID-19.  Troponin, Compazine metabolic panel, and lipase are pending.  CBC is normal with no leukocytosis.  Vital signs are stable with no tachycardia and no fever.  The patient is in no distress and is not currently having chest pain and her symptoms started more than 12 hours ago which is generally reassuring.  I will await the rest of the lab work-up and then determine what additional work-up is necessary.  Patient is having no respiratory issues at this time.  Clinical Course as of Jun 09 403  Fri Jun 09, 2018  0057 Patient's lab work-up is been reassuring except for a lipase of 73.  Based on the nature of her pain, a pancreatitis could be causing it.  I am sending her for a CT of the  abdomen and pelvis with IV contrast only for further evaluation of the pancreas; given her age, I do not want to miss a pancreatitis that is significant enough to show CT changes.   [CF]  2836 Troponin I: <0.03 [CF]  6294 CBC is normal with no leukocytosis and no anemia.  Comprehensive metabolic panel is within normal limits except for an elevated glucose.   [CF]  0148 No evidence of pancreatitis on the CT scan but the radiologist mentioned that it looks like she has pyelonephritis.  I asked the patient about this and she said she has had some dysuria and she uses a pessary which she feels may be the source of the issue.  I have asked her to give a urine specimen.  We are checking a second troponin.  She has had no chest pain since coming to the emergency department.  She very much wants to go home, particularly in the setting of the COVID-19 pandemic, and I think that is appropriate based on her reassuring work-up if her second troponin remains negative.  I do want to treat her for possible pyelonephritis, however, so she understands the importance of getting a urine specimen.  CT ABDOMEN PELVIS W CONTRAST [CF]  0223 Second troponin was negative.  Awaiting UA.  Troponin I: <0.03 [CF]  0223 Giving 565mL NS IV bolus to help clean IV contrast from system as well as to encourage urination for analysis.   [CF]  0359 Urinalysis is clear.  Urine culture is pending.  Given the radiology suggestion of pyelonephritis but no objective data to support this diagnosis, including no fever, tachycardia, abnormal findings on urinalysis, leukocytosis, nor evidence of acute kidney injury, I will not prescribe empiric antibiotics.  However I am encouraging her to schedule an appointment with nephrology for follow-up given the abnormal appearance of her kidneys on the CT scan.  The patient has been stable for almost 6 hours in the emergency department with no chest pain and is appropriate for discharge and close follow-up.    [CF]  0400  I gave my usual and customary return precautions.   [CF]    Clinical Course User Index [CF] Hinda Kehr, MD  ____________________________________________  FINAL CLINICAL IMPRESSION(S) / ED DIAGNOSES  Final diagnoses:  Chest pain  Elevated lipase     MEDICATIONS GIVEN DURING THIS VISIT:  Medications  iohexol (OMNIPAQUE) 300 MG/ML solution 100 mL (100 mLs Intravenous Contrast Given 06/09/18 0055)  sodium chloride 0.9 % bolus 500 mL (0 mLs Intravenous Stopped 06/09/18 0322)     ED Discharge Orders    None       Note:  This document was prepared using Dragon voice recognition software and may include unintentional dictation errors.   Hinda Kehr, MD 06/09/18 [phone]

## 2018-06-08 NOTE — ED Notes (Addendum)
Updated daughter on plan of care April Pittman [phone])

## 2018-06-08 NOTE — ED Triage Notes (Addendum)
Pt in with co left upper back pain that now radiates to chest. Pt has hx of MI in the past. Pt took nitro x 1 at home with some relief.

## 2018-06-09 ENCOUNTER — Telehealth: Payer: Self-pay

## 2018-06-09 ENCOUNTER — Emergency Department: Payer: PPO

## 2018-06-09 DIAGNOSIS — K5790 Diverticulosis of intestine, part unspecified, without perforation or abscess without bleeding: Secondary | ICD-10-CM | POA: Diagnosis not present

## 2018-06-09 LAB — URINALYSIS, ROUTINE W REFLEX MICROSCOPIC
Bilirubin Urine: NEGATIVE
Glucose, UA: 50 mg/dL — AB
Hgb urine dipstick: NEGATIVE
Ketones, ur: NEGATIVE mg/dL
Leukocytes,Ua: NEGATIVE
Nitrite: NEGATIVE
Protein, ur: NEGATIVE mg/dL
Specific Gravity, Urine: 1.032 — ABNORMAL HIGH (ref 1.005–1.030)
pH: 7 (ref 5.0–8.0)

## 2018-06-09 LAB — TROPONIN I: Troponin I: <0.03 ng/mL (ref <0.03)

## 2018-06-09 MED ORDER — SODIUM CHLORIDE 0.9 % IV BOLUS
500.0000 mL | Freq: Once | INTRAVENOUS | Status: AC
  Administered 2018-06-09: 500 mL via INTRAVENOUS

## 2018-06-09 MED ORDER — IOHEXOL 300 MG/ML  SOLN
100.0000 mL | Freq: Once | INTRAMUSCULAR | Status: AC | PRN
  Administered 2018-06-09: 100 mL via INTRAVENOUS

## 2018-06-09 NOTE — Discharge Instructions (Addendum)
As we discussed, your work-up was reassuring today including your EKG, 2 troponins, and all the rest of your lab work.  Your lipase was a little bit elevated, but your CT scan of the abdomen and pelvis was normal with no evidence of pancreatitis.  The radiologist thought it is possible you may have a kidney infection (pyelonephritis), but your urine was clean (without infection) and your vitals and lab work are all normal.  We will not start you on antibiotics at this time, but I encourage you to discuss your CT scan with your regular doctor, or call the office of Dr. Holley Raring (a kidney specialist) and ask to schedule a an ED follow up appointment due to your abnormal CT results.   We also recommend that you contact Dr. Ubaldo Glassing to discuss your chest pain with him.  Return to the emergency department if you develop new or worsening symptoms that concern you.

## 2018-06-09 NOTE — Telephone Encounter (Signed)
LM to call the office to reschedule her appointment with [facility].

## 2018-06-10 LAB — URINE CULTURE
Culture: <10000 — AB
Special Requests: NORMAL

## 2018-06-12 ENCOUNTER — Telehealth: Payer: Self-pay | Admitting: Family Medicine

## 2018-06-12 MED ORDER — CEPHALEXIN 500 MG PO CAPS: 500.0000 mg | ORAL_CAPSULE | Freq: Four times a day (QID) | ORAL | 0 refills | Status: AC

## 2018-06-12 NOTE — Telephone Encounter (Signed)
Pt called saying she went to the ER last week for pain in her side and she is still hurting.  She said The ER doctor was going to call Dr. Caryn Section but she has not heard anything from out office.  She does have an appt next week. She said they think from test it is kidney related.  Please call patient at [phone]  Thanks teri

## 2018-06-12 NOTE — Telephone Encounter (Signed)
Some indications of kidney infection on abdominal CT, although urine cultures were negative. Go ahead and start antibiotic. Have sent prescription for cephalexin to  [facility] in [place]. Follow up o.v. on 06-21-2018 as scheduled.

## 2018-06-12 NOTE — Telephone Encounter (Signed)
Please advise 

## 2018-06-13 NOTE — Telephone Encounter (Signed)
Patient advised and agrees with treatment plan.

## 2018-06-14 ENCOUNTER — Encounter: Payer: PPO | Admitting: Obstetrics and Gynecology

## 2018-06-20 ENCOUNTER — Other Ambulatory Visit: Payer: Self-pay | Admitting: Family Medicine

## 2018-06-20 NOTE — Telephone Encounter (Signed)
It has been a year since Lipids were checked. Patient has appointment for office visit tomorrow. Would you like to refill this now, or wait for labs before we refill medication.

## 2018-06-20 NOTE — Telephone Encounter (Signed)
[facility] faxed refill request for the following medications:  simvastatin (ZOCOR) 40 MG tablet    Please advise.  Thanks, [employer]

## 2018-06-21 ENCOUNTER — Other Ambulatory Visit: Payer: Self-pay

## 2018-06-21 ENCOUNTER — Encounter: Payer: Self-pay | Admitting: Family Medicine

## 2018-06-21 ENCOUNTER — Ambulatory Visit (INDEPENDENT_AMBULATORY_CARE_PROVIDER_SITE_OTHER): Payer: PPO | Admitting: Family Medicine

## 2018-06-21 VITALS — BP 152/77 | HR 73 | Temp 98.5°F | Resp 18 | Wt 144.0 lb

## 2018-06-21 DIAGNOSIS — R748 Abnormal levels of other serum enzymes: Secondary | ICD-10-CM

## 2018-06-21 DIAGNOSIS — E559 Vitamin D deficiency, unspecified: Secondary | ICD-10-CM | POA: Diagnosis not present

## 2018-06-21 DIAGNOSIS — E119 Type 2 diabetes mellitus without complications: Secondary | ICD-10-CM

## 2018-06-21 DIAGNOSIS — E7800 Pure hypercholesterolemia, unspecified: Secondary | ICD-10-CM

## 2018-06-21 DIAGNOSIS — R079 Chest pain, unspecified: Secondary | ICD-10-CM | POA: Diagnosis not present

## 2018-06-21 NOTE — Progress Notes (Signed)
Patient: April Pittman Female    DOB: Apr 02, 1940   78 y.o.   MRN: [id number] Visit Date: 06/21/2018  Today's Provider: [name], MD   Chief Complaint  Patient presents with  . Follow-up   Subjective:     HPI  Follow up ER  Patient was admitted to [facility] ER on 06/08/2018 and discharged on 06/09/2018. She was treated for chest pain. She reports good compliance with treatment. Patient was started on antibiotics to treat for indication of kidney function shown on abdominal CT. She reports this condition is Improved. She has since started on cephalexin, however urine cultures were negative. She was also noted to have a mildly elevated lipase. Seriel troponins were negative.   ---------------------------------------------------------------------------    No Known Allergies   Current Outpatient Medications:  .  calcium carbonate (OS-CAL) 1250 (500 Ca) MG chewable tablet, Chew 1 tablet by mouth 2 (two) times daily., Disp: , Rfl:  .  cephALEXin (KEFLEX) 500 MG capsule, Take 1 capsule (500 mg total) by mouth 4 (four) times daily for 10 days., Disp: 40 capsule, Rfl: 0 .  clopidogrel (PLAVIX) 75 MG tablet, Take 75 mg by mouth daily., Disp: , Rfl:  .  estradiol (ESTRACE) 0.1 MG/GM vaginal cream, Place 1 Applicatorful vaginally once a week., Disp: , Rfl:  .  glucose blood (ONE TOUCH ULTRA TEST) test strip, USE TO CHECK BLOOD SUGAR DAILY. E 11.9 (TYPE 2 DIABETES MELLITIS), Disp: 100 each, Rfl: 4 .  levothyroxine (SYNTHROID, LEVOTHROID) 125 MCG tablet, TAKE 1 TABLET BY MOUTH IN THE MORNING ON AN EMPTY STOMACH FOR THYROID, Disp: 90 tablet, Rfl: 3 .  lisinopril-hydrochlorothiazide (PRINZIDE,ZESTORETIC) 10-12.5 MG tablet, Take 1 tablet by mouth daily., Disp: 90 tablet, Rfl: 3 .  metFORMIN (GLUCOPHAGE) 1000 MG tablet, Take 1 tablet (1,000 mg total) by mouth daily after supper. (Patient taking differently: Take 1,000 mg by mouth daily with breakfast. ), Disp: 3 tablet, Rfl: 3 .   metoprolol (LOPRESSOR) 50 MG tablet, Take 50 mg by mouth 2 (two) times daily. , Disp: , Rfl:  .  Multiple Vitamins-Minerals (PRESERVISION AREDS 2 PO), Take 2 capsules by mouth daily., Disp: , Rfl:  .  nitroGLYCERIN (NITROSTAT) 0.4 MG SL tablet, Place 1 tablet (0.4 mg total) under the tongue every 5 (five) minutes as needed for chest pain., Disp: 30 tablet, Rfl: 0 .  pantoprazole (PROTONIX) 40 MG tablet, Take by mouth., Disp: , Rfl:  .  simvastatin (ZOCOR) 40 MG tablet, TAKE 1 TABLET BY MOUTH EVERY DAY FOR CHOLESTEROL, Disp: 30 tablet, Rfl: 12 .  vitamin B-12 (CYANOCOBALAMIN) 1000 MCG tablet, Take 1,000 mcg by mouth daily. , Disp: , Rfl:  .  ferrous sulfate 325 (65 FE) MG tablet, Take 1 tablet (325 mg total) by mouth 2 (two) times daily with a meal. (Patient not taking: Reported on 04/14/2018), Disp: 60 tablet, Rfl: 1  Review of Systems  Constitutional: Negative for appetite change, chills, fatigue and fever.  Respiratory: Negative for chest tightness and shortness of breath.   Cardiovascular: Negative for chest pain and palpitations.  Gastrointestinal: Negative for abdominal pain, nausea and vomiting.  Musculoskeletal: Positive for back pain (left side upper back).  Neurological: Negative for dizziness and weakness.    Social History   Tobacco Use  . Smoking status: Former Smoker    Last attempt to quit: 03/03/1988    Years since quitting: 30.3  . Smokeless tobacco: Never Used  Substance Use Topics  . Alcohol use: No  Alcohol/week: 0.0 standard drinks      Objective:   BP (!) 152/77 (BP Location: Left Arm, Cuff Size: Normal)   Pulse 73   Temp 98.5 F (36.9 C) (Oral)   Resp 18   Wt 144 lb (65.3 kg)   SpO2 95% Comment: room air  BMI 24.72 kg/m  Vitals:   06/21/18 1007 06/21/18 1010  BP: (!) 146/77 (!) 152/77  Pulse: 73   Resp: 18   Temp: 98.5 F (36.9 C)   TempSrc: Oral   SpO2: 95%   Weight: 144 lb (65.3 kg)      Physical Exam  General Appearance:    Alert,  cooperative, no distress  Eyes:    PERRL, conjunctiva/corneas clear, EOM's intact       Lungs:     Clear to auscultation bilaterally, respirations unlabored  Heart:    Regular rate and rhythm  Abdomen:   bowel sounds present and normal in all 4 quadrants, soft, round or nontender. No CVA tenderness       Assessment & Plan    1. Elevated lipase  - Lipase  2. Type 2 diabetes mellitus without complication, without long-term current use of insulin (HCC)  - Hemoglobin A1c  3. Hypercholesteremia Doing well on lovastatin.  - Lipid panel  4. Vitamin D deficiency check- VITAMIN D 25 Hydroxy (Vit-D Deficiency, Fractures)  5. Chest pain, unspecified type Now resolved, was likely secondary to reflux. Negative cardiac work up.     The entirety of the information documented in the History of Present Illness, Review of Systems and Physical Exam were personally obtained by me. Portions of this information were initially documented by Meyer Cory, CMA and reviewed by me for thoroughness and accuracy.   [name], MD  [facility]

## 2018-06-21 NOTE — Patient Instructions (Signed)
.   Please review the attached list of medications and notify my office if there are any errors.   . Please bring all of your medications to every appointment so we can make sure that our medication list is the same as yours.    Check your blood pressure at home three times a week. Let me know if the top number consistently stays over 140

## 2018-06-22 LAB — LIPID PANEL
Chol/HDL Ratio: 3.5 ratio (ref 0.0–4.4)
Cholesterol, Total: 202 mg/dL — ABNORMAL HIGH (ref 100–199)
HDL: 57 mg/dL (ref >39)
LDL Calculated: 90 mg/dL (ref 0–99)
Triglycerides: 276 mg/dL — ABNORMAL HIGH (ref 0–149)
VLDL Cholesterol Cal: 55 mg/dL — ABNORMAL HIGH (ref 5–40)

## 2018-06-22 LAB — HEMOGLOBIN A1C
Est. average glucose Bld gHb Est-mCnc: 186 mg/dL
Hgb A1c MFr Bld: 8.1 % — ABNORMAL HIGH (ref 4.8–5.6)

## 2018-06-22 LAB — VITAMIN D 25 HYDROXY (VIT D DEFICIENCY, FRACTURES): Vit D, 25-Hydroxy: 24.9 ng/mL — ABNORMAL LOW (ref 30.0–100.0)

## 2018-06-22 LAB — LIPASE: Lipase: 43 U/L (ref 14–85)

## 2018-06-22 MED ORDER — SIMVASTATIN 40 MG PO TABS: ORAL_TABLET | ORAL | 12 refills | Status: DC

## 2018-06-23 ENCOUNTER — Telehealth: Payer: Self-pay

## 2018-06-23 NOTE — Telephone Encounter (Signed)
Pt's sister Fraser Din advised.  (On DPR)   Thanks,   -Mickel Baas

## 2018-06-23 NOTE — Telephone Encounter (Signed)
-----   Message from Birdie Sons, MD sent at 06/22/2018  9:42 AM EDT ----- a1c is up to 8.1. cholesterol is up a little bit to 202. Vitamin d levels a little low but stable. Lipase level is back to normal. Continue current medications.  Try to cut back on sweets and starchy foods. Recheck lipids and a1c at CPE in October.

## 2018-07-11 DIAGNOSIS — H353221 Exudative age-related macular degeneration, left eye, with active choroidal neovascularization: Secondary | ICD-10-CM | POA: Diagnosis not present

## 2018-07-11 DIAGNOSIS — H353211 Exudative age-related macular degeneration, right eye, with active choroidal neovascularization: Secondary | ICD-10-CM | POA: Diagnosis not present

## 2018-07-14 ENCOUNTER — Other Ambulatory Visit: Payer: Self-pay | Admitting: *Deleted

## 2018-07-14 ENCOUNTER — Telehealth: Payer: Self-pay | Admitting: Family Medicine

## 2018-07-14 MED ORDER — FERROUS SULFATE 325 (65 FE) MG PO TBEC: 325.0000 mg | DELAYED_RELEASE_TABLET | Freq: Two times a day (BID) | ORAL | 3 refills | Status: DC

## 2018-07-14 NOTE — Telephone Encounter (Signed)
No she does not need to take it anymore. Her  Blood count is back up to normal.

## 2018-07-14 NOTE — Telephone Encounter (Signed)
pt is confused as to whether she needs to be taking the iron prescription or not.  Please call her back  [phone]  Thanks teri

## 2018-07-18 NOTE — Telephone Encounter (Signed)
Patient advised.

## 2018-08-01 ENCOUNTER — Telehealth: Payer: Self-pay

## 2018-08-01 NOTE — Telephone Encounter (Signed)
Pt prescreened no symptoms has face mask.   Coronavirus (COVID-19) Are you at risk?  Are you at risk for the Coronavirus (COVID-19)?  To be considered HIGH RISK for Coronavirus (COVID-19), you have to meet the following criteria:  . Traveled to [place], [place], [place], [place] or [place]; or in the [place] to [place], [place], [place], or [place]; and have fever, cough, and shortness of breath within the last 2 weeks of travel OR . Been in close contact with a person diagnosed with COVID-19 within the last 2 weeks and have fever, cough, and shortness of breath . IF YOU DO NOT MEET THESE CRITERIA, YOU ARE CONSIDERED LOW RISK FOR COVID-19.  What to do if you are HIGH RISK for COVID-19?  . If you are having a medical emergency, call 911. . Seek medical care right away. Before you go to a doctor's office, urgent care or emergency department, call ahead and tell them about your recent travel, contact with someone diagnosed with COVID-19, and your symptoms. You should receive instructions from your physician's office regarding next steps of care.  . When you arrive at healthcare provider, tell the healthcare staff immediately you have returned from visiting [place], [place], [place], [place] or [place]; or traveled in the [place] to [place], [place], [place], or [place]; in the last two weeks or you have been in close contact with a person diagnosed with COVID-19 in the last 2 weeks.   . Tell the health care staff about your symptoms: fever, cough and shortness of breath. . After you have been seen by a medical provider, you will be either: o Tested for (COVID-19) and discharged home on quarantine except to seek medical care if symptoms worsen, and asked to  - Stay home and avoid contact with others until you get your results (4-5 days)  - Avoid travel on public transportation if possible (such as bus, train, or airplane) or o Sent to the Emergency Department by EMS for  evaluation, COVID-19 testing, and possible admission depending on your condition and test results.  What to do if you are LOW RISK for COVID-19?  Reduce your risk of any infection by using the same precautions used for avoiding the common cold or flu:  . Wash your hands often with soap and warm water for at least 20 seconds.  If soap and water are not readily available, use an alcohol-based hand sanitizer with at least 60% alcohol.  . If coughing or sneezing, cover your mouth and nose by coughing or sneezing into the elbow areas of your shirt or coat, into a tissue or into your sleeve (not your hands). . Avoid shaking hands with others and consider head nods or verbal greetings only. . Avoid touching your eyes, nose, or mouth with unwashed hands.  . Avoid close contact with people who are sick. . Avoid places or events with large numbers of people in one location, like concerts or sporting events. . Carefully consider travel plans you have or are making. . If you are planning any travel outside or inside the [place], visit the CDC's Travelers' Health webpage for the latest health notices. . If you have some symptoms but not all symptoms, continue to monitor at home and seek medical attention if your symptoms worsen. . If you are having a medical emergency, call 911.   ADDITIONAL HEALTHCARE OPTIONS FOR PATIENTS  [facility] / e-Visit: [email or web address]           [facility]: [phone]  [facility]: [phone]                   [facility]: [phone]  

## 2018-08-01 NOTE — Progress Notes (Deleted)
Pt is present today for pessary maintenance. Pt stated

## 2018-08-02 ENCOUNTER — Encounter: Payer: PPO | Admitting: Obstetrics and Gynecology

## 2018-08-14 ENCOUNTER — Other Ambulatory Visit: Payer: Self-pay

## 2018-08-14 ENCOUNTER — Inpatient Hospital Stay: Payer: PPO | Attending: Oncology

## 2018-08-14 ENCOUNTER — Telehealth: Payer: Self-pay | Admitting: Family Medicine

## 2018-08-14 DIAGNOSIS — Z803 Family history of malignant neoplasm of breast: Secondary | ICD-10-CM | POA: Insufficient documentation

## 2018-08-14 DIAGNOSIS — E039 Hypothyroidism, unspecified: Secondary | ICD-10-CM | POA: Insufficient documentation

## 2018-08-14 DIAGNOSIS — D509 Iron deficiency anemia, unspecified: Secondary | ICD-10-CM | POA: Diagnosis not present

## 2018-08-14 DIAGNOSIS — Z7984 Long term (current) use of oral hypoglycemic drugs: Secondary | ICD-10-CM | POA: Insufficient documentation

## 2018-08-14 DIAGNOSIS — I10 Essential (primary) hypertension: Secondary | ICD-10-CM | POA: Insufficient documentation

## 2018-08-14 DIAGNOSIS — Z8673 Personal history of transient ischemic attack (TIA), and cerebral infarction without residual deficits: Secondary | ICD-10-CM | POA: Diagnosis not present

## 2018-08-14 DIAGNOSIS — Z79899 Other long term (current) drug therapy: Secondary | ICD-10-CM | POA: Insufficient documentation

## 2018-08-14 DIAGNOSIS — Z85820 Personal history of malignant melanoma of skin: Secondary | ICD-10-CM | POA: Diagnosis not present

## 2018-08-14 DIAGNOSIS — E119 Type 2 diabetes mellitus without complications: Secondary | ICD-10-CM | POA: Insufficient documentation

## 2018-08-14 DIAGNOSIS — E7800 Pure hypercholesterolemia, unspecified: Secondary | ICD-10-CM | POA: Insufficient documentation

## 2018-08-14 DIAGNOSIS — Z87891 Personal history of nicotine dependence: Secondary | ICD-10-CM | POA: Insufficient documentation

## 2018-08-14 LAB — CBC WITH DIFFERENTIAL/PLATELET
Abs Immature Granulocytes: 0.03 10*3/uL (ref 0.00–0.07)
Basophils Absolute: 0.1 10*3/uL (ref 0.0–0.1)
Basophils Relative: 1 %
Eosinophils Absolute: 1.1 10*3/uL — ABNORMAL HIGH (ref 0.0–0.5)
Eosinophils Relative: 11 %
HCT: 38 % (ref 36.0–46.0)
Hemoglobin: 12.7 g/dL (ref 12.0–15.0)
Immature Granulocytes: 0 %
Lymphocytes Relative: 21 %
Lymphs Abs: 2.2 10*3/uL (ref 0.7–4.0)
MCH: 28 pg (ref 26.0–34.0)
MCHC: 33.4 g/dL (ref 30.0–36.0)
MCV: 83.9 fL (ref 80.0–100.0)
Monocytes Absolute: 1.1 10*3/uL — ABNORMAL HIGH (ref 0.1–1.0)
Monocytes Relative: 10 %
Neutro Abs: 6.1 10*3/uL (ref 1.7–7.7)
Neutrophils Relative %: 57 %
Platelets: 295 10*3/uL (ref 150–400)
RBC: 4.53 MIL/uL (ref 3.87–5.11)
RDW: 13 % (ref 11.5–15.5)
WBC: 10.5 10*3/uL (ref 4.0–10.5)
nRBC: 0 % (ref 0.0–0.2)

## 2018-08-14 LAB — FERRITIN: Ferritin: 105 ng/mL (ref 11–307)

## 2018-08-14 LAB — IRON AND TIBC
Iron: 67 ug/dL (ref 28–170)
Saturation Ratios: 19 % (ref 10.4–31.8)
TIBC: 358 ug/dL (ref 250–450)
UIBC: 291 ug/dL

## 2018-08-14 NOTE — Chronic Care Management (AMB) (Signed)
Chronic Care Management   Note  08/14/2018 Name: CASSIDIE VEIGA MRN: [id number] DOB: [birth year]  April Pittman is a 78 y.o. year old female who is a primary care patient of Caryn Section, Kirstie Peri, MD. I reached out to [employer] by phone today in response to a referral sent by Ms. April Lore Kuznicki's health plan.    Ms. Krack was given information about Chronic Care Management services today including:  1. CCM service includes personalized support from designated clinical staff supervised by her physician, including individualized plan of care and coordination with other care providers 2. 24/7 contact phone numbers for assistance for urgent and routine care needs. 3. Service will only be billed when office clinical staff spend 20 minutes or more in a month to coordinate care. 4. Only one practitioner may furnish and bill the service in a calendar month. 5. The patient may stop CCM services at any time (effective at the end of the month) by phone call to the office staff. 6. The patient will be responsible for cost sharing (co-pay) of up to 20% of the service fee (after annual deductible is met).  Patient did not agree to enrollment in care management services and does not wish to consider at this time.  Follow up plan: The patient has been provided with contact information for the chronic care management team and has been advised to call with any health related questions or concerns.   [place]  ??[email or web address]   ??[phone]

## 2018-08-15 ENCOUNTER — Encounter: Payer: Self-pay | Admitting: Oncology

## 2018-08-15 ENCOUNTER — Inpatient Hospital Stay: Payer: PPO | Admitting: Oncology

## 2018-08-15 ENCOUNTER — Inpatient Hospital Stay: Payer: PPO

## 2018-08-15 ENCOUNTER — Other Ambulatory Visit: Payer: Self-pay

## 2018-08-15 VITALS — BP 135/76 | HR 73 | Temp 98.0°F | Wt 149.4 lb

## 2018-08-15 DIAGNOSIS — Z8673 Personal history of transient ischemic attack (TIA), and cerebral infarction without residual deficits: Secondary | ICD-10-CM

## 2018-08-15 DIAGNOSIS — Z803 Family history of malignant neoplasm of breast: Secondary | ICD-10-CM

## 2018-08-15 DIAGNOSIS — Z7984 Long term (current) use of oral hypoglycemic drugs: Secondary | ICD-10-CM | POA: Diagnosis not present

## 2018-08-15 DIAGNOSIS — D509 Iron deficiency anemia, unspecified: Secondary | ICD-10-CM | POA: Diagnosis not present

## 2018-08-15 DIAGNOSIS — Z85820 Personal history of malignant melanoma of skin: Secondary | ICD-10-CM

## 2018-08-15 DIAGNOSIS — E119 Type 2 diabetes mellitus without complications: Secondary | ICD-10-CM | POA: Diagnosis not present

## 2018-08-15 DIAGNOSIS — Z79899 Other long term (current) drug therapy: Secondary | ICD-10-CM | POA: Diagnosis not present

## 2018-08-15 DIAGNOSIS — E039 Hypothyroidism, unspecified: Secondary | ICD-10-CM | POA: Diagnosis not present

## 2018-08-15 DIAGNOSIS — E7800 Pure hypercholesterolemia, unspecified: Secondary | ICD-10-CM

## 2018-08-15 DIAGNOSIS — Z87891 Personal history of nicotine dependence: Secondary | ICD-10-CM | POA: Diagnosis not present

## 2018-08-15 DIAGNOSIS — I10 Essential (primary) hypertension: Secondary | ICD-10-CM | POA: Diagnosis not present

## 2018-08-17 NOTE — Progress Notes (Signed)
Hematology/Oncology Follow up note [facility] Telephone:([phone] Fax:([phone]) [phone]   Patient Care Team: Birdie Sons, MD as PCP - General (Family Medicine) Defrancesco, Alanda Slim, MD as Consulting Physician (Obstetrics and Gynecology) Teodoro Spray, MD as Consulting Physician (Cardiology) Leandrew Koyanagi, MD as Referring Physician (Ophthalmology) Manya Silvas, MD (Gastroenterology)  REFERRING PROVIDER: Dr.Sianani. REASON FOR VISIT:  Follow up for  iron deficiency anemia  HISTORY OF PRESENTING ILLNESS:  April Pittman is a  78 y.o.  female with PMH listed below who was referred to me for evaluation of iron deficiency anemia. Patient was recently admitted from 02/18/2018-02/22/2017 due to UTI/pyelonephritis, Sepsis. Treatment with broad spectrum of IV antibiotics with meropenem initially and then narrowed down to ceftriaxone. Urine culture positive for E coli. Also had acute respiratory failure with hypoxia, due to volume overload and pulmonary edema. Treated with IV lasix and responded well.   During the admission, she was found to have severe microcytic anemic, iron deficiency, received one dose of IV Venofer and was discharged with oral iron supplements. Referred to hematology for further evaluation.   Associated signs and symptoms: Patient reports fatigue.  Mild SOB with exertion.  Denies weight loss, easy bruising, hematochezia, hemoptysis, hematuria. Context:  Rectal bleeding: denies Menstrual bleeding/ Vaginal bleeding : deneis Hematemesis or hemoptysis : denies Blood in urine : denies  Last endoscopy: 09/28/2013 Colonoscopy showed internal hemorrhoids, no polyps. Recommend to repeat in 09/2018 due to polyps found on previous colonoscopy.  Fatigue: Yes.   INTERVAL HISTORY April Pittman is a 78 y.o. female who has above history reviewed by me today presents for follow up visit for management of iron deficiency anemia.  Patient was  last seen by me on 04/14/2018.  Patient has received 4 doses of IV Venofer. Reports feeling pretty well.  Fatigue has improved  She has received 3 doses of IV Venofer. She also has gained 4 pounds since last visit.  Fatigue has improved significantly.  She also has endoscopy and colonoscopy scheduled in March for further work up.   Patient had EGD and colonoscopy on 05/12/2018. 2 sessile polyps were found in the ascending colon and cecum.  Removed.  Internal hemorrhoids. EGD showed normal esophagus, small hiatal hernia.  Gastritis. No malignancy/dysplasia was discovered.  Review of Systems  Constitutional: Negative for appetite change, chills, fatigue, fever and unexpected weight change.  HENT:   Negative for hearing loss and voice change.   Eyes: Negative for eye problems.  Respiratory: Negative for chest tightness and cough.   Cardiovascular: Negative for chest pain.  Gastrointestinal: Negative for abdominal distention, abdominal pain, blood in stool, nausea, rectal pain and vomiting.  Endocrine: Negative for hot flashes.  Genitourinary: Negative for difficulty urinating and frequency.   Musculoskeletal: Negative for arthralgias.  Skin: Negative for itching and rash.  Neurological: Negative for extremity weakness.  Hematological: Negative for adenopathy.  Psychiatric/Behavioral: Negative for confusion.    MEDICAL HISTORY:  Past Medical History:  Diagnosis Date  . Alopecia   . Anginal pain ([place])   . Benign neoplasm of large bowel   . CAD (coronary artery disease)   . Carpal tunnel syndrome   . Complication of anesthesia    vomits with any pain medicine  . DM (diabetes mellitus) ([place])   . Erosion of cervix   . GERD (gastroesophageal reflux disease)   . H/O adenomatous polyp of colon    with sever dysplasia, removed 2000 by colonoscopy  . History of chicken pox   .  Hypercholesteremia   . Hypertension   . Hypothyroid   . Irritable bowel   . Malignant melanoma of foot ([place])    . Osteoporosis   . PONV (postoperative nausea and vomiting)   . Post-menopausal bleeding   . Procidentia of uterus    with traction cystocele, managed with gellhorn pessary  . Rectal bleeding   . Sepsis ([place]) 02/18/2018  . TIA (transient ischemic attack)     SURGICAL HISTORY: Past Surgical History:  Procedure Laterality Date  . BREAST EXCISIONAL BIOPSY Right    Benign  . CARDIAC CATHETERIZATION  10/2010   +stenting to LAD and RCA; symptoms: chest pain, elevated Troponin, +AMI. [employer]  . CATARACT EXTRACTION W/PHACO Right 07/28/2016   Procedure: CATARACT EXTRACTION PHACO AND INTRAOCULAR LENS PLACEMENT ([place])  Right Diabetic;  Surgeon: Leandrew Koyanagi, MD;  Location: [facility];  Service: Ophthalmology;  Laterality: Right;  Diabetic leave patient at 9 arrival  . CATARACT EXTRACTION W/PHACO Left 10/27/2016   Procedure: CATARACT EXTRACTION PHACO AND INTRAOCULAR LENS PLACEMENT ([place]) LEFT DIABETIC;  Surgeon: Leandrew Koyanagi, MD;  Location: [place];  Service: Ophthalmology;  Laterality: Left;  . COLONOSCOPY  03/2008   2 polyps, internal and external hemorrhoids, +anal growth. Dr. Vira Agar  . COLONOSCOPY WITH PROPOFOL N/A 05/12/2018   Procedure: COLONOSCOPY WITH PROPOFOL;  Surgeon: Manya Silvas, MD;  Location[facility] ENDOSCOPY;  Service: Endoscopy;  Laterality: N/A;  . ESOPHAGOGASTRODUODENOSCOPY (EGD) WITH PROPOFOL N/A 05/12/2018   Procedure: ESOPHAGOGASTRODUODENOSCOPY (EGD) WITH PROPOFOL;  Surgeon: Manya Silvas, MD;  Location: [facility] ENDOSCOPY;  Service: Endoscopy;  Laterality: N/A;  . KNEE SURGERY    . TONSILLECTOMY     as a child    SOCIAL HISTORY: Social History   Socioeconomic History  . Marital status: Married    Spouse name: Not on file  . Number of children: 3  . Years of education: Not on file  . Highest education level: GED or equivalent  Occupational History  . Occupation: Retired    Comment: former Counselling psychologist  .  Financial resource strain: Not hard at all  . Food insecurity    Worry: Never true    Inability: Never true  . Transportation needs    Medical: No    Non-medical: No  Tobacco Use  . Smoking status: Former Smoker    Quit date: 03/03/1988    Years since quitting: 30.4  . Smokeless tobacco: Never Used  Substance and Sexual Activity  . Alcohol use: No    Alcohol/week: 0.0 standard drinks  . Drug use: No  . Sexual activity: Not Currently  Lifestyle  . Physical activity    Days per week: Not on file    Minutes per session: Not on file  . Stress: To some extent  Relationships  . Social Herbalist on phone: Not on file    Gets together: Not on file    Attends religious service: Not on file    Active member of club or organization: Not on file    Attends meetings of clubs or organizations: Not on file    Relationship status: Not on file  . Intimate partner violence    Fear of current or ex partner: Not on file    Emotionally abused: Not on file    Physically abused: Not on file    Forced sexual activity: Not on file  Other Topics Concern  . Not on file  Social History Narrative  . Not  on file    FAMILY HISTORY: Family History  Problem Relation Age of Onset  . Lung cancer Brother   . Diabetes Father   . Heart disease Sister        Valvular rheumatic heart disease  . Rheumatic fever Sister   . Heart disease Paternal Grandmother   . Breast cancer Neg Hx   . Ovarian cancer Neg Hx   . Colon cancer Neg Hx     ALLERGIES:  has No Known Allergies.  MEDICATIONS:  Current Outpatient Medications  Medication Sig Dispense Refill  . calcium carbonate (OS-CAL) 1250 (500 Ca) MG chewable tablet Chew 1 tablet by mouth 2 (two) times daily.    . clopidogrel (PLAVIX) 75 MG tablet Take 75 mg by mouth daily.    Marland Kitchen estradiol (ESTRACE) 0.1 MG/GM vaginal cream Place 1 Applicatorful vaginally once a week.    . ferrous sulfate 325 (65 FE) MG EC tablet Take 1 tablet (325 mg total) by  mouth 2 (two) times daily with a meal. 60 tablet 3  . glucose blood (ONE TOUCH ULTRA TEST) test strip USE TO CHECK BLOOD SUGAR DAILY. E 11.9 (TYPE 2 DIABETES MELLITIS) 100 each 4  . levothyroxine (SYNTHROID, LEVOTHROID) 125 MCG tablet TAKE 1 TABLET BY MOUTH IN THE MORNING ON AN EMPTY STOMACH FOR THYROID 90 tablet 3  . lisinopril-hydrochlorothiazide (PRINZIDE,ZESTORETIC) 10-12.5 MG tablet Take 1 tablet by mouth daily. 90 tablet 3  . metFORMIN (GLUCOPHAGE) 1000 MG tablet Take 1 tablet (1,000 mg total) by mouth daily after supper. (Patient taking differently: Take 1,000 mg by mouth daily with breakfast. ) 3 tablet 3  . metoprolol (LOPRESSOR) 50 MG tablet Take 50 mg by mouth 2 (two) times daily.     . Multiple Vitamins-Minerals (PRESERVISION AREDS 2 PO) Take 2 capsules by mouth daily.    . nitroGLYCERIN (NITROSTAT) 0.4 MG SL tablet Place 1 tablet (0.4 mg total) under the tongue every 5 (five) minutes as needed for chest pain. 30 tablet 0  . pantoprazole (PROTONIX) 40 MG tablet Take by mouth.    . simvastatin (ZOCOR) 40 MG tablet TAKE 1 TABLET BY MOUTH EVERY DAY FOR CHOLESTEROL 30 tablet 12  . vitamin B-12 (CYANOCOBALAMIN) 1000 MCG tablet Take 1,000 mcg by mouth daily.      No current facility-administered medications for this visit.      PHYSICAL EXAMINATION: ECOG PERFORMANCE STATUS: 1 - Symptomatic but completely ambulatory Vitals:   08/15/18 1333  BP: 135/76  Pulse: 73  Temp: 98 F (36.7 C)   Filed Weights   08/15/18 1333  Weight: 149 lb 6 oz (67.8 kg)    Physical Exam Constitutional:      General: She is not in acute distress.    Appearance: She is not ill-appearing.  HENT:     Head: Normocephalic and atraumatic.  Eyes:     General: No scleral icterus.    Pupils: Pupils are equal, round, and reactive to light.  Neck:     Musculoskeletal: Normal range of motion and neck supple.  Cardiovascular:     Rate and Rhythm: Normal rate and regular rhythm.     Heart sounds: Normal  heart sounds.  Pulmonary:     Effort: Pulmonary effort is normal. No respiratory distress.     Breath sounds: No wheezing.  Abdominal:     General: Bowel sounds are normal. There is no distension.     Palpations: Abdomen is soft. There is no mass.     Tenderness: There is  no abdominal tenderness.  Musculoskeletal: Normal range of motion.        General: No deformity.  Skin:    General: Skin is warm and dry.     Coloration: Skin is not pale.     Findings: No erythema or rash.  Neurological:     Mental Status: She is alert and oriented to person, place, and time.     Cranial Nerves: No cranial nerve deficit.     Coordination: Coordination normal.  Psychiatric:        Behavior: Behavior normal.        Thought Content: Thought content normal.      LABORATORY DATA:  I have reviewed the data as listed Lab Results  Component Value Date   WBC 10.5 08/14/2018   HGB 12.7 08/14/2018   HCT 38.0 08/14/2018   MCV 83.9 08/14/2018   PLT 295 08/14/2018   Recent Labs    02/18/18 1032  02/22/18 0309 02/24/18 1126 06/08/18 2233  NA 132*   < > 137 138 137  K 2.9*   < > 3.8 4.0 3.6  CL 95*   < > 105 100 101  CO2 23   < > 24 23 25   GLUCOSE 255*   < > 153* 138* 239*  BUN 24*   < > 26* 14 23  CREATININE 1.42*   < > 1.16* 0.89 0.62  CALCIUM 8.2*   < > 8.2* 9.6 9.2  GFRNONAA 36*   < > 45* 63 >60  GFRAA 41*   < > 53* 72 >60  PROT 7.1  --   --   --  7.5  ALBUMIN 3.2*  --   --  3.4* 3.8  AST 28  --   --   --  17  ALT 11  --   --   --  10  ALKPHOS 99  --   --   --  76  BILITOT 0.9  --   --   --  0.4   < > = values in this interval not displayed.   Iron/TIBC/Ferritin/ %Sat    Component Value Date/Time   IRON 67 08/14/2018 1057   TIBC 358 08/14/2018 1057   FERRITIN 105 08/14/2018 1057   IRONPCTSAT 19 08/14/2018 1057     No results found.   ASSESSMENT & PLAN:  1. Iron deficiency anemia, unspecified iron deficiency anemia type    Labs reviewed and discussed with patient. Both  hemoglobin and iron panel have significantly improved. Hold additional IV iron at this point. Continue to monitor. Thrombocytosis has also normalized.  Secondary to iron deficiency.  GERD, continue PPI.  Continue follow-up with gastroenterology. .   Return in 6 months, for labs, MD assessment and possible additional Venofer.     Orders Placed This Encounter  Procedures  . CBC with Differential/Platelet    Standing Status:   Future    Standing Expiration Date:   08/17/2019  . Iron and TIBC    Standing Status:   Future    Standing Expiration Date:   08/17/2019  . Ferritin    Standing Status:   Future    Standing Expiration Date:   08/17/2019     We spent sufficient time to discuss many aspect of care, questions were answered to patient's satisfaction. Total face to face encounter time for this patient visit was 15 min. >50% of the time was  spent in counseling and coordination of care.   Earlie Server, MD, PhD Hematology  [place] [facility] [place]- [phone] 08/17/2018

## 2018-09-12 DIAGNOSIS — H353211 Exudative age-related macular degeneration, right eye, with active choroidal neovascularization: Secondary | ICD-10-CM | POA: Diagnosis not present

## 2018-09-12 DIAGNOSIS — H353221 Exudative age-related macular degeneration, left eye, with active choroidal neovascularization: Secondary | ICD-10-CM | POA: Diagnosis not present

## 2018-09-18 ENCOUNTER — Encounter: Payer: Self-pay | Admitting: Family Medicine

## 2018-09-18 ENCOUNTER — Ambulatory Visit (INDEPENDENT_AMBULATORY_CARE_PROVIDER_SITE_OTHER): Payer: PPO | Admitting: Family Medicine

## 2018-09-18 ENCOUNTER — Other Ambulatory Visit: Payer: Self-pay

## 2018-09-18 VITALS — Wt 140.0 lb

## 2018-09-18 DIAGNOSIS — R0781 Pleurodynia: Secondary | ICD-10-CM

## 2018-09-18 DIAGNOSIS — R200 Anesthesia of skin: Secondary | ICD-10-CM

## 2018-09-18 DIAGNOSIS — W19XXXA Unspecified fall, initial encounter: Secondary | ICD-10-CM

## 2018-09-18 DIAGNOSIS — R0789 Other chest pain: Secondary | ICD-10-CM

## 2018-09-18 DIAGNOSIS — R202 Paresthesia of skin: Secondary | ICD-10-CM | POA: Diagnosis not present

## 2018-09-18 NOTE — Progress Notes (Signed)
Patient: April Pittman Female    DOB: October 09, 1940   78 y.o.   MRN: [id number] Visit Date: 09/20/2018  Today's Provider: Lavon Paganini, MD   Chief Complaint  Patient presents with  . Fall   Subjective:    I,April Pittman,RMA am acting as a scribe for Lavon Paganini, MD  Virtual Visit via Telephone Note  I connected with April Pittman on 09/20/18 at  4:00 PM EDT by telephone and verified that I am speaking with the correct person using two identifiers.   Patient location: home Provider location: [place] office  Persons involved in the visit: patient, provider   I discussed the limitations, risks, security and privacy concerns of performing an evaluation and management service by telephone and the availability of in person appointments. I also discussed with the patient that there may be a patient responsible charge related to this service. The patient expressed understanding and agreed to proceed.   Fall The accident occurred 3 to 5 days ago. Fall occurred: at home in the tub. Reports that the rug slipped. She fell from a height of 3 to 5 ft. There was no blood loss. Point of impact: side of her right rib. Pain location: Right rib pain. The pain is moderate. The symptoms are aggravated by movement and pressure on injury. Pertinent negatives include no fever, numbness or tingling. She has tried acetaminophen for the symptoms.    She was standing up from sitting in the tub.  Her hand was on a washcloth on the edge of the tub and it slipped and she fell with her R sided ribs into the edge of the tub.  She has been wearing rib belt, but her friend told her to stop wearing it because she could get pneumonia.  4wks ago, fell out of bed and landed on left ribs.  Hurt a lot, but got better.  This seems to hurt worse.  She wakes at night in pain.  Still having some pain/numbness in L hand/wrist since that time. Seems ot happen abruptly.  No Known  Allergies   Current Outpatient Medications:  .  calcium carbonate (OS-CAL) 1250 (500 Ca) MG chewable tablet, Chew 1 tablet by mouth 2 (two) times daily., Disp: , Rfl:  .  clopidogrel (PLAVIX) 75 MG tablet, Take 75 mg by mouth daily., Disp: , Rfl:  .  estradiol (ESTRACE) 0.1 MG/GM vaginal cream, Place 1 Applicatorful vaginally once a week., Disp: , Rfl:  .  glucose blood (ONE TOUCH ULTRA TEST) test strip, USE TO CHECK BLOOD SUGAR DAILY. E 11.9 (TYPE 2 DIABETES MELLITIS), Disp: 100 each, Rfl: 4 .  levothyroxine (SYNTHROID, LEVOTHROID) 125 MCG tablet, TAKE 1 TABLET BY MOUTH IN THE MORNING ON AN EMPTY STOMACH FOR THYROID, Disp: 90 tablet, Rfl: 3 .  lisinopril-hydrochlorothiazide (PRINZIDE,ZESTORETIC) 10-12.5 MG tablet, Take 1 tablet by mouth daily., Disp: 90 tablet, Rfl: 3 .  metFORMIN (GLUCOPHAGE) 1000 MG tablet, Take 1 tablet (1,000 mg total) by mouth daily after supper. (Patient taking differently: Take 1,000 mg by mouth daily with breakfast. ), Disp: 3 tablet, Rfl: 3 .  metoprolol (LOPRESSOR) 50 MG tablet, Take 50 mg by mouth 2 (two) times daily. , Disp: , Rfl:  .  Multiple Vitamins-Minerals (PRESERVISION AREDS 2 PO), Take 2 capsules by mouth daily., Disp: , Rfl:  .  nitroGLYCERIN (NITROSTAT) 0.4 MG SL tablet, Place 1 tablet (0.4 mg total) under the tongue every 5 (five) minutes as needed for chest  pain., Disp: 30 tablet, Rfl: 0 .  pantoprazole (PROTONIX) 40 MG tablet, Take by mouth., Disp: , Rfl:  .  simvastatin (ZOCOR) 40 MG tablet, TAKE 1 TABLET BY MOUTH EVERY DAY FOR CHOLESTEROL, Disp: 30 tablet, Rfl: 12 .  vitamin B-12 (CYANOCOBALAMIN) 1000 MCG tablet, Take 1,000 mcg by mouth daily. , Disp: , Rfl:  .  ferrous sulfate 325 (65 FE) MG EC tablet, Take 1 tablet (325 mg total) by mouth 2 (two) times daily with a meal. (Patient not taking: Reported on 09/18/2018), Disp: 60 tablet, Rfl: 3  Review of Systems  Constitutional: Negative for fever.  Neurological: Negative for tingling and numbness.     Social History   Tobacco Use  . Smoking status: Former Smoker    Quit date: 03/03/1988    Years since quitting: 30.5  . Smokeless tobacco: Never Used  Substance Use Topics  . Alcohol use: No    Alcohol/week: 0.0 standard drinks      Objective:   Wt 140 lb (63.5 kg) Comment: per pt  BMI 24.03 kg/m  Vitals:   09/18/18 1404  Weight: 140 lb (63.5 kg)     Physical Exam Able to speak in full sentences and does not sound to be in distress  No results found for any visits on 09/18/18.     Assessment & Plan    I discussed the assessment and treatment plan with the patient. The patient was provided an opportunity to ask questions and all were answered. The patient agreed with the plan and demonstrated an understanding of the instructions.   The patient was advised to call back or seek an in-person evaluation if the symptoms worsen or if the condition fails to improve as anticipated.  1. Rib pain on right side 2. Fall, initial encounter 3. Numbness and tingling of left hand - patient with 2 falls recently - she had hard impact on R rib cage and continues to have pain at this site - concern for possible rib fractures - can continue tylenol - offered Tramadol but patient declined - advised against rib belt and discussed IS and deep breathing - fall ~4 wks ago with impact ot L hand and continued numbness in this hand - concern for possible fracture or other injury - will obtain R rib and L hand/wrist XRays - may need referral to Ortho for L hand - discussed fall precautions - DG Wrist Complete Left; Future - DG Hand Complete Left; Future - DG Ribs Unilateral Right; Future   Return if symptoms worsen or fail to improve.   The entirety of the information documented in the History of Present Illness, Review of Systems and Physical Exam were personally obtained by me. Portions of this information were initially documented by Lyndel Pleasure, CMA and reviewed by me for  thoroughness and accuracy.    Meta Kroenke, Dionne Bucy, MD MPH [facility]

## 2018-09-19 ENCOUNTER — Other Ambulatory Visit: Payer: Self-pay

## 2018-09-19 ENCOUNTER — Ambulatory Visit
Admission: RE | Admit: 2018-09-19 | Discharge: 2018-09-19 | Disposition: A | Discharge status: Home / Self Care | Payer: PPO | Source: Ambulatory Visit | Attending: Family Medicine | Admitting: Family Medicine

## 2018-09-19 DIAGNOSIS — R0781 Pleurodynia: Secondary | ICD-10-CM

## 2018-09-19 DIAGNOSIS — M19042 Primary osteoarthritis, left hand: Secondary | ICD-10-CM | POA: Diagnosis not present

## 2018-09-19 DIAGNOSIS — R202 Paresthesia of skin: Secondary | ICD-10-CM | POA: Insufficient documentation

## 2018-09-19 DIAGNOSIS — R200 Anesthesia of skin: Secondary | ICD-10-CM | POA: Insufficient documentation

## 2018-09-19 DIAGNOSIS — S6992XA Unspecified injury of left wrist, hand and finger(s), initial encounter: Secondary | ICD-10-CM | POA: Diagnosis not present

## 2018-09-19 DIAGNOSIS — M25532 Pain in left wrist: Secondary | ICD-10-CM | POA: Diagnosis not present

## 2018-09-19 DIAGNOSIS — W19XXXA Unspecified fall, initial encounter: Secondary | ICD-10-CM

## 2018-09-19 DIAGNOSIS — S2241XA Multiple fractures of ribs, right side, initial encounter for closed fracture: Secondary | ICD-10-CM | POA: Insufficient documentation

## 2018-09-21 ENCOUNTER — Telehealth: Payer: Self-pay

## 2018-09-21 ENCOUNTER — Telehealth: Payer: Self-pay | Admitting: Family Medicine

## 2018-09-21 DIAGNOSIS — S62345A Nondisplaced fracture of base of fourth metacarpal bone, left hand, initial encounter for closed fracture: Secondary | ICD-10-CM

## 2018-09-21 NOTE — Telephone Encounter (Signed)
Pt needing a call back for her xray results.  Please call her back at her daughter's [phone]  Thanks, [facility]

## 2018-09-21 NOTE — Telephone Encounter (Signed)
Patient notified of results. She would like to be referred to orthopedic.

## 2018-09-21 NOTE — Telephone Encounter (Signed)
LVM[facility] 

## 2018-09-21 NOTE — Telephone Encounter (Signed)
Referral placed.

## 2018-09-21 NOTE — Telephone Encounter (Signed)
-----   Message from April Crews, MD sent at 09/20/2018  2:46 PM EDT ----- L wrist appears normal, but does have possible new fracture of base of fourth metacarpal, one of the bones in the hand.  I'd like to refer her to Ortho if she agrees. See other Xray results also

## 2018-09-21 NOTE — Telephone Encounter (Signed)
-----   Message from Virginia Crews, MD sent at 09/20/2018  2:47 PM EDT ----- Patient has 4 ribs that are fractured on the R side from her fall.  Does appear to have some area I nthe bottom of the R lung that she is not using.  No rib belt and continue to work on deep breathing.  Tylenol is ok to take as we discussed

## 2018-09-22 DIAGNOSIS — S2241XA Multiple fractures of ribs, right side, initial encounter for closed fracture: Secondary | ICD-10-CM | POA: Diagnosis not present

## 2018-09-22 DIAGNOSIS — S62315A Displaced fracture of base of fourth metacarpal bone, left hand, initial encounter for closed fracture: Secondary | ICD-10-CM | POA: Diagnosis not present

## 2018-09-22 DIAGNOSIS — M79642 Pain in left hand: Secondary | ICD-10-CM | POA: Diagnosis not present

## 2018-09-22 NOTE — Telephone Encounter (Signed)
Patient was advised.  

## 2018-09-22 NOTE — Telephone Encounter (Signed)
Pt returned missed call.  Please call pt back at [phone].  Thanks, [employer]

## 2018-10-02 DIAGNOSIS — S62345A Nondisplaced fracture of base of fourth metacarpal bone, left hand, initial encounter for closed fracture: Secondary | ICD-10-CM | POA: Diagnosis not present

## 2018-10-02 DIAGNOSIS — S2241XA Multiple fractures of ribs, right side, initial encounter for closed fracture: Secondary | ICD-10-CM | POA: Diagnosis not present

## 2018-10-17 ENCOUNTER — Other Ambulatory Visit: Payer: Self-pay | Admitting: Oncology

## 2018-11-14 DIAGNOSIS — H353221 Exudative age-related macular degeneration, left eye, with active choroidal neovascularization: Secondary | ICD-10-CM | POA: Diagnosis not present

## 2018-11-14 DIAGNOSIS — H353211 Exudative age-related macular degeneration, right eye, with active choroidal neovascularization: Secondary | ICD-10-CM | POA: Diagnosis not present

## 2018-11-22 NOTE — Progress Notes (Signed)
Duplicate/error

## 2018-11-23 ENCOUNTER — Other Ambulatory Visit: Payer: Self-pay

## 2018-11-23 ENCOUNTER — Ambulatory Visit (INDEPENDENT_AMBULATORY_CARE_PROVIDER_SITE_OTHER): Payer: PPO

## 2018-11-23 DIAGNOSIS — E2839 Other primary ovarian failure: Secondary | ICD-10-CM | POA: Diagnosis not present

## 2018-11-23 DIAGNOSIS — Z0000 Encounter for general adult medical examination without abnormal findings: Secondary | ICD-10-CM

## 2018-11-23 DIAGNOSIS — Z9181 History of falling: Secondary | ICD-10-CM

## 2018-11-23 NOTE — Progress Notes (Signed)
Subjective:   April Pittman is a 78 y.o. female who presents for Medicare Annual (Subsequent) preventive examination.    This visit is being conducted through telemedicine due to the COVID-19 pandemic. This patient has given me verbal consent via doximity to conduct this visit, patient states they are participating from their home address. Some vital signs may be absent or patient reported.    Patient identification: identified by name, DOB, and current address  Review of Systems:  N/A  Cardiac Risk Factors include: advanced age (>25men, >1 women);diabetes mellitus;dyslipidemia;hypertension     Objective:     Vitals: There were no vitals taken for this visit.  There is no height or weight on file to calculate BMI. Unable to obtain vitals due to visit being conducted via telephonically.   Diabetes:  Is the patient diabetic?  Yes type 2 If diabetic, was a CBG obtained today?  No  Did the patient bring in their glucometer from home?  No  How often do you monitor your CBG's? Two to three times weekly..   Financial Strains and Diabetes Management:  Are you having any financial strains with the device, your supplies or your medication? No .  Does the patient want to be seen by Chronic Care Management for management of their diabetes?  No  Would the patient like to be referred to a Nutritionist or for Diabetic Management?  No   Diabetic Exams:  Diabetic Eye Exam: Completed 9//22/20 per pt. Requested records to be sent to office.   Diabetic Foot Exam: Completed 05/11/17. Pt has been advised about the importance in completing this exam. Note made to follow up on this at next in office visit.    Advanced Directives 06/08/2018 04/14/2018 03/03/2018 02/18/2018 11/18/2017 11/16/2016 10/27/2016  Does Patient Have a Medical Advance Directive? Yes No No No;Yes No No No  Type of Advance Directive - - - Living will - - -  Does patient want to make changes to medical advance directive? - - No  - Patient declined Yes (Inpatient - patient defers changing a medical advance directive at this time) - - -  Would patient like information on creating a medical advance directive? - - - No - Patient declined No - Patient declined - No - Patient declined    Tobacco Social History   Tobacco Use  Smoking Status Former Smoker  . Quit date: 03/03/1988  . Years since quitting: 30.7  Smokeless Tobacco Never Used     Counseling given: Not Answered   Clinical Intake:  Pre-visit preparation completed: Yes  Pain : No/denies pain Pain Score: 0-No pain     Nutritional Risks: None Diabetes: Yes  How often do you need to have someone help you when you read instructions, pamphlets, or other written materials from your doctor or pharmacy?: 3 - Sometimes  Interpreter Needed?: No  Information entered by :: [facility], LPN  Past Medical History:  Diagnosis Date  . Alopecia   . Anginal pain ([place])   . Benign neoplasm of large bowel   . CAD (coronary artery disease)   . Carpal tunnel syndrome   . Complication of anesthesia    vomits with any pain medicine  . DM (diabetes mellitus) ([place])   . Erosion of cervix   . GERD (gastroesophageal reflux disease)   . H/O adenomatous polyp of colon    with sever dysplasia, removed 2000 by colonoscopy  . History of chicken pox   . Hypercholesteremia   . Hypertension   .  Hypothyroid   . Irritable bowel   . Malignant melanoma of foot ([place])   . Osteoporosis   . PONV (postoperative nausea and vomiting)   . Post-menopausal bleeding   . Procidentia of uterus    with traction cystocele, managed with gellhorn pessary  . Rectal bleeding   . Sepsis ([place]) 02/18/2018  . TIA (transient ischemic attack)    Past Surgical History:  Procedure Laterality Date  . BREAST EXCISIONAL BIOPSY Right    Benign  . CARDIAC CATHETERIZATION  10/2010   +stenting to LAD and RCA; symptoms: chest pain, elevated Troponin, +AMI. [employer]  . CATARACT EXTRACTION  W/PHACO Right 07/28/2016   Procedure: CATARACT EXTRACTION PHACO AND INTRAOCULAR LENS PLACEMENT ([place])  Right Diabetic;  Surgeon: Leandrew Koyanagi, MD;  Location: [facility];  Service: Ophthalmology;  Laterality: Right;  Diabetic leave patient at 9 arrival  . CATARACT EXTRACTION W/PHACO Left 10/27/2016   Procedure: CATARACT EXTRACTION PHACO AND INTRAOCULAR LENS PLACEMENT ([place]) LEFT DIABETIC;  Surgeon: Leandrew Koyanagi, MD;  Location: [facility];  Service: Ophthalmology;  Laterality: Left;  . COLONOSCOPY  03/2008   2 polyps, internal and external hemorrhoids, +anal growth. Dr. Vira Agar  . COLONOSCOPY WITH PROPOFOL N/A 05/12/2018   Procedure: COLONOSCOPY WITH PROPOFOL;  Surgeon: Manya Silvas, MD;  Location: [facility] ENDOSCOPY;  Service: Endoscopy;  Laterality: N/A;  . ESOPHAGOGASTRODUODENOSCOPY (EGD) WITH PROPOFOL N/A 05/12/2018   Procedure: ESOPHAGOGASTRODUODENOSCOPY (EGD) WITH PROPOFOL;  Surgeon: Manya Silvas, MD;  Location[facility]COPY;  Service: Endoscopy;  Laterality: N/A;  . KNEE SURGERY    . TONSILLECTOMY     as a child   Family History  Problem Relation Age of Onset  . Lung cancer Brother   . Diabetes Father   . Heart disease Sister        Valvular rheumatic heart disease  . Rheumatic fever Sister   . Heart disease Paternal Grandmother   . Breast cancer Neg Hx   . Ovarian cancer Neg Hx   . Colon cancer Neg Hx    Social History   Socioeconomic History  . Marital status: Married    Spouse name: Not on file  . Number of children: 3  . Years of education: Not on file  . Highest education level: GED or equivalent  Occupational History  . Occupation: Retired    Comment: former Counselling psychologist  . Financial resource strain: Not hard at all  . Food insecurity    Worry: Never true    Inability: Never true  . Transportation needs    Medical: No    Non-medical: No  Tobacco Use  . Smoking status: Former Smoker    Quit date: 03/03/1988     Years since quitting: 30.7  . Smokeless tobacco: Never Used  Substance and Sexual Activity  . Alcohol use: No    Alcohol/week: 0.0 standard drinks  . Drug use: No  . Sexual activity: Not Currently  Lifestyle  . Physical activity    Days per week: 0 days    Minutes per session: 0 min  . Stress: To some extent  Relationships  . Social Herbalist on phone: Patient refused    Gets together: Patient refused    Attends religious service: Patient refused    Active member of club or organization: Patient refused    Attends meetings of clubs or organizations: Patient refused    Relationship status: Patient refused  Other Topics Concern  . Not on file  Social History Narrative  . Not on file    Outpatient Encounter Medications as of 11/23/2018  Medication Sig  . aspirin EC 81 MG tablet Take 81 mg by mouth daily.  . calcium carbonate (OS-CAL) 1250 (500 Ca) MG chewable tablet Chew 1 tablet by mouth 2 (two) times daily.  . clopidogrel (PLAVIX) 75 MG tablet Take 75 mg by mouth daily.  Marland Kitchen glucose blood (ONE TOUCH ULTRA TEST) test strip USE TO CHECK BLOOD SUGAR DAILY. E 11.9 (TYPE 2 DIABETES MELLITIS)  . levothyroxine (SYNTHROID, LEVOTHROID) 125 MCG tablet TAKE 1 TABLET BY MOUTH IN THE MORNING ON AN EMPTY STOMACH FOR THYROID  . lisinopril-hydrochlorothiazide (PRINZIDE,ZESTORETIC) 10-12.5 MG tablet Take 1 tablet by mouth daily.  . metFORMIN (GLUCOPHAGE) 1000 MG tablet Take 1 tablet (1,000 mg total) by mouth daily after supper. (Patient taking differently: Take 1,000 mg by mouth daily with breakfast. )  . metoprolol (LOPRESSOR) 50 MG tablet Take 50 mg by mouth 2 (two) times daily.   . Multiple Vitamins-Minerals (PRESERVISION AREDS 2 PO) Take 2 capsules by mouth daily.  . nitroGLYCERIN (NITROSTAT) 0.4 MG SL tablet Place 1 tablet (0.4 mg total) under the tongue every 5 (five) minutes as needed for chest pain.  . pantoprazole (PROTONIX) 40 MG tablet Take by mouth.  . simvastatin (ZOCOR) 40  MG tablet TAKE 1 TABLET BY MOUTH EVERY DAY FOR CHOLESTEROL  . vitamin B-12 (CYANOCOBALAMIN) 1000 MCG tablet Take 1,000 mcg by mouth daily.   Marland Kitchen estradiol (ESTRACE) 0.1 MG/GM vaginal cream Place 1 Applicatorful vaginally once a week.  . ferrous sulfate 325 (65 FE) MG EC tablet Take 1 tablet (325 mg total) by mouth 2 (two) times daily with a meal. (Patient not taking: Reported on 09/18/2018)   No facility-administered encounter medications on file as of 11/23/2018.     Activities of Daily Living In your present state of health, do you have any difficulty performing the following activities: 11/23/2018 02/18/2018  Hearing? N N  Vision? Y N  Comment Due to macular degeneration. -  Difficulty concentrating or making decisions? N N  Walking or climbing stairs? Y N  Comment Due to balance issues. -  Dressing or bathing? N N  Doing errands, shopping? N N  Preparing Food and eating ? N -  Using the Toilet? N -  In the past six months, have you accidently leaked urine? Y -  Comment Has a pessary in place. -  Do you have problems with loss of bowel control? N -  Managing your Medications? N -  Managing your Finances? N -  Housekeeping or managing your Housekeeping? N -  Some recent data might be hidden    Patient Care Team: Birdie Sons, MD as PCP - General (Family Medicine) Ubaldo Glassing Javier Docker, MD as Consulting Physician (Cardiology) Leandrew Koyanagi, MD as Referring Physician (Ophthalmology) Manya Silvas, MD (Gastroenterology)    Assessment:   This is a routine wellness examination for [place].  Exercise Activities and Dietary recommendations Current Exercise Habits: The patient does not participate in regular exercise at present, Exercise limited by: Other - see comments(Due to balance and mobility issues.)  Goals    . Increase water intake     Recommend increasing water intake to 6-8 glasses of water a day.     Marland Kitchen LIFESTYLE - DECREASE FALLS RISK     Recommend to remove any  items from the home that may cause slips or trips.       Fall Risk: Fall  Risk  11/23/2018 11/18/2017 01/11/2017 11/16/2016 12/31/2015  Falls in the past year? 1 No No Yes Yes  Number falls in past yr: 1 - - 1 1  Injury with Fall? 1 - - Yes -  Comment - - - broken knee cap -  Risk for fall due to : Impaired mobility;Impaired vision - - - -  Follow up Falls prevention discussed - - Falls prevention discussed Falls prevention discussed    FALL RISK PREVENTION PERTAINING TO THE HOME:  Any stairs in or around the home? Yes  If so, are there any without handrails? No   Home free of loose throw rugs in walkways, pet beds, electrical cords, etc? Yes  Adequate lighting in your home to reduce risk of falls? Yes   ASSISTIVE DEVICES UTILIZED TO PREVENT FALLS:  Life alert? No  Use of a cane, walker or w/c? Yes  Grab bars in the bathroom? Yes  Shower chair or bench in shower? No  Elevated toilet seat or a handicapped toilet? Yes    TIMED UP AND GO:  Was the test performed? No .    Depression Screen PHQ 2/9 Scores 11/23/2018 11/18/2017 11/16/2016 12/31/2015  PHQ - 2 Score 0 2 2 0  PHQ- 9 Score - 8 10 -     Cognitive Function: Declined today.      6CIT Screen 11/18/2017  What Year? 0 points  What month? 0 points  What time? 0 points  Count back from 20 0 points  Months in reverse 2 points  Repeat phrase 0 points  Total Score 2    Immunization History  Administered Date(s) Administered  . Influenza, High Dose Seasonal PF 01/29/2015, 12/31/2015, 01/11/2017, 12/15/2017  . Pneumococcal Conjugate-13 11/07/2013  . Pneumococcal Polysaccharide-23 12/07/2010  . Zoster 09/15/2015    Qualifies for Shingles Vaccine? Yes . Due for Shingrix. Education has been provided regarding the importance of this vaccine. Pt has been advised to call insurance company to determine out of pocket expense. Advised may also receive vaccine at local pharmacy or Health Dept. Verbalized acceptance and  understanding.  Tdap: Although this vaccine is not a covered service during a Wellness Exam, does the patient still wish to receive this vaccine today?  No .   Flu Vaccine: Due for Flu vaccine. Does the patient want to receive this vaccine today?  No .   Pneumococcal Vaccine: Completed series  Screening Tests Health Maintenance  Topic Date Due  . TETANUS/TDAP  12[birth year]06/1959  . FOOT EXAM  05/12/2018  . OPHTHALMOLOGY EXAM  07/20/2018  . INFLUENZA VACCINE  09/23/2018  . DEXA SCAN  11/18/2018  . HEMOGLOBIN A1C  12/21/2018  . COLONOSCOPY  05/12/2023  . PNA vac Low Risk Adult  Completed    Cancer Screenings:  Colorectal Screening: Completed 05/12/18. Repeat every 5 years.  Mammogram: Completed 02/02/16.   Bone Density: Completed 11/17/16. Results reflect OSTEOPOROSIS. Repeat every 2 years. Ordered today. Pt provided with contact info and advised to call to schedule appt. Pt aware the office will call re: appt.  Lung Cancer Screening: (Low Dose CT Chest recommended if Age 54-80 years, 30 pack-year currently smoking OR have quit w/in 15years.) does not qualify.   Additional Screening:  Vision Screening: Recommended annual ophthalmology exams for early detection of glaucoma and other disorders of the eye.  Dental Screening: Recommended annual dental exams for proper oral hygiene  Community Resource Referral:  CRR required this visit? Yes, referral sent to CCM for hx of falls.  Plan:  I have personally reviewed and addressed the Medicare Annual Wellness questionnaire and have noted the following in the patient's chart:  A. Medical and social history B. Use of alcohol, tobacco or illicit drugs  C. Current medications and supplements D. Functional ability and status E.  Nutritional status F.  Physical activity G. Advance directives H. List of other physicians I.  Hospitalizations, surgeries, and ER visits in previous 12 months J.  [place] such as hearing and  vision if needed, cognitive and depression L. Referrals and appointments   In addition, I have reviewed and discussed with patient certain preventive protocols, quality metrics, and best practice recommendations. A written personalized care plan for preventive services as well as general preventive health recommendations were provided to patient. Nurse Health Advisor  Signed,    Anely Spiewak [place], [place]  [id number] Nurse Health Advisor   Nurse Notes: Pt needs a diabetic foot exam and influenza vaccine at next in office visit. Requested records be sent to the clinic for previous eye exam.

## 2018-11-23 NOTE — Patient Instructions (Signed)
April Pittman , Thank you for taking time to come for your Medicare Wellness Visit. I appreciate your ongoing commitment to your health goals. Please review the following plan we discussed and let me know if I can assist you in the future.   Screening recommendations/referrals: Colonoscopy: Up to date, due 04/2023 Mammogram: No longer required.  Bone Density: Ordered today. Pt aware office will contact her to set up apt.  Recommended yearly ophthalmology/optometry visit for glaucoma screening and checkup Recommended yearly dental visit for hygiene and checkup  Vaccinations: Influenza vaccine: Currently due Pneumococcal vaccine: Completed series Tdap vaccine: Pt declines today.  Shingles vaccine: Pt declines today.     Advanced directives: Please bring a copy of your POA (Power of Attorney) and/or Living Will to your next appointment.   Conditions/risks identified: Fall risk prevenetion discussed. Continue to increase water intake to 6-8 8 oz glasses daily.   Next appointment: 11/27/18 @ 10:00 AM with Dr Caryn Section.    Preventive Care 78 Years and Older, Female Preventive care refers to lifestyle choices and visits with your health care provider that can promote health and wellness. What does preventive care include?  A yearly physical exam. This is also called an annual well check.  Dental exams once or twice a year.  Routine eye exams. Ask your health care provider how often you should have your eyes checked.  Personal lifestyle choices, including:  Daily care of your teeth and gums.  Regular physical activity.  Eating a healthy diet.  Avoiding tobacco and drug use.  Limiting alcohol use.  Practicing safe sex.  Taking low-dose aspirin every day.  Taking vitamin and mineral supplements as recommended by your health care provider. What happens during an annual well check? The services and screenings done by your health care provider during your annual well check will depend  on your age, overall health, lifestyle risk factors, and family history of disease. Counseling  Your health care provider may ask you questions about your:  Alcohol use.  Tobacco use.  Drug use.  Emotional well-being.  Home and relationship well-being.  Sexual activity.  Eating habits.  History of falls.  Memory and ability to understand (cognition).  Work and work Statistician.  Reproductive health. Screening  You may have the following tests or measurements:  Height, weight, and BMI.  Blood pressure.  Lipid and cholesterol levels. These may be checked every 5 years, or more frequently if you are over 30 years old.  Skin check.  Lung cancer screening. You may have this screening every year starting at age 60 if you have a 30-pack-year history of smoking and currently smoke or have quit within the past 15 years.  Fecal occult blood test (FOBT) of the stool. You may have this test every year starting at age 44.  Flexible sigmoidoscopy or colonoscopy. You may have a sigmoidoscopy every 5 years or a colonoscopy every 10 years starting at age 16.  Hepatitis C blood test.  Hepatitis B blood test.  Sexually transmitted disease (STD) testing.  Diabetes screening. This is done by checking your blood sugar (glucose) after you have not eaten for a while (fasting). You may have this done every 1-3 years.  Bone density scan. This is done to screen for osteoporosis. You may have this done starting at age 98.  Mammogram. This may be done every 1-2 years. Talk to your health care provider about how often you should have regular mammograms. Talk with your health care provider about your test  results, treatment options, and if necessary, the need for more tests. Vaccines  Your health care provider may recommend certain vaccines, such as:  Influenza vaccine. This is recommended every year.  Tetanus, diphtheria, and acellular pertussis (Tdap, Td) vaccine. You may need a Td  booster every 10 years.  Zoster vaccine. You may need this after age 72.  Pneumococcal 13-valent conjugate (PCV13) vaccine. One dose is recommended after age 61.  Pneumococcal polysaccharide (PPSV23) vaccine. One dose is recommended after age 22. Talk to your health care provider about which screenings and vaccines you need and how often you need them. This information is not intended to replace advice given to you by your health care provider. Make sure you discuss any questions you have with your health care provider. Document Released: 03/07/2015 Document Revised: 10/29/2015 Document Reviewed: 12/10/2014 Elsevier Interactive Patient Education  2017 [place] Prevention in the Home Falls can cause injuries. They can happen to people of all ages. There are many things you can do to make your home safe and to help prevent falls. What can I do on the outside of my home?  Regularly fix the edges of walkways and driveways and fix any cracks.  Remove anything that might make you trip as you walk through a door, such as a raised step or threshold.  Trim any bushes or trees on the path to your home.  Use bright outdoor lighting.  Clear any walking paths of anything that might make someone trip, such as rocks or tools.  Regularly check to see if handrails are loose or broken. Make sure that both sides of any steps have handrails.  Any raised decks and porches should have guardrails on the edges.  Have any leaves, snow, or ice cleared regularly.  Use sand or salt on walking paths during winter.  Clean up any spills in your garage right away. This includes oil or grease spills. What can I do in the bathroom?  Use night lights.  Install grab bars by the toilet and in the tub and shower. Do not use towel bars as grab bars.  Use non-skid mats or decals in the tub or shower.  If you need to sit down in the shower, use a plastic, non-slip stool.  Keep the floor dry. Clean up  any water that spills on the floor as soon as it happens.  Remove soap buildup in the tub or shower regularly.  Attach bath mats securely with double-sided non-slip rug tape.  Do not have throw rugs and other things on the floor that can make you trip. What can I do in the bedroom?  Use night lights.  Make sure that you have a light by your bed that is easy to reach.  Do not use any sheets or blankets that are too big for your bed. They should not hang down onto the floor.  Have a firm chair that has side arms. You can use this for support while you get dressed.  Do not have throw rugs and other things on the floor that can make you trip. What can I do in the kitchen?  Clean up any spills right away.  Avoid walking on wet floors.  Keep items that you use a lot in easy-to-reach places.  If you need to reach something above you, use a strong step stool that has a grab bar.  Keep electrical cords out of the way.  Do not use floor polish or wax that makes  floors slippery. If you must use wax, use non-skid floor wax.  Do not have throw rugs and other things on the floor that can make you trip. What can I do with my stairs?  Do not leave any items on the stairs.  Make sure that there are handrails on both sides of the stairs and use them. Fix handrails that are broken or loose. Make sure that handrails are as long as the stairways.  Check any carpeting to make sure that it is firmly attached to the stairs. Fix any carpet that is loose or worn.  Avoid having throw rugs at the top or bottom of the stairs. If you do have throw rugs, attach them to the floor with carpet tape.  Make sure that you have a light switch at the top of the stairs and the bottom of the stairs. If you do not have them, ask someone to add them for you. What else can I do to help prevent falls?  Wear shoes that:  Do not have high heels.  Have rubber bottoms.  Are comfortable and fit you well.  Are  closed at the toe. Do not wear sandals.  If you use a stepladder:  Make sure that it is fully opened. Do not climb a closed stepladder.  Make sure that both sides of the stepladder are locked into place.  Ask someone to hold it for you, if possible.  Clearly mark and make sure that you can see:  Any grab bars or handrails.  First and last steps.  Where the edge of each step is.  Use tools that help you move around (mobility aids) if they are needed. These include:  Canes.  Walkers.  Scooters.  Crutches.  Turn on the lights when you go into a dark area. Replace any light bulbs as soon as they burn out.  Set up your furniture so you have a clear path. Avoid moving your furniture around.  If any of your floors are uneven, fix them.  If there are any pets around you, be aware of where they are.  Review your medicines with your doctor. Some medicines can make you feel dizzy. This can increase your chance of falling. Ask your doctor what other things that you can do to help prevent falls. This information is not intended to replace advice given to you by your health care provider. Make sure you discuss any questions you have with your health care provider. Document Released: 12/05/2008 Document Revised: 07/17/2015 Document Reviewed: 03/15/2014 Elsevier Interactive Patient Education  2017 [employer].

## 2018-11-24 ENCOUNTER — Telehealth: Payer: Self-pay | Admitting: Family Medicine

## 2018-11-24 NOTE — Chronic Care Management (AMB) (Signed)
Chronic Care Management   Note  11/24/2018 Name: April Pittman MRN: [id number] DOB: 14-May-1940  April Pittman is a 78 y.o. year old female who is a primary care patient of Caryn Section, Kirstie Peri, MD. I reached out to [employer] by phone today in response to a referral sent by Ms. April Lore Dever's PCP, Dr. Caryn Section      [name] was given information about Chronic Care Management services today including:  1. CCM service includes personalized support from designated clinical staff supervised by her physician, including individualized plan of care and coordination with other care providers 2. 24/7 contact phone numbers for assistance for urgent and routine care needs. 3. Service will only be billed when office clinical staff spend 20 minutes or more in a month to coordinate care. 4. Only one practitioner may furnish and bill the service in a calendar month. 5. The patient may stop CCM services at any time (effective at the end of the month) by phone call to the office staff. 6. The patient will be responsible for cost sharing (co-pay) of up to 20% of the service fee (after annual deductible is met).  Patient agreed to services and verbal consent obtained.   Follow up plan: Telephone appointment with CCM team member scheduled for: 12/07/2018  [place]  ??[email or web address]   ??[phone]

## 2018-11-27 ENCOUNTER — Ambulatory Visit (INDEPENDENT_AMBULATORY_CARE_PROVIDER_SITE_OTHER): Payer: PPO | Admitting: Family Medicine

## 2018-11-27 ENCOUNTER — Telehealth: Payer: Self-pay

## 2018-11-27 ENCOUNTER — Encounter: Payer: Self-pay | Admitting: Family Medicine

## 2018-11-27 ENCOUNTER — Other Ambulatory Visit: Payer: Self-pay

## 2018-11-27 VITALS — BP 130/70 | HR 74 | Temp 96.9°F | Resp 15 | Wt 151.2 lb

## 2018-11-27 DIAGNOSIS — M79643 Pain in unspecified hand: Secondary | ICD-10-CM

## 2018-11-27 DIAGNOSIS — N814 Uterovaginal prolapse, unspecified: Secondary | ICD-10-CM

## 2018-11-27 DIAGNOSIS — Z23 Encounter for immunization: Secondary | ICD-10-CM

## 2018-11-27 DIAGNOSIS — R300 Dysuria: Secondary | ICD-10-CM

## 2018-11-27 LAB — POCT URINALYSIS DIPSTICK
Bilirubin, UA: NEGATIVE
Blood, UA: NEGATIVE
Glucose, UA: NEGATIVE
Ketones, UA: NEGATIVE
Leukocytes, UA: NEGATIVE
Nitrite, UA: NEGATIVE
Protein, UA: POSITIVE — AB
Spec Grav, UA: 1.01 (ref 1.010–1.025)
Urobilinogen, UA: 0.2 E.U./dL
pH, UA: 5 (ref 5.0–8.0)

## 2018-11-27 MED ORDER — TRIAMCINOLONE ACETONIDE 0.1 % EX CREA: 1.0000 "application " | TOPICAL_CREAM | Freq: Two times a day (BID) | CUTANEOUS | 0 refills | Status: DC

## 2018-11-27 NOTE — Progress Notes (Signed)
Patient: April Pittman Female    DOB: 1941/01/02   78 y.o.   MRN: [id number] Visit Date: 11/27/2018  Today's Provider: [name], MD   Chief Complaint  Patient presents with  . Dysuria   Subjective:     Dysuria  This is a new problem. The problem has been unchanged. The pain is mild. There has been no fever. Pertinent negatives include no chills, discharge, flank pain, frequency, hematuria, hesitancy, nausea, possible pregnancy, sweats, urgency or vomiting. She has tried nothing for the symptoms.  She has long history of uterine prolapse with pessary previously follow by Dr. Quenten Raven.    No Known Allergies   Current Outpatient Medications:  .  aspirin EC 81 MG tablet, Take 81 mg by mouth daily., Disp: , Rfl:  .  calcium carbonate (OS-CAL) 1250 (500 Ca) MG chewable tablet, Chew 1 tablet by mouth 2 (two) times daily., Disp: , Rfl:  .  clopidogrel (PLAVIX) 75 MG tablet, Take 75 mg by mouth daily., Disp: , Rfl:  .  estradiol (ESTRACE) 0.1 MG/GM vaginal cream, Place 1 Applicatorful vaginally once a week., Disp: , Rfl:  .  ferrous sulfate 325 (65 FE) MG EC tablet, Take 1 tablet (325 mg total) by mouth 2 (two) times daily with a meal. (Patient not taking: Reported on 09/18/2018), Disp: 60 tablet, Rfl: 3 .  glucose blood (ONE TOUCH ULTRA TEST) test strip, USE TO CHECK BLOOD SUGAR DAILY. E 11.9 (TYPE 2 DIABETES MELLITIS), Disp: 100 each, Rfl: 4 .  levothyroxine (SYNTHROID, LEVOTHROID) 125 MCG tablet, TAKE 1 TABLET BY MOUTH IN THE MORNING ON AN EMPTY STOMACH FOR THYROID, Disp: 90 tablet, Rfl: 3 .  lisinopril-hydrochlorothiazide (PRINZIDE,ZESTORETIC) 10-12.5 MG tablet, Take 1 tablet by mouth daily., Disp: 90 tablet, Rfl: 3 .  metFORMIN (GLUCOPHAGE) 1000 MG tablet, Take 1 tablet (1,000 mg total) by mouth daily after supper. (Patient taking differently: Take 1,000 mg by mouth daily with breakfast. ), Disp: 3 tablet, Rfl: 3 .  metoprolol (LOPRESSOR) 50 MG tablet, Take 50 mg by mouth  2 (two) times daily. , Disp: , Rfl:  .  Multiple Vitamins-Minerals (PRESERVISION AREDS 2 PO), Take 2 capsules by mouth daily., Disp: , Rfl:  .  nitroGLYCERIN (NITROSTAT) 0.4 MG SL tablet, Place 1 tablet (0.4 mg total) under the tongue every 5 (five) minutes as needed for chest pain., Disp: 30 tablet, Rfl: 0 .  pantoprazole (PROTONIX) 40 MG tablet, Take by mouth., Disp: , Rfl:  .  simvastatin (ZOCOR) 40 MG tablet, TAKE 1 TABLET BY MOUTH EVERY DAY FOR CHOLESTEROL, Disp: 30 tablet, Rfl: 12 .  vitamin B-12 (CYANOCOBALAMIN) 1000 MCG tablet, Take 1,000 mcg by mouth daily. , Disp: , Rfl:   Review of Systems  Constitutional: Negative for chills.  Gastrointestinal: Negative for nausea and vomiting.  Genitourinary: Positive for dysuria. Negative for flank pain, frequency, hematuria, hesitancy and urgency.    Social History   Tobacco Use  . Smoking status: Former Smoker    Quit date: 03/03/1988    Years since quitting: 30.7  . Smokeless tobacco: Never Used  Substance Use Topics  . Alcohol use: No    Alcohol/week: 0.0 standard drinks      Objective:   BP 130/70   Pulse 74   Temp (!) 96.9 F (36.1 C) (Oral)   Resp 15   Wt 151 lb 3.2 oz (68.6 kg)   BMI 25.95 kg/m  Vitals:   11/27/18 1015  BP: 130/70  Pulse:  74  Resp: 15  Temp: (!) 96.9 F (36.1 C)  TempSrc: Oral  Weight: 151 lb 3.2 oz (68.6 kg)  Body mass index is 25.95 kg/m.   Physical Exam  General appearance: Well developed, well nourished female, cooperative and in no acute distress Head: Normocephalic, without obvious abnormality, atraumatic Respiratory: Respirations even and unlabored, normal respiratory rate Extremities: All extremities are intact.  Skin: Skin color, texture, turgor normal. No rashes seen  Psych: Appropriate mood and affect. Neurologic: Mental status: Alert, oriented to person, place, and time, thought content appropriate.  Results for orders placed or performed in visit on 11/27/18  POCT urinalysis  dipstick  Result Value Ref Range   Color, UA yellow    Clarity, UA cloudy    Glucose, UA Negative Negative   Bilirubin, UA negative    Ketones, UA negative    Spec Grav, UA 1.010 1.010 - 1.025   Blood, UA negative    pH, UA 5.0 5.0 - 8.0   Protein, UA Positive (A) Negative   Urobilinogen, UA 0.2 0.2 or 1.0 E.U./dL   Nitrite, UA negative    Leukocytes, UA Negative Negative   Appearance     Odor         Assessment & Plan    1. Dysuria  - Ambulatory referral to Gynecology  2. Uterine prolapse Has pessary, but not had follow up with gyn for several years, previously seen Dr. Keturah Barre. She would like to establish with another gyn.  - Ambulatory referral to Gynecology  3. Need for influenza vaccination  - Flu Vaccine QUAD High Dose(Fluad)  The entirety of the information documented in the History of Present Illness, Review of Systems and Physical Exam were personally obtained by me. Portions of this information were initially documented by Minette Headland, CMA and reviewed by me for thoroughness and accuracy.      [name], MD  [facility]

## 2018-11-27 NOTE — Patient Instructions (Signed)
.   Please review the attached list of medications and notify my office if there are any errors.   . Please bring all of your medications to every appointment so we can make sure that our medication list is the same as yours.   . It is especially important to get the annual flu vaccine this year. If you haven't had it already, please go to your pharmacy or call the office as soon as possible to schedule you flu shot.  

## 2018-11-27 NOTE — Telephone Encounter (Signed)
Patient was seen in office earlier today and states that she forgot to ask you about referral for specialist for her hand. Patient states that she had a previous fall before and after having x-ray we referred her to doctor for carpal tunnel. Patient reports that she does not have carpal tunnel therefore she never saw specialist. Patient states that she has numbness in her left thumb, pointer and middle finger that has been intermittent for over 2 months. Patient is wanting to know if we an refer her to see a specialist for numbness in her hand? Kw

## 2018-11-30 DIAGNOSIS — I10 Essential (primary) hypertension: Secondary | ICD-10-CM | POA: Diagnosis not present

## 2018-11-30 DIAGNOSIS — E7800 Pure hypercholesterolemia, unspecified: Secondary | ICD-10-CM | POA: Diagnosis not present

## 2018-11-30 DIAGNOSIS — E119 Type 2 diabetes mellitus without complications: Secondary | ICD-10-CM | POA: Diagnosis not present

## 2018-11-30 DIAGNOSIS — I2510 Atherosclerotic heart disease of native coronary artery without angina pectoris: Secondary | ICD-10-CM | POA: Diagnosis not present

## 2018-12-05 ENCOUNTER — Encounter: Payer: Self-pay | Admitting: Obstetrics & Gynecology

## 2018-12-05 ENCOUNTER — Ambulatory Visit: Payer: PPO | Admitting: Obstetrics & Gynecology

## 2018-12-05 ENCOUNTER — Other Ambulatory Visit: Payer: Self-pay

## 2018-12-05 VITALS — BP 120/80 | Ht 63.0 in | Wt 152.0 lb

## 2018-12-05 DIAGNOSIS — N814 Uterovaginal prolapse, unspecified: Secondary | ICD-10-CM

## 2018-12-05 DIAGNOSIS — N813 Complete uterovaginal prolapse: Secondary | ICD-10-CM

## 2018-12-05 MED ORDER — TRIMO-SAN 0.025 % VA GEL: 1.0000 | VAGINAL | 6 refills | Status: DC

## 2018-12-05 NOTE — Progress Notes (Signed)
HPI:      [name] is a 78 y.o. [id number] who presents today for her pessary examination related to her pelvic floor weakening.  She has seen Dr Enzo Bi in past but not for one year since his retirement.  She has used a gellhorn type pessary and cleans it by removing for a few days at a time evry so often.   Pt reports tolerating the pessary well with no vaginal bleeding and  no vaginal discharge.  Symptoms of pelvic floor weakening have greatly improved. She is voiding and defecating without difficulty. She currently has a #3 or 4 Gellhorn pessary.  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 Also,  has a past surgical history that includes Tonsillectomy; Knee surgery; Cardiac catheterization (10/2010); Colonoscopy (03/2008); Breast excisional biopsy (Right); Cataract extraction w/PHACO (Right, 07/28/2016); Cataract extraction w/PHACO (Left, 10/27/2016); Esophagogastroduodenoscopy (egd) with propofol (N/A, 05/12/2018); and Colonoscopy with propofol (N/A, 05/12/2018)., family history includes Diabetes in her father; Heart disease in her paternal grandmother and sister; Lung cancer in her brother; Rheumatic fever in her sister.,  reports that she quit smoking about 30 years ago. She has never used smokeless tobacco. She reports that she does not drink alcohol or use drugs.  She has a current medication list which includes the following  prescription(s): aspirin ec, calcium carbonate, clopidogrel, estradiol, ferrous sulfate, glucose blood, levothyroxine, lisinopril-hydrochlorothiazide, metformin, metoprolol tartrate, multiple vitamins-minerals, nitroglycerin, simvastatin, vitamin b-12, trimo-san, pantoprazole, and triamcinolone cream. Also, has No Known Allergies.  Review of Systems  Constitutional: Negative for chills, fever and malaise/fatigue.  HENT: Negative for congestion, sinus pain and sore throat.   Eyes: Negative for blurred vision and pain.  Respiratory: Negative for cough and wheezing.   Cardiovascular: Negative for chest pain and leg swelling.  Gastrointestinal: Negative for abdominal pain, constipation, diarrhea, heartburn, nausea and vomiting.  Genitourinary: Negative for dysuria, frequency, hematuria and urgency.  Musculoskeletal: Negative for back pain, joint pain, myalgias and neck pain.  Skin: Negative for itching and rash.  Neurological: Negative for dizziness, tremors and weakness.  Endo/Heme/Allergies: Does not bruise/bleed easily.  Psychiatric/Behavioral: Negative for depression. The patient is not nervous/anxious and does not have insomnia.     Objective: BP 120/80   Ht 5\' 3"  (1.6 m)   Wt 152 lb (68.9 kg)   BMI 26.93 kg/m  Physical Exam Constitutional:      General: She is not in acute distress.    Appearance: She is well-developed.  Genitourinary:     Pelvic exam was performed with patient supine.     Vagina and uterus normal.     No vaginal erythema or bleeding.     No cervical motion tenderness, discharge, polyp or nabothian cyst.     Uterus is mobile.     Uterus is not enlarged.     No uterine mass detected.    Uterus is midaxial.     No right or left adnexal mass present.     Right adnexa not tender.     Left adnexa not tender.     Genitourinary Comments: Gr  3 prolapse of uterus, no lesion on cervix, Gr 2 cystocele  HENT:     Head: Normocephalic and atraumatic.     Nose: Nose  normal.  Abdominal:     General: There is no distension.     Palpations: Abdomen is soft.     Tenderness: There is no abdominal tenderness.  Musculoskeletal: Normal range of motion.  Neurological:     Mental Status: She is alert and oriented to person, place, and time.     Cranial Nerves: No cranial nerve deficit.  Skin:    General: Skin is warm and dry.  Psychiatric:        Attention and Perception: Attention normal.        Mood and Affect: Mood and affect normal.        Speech: Speech normal.        Behavior: Behavior normal.        Thought Content: Thought content normal.        Judgment: Judgment normal.     Pessary Care Pessary removed and cleaned.  Vagina checked - without erosions - pessary replaced.  A/P:   ICD-10-CM   1. Procidentia of uterus  N81.3   2. Cystocele with prolapse  N81.4    Pessary was cleaned and replaced today. Instructions given for care. Concerning symptoms to observe for are counseled to patient. Follow up scheduled for 6 months, as she plans to continue to remove, clean, and replace herself monthly  A total of 15 minutes were spent face-to-face with the patient during this encounter and over half of that time dealt with counseling and coordination of care.  Barnett Applebaum, MD, [facility], [facility] 12/05/2018  3:35 PM

## 2018-12-07 ENCOUNTER — Telehealth: Payer: PPO

## 2018-12-12 ENCOUNTER — Telehealth: Payer: Self-pay

## 2018-12-14 ENCOUNTER — Ambulatory Visit
Admission: RE | Admit: 2018-12-14 | Discharge: 2018-12-14 | Disposition: A | Discharge status: Home / Self Care | Payer: PPO | Source: Ambulatory Visit | Attending: Family Medicine | Admitting: Family Medicine

## 2018-12-14 ENCOUNTER — Other Ambulatory Visit: Payer: Self-pay

## 2018-12-14 DIAGNOSIS — E2839 Other primary ovarian failure: Secondary | ICD-10-CM | POA: Diagnosis not present

## 2018-12-14 DIAGNOSIS — M810 Age-related osteoporosis without current pathological fracture: Secondary | ICD-10-CM | POA: Diagnosis not present

## 2018-12-14 DIAGNOSIS — Z780 Asymptomatic menopausal state: Secondary | ICD-10-CM | POA: Diagnosis not present

## 2018-12-14 DIAGNOSIS — Z20822 Contact with and (suspected) exposure to covid-19: Secondary | ICD-10-CM

## 2018-12-14 DIAGNOSIS — Z20828 Contact with and (suspected) exposure to other viral communicable diseases: Secondary | ICD-10-CM | POA: Diagnosis not present

## 2018-12-16 LAB — NOVEL CORONAVIRUS, NAA: SARS-CoV-2, NAA: NOT DETECTED

## 2018-12-18 ENCOUNTER — Telehealth: Payer: Self-pay

## 2018-12-18 ENCOUNTER — Other Ambulatory Visit: Payer: Self-pay

## 2018-12-18 DIAGNOSIS — Z20828 Contact with and (suspected) exposure to other viral communicable diseases: Secondary | ICD-10-CM | POA: Diagnosis not present

## 2018-12-18 DIAGNOSIS — Z20822 Contact with and (suspected) exposure to covid-19: Secondary | ICD-10-CM

## 2018-12-18 NOTE — Telephone Encounter (Signed)
-----   Message from Birdie Sons, MD sent at 12/15/2018  4:29 PM EDT ----- Bone density shows osteoporosis. She either needs to start back on alendronate, or refer to endocrinology for treatment with prolia injections. I think she stopped alendronate due to reflux.

## 2018-12-18 NOTE — Telephone Encounter (Signed)
Patient advised. She declined either treatment plan at this time. She states she just stopped taking fosamax a while ago, but not related to reflux.

## 2018-12-20 LAB — NOVEL CORONAVIRUS, NAA: SARS-CoV-2, NAA: NOT DETECTED

## 2018-12-26 ENCOUNTER — Ambulatory Visit: Payer: Self-pay

## 2018-12-26 ENCOUNTER — Telehealth: Payer: Self-pay

## 2018-12-26 NOTE — Chronic Care Management (AMB) (Signed)
  Chronic Care Management   Outreach Note  12/26/2018 Name: April Pittman MRN: [id number] DOB: April 08, 1940   A second unsuccessful telephone outreach was attempted today. [name] was referred to the case management team for assistance with care management and care coordination.   Follow Up Plan:  The care management team will reach out to the patient again within two weeks.   Horris Latino [facility] Care Management [phone]

## 2018-12-26 NOTE — Progress Notes (Signed)
Patient return call. Reports that she couldn't get on time to the phone.

## 2019-01-03 ENCOUNTER — Ambulatory Visit: Payer: Self-pay | Admitting: Family Medicine

## 2019-01-05 ENCOUNTER — Other Ambulatory Visit: Payer: Self-pay

## 2019-01-05 ENCOUNTER — Ambulatory Visit (INDEPENDENT_AMBULATORY_CARE_PROVIDER_SITE_OTHER): Payer: PPO | Admitting: Family Medicine

## 2019-01-05 ENCOUNTER — Encounter: Payer: Self-pay | Admitting: Family Medicine

## 2019-01-05 VITALS — BP 138/70 | HR 67 | Temp 97.1°F | Resp 16 | Wt 151.0 lb

## 2019-01-05 DIAGNOSIS — E119 Type 2 diabetes mellitus without complications: Secondary | ICD-10-CM | POA: Diagnosis not present

## 2019-01-05 DIAGNOSIS — I700 Atherosclerosis of aorta: Secondary | ICD-10-CM

## 2019-01-05 DIAGNOSIS — E1165 Type 2 diabetes mellitus with hyperglycemia: Secondary | ICD-10-CM | POA: Diagnosis not present

## 2019-01-05 DIAGNOSIS — E039 Hypothyroidism, unspecified: Secondary | ICD-10-CM

## 2019-01-05 LAB — POCT GLYCOSYLATED HEMOGLOBIN (HGB A1C)
Est. average glucose Bld gHb Est-mCnc: 212
Hemoglobin A1C: 9 % — AB (ref 4.0–5.6)

## 2019-01-05 MED ORDER — METFORMIN HCL 1000 MG PO TABS: 1000.0000 mg | ORAL_TABLET | Freq: Two times a day (BID) | ORAL | 3 refills | Status: DC

## 2019-01-05 MED ORDER — LEVOTHYROXINE SODIUM 125 MCG PO TABS: ORAL_TABLET | ORAL | 3 refills | Status: DC

## 2019-01-05 NOTE — Progress Notes (Signed)
Patient: April Pittman Female    DOB: 1940/04/07   78 y.o.   MRN: [id number] Visit Date: 01/05/2019  Today's Provider: [name], MD   Chief Complaint  Patient presents with  . Diabetes  . Hyperlipidemia   Subjective:     HPI  Diabetes Mellitus Type II, Follow-up:   Lab Results  Component Value Date   HGBA1C 8.1 (H) 06/21/2018   HGBA1C 7.6 (H) 02/18/2018   HGBA1C 7.5 (A) 12/15/2017    Last seen for diabetes 7 months ago.  Management since then includes no changes; patient was advised to cut back on sweets and starchy foods. She reports good compliance with treatment. She is not having side effects.   She was previously on BID metformin which was changed to one tablet daily in January due to chronic nausea.  Current symptoms include none and have been stable. Home blood sugar records: patient states her blood sugars have been high lately  Episodes of hypoglycemia? no   Current insulin regiment: Is not on insulin Most Recent Eye Exam: 11/14/2018. Sees Dr. Wallace Going Weight trend: stable Prior visit with dietician: No Current exercise: none Current diet habits: in general, an "unhealthy" diet  Pertinent Labs:    Component Value Date/Time   CHOL 202 (H) 06/21/2018 1102   TRIG 276 (H) 06/21/2018 1102   HDL 57 06/21/2018 1102   LDLCALC 90 06/21/2018 1102   CREATININE 0.62 06/08/2018 2233    Wt Readings from Last 3 Encounters:  01/05/19 151 lb (68.5 kg)  12/05/18 152 lb (68.9 kg)  11/27/18 151 lb 3.2 oz (68.6 kg)    ------------------------------------------------------------------------  Lipid/Cholesterol, Follow-up:   Last seen for this 7 months ago.  Management changes since that visit include none. . Last Lipid Panel:    Component Value Date/Time   CHOL 202 (H) 06/21/2018 1102   TRIG 276 (H) 06/21/2018 1102   HDL 57 06/21/2018 1102   CHOLHDL 3.5 06/21/2018 1102   [place] 90 06/21/2018 1102   LDLDIRECT 73 05/11/2017 1145    Risk  factors for vascular disease include diabetes mellitus and hypercholesterolemia  She reports good compliance with treatment. She is not having side effects.  Current symptoms include none and have been stable. Weight trend: stable Prior visit with dietician: no Current diet: in general, an "unhealthy" diet Current exercise: none  Wt Readings from Last 3 Encounters:  01/05/19 151 lb (68.5 kg)  12/05/18 152 lb (68.9 kg)  11/27/18 151 lb 3.2 oz (68.6 kg)    -------------------------------------------------------------------  No Known Allergies   Current Outpatient Medications:  .  aspirin EC 81 MG tablet, Take 81 mg by mouth daily., Disp: , Rfl:  .  calcium carbonate (OS-CAL) 1250 (500 Ca) MG chewable tablet, Chew 1 tablet by mouth 2 (two) times daily., Disp: , Rfl:  .  clopidogrel (PLAVIX) 75 MG tablet, Take 75 mg by mouth daily., Disp: , Rfl:  .  estradiol (ESTRACE) 0.1 MG/GM vaginal cream, Place 1 Applicatorful vaginally once a week., Disp: , Rfl:  .  ferrous sulfate 325 (65 FE) MG EC tablet, Take 1 tablet (325 mg total) by mouth 2 (two) times daily with a meal., Disp: 60 tablet, Rfl: 3 .  glucose blood (ONE TOUCH ULTRA TEST) test strip, USE TO CHECK BLOOD SUGAR DAILY. E 11.9 (TYPE 2 DIABETES MELLITIS), Disp: 100 each, Rfl: 4 .  levothyroxine (SYNTHROID, LEVOTHROID) 125 MCG tablet, TAKE 1 TABLET BY MOUTH IN THE MORNING ON AN EMPTY  STOMACH FOR THYROID, Disp: 90 tablet, Rfl: 3 .  lisinopril-hydrochlorothiazide (PRINZIDE,ZESTORETIC) 10-12.5 MG tablet, Take 1 tablet by mouth daily., Disp: 90 tablet, Rfl: 3 .  metFORMIN (GLUCOPHAGE) 1000 MG tablet, Take 1 tablet (1,000 mg total) by mouth daily after supper. (Patient taking differently: Take 1,000 mg by mouth daily with breakfast. ), Disp: 3 tablet, Rfl: 3 .  metoprolol (LOPRESSOR) 50 MG tablet, Take 50 mg by mouth 2 (two) times daily. , Disp: , Rfl:  .  Multiple Vitamins-Minerals (PRESERVISION AREDS 2 PO), Take 2 capsules by mouth daily.,  Disp: , Rfl:  .  nitroGLYCERIN (NITROSTAT) 0.4 MG SL tablet, Place 1 tablet (0.4 mg total) under the tongue every 5 (five) minutes as needed for chest pain., Disp: 30 tablet, Rfl: 0 .  OXYQUINOLONE SULFATE VAGINAL (TRIMO-SAN) 0.025 % GEL, Place 1 Applicatorful vaginally every 30 (thirty) days., Disp: 113.4 g, Rfl: 6 .  pantoprazole (PROTONIX) 40 MG tablet, Take by mouth., Disp: , Rfl:  .  simvastatin (ZOCOR) 40 MG tablet, TAKE 1 TABLET BY MOUTH EVERY DAY FOR CHOLESTEROL, Disp: 30 tablet, Rfl: 12 .  triamcinolone cream (KENALOG) 0.1 %, Apply 1 application topically 2 (two) times daily., Disp: 30 g, Rfl: 0 .  vitamin B-12 (CYANOCOBALAMIN) 1000 MCG tablet, Take 1,000 mcg by mouth daily. , Disp: , Rfl:   Review of Systems  Constitutional: Negative for appetite change, chills, fatigue and fever.  Respiratory: Negative for chest tightness and shortness of breath.   Cardiovascular: Negative for chest pain and palpitations.  Gastrointestinal: Negative for abdominal pain, nausea and vomiting.  Neurological: Negative for dizziness and weakness.    Social History   Tobacco Use  . Smoking status: Former Smoker    Quit date: 03/03/1988    Years since quitting: 30.8  . Smokeless tobacco: Never Used  Substance Use Topics  . Alcohol use: No    Alcohol/week: 0.0 standard drinks      Objective:   BP 138/70 (BP Location: Left Arm, Patient Position: Sitting, Cuff Size: Normal)   Pulse 67   Temp (!) 97.1 F (36.2 C) (Temporal)   Resp 16   Wt 151 lb (68.5 kg)   SpO2 95% Comment: room air  BMI 26.75 kg/m  Vitals:   01/05/19 1110  BP: 138/70  Pulse: 67  Resp: 16  Temp: (!) 97.1 F (36.2 C)  TempSrc: Temporal  SpO2: 95%  Weight: 151 lb (68.5 kg)  Body mass index is 26.75 kg/m.   Physical Exam   General Appearance:    Well developed, well nourished female in no acute distress  Eyes:    PERRL, conjunctiva/corneas clear, EOM's intact       Lungs:     Clear to auscultation bilaterally,  respirations unlabored  Heart:    Normal heart rate. Normal rhythm. No murmurs, rubs, or gallops.   MS:   All extremities are intact.   Neurologic:   Awake, alert, oriented x 3. No apparent focal neurological           defect.        Results for orders placed or performed in visit on 01/05/19  POCT HgB A1C  Result Value Ref Range   Hemoglobin A1C 9.0 (A) 4.0 - 5.6 %   Est. average glucose Bld gHb Est-mCnc 212        Assessment & Plan    1. Type 2 diabetes mellitus without complication, without long-term current use of insulin (HCC) Uncontrolled, will double- metFORMIN (GLUCOPHAGE) 1000 MG tablet;  to tablet (1,000 mg total) by mouth 2 (two) times daily. Take with food  Dispense: 180 tablet; Refill: 3  2. Type 2 diabetes mellitus with hyperglycemia, without long-term current use of insulin (HCC)   3. Aortic atherosclerosis (HCC) Asymptomatic. Compliant with medication.  Continue aggressive risk factor modification.    4. Hypothyroidism, unspecified type Lab Results  Component Value Date   TSH 4.870 (H) 02/24/2018   refill- levothyroxine (SYNTHROID) 125 MCG tablet; TAKE 1 TABLET BY MOUTH IN THE MORNING ON AN EMPTY STOMACH FOR THYROID  Dispense: 90 tablet; Refill: 3     [name], MD  [facility]

## 2019-01-05 NOTE — Patient Instructions (Signed)
.   Please review the attached list of medications and notify my office if there are any errors.   . Please bring all of your medications to every appointment so we can make sure that our medication list is the same as yours.   . It is especially important to get the annual flu vaccine this year. If you haven't had it already, please go to your pharmacy or call the office as soon as possible to schedule you flu shot.  

## 2019-01-09 ENCOUNTER — Ambulatory Visit: Payer: Self-pay

## 2019-01-09 ENCOUNTER — Telehealth: Payer: Self-pay

## 2019-01-09 NOTE — Chronic Care Management (AMB) (Signed)
  Chronic Care Management   Outreach Note  01/09/2019 Name: April Pittman MRN: [id number] DOB: 06/09/1940  Referred by: Birdie Sons, MD Reason for referral : Chronic Care Management   Third unsuccessful telephone outreach was attempted today. April Pittman was referred to the care management team for assistance with chronic care management and care coordination. Her primary care provider will be notified of our unsuccessful attempts to maintain contact. The care management team will gladly outreach at any time in the future if she is interested in receiving assistance.   Follow Up Plan:  The care management team will gladly follow up with April Pittman after the primary care provider has a conversation with her regarding recommendation for care management engagement and subsequent re-referral to the care management team.   Horris Latino [facility] Care Management [phone]

## 2019-01-23 DIAGNOSIS — H353211 Exudative age-related macular degeneration, right eye, with active choroidal neovascularization: Secondary | ICD-10-CM | POA: Diagnosis not present

## 2019-01-23 DIAGNOSIS — H353221 Exudative age-related macular degeneration, left eye, with active choroidal neovascularization: Secondary | ICD-10-CM | POA: Diagnosis not present

## 2019-01-23 LAB — HM DIABETES EYE EXAM

## 2019-02-09 ENCOUNTER — Other Ambulatory Visit: Payer: Self-pay | Admitting: Family Medicine

## 2019-02-12 ENCOUNTER — Other Ambulatory Visit: Payer: Self-pay

## 2019-02-12 ENCOUNTER — Inpatient Hospital Stay: Payer: PPO

## 2019-02-12 DIAGNOSIS — I10 Essential (primary) hypertension: Secondary | ICD-10-CM | POA: Diagnosis not present

## 2019-02-12 DIAGNOSIS — Z7984 Long term (current) use of oral hypoglycemic drugs: Secondary | ICD-10-CM | POA: Diagnosis not present

## 2019-02-12 DIAGNOSIS — Z85820 Personal history of malignant melanoma of skin: Secondary | ICD-10-CM | POA: Diagnosis not present

## 2019-02-12 DIAGNOSIS — Z7982 Long term (current) use of aspirin: Secondary | ICD-10-CM | POA: Insufficient documentation

## 2019-02-12 DIAGNOSIS — Z8673 Personal history of transient ischemic attack (TIA), and cerebral infarction without residual deficits: Secondary | ICD-10-CM | POA: Insufficient documentation

## 2019-02-12 DIAGNOSIS — D509 Iron deficiency anemia, unspecified: Secondary | ICD-10-CM

## 2019-02-12 DIAGNOSIS — Z86010 Personal history of colonic polyps: Secondary | ICD-10-CM | POA: Insufficient documentation

## 2019-02-12 DIAGNOSIS — I2510 Atherosclerotic heart disease of native coronary artery without angina pectoris: Secondary | ICD-10-CM | POA: Insufficient documentation

## 2019-02-12 DIAGNOSIS — Z87891 Personal history of nicotine dependence: Secondary | ICD-10-CM | POA: Diagnosis not present

## 2019-02-12 DIAGNOSIS — E039 Hypothyroidism, unspecified: Secondary | ICD-10-CM | POA: Insufficient documentation

## 2019-02-12 DIAGNOSIS — Z79899 Other long term (current) drug therapy: Secondary | ICD-10-CM | POA: Insufficient documentation

## 2019-02-12 DIAGNOSIS — K219 Gastro-esophageal reflux disease without esophagitis: Secondary | ICD-10-CM | POA: Diagnosis not present

## 2019-02-12 DIAGNOSIS — K589 Irritable bowel syndrome without diarrhea: Secondary | ICD-10-CM | POA: Diagnosis not present

## 2019-02-12 DIAGNOSIS — M810 Age-related osteoporosis without current pathological fracture: Secondary | ICD-10-CM | POA: Diagnosis not present

## 2019-02-12 DIAGNOSIS — E119 Type 2 diabetes mellitus without complications: Secondary | ICD-10-CM | POA: Diagnosis not present

## 2019-02-12 DIAGNOSIS — E7800 Pure hypercholesterolemia, unspecified: Secondary | ICD-10-CM | POA: Diagnosis not present

## 2019-02-12 LAB — CBC WITH DIFFERENTIAL/PLATELET
Abs Immature Granulocytes: 0.02 10*3/uL (ref 0.00–0.07)
Basophils Absolute: 0.1 10*3/uL (ref 0.0–0.1)
Basophils Relative: 1 %
Eosinophils Absolute: 0.5 10*3/uL (ref 0.0–0.5)
Eosinophils Relative: 7 %
HCT: 39 % (ref 36.0–46.0)
Hemoglobin: 12.9 g/dL (ref 12.0–15.0)
Immature Granulocytes: 0 %
Lymphocytes Relative: 26 %
Lymphs Abs: 1.9 10*3/uL (ref 0.7–4.0)
MCH: 27.7 pg (ref 26.0–34.0)
MCHC: 33.1 g/dL (ref 30.0–36.0)
MCV: 83.9 fL (ref 80.0–100.0)
Monocytes Absolute: 0.6 10*3/uL (ref 0.1–1.0)
Monocytes Relative: 8 %
Neutro Abs: 4.4 10*3/uL (ref 1.7–7.7)
Neutrophils Relative %: 58 %
Platelets: 330 10*3/uL (ref 150–400)
RBC: 4.65 MIL/uL (ref 3.87–5.11)
RDW: 12.7 % (ref 11.5–15.5)
WBC: 7.4 10*3/uL (ref 4.0–10.5)
nRBC: 0 % (ref 0.0–0.2)

## 2019-02-12 LAB — IRON AND TIBC
Iron: 87 ug/dL (ref 28–170)
Saturation Ratios: 26 % (ref 10.4–31.8)
TIBC: 333 ug/dL (ref 250–450)
UIBC: 246 ug/dL

## 2019-02-12 LAB — FERRITIN: Ferritin: 84 ng/mL (ref 11–307)

## 2019-02-12 NOTE — Progress Notes (Signed)
Patient would like a telephone visit if her labs are normal.

## 2019-02-13 ENCOUNTER — Inpatient Hospital Stay: Payer: PPO

## 2019-02-13 ENCOUNTER — Inpatient Hospital Stay: Payer: PPO | Attending: Oncology | Admitting: Oncology

## 2019-02-13 ENCOUNTER — Encounter: Payer: Self-pay | Admitting: Oncology

## 2019-02-13 ENCOUNTER — Other Ambulatory Visit: Payer: Self-pay

## 2019-02-13 VITALS — BP 131/76 | HR 74 | Temp 98.2°F | Resp 18 | Wt 148.0 lb

## 2019-02-13 DIAGNOSIS — M810 Age-related osteoporosis without current pathological fracture: Secondary | ICD-10-CM | POA: Diagnosis not present

## 2019-02-13 DIAGNOSIS — D509 Iron deficiency anemia, unspecified: Secondary | ICD-10-CM

## 2019-02-13 NOTE — Progress Notes (Signed)
Patient does not offer any problems today.  

## 2019-02-13 NOTE — Progress Notes (Signed)
Hematology/Oncology Follow up note [facility] [phone] [phone]   Patient Care Team: Birdie Sons, MD as PCP - General (Family Medicine) Ubaldo Glassing Javier Docker, MD as Consulting Physician (Cardiology) Leandrew Koyanagi, MD as Referring Physician (Ophthalmology) Manya Silvas, MD (Inactive) (Gastroenterology) Neldon Labella, RN as Case Manager  REFERRING PROVIDER: Dr.Sianani. REASON FOR VISIT:  Follow up for  iron deficiency anemia  HISTORY OF PRESENTING ILLNESS:  April Pittman is a  78 y.o.  female with PMH listed below who was referred to me for evaluation of iron deficiency anemia. Patient was recently admitted from 02/18/2018-02/22/2017 due to UTI/pyelonephritis, Sepsis. Treatment with broad spectrum of IV antibiotics with meropenem initially and then narrowed down to ceftriaxone. Urine culture positive for E coli. Also had acute respiratory failure with hypoxia, due to volume overload and pulmonary edema. Treated with IV lasix and responded well.   During the admission, she was found to have severe microcytic anemic, iron deficiency, received one dose of IV Venofer and was discharged with oral iron supplements. Referred to hematology for further evaluation.   Associated signs and symptoms: Patient reports fatigue.  Mild SOB with exertion.  Denies weight loss, easy bruising, hematochezia, hemoptysis, hematuria. Context:  Rectal bleeding: denies Menstrual bleeding/ Vaginal bleeding : deneis Hematemesis or hemoptysis : denies Blood in urine : denies  Last endoscopy: 09/28/2013 Colonoscopy showed internal hemorrhoids, no polyps. Recommend to repeat in 09/2018 due to polyps found on previous colonoscopy.  Fatigue: Yes.   #  EGD and colonoscopy on 05/12/2018. 2 sessile polyps were found in the ascending colon and cecum.  Removed.  Internal hemorrhoids. EGD showed normal esophagus, small hiatal hernia.  Gastritis. No  malignancy/dysplasia was discovered.  INTERVAL HISTORY [name] is a 78 y.o. female who has above history reviewed by me today presents for follow up visit for management of iron deficiency anemia.  Patient reports doing well today.  No new complaints.  Patient was last seen by me on 04/14/2018.  Patient has received 4 doses of IV Venofer. Reports feeling pretty well.  Fatigue has improved Review of Systems  Constitutional: Negative for appetite change, chills, fatigue, fever and unexpected weight change.  HENT:   Negative for hearing loss and voice change.   Eyes: Negative for eye problems.  Respiratory: Negative for chest tightness and cough.   Cardiovascular: Negative for chest pain.  Gastrointestinal: Negative for abdominal distention, abdominal pain, blood in stool, nausea, rectal pain and vomiting.  Endocrine: Negative for hot flashes.  Genitourinary: Negative for difficulty urinating and frequency.   Musculoskeletal: Negative for arthralgias.  Skin: Negative for itching and rash.  Neurological: Negative for extremity weakness.  Hematological: Negative for adenopathy.  Psychiatric/Behavioral: Negative for confusion.    MEDICAL HISTORY:  Past Medical History:  Diagnosis Date  . Alopecia   . Anginal pain ([place])   . Benign neoplasm of large bowel   . CAD (coronary artery disease)   . Carpal tunnel syndrome   . Complication of anesthesia    vomits with any pain medicine  . DM (diabetes mellitus) ([place])   . Erosion of cervix   . GERD (gastroesophageal reflux disease)   . H/O adenomatous polyp of colon    with sever dysplasia, removed 2000 by colonoscopy  . History of chicken pox   . Hypercholesteremia   . Hypertension   . Hypothyroid   . Irritable bowel   . Malignant melanoma of foot ([place])   . Osteoporosis   .  PONV (postoperative nausea and vomiting)   . Post-menopausal bleeding   . Procidentia of uterus    with traction cystocele, managed with gellhorn pessary    . Rectal bleeding   . Sepsis ([place]) 02/18/2018  . TIA (transient ischemic attack)     SURGICAL HISTORY: Past Surgical History:  Procedure Laterality Date  . BREAST EXCISIONAL BIOPSY Right    Benign  . CARDIAC CATHETERIZATION  10/2010   +stenting to LAD and RCA; symptoms: chest pain, elevated Troponin, +AMI. [employer]  . CATARACT EXTRACTION W/PHACO Right 07/28/2016   Procedure: CATARACT EXTRACTION PHACO AND INTRAOCULAR LENS PLACEMENT ([place])  Right Diabetic;  Surgeon: Leandrew Koyanagi, MD;  Location: [facility];  Service: Ophthalmology;  Laterality: Right;  Diabetic leave patient at 9 arrival  . CATARACT EXTRACTION W/PHACO Left 10/27/2016   Procedure: CATARACT EXTRACTION PHACO AND INTRAOCULAR LENS PLACEMENT ([place]) LEFT DIABETIC;  Surgeon: Leandrew Koyanagi, MD;  Location: [facility];  Service: Ophthalmology;  Laterality: Left;  . COLONOSCOPY  03/2008   2 polyps, internal and external hemorrhoids, +anal growth. Dr. Vira Agar  . COLONOSCOPY WITH PROPOFOL N/A 05/12/2018   Procedure: COLONOSCOPY WITH PROPOFOL;  Surgeon: Manya Silvas, MD;  Location[facility] ENDOSCOPY;  Service: Endoscopy;  Laterality: N/A;  . ESOPHAGOGASTRODUODENOSCOPY (EGD) WITH PROPOFOL N/A 05/12/2018   Procedure: ESOPHAGOGASTRODUODENOSCOPY (EGD) WITH PROPOFOL;  Surgeon: Manya Silvas, MD;  Location: [facility] ENDOSCOPY;  Service: Endoscopy;  Laterality: N/A;  . KNEE SURGERY    . TONSILLECTOMY     as a child    SOCIAL HISTORY: Social History   Socioeconomic History  . Marital status: Married    Spouse name: Not on file  . Number of children: 3  . Years of education: Not on file  . Highest education level: GED or equivalent  Occupational History  . Occupation: Retired    Comment: former Engineer, manufacturing systems  Tobacco Use  . Smoking status: Former Smoker    Quit date: 03/03/1988    Years since quitting: 30.9  . Smokeless tobacco: Never Used  Substance and Sexual Activity  . Alcohol use: No     Alcohol/week: 0.0 standard drinks  . Drug use: No  . Sexual activity: Not Currently  Other Topics Concern  . Not on file  Social History Narrative  . Not on file   Social Determinants of Health   Financial Resource Strain:   . Difficulty of Paying Living Expenses: Not on file  Food Insecurity:   . Worried About Charity fundraiser in the Last Year: Not on file  . Ran Out of Food in the Last Year: Not on file  Transportation Needs:   . Lack of Transportation (Medical): Not on file  . Lack of Transportation (Non-Medical): Not on file  Physical Activity: Inactive  . Days of Exercise per Week: 0 days  . Minutes of Exercise per Session: 0 min  Stress:   . Feeling of Stress : Not on file  Social Connections: Unknown  . Frequency of Communication with Friends and Family: Patient refused  . Frequency of Social Gatherings with Friends and Family: Patient refused  . Attends Religious Services: Patient refused  . Active Member of Clubs or Organizations: Patient refused  . Attends Archivist Meetings: Patient refused  . Marital Status: Patient refused  Intimate Partner Violence: Unknown  . Fear of Current or Ex-Partner: Patient refused  . Emotionally Abused: Patient refused  . Physically Abused: Patient refused  . Sexually Abused: Patient refused  FAMILY HISTORY: Family History  Problem Relation Age of Onset  . Lung cancer Brother   . Diabetes Father   . Heart disease Sister        Valvular rheumatic heart disease  . Rheumatic fever Sister   . Heart disease Paternal Grandmother   . Breast cancer Neg Hx   . Ovarian cancer Neg Hx   . Colon cancer Neg Hx     ALLERGIES:  has No Known Allergies.  MEDICATIONS:  Current Outpatient Medications  Medication Sig Dispense Refill  . aspirin EC 81 MG tablet Take 81 mg by mouth daily.    . calcium carbonate (OS-CAL) 1250 (500 Ca) MG chewable tablet Chew 1 tablet by mouth 2 (two) times daily.    . clopidogrel (PLAVIX) 75 MG  tablet Take 75 mg by mouth daily.    Marland Kitchen estradiol (ESTRACE) 0.1 MG/GM vaginal cream Place 1 Applicatorful vaginally once a week.    . levothyroxine (SYNTHROID) 125 MCG tablet TAKE 1 TABLET BY MOUTH IN THE MORNING ON AN EMPTY STOMACH FOR THYROID 90 tablet 3  . lisinopril-hydrochlorothiazide (PRINZIDE,ZESTORETIC) 10-12.5 MG tablet Take 1 tablet by mouth daily. 90 tablet 3  . metFORMIN (GLUCOPHAGE) 1000 MG tablet Take 1 tablet (1,000 mg total) by mouth 2 (two) times daily. Take with food 180 tablet 3  . metoprolol (LOPRESSOR) 50 MG tablet Take 50 mg by mouth 2 (two) times daily.     . Multiple Vitamins-Minerals (PRESERVISION AREDS 2 PO) Take 2 capsules by mouth daily.    . nitroGLYCERIN (NITROSTAT) 0.4 MG SL tablet Place 1 tablet (0.4 mg total) under the tongue every 5 (five) minutes as needed for chest pain. 30 tablet 0  . ONETOUCH ULTRA test strip USE TO CHECK BLOOD SUGAR DAILY. E 11.9 (TYPE 2 DIABETES MELLITIS) 100 strip 4  . OXYQUINOLONE SULFATE VAGINAL (TRIMO-SAN) 0.025 % GEL Place 1 Applicatorful vaginally every 30 (thirty) days. 113.4 g 6  . pantoprazole (PROTONIX) 40 MG tablet Take by mouth.    . simvastatin (ZOCOR) 40 MG tablet TAKE 1 TABLET BY MOUTH EVERY DAY FOR CHOLESTEROL 30 tablet 12  . triamcinolone cream (KENALOG) 0.1 % Apply 1 application topically 2 (two) times daily. 30 g 0  . vitamin B-12 (CYANOCOBALAMIN) 1000 MCG tablet Take 1,000 mcg by mouth daily.     . ferrous sulfate 325 (65 FE) MG EC tablet Take 1 tablet (325 mg total) by mouth 2 (two) times daily with a meal. (Patient not taking: Reported on 02/13/2019) 60 tablet 3   No current facility-administered medications for this visit.     PHYSICAL EXAMINATION: ECOG PERFORMANCE STATUS: 1 - Symptomatic but completely ambulatory Vitals:   02/13/19 1305  BP: 131/76  Pulse: 74  Resp: 18  Temp: 98.2 F (36.8 C)   Filed Weights   02/13/19 1305  Weight: 148 lb (67.1 kg)    Physical Exam Constitutional:      General: She  is not in acute distress.    Appearance: She is not ill-appearing.  HENT:     Head: Normocephalic and atraumatic.  Eyes:     General: No scleral icterus.    Pupils: Pupils are equal, round, and reactive to light.  Cardiovascular:     Rate and Rhythm: Normal rate and regular rhythm.     Heart sounds: Normal heart sounds.  Pulmonary:     Effort: Pulmonary effort is normal. No respiratory distress.     Breath sounds: No wheezing.  Abdominal:     General:  Bowel sounds are normal. There is no distension.     Palpations: Abdomen is soft. There is no mass.     Tenderness: There is no abdominal tenderness.  Musculoskeletal:        General: No deformity. Normal range of motion.     Cervical back: Normal range of motion and neck supple.  Skin:    General: Skin is warm and dry.     Coloration: Skin is not pale.     Findings: No erythema or rash.  Neurological:     Mental Status: She is alert and oriented to person, place, and time.     Cranial Nerves: No cranial nerve deficit.     Coordination: Coordination normal.  Psychiatric:        Behavior: Behavior normal.        Thought Content: Thought content normal.      LABORATORY DATA:  I have reviewed the data as listed Lab Results  Component Value Date   WBC 7.4 02/12/2019   HGB 12.9 02/12/2019   HCT 39.0 02/12/2019   MCV 83.9 02/12/2019   PLT 330 02/12/2019   Recent Labs    02/18/18 1032 02/22/18 0309 02/24/18 1126 06/08/18 2233  NA 132* 137 138 137  K 2.9* 3.8 4.0 3.6  CL 95* 105 100 101  CO2 23 24 23 25   GLUCOSE 255* 153* 138* 239*  BUN 24* 26* 14 23  CREATININE 1.42* 1.16* 0.89 0.62  CALCIUM 8.2* 8.2* 9.6 9.2  GFRNONAA 36* 45* 63 >60  GFRAA 41* 53* 72 >60  PROT 7.1  --   --  7.5  ALBUMIN 3.2*  --  3.4* 3.8  AST 28  --   --  17  ALT 11  --   --  10  ALKPHOS 99  --   --  76  BILITOT 0.9  --   --  0.4   Iron/TIBC/Ferritin/ %Sat    Component Value Date/Time   IRON 87 02/12/2019 1237   TIBC 333 02/12/2019 1237    FERRITIN 84 02/12/2019 1237   IRONPCTSAT 26 02/12/2019 1237     RADIOGRAPHIC STUDIES: I have personally reviewed the radiological images as listed and agreed with the findings in the report. DEXAScan  Result Date: 12/14/2018 EXAM: DUAL X-RAY ABSORPTIOMETRY (DXA) FOR BONE MINERAL DENSITY IMPRESSION: Technologist: SCE PATIENT BIOGRAPHICAL: Name: Yarithza, Arvie Patient ID: [id number] Birth Date: [birth year] Height: 62.5 in. Gender: Female Exam Date: 12/14/2018 Weight: 151.6 lbs. Indications: Advanced Age, Caucasian, Diabetic, Height Loss, History of Fracture (Adult), Hypothyroid, Osteoarthritis, Osteoporotic, Postmenopausal, Vitamin D Deficiency Fractures: Left hand, Ribs, Right patella, Right shoulder Treatments: calcium w/ vit D, Levothyroxine, Metformin, Multi-Vitamin, Protonix, Vitamin D ASSESSMENT: The BMD measured at AP Spine L1-L4 is 0.868 g/cm2 with a T-score of -2.6. This patient is considered OSTEOPOROTIC according to [facility]) criteria. The scan quality was limited. Site Region Measured Measured WHO Young Adult BMD Date       Age      Classification T-score AP Spine L1-L4 12/14/2018 78.0 Osteoporosis -2.6 0.868 g/cm2 AP Spine L1-L4 11/17/2016 75.9 Osteoporosis -2.8 0.851 g/cm2 AP Spine L1-L4 10/04/2013 72.8 Osteoporosis -2.8 0.842 g/cm2 DualFemur Neck Right 12/14/2018 78.0 Osteoporosis -2.5 0.688 g/cm2 DualFemur Neck Right 11/17/2016 75.9 Osteoporosis -2.7 0.664 g/cm2 DualFemur Neck Right 10/04/2013 72.8 Osteopenia -2.0 0.766 g/cm2 DualFemur Total Mean 12/14/2018 78.0 Osteopenia -1.5 0.818 g/cm2 DualFemur Total Mean 11/17/2016 75.9 Osteopenia -1.5 0.822 g/cm2 DualFemur Total Mean 10/04/2013 72.8 Osteopenia -1.1 0.865 g/cm2 [place]  Organization (WHO) criteria for post-menopausal, Caucasian Women: Normal:       T-score at or above -1 SD Osteopenia:   T-score between -1 and -2.5 SD Osteoporosis: T-score at or below -2.5 SD RECOMMENDATIONS: 1. All patients should optimize  calcium and vitamin D intake. 2. Consider FDA-approved medical therapies in postmenopausal women and men aged 31 years and older, based on the following: a. A hip or vertebral(clinical or morphometric) fracture b. T-score < -2.5 at the femoral neck or spine after appropriate evaluation to exclude secondary causes c. Low bone mass (T-score between -1.0 and -2.5 at the femoral neck or spine) and a 10-year probability of a hip fracture > 3% or a 10-year probability of a major osteoporosis-related fracture > 20% based on the [place]-adapted WHO algorithm d. Clinician judgment and/or patient preferences may indicate treatment for people with 10-year fracture probabilities above or below these levels FOLLOW-UP: People with diagnosed cases of osteoporosis or at high risk for fracture should have regular bone mineral density tests. For patients eligible for Medicare, routine testing is allowed once every 2 years. The testing frequency can be increased to one year for patients who have rapidly progressing disease, those who are receiving or discontinuing medical therapy to restore bone mass, or have additional risk factors. I have reviewed this report, and agree with the above findings. Mark A. Thornton Papas, M.D. [facility] Radiology Electronically Signed   By: Lavonia Dana M.D.   On: 12/14/2018 15:16     ASSESSMENT & PLAN:  1. Iron deficiency anemia, unspecified iron deficiency anemia type   2. Osteoporosis, unspecified osteoporosis type, unspecified pathological fracture presence   Labs reviewed and discussed with patient.  Stable hemoglobin and iron store. No need for additional IV iron or oral iron supplementation at this point.  Osteoporosis, recommend patient to take calcium and vitamin D supplementation.  Recommend patient to follow-up with primary care provider for management.  Patient will be discharged from my clinic.  Recommend patient continue follow-up with primary care provider.  If she develops new symptoms or  concerns, I am happy to see her again in clinic for further management. We spent sufficient time to discuss many aspect of care, questions were answered to patient's satisfaction. Total face to face encounter time for this patient visit was 15 min. >50% of the time was  spent in counseling and coordination of care.   Earlie Server, MD, PhD Hematology Oncology [facility] Pager- [phone] 02/13/2019

## 2019-03-15 ENCOUNTER — Ambulatory Visit: Payer: PPO

## 2019-03-20 IMAGING — DX DG CHEST 1V
1 series · 1 of 1 positions shown · non-contrast
Comparison: Chest x-ray earlier same day [DATE] a.m. and previously.

CLINICAL DATA: 77-year-old undergoing fluid resuscitation for
sepsis, now with acute onset of cough and hypoxemia requiring oxygen
therapy.

EXAM:
Portable CHEST 1 VIEW [DATE] p.m.:

[chest ap]
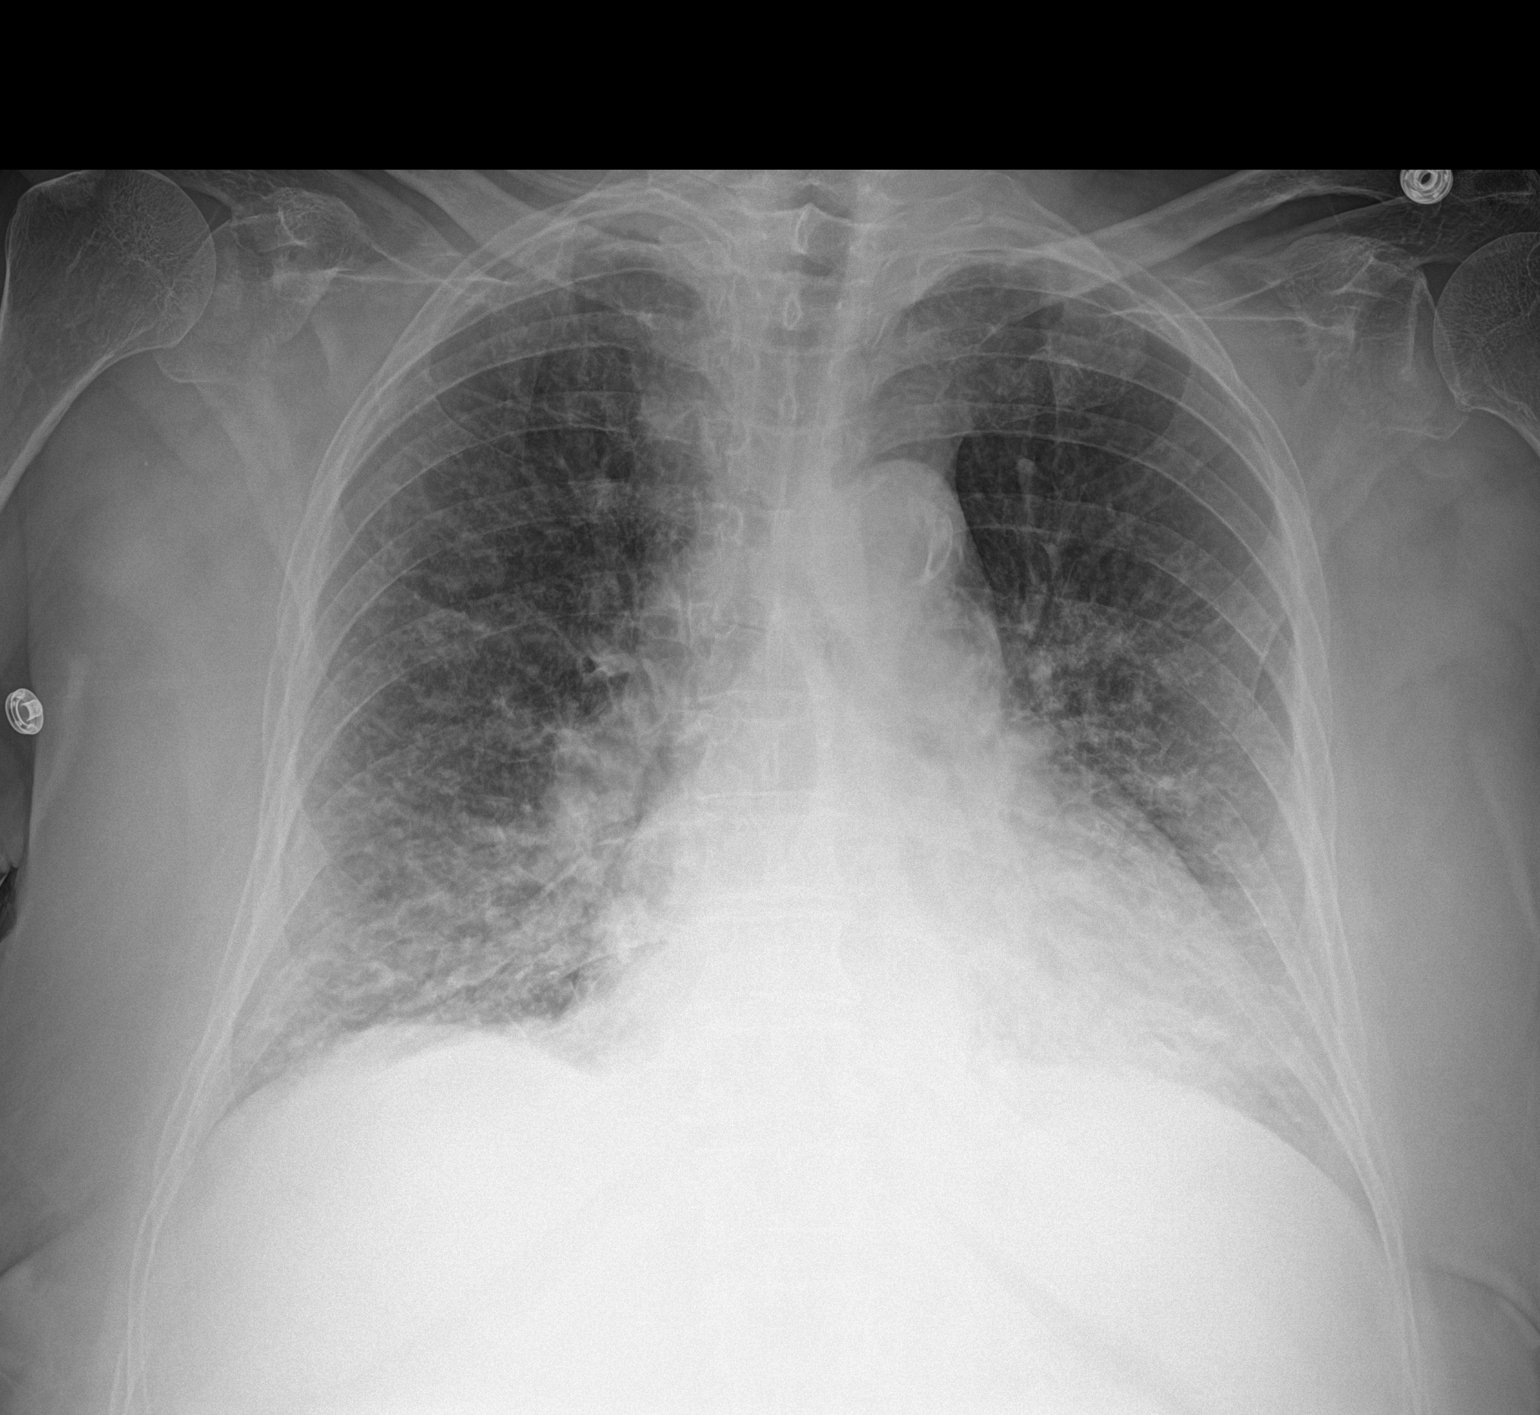

[1 of 1 positions shown; findings below may reference images not displayed]

FINDINGS: Since the examination earlier today, the patient has developed
moderate diffuse interstitial pulmonary edema. Cardiac silhouette
moderately enlarged. No confluent airspace consolidation. Possible
small BILATERAL pleural effusions.
IMPRESSION: Moderate acute CHF and/or fluid overload, with moderate cardiomegaly
and moderate diffuse interstitial pulmonary edema.

## 2019-03-29 ENCOUNTER — Other Ambulatory Visit: Payer: Self-pay | Admitting: Family Medicine

## 2019-03-29 NOTE — Telephone Encounter (Signed)
Requested medication (s) are due for refill today: yes  Requested medication (s) are on the active medication list: yes  Last refill:  04/19/18  Future visit scheduled: yes  Notes to clinic: Labs are needed    Requested Prescriptions  Pending Prescriptions Disp Refills   lisinopril-hydrochlorothiazide (ZESTORETIC) 10-12.5 MG tablet [Pharmacy Med Name: LISINOPRIL-HCTZ 10-12.5 MG TAB] 90 tablet 3    Sig: TAKE 1 TABLET BY MOUTH EVERY DAY      Cardiovascular:  ACEI + Diuretic Combos Failed - 03/29/2019  2:44 PM      Failed - Na in normal range and within 180 days    Sodium  Date Value Ref Range Status  06/08/2018 137 135 - 145 mmol/L Final  02/24/2018 138 134 - 144 mmol/L Final          Failed - K in normal range and within 180 days    Potassium  Date Value Ref Range Status  06/08/2018 3.6 3.5 - 5.1 mmol/L Final          Failed - Cr in normal range and within 180 days    Creatinine, Ser  Date Value Ref Range Status  06/08/2018 0.62 0.44 - 1.00 mg/dL Final   Creatinine, POC  Date Value Ref Range Status  10/05/2016 n/a mg/dL Final          Failed - Ca in normal range and within 180 days    Calcium  Date Value Ref Range Status  06/08/2018 9.2 8.9 - 10.3 mg/dL Final          Passed - Patient is not pregnant      Passed - Last BP in normal range    BP Readings from Last 1 Encounters:  02/13/19 131/76          Passed - Valid encounter within last 6 months    Recent Outpatient Visits           2 months ago Type 2 diabetes mellitus without complication, without long-term current use of insulin [facility])   [facility] Birdie Sons, MD   4 months ago [place], Donald E, MD   6 months ago Rib pain on right side   [place], Dionne Bucy, MD   9 months ago Elevated lipase   [facility] Birdie Sons, MD   11 months ago Essential hypertension   [place], Kirstie Peri, MD       Future Appointments             In 1 month [place], Kirstie Peri, MD [facility] [place]

## 2019-04-17 DIAGNOSIS — H353211 Exudative age-related macular degeneration, right eye, with active choroidal neovascularization: Secondary | ICD-10-CM | POA: Diagnosis not present

## 2019-04-17 DIAGNOSIS — H353221 Exudative age-related macular degeneration, left eye, with active choroidal neovascularization: Secondary | ICD-10-CM | POA: Diagnosis not present

## 2019-05-10 NOTE — Progress Notes (Signed)
Patient: April Pittman   DOB: 01/20/1941   79 y.o. Female  MRN: [id number] Visit Date: 05/11/2019  Today's Provider: [name], MD  Subjective:    Chief Complaint  Patient presents with  . Diabetes  . Hyperlipidemia   HPI  Diabetes Mellitus Type II, Follow-up:  Lab Results  Component Value Date   HGBA1C 9.0 (A) 01/05/2019    Last seen for diabetes 4 months ago.  Management since then includes patient advised to double Metformin to 100 mg BID.  She reports good compliance with treatment. She is not having side effects.   Current symptoms include none  Home blood sugar records: FBS- elevated per patient   Episodes of hypoglycemia? no              Current insulin regiment: Is not on insulin Most Recent Eye Exam: 11/14/2018. Sees Dr. Wallace Going Weight trend: stable Prior visit with dietician: No Current exercise: none Current diet habits: in general, an "unhealthy" diet  Pertinent Labs: Lab Results  Component Value Date   CHOL 202 (H) 06/21/2018   HDL 57 06/21/2018   LDLCALC 90 06/21/2018   LDLDIRECT 73 05/11/2017   TRIG 276 (H) 06/21/2018   CHOLHDL 3.5 06/21/2018        Wt Readings from Last 3 Encounters:  01/05/19 151 lb (68.5 kg)  12/05/18 152 lb (68.9 kg)  11/27/18 151 lb 3.2 oz (68.6 kg)    ------------------------------------------------------------------------  Lipid/Cholesterol, Follow-up:   Last seen for this 4 months ago.  Management changes since that visit include no change . Last Lipid Panel: Labs (Brief)          Component Value Date/Time   CHOL 202 (H) 06/21/2018 1102   TRIG 276 (H) 06/21/2018 1102   HDL 57 06/21/2018 1102   CHOLHDL 3.5 06/21/2018 1102   LDLCALC 90 06/21/2018 1102   LDLDIRECT 73 05/11/2017 1145      Risk factors for vascular disease include diabetes mellitus and hypercholesterolemia  She reports good compliance with treatment. She is not having side effects.  Current symptoms  include none  Weight trend: stable Prior visit with dietician: no Current diet: in general, an "unhealthy" diet Current exercise: none     Wt Readings from Last 3 Encounters:  01/05/19 151 lb (68.5 kg)  12/05/18 152 lb (68.9 kg)  11/27/18 151 lb 3.2 oz (68.6 kg)        Medications: Outpatient Medications Prior to Visit  Medication Sig Dispense Refill  . aspirin EC 81 MG tablet Take 81 mg by mouth daily.    . calcium carbonate (OS-CAL) 1250 (500 Ca) MG chewable tablet Chew 1 tablet by mouth 2 (two) times daily.    . clopidogrel (PLAVIX) 75 MG tablet Take 75 mg by mouth daily.    Marland Kitchen estradiol (ESTRACE) 0.1 MG/GM vaginal cream Place 1 Applicatorful vaginally once a week.    . levothyroxine (SYNTHROID) 125 MCG tablet TAKE 1 TABLET BY MOUTH IN THE MORNING ON AN EMPTY STOMACH FOR THYROID 90 tablet 3  . lisinopril-hydrochlorothiazide (ZESTORETIC) 10-12.5 MG tablet TAKE 1 TABLET BY MOUTH EVERY DAY 90 tablet 3  . metFORMIN (GLUCOPHAGE) 1000 MG tablet Take 1 tablet (1,000 mg total) by mouth 2 (two) times daily. Take with food 180 tablet 3  . metoprolol (LOPRESSOR) 50 MG tablet Take 50 mg by mouth 2 (two) times daily.     . Multiple Vitamins-Minerals (PRESERVISION AREDS 2 PO) Take 2 capsules by mouth daily.    Marland Kitchen  nitroGLYCERIN (NITROSTAT) 0.4 MG SL tablet Place 1 tablet (0.4 mg total) under the tongue every 5 (five) minutes as needed for chest pain. 30 tablet 0  . ONETOUCH ULTRA test strip USE TO CHECK BLOOD SUGAR DAILY. E 11.9 (TYPE 2 DIABETES MELLITIS) 100 strip 4  . OXYQUINOLONE SULFATE VAGINAL (TRIMO-SAN) 0.025 % GEL Place 1 Applicatorful vaginally every 30 (thirty) days. 113.4 g 6  . pantoprazole (PROTONIX) 40 MG tablet Take by mouth.    . simvastatin (ZOCOR) 40 MG tablet TAKE 1 TABLET BY MOUTH EVERY DAY FOR CHOLESTEROL 30 tablet 12  . triamcinolone cream (KENALOG) 0.1 % Apply 1 application topically 2 (two) times daily. 30 g 0  . vitamin B-12 (CYANOCOBALAMIN) 1000 MCG tablet Take 1,000  mcg by mouth daily.      No facility-administered medications prior to visit.      Review of Systems  Constitutional: Negative.   Respiratory: Negative.   Cardiovascular: Negative.   Musculoskeletal: Negative.       Objective:   BP (!) 188/92 (BP Location: Right Arm, Patient Position: Sitting, Cuff Size: Normal)   Pulse 68   Temp (!) 96.9 F (36.1 C) (Temporal)   Wt 148 lb 12.8 oz (67.5 kg)   BMI 26.36 kg/m  Vitals:   05/11/19 1111  BP: (!) 188/92  Pulse: 68  Temp: (!) 96.9 F (36.1 C)  TempSrc: Temporal  Weight: 148 lb 12.8 oz (67.5 kg)     Physical Exam    General: Appearance:     Well developed, well nourished female in no acute distress  Eyes:    PERRL, conjunctiva/corneas clear, EOM's intact       Lungs:     Clear to auscultation bilaterally, respirations unlabored  Heart:    Normal heart rate. Irregular rhythm. No murmurs, rubs, or gallops.   MS:   All extremities are intact.   Neurologic:   Awake, alert, oriented x 3. No apparent focal neurological           defect.        Results for orders placed or performed in visit on 05/11/19  POCT HgB A1C  Result Value Ref Range   Hemoglobin A1C 7.4 (A) 4.0 - 5.6 %   Est. average glucose Bld gHb Est-mCnc 166    EKG- Frequent PACs    Assessment & Plan:    1. Type 2 diabetes mellitus with hyperglycemia, without long-term current use of insulin (HCC) Well controlled.  Continue current medications.    2. Essential hypertension SBP not controlled. Consider increasing medications or adding CCB after reviewing labs.  - EKG 12-Lead  3. Hypothyroidism, unspecified type  - TSH  4. Aortic atherosclerosis (HCC) Asymptomatic. Compliant with medication.  Continue aggressive risk factor modification.   - Comprehensive metabolic panel - Lipid panel  5. PAC (premature atrial contraction) asymptomatic   The entirety of the information documented in the History of Present Illness, Review of Systems and Physical  Exam were personally obtained by me. Portions of this information were initially documented by Idelle Jo, CMA and reviewed by me for thoroughness and accuracy.   [name], MD  [facility] [phone] (phone) [phone] (fax)  [place]

## 2019-05-11 ENCOUNTER — Encounter: Payer: Self-pay | Admitting: Family Medicine

## 2019-05-11 ENCOUNTER — Ambulatory Visit (INDEPENDENT_AMBULATORY_CARE_PROVIDER_SITE_OTHER): Payer: PPO | Admitting: Family Medicine

## 2019-05-11 ENCOUNTER — Other Ambulatory Visit: Payer: Self-pay

## 2019-05-11 VITALS — BP 158/68 | HR 68 | Temp 96.9°F | Wt 148.8 lb

## 2019-05-11 DIAGNOSIS — I4510 Unspecified right bundle-branch block: Secondary | ICD-10-CM | POA: Insufficient documentation

## 2019-05-11 DIAGNOSIS — I491 Atrial premature depolarization: Secondary | ICD-10-CM | POA: Diagnosis not present

## 2019-05-11 DIAGNOSIS — I10 Essential (primary) hypertension: Secondary | ICD-10-CM

## 2019-05-11 DIAGNOSIS — I456 Pre-excitation syndrome: Secondary | ICD-10-CM

## 2019-05-11 DIAGNOSIS — E1165 Type 2 diabetes mellitus with hyperglycemia: Secondary | ICD-10-CM

## 2019-05-11 DIAGNOSIS — E039 Hypothyroidism, unspecified: Secondary | ICD-10-CM | POA: Diagnosis not present

## 2019-05-11 DIAGNOSIS — I700 Atherosclerosis of aorta: Secondary | ICD-10-CM | POA: Diagnosis not present

## 2019-05-11 LAB — POCT GLYCOSYLATED HEMOGLOBIN (HGB A1C)
Est. average glucose Bld gHb Est-mCnc: 166
Hemoglobin A1C: 7.4 % — AB (ref 4.0–5.6)

## 2019-05-12 LAB — COMPREHENSIVE METABOLIC PANEL
ALT: 14 IU/L (ref 0–32)
AST: 16 IU/L (ref 0–40)
Albumin/Globulin Ratio: 1.6 (ref 1.2–2.2)
Albumin: 4.4 g/dL (ref 3.7–4.7)
Alkaline Phosphatase: 80 IU/L (ref 39–117)
BUN/Creatinine Ratio: 14 (ref 12–28)
BUN: 10 mg/dL (ref 8–27)
Bilirubin Total: 0.4 mg/dL (ref 0.0–1.2)
CO2: 24 mmol/L (ref 20–29)
Calcium: 9.7 mg/dL (ref 8.7–10.3)
Chloride: 97 mmol/L (ref 96–106)
Creatinine, Ser: 0.69 mg/dL (ref 0.57–1.00)
GFR calc Af Amer: 96 mL/min/{1.73_m2} (ref >59)
GFR calc non Af Amer: 84 mL/min/{1.73_m2} (ref >59)
Globulin, Total: 2.7 g/dL (ref 1.5–4.5)
Glucose: 125 mg/dL — ABNORMAL HIGH (ref 65–99)
Potassium: 3.7 mmol/L (ref 3.5–5.2)
Sodium: 139 mmol/L (ref 134–144)
Total Protein: 7.1 g/dL (ref 6.0–8.5)

## 2019-05-12 LAB — LIPID PANEL
Chol/HDL Ratio: 2.9 ratio (ref 0.0–4.4)
Cholesterol, Total: 142 mg/dL (ref 100–199)
HDL: 49 mg/dL (ref >39)
LDL Chol Calc (NIH): 64 mg/dL (ref 0–99)
Triglycerides: 171 mg/dL — ABNORMAL HIGH (ref 0–149)
VLDL Cholesterol Cal: 29 mg/dL (ref 5–40)

## 2019-05-12 LAB — TSH: TSH: 0.536 u[IU]/mL (ref 0.450–4.500)

## 2019-05-14 ENCOUNTER — Telehealth: Payer: Self-pay

## 2019-05-14 MED ORDER — LISINOPRIL-HYDROCHLOROTHIAZIDE 20-12.5 MG PO TABS: 1.0000 | ORAL_TABLET | Freq: Every day | ORAL | 1 refills | Status: DC

## 2019-05-14 NOTE — Telephone Encounter (Signed)
Patient advised. Follow up scheduled.  

## 2019-05-14 NOTE — Telephone Encounter (Signed)
-----   Message from Birdie Sons, MD sent at 05/12/2019  6:43 PM EDT ----- Labs good, increase lisinopril-hctz to 20-12.5 #90 rf x 1 for better control of blood pressure. Schedule follow up for BP check in about 6 weeks.

## 2019-05-31 DIAGNOSIS — I10 Essential (primary) hypertension: Secondary | ICD-10-CM | POA: Diagnosis not present

## 2019-05-31 DIAGNOSIS — I700 Atherosclerosis of aorta: Secondary | ICD-10-CM | POA: Diagnosis not present

## 2019-05-31 DIAGNOSIS — I4510 Unspecified right bundle-branch block: Secondary | ICD-10-CM | POA: Diagnosis not present

## 2019-05-31 DIAGNOSIS — I456 Pre-excitation syndrome: Secondary | ICD-10-CM | POA: Diagnosis not present

## 2019-05-31 DIAGNOSIS — I2510 Atherosclerotic heart disease of native coronary artery without angina pectoris: Secondary | ICD-10-CM | POA: Diagnosis not present

## 2019-05-31 DIAGNOSIS — I491 Atrial premature depolarization: Secondary | ICD-10-CM | POA: Diagnosis not present

## 2019-06-05 ENCOUNTER — Other Ambulatory Visit: Payer: Self-pay

## 2019-06-05 ENCOUNTER — Other Ambulatory Visit (HOSPITAL_COMMUNITY)
Admission: RE | Admit: 2019-06-05 | Discharge: 2019-06-05 | Disposition: A | Discharge status: Home / Self Care | Payer: PPO | Source: Ambulatory Visit | Attending: Obstetrics & Gynecology | Admitting: Obstetrics & Gynecology

## 2019-06-05 ENCOUNTER — Ambulatory Visit (INDEPENDENT_AMBULATORY_CARE_PROVIDER_SITE_OTHER): Payer: PPO | Admitting: Obstetrics & Gynecology

## 2019-06-05 ENCOUNTER — Encounter: Payer: Self-pay | Admitting: Obstetrics & Gynecology

## 2019-06-05 VITALS — BP 140/80 | Ht 63.0 in | Wt 149.0 lb

## 2019-06-05 DIAGNOSIS — N814 Uterovaginal prolapse, unspecified: Secondary | ICD-10-CM

## 2019-06-05 DIAGNOSIS — N841 Polyp of cervix uteri: Secondary | ICD-10-CM | POA: Diagnosis not present

## 2019-06-05 DIAGNOSIS — N813 Complete uterovaginal prolapse: Secondary | ICD-10-CM | POA: Diagnosis not present

## 2019-06-05 NOTE — Progress Notes (Signed)
HPI:      April Pittman is a 78 y.o. [id number] who presents today for her pessary follow up and examination related to her pelvic floor weakening.  Pt reports tolerating the pessary well with no vaginal bleeding and no vaginal discharge.  Symptoms of pelvic floor weakening have greatly improved. She is voiding and defecating without difficulty. She currently has a Gell Horn #3 pessary.  She no longer likes to take out or replace herself.  Feels sx's rather quickly return and does not feel she replaces it correctly.  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 Also,  has a past surgical history that includes Tonsillectomy; Knee surgery; Cardiac catheterization (10/2010); Colonoscopy (03/2008); Breast excisional biopsy (Right); Cataract extraction w/PHACO (Right, 07/28/2016); Cataract extraction w/PHACO (Left, 10/27/2016); Esophagogastroduodenoscopy (egd) with propofol (N/A, 05/12/2018); and Colonoscopy with propofol (N/A, 05/12/2018)., family history includes Diabetes in her father; Heart disease in her paternal grandmother and sister; Lung cancer in her brother; Rheumatic fever in her sister.,  reports that she quit smoking about 31 years ago. She has never used smokeless tobacco. She reports that she does not drink alcohol or use drugs.  She has a current medication list which includes the following prescription(s): amlodipine, aspirin ec, calcium  carbonate, clopidogrel, estradiol, levothyroxine, lisinopril-hydrochlorothiazide, metformin, metoprolol tartrate, multiple vitamins-minerals, nitroglycerin, onetouch ultra, trimo-san, pantoprazole, simvastatin, triamcinolone cream, and vitamin b-12. Also, has No Known Allergies.  Review of Systems  All other systems reviewed and are negative.   Objective: BP 140/80   Ht 5\' 3"  (1.6 m)   Wt 149 lb (67.6 kg)   BMI 26.39 kg/m  Physical Exam Constitutional:      General: She is not in acute distress.    Appearance: She is well-developed.  Genitourinary:     Pelvic exam was performed with patient supine.     Urethra, bladder, vagina and uterus normal.     No vaginal erythema or bleeding.     No cervical motion tenderness, discharge, polyp or nabothian cyst.        Uterus is mobile.     Uterus is not enlarged.     No uterine mass detected.    Uterus is midaxial.     No right or left adnexal mass present.     Right adnexa not tender.     Left adnexa not tender.     Genitourinary Comments: Cystocele present Prolapse of uterus to Gr 3 1 cm cervical polyp noted  HENT:     Head: Normocephalic and atraumatic.     Nose: Nose normal.  Abdominal:     General: There is no distension.     Palpations: Abdomen is soft.     Tenderness: There is no abdominal tenderness.  Musculoskeletal:        General: Normal range of motion.  Neurological:     Mental Status: She is alert and oriented to person, place, and time.     Cranial Nerves: No cranial nerve deficit.  Skin:    General: Skin is warm and dry.  Psychiatric:  Attention and Perception: Attention normal.        Mood and Affect: Mood and affect normal.        Speech: Speech normal.        Behavior: Behavior normal.        Thought Content: Thought content normal.        Judgment: Judgment normal.     Pessary Care Pessary removed and cleaned.  Vagina checked - without erosions - pessary replaced.  A/P:1. Procidentia of  uterus 2. Cystocele with prolapse Pessary was cleaned and replaced today. Instructions given for care. Concerning symptoms to observe for are counseled to patient. Follow up scheduled for 4 months.  3. Cervical polyp See below  A total of 20 minutes were spent face-to-face with the patient as well as preparation, review, communication, and documentation during this encounter.   April Applebaum, MD, [facility], [facility] 06/05/2019  1:56 PM  Also: Polyp noted on exam of cervix  Cervix visualized and polyp noted.  Ring forcep applied to cervix and twisting motion removed polyp intact.  Hemostasis obtained.  April Applebaum, MD, [facility], [facility] 06/05/2019  1:57 PM

## 2019-06-06 ENCOUNTER — Ambulatory Visit: Payer: PPO | Admitting: Obstetrics & Gynecology

## 2019-06-07 LAB — SURGICAL PATHOLOGY

## 2019-06-07 NOTE — Progress Notes (Signed)
Let her know cervical polyp benign (no cancer).

## 2019-06-08 NOTE — Progress Notes (Signed)
Left voice message to advise pt of normal polyp

## 2019-06-22 NOTE — Progress Notes (Signed)
I,Roshena L Chambers,acting as a scribe for [name], MD.,have documented all relevant documentation on the behalf of [name], MD,as directed by  [name], MD while in the presence of [name], MD.  Established patient visit   Patient: April Pittman   DOB: 1940/06/19   79 y.o. Female  MRN: [id number] Visit Date: 06/25/2019  Today's healthcare provider: [name], MD   Chief Complaint  Patient presents with  . Hypertension   Subjective    HPI Hypertension, follow-up  BP Readings from Last 3 Encounters:  06/05/19 140/80  05/11/19 (!) 158/68  02/13/19 131/76   Wt Readings from Last 3 Encounters:  06/05/19 149 lb (67.6 kg)  05/11/19 148 lb 12.8 oz (67.5 kg)  02/13/19 148 lb (67.1 kg)     She was last seen for hypertension 6 weeks ago.  BP at that visit was 158/68. Management since that visit includes increasing lisinopril-hctz to 20-12.5 for better control of blood pressure. Patient states her Cardiologist Dr. Ubaldo Glassing restarted her on Amlodipine 5mg  daily since then  She reports good compliance with treatment. She is not having side effects.  She is following a Regular diet. She is not exercising. She does not smoke.  Use of agents associated with hypertension: NSAIDS.   Outside blood pressures are checked occasionally. Symptoms: No chest pain No chest pressure No palpitations No dyspnea No orthopnea No paroxysmal nocturnal dyspnea No lower extremity edema No syncope   Pertinent labs: Lab Results  Component Value Date   CHOL 142 05/11/2019   HDL 49 05/11/2019   LDLCALC 64 05/11/2019   LDLDIRECT 73 05/11/2017   TRIG 171 (H) 05/11/2019   CHOLHDL 2.9 05/11/2019   Lab Results  Component Value Date   NA 139 05/11/2019   K 3.7 05/11/2019   CO2 24 05/11/2019   GLUCOSE 125 (H) 05/11/2019   BUN 10 05/11/2019   CREATININE 0.69 05/11/2019   CALCIUM 9.7 05/11/2019   GFRNONAA 84 05/11/2019   GFRAA 96 05/11/2019     The 10-year  ASCVD risk score (Goff DC Jr., et al., 2013) is: 53.5%   ---------------------------------------------------------------------------------------------------  She also states that she gets short of breath easily with minimal exertion for several months Lab Results  Component Value Date   WBC 7.4 02/12/2019   HGB 12.9 02/12/2019   HCT 39.0 02/12/2019   MCV 83.9 02/12/2019   PLT 330 02/12/2019        Medications: Outpatient Medications Prior to Visit  Medication Sig  . amLODipine (NORVASC) 5 MG tablet Take by mouth.  Marland Kitchen aspirin EC 81 MG tablet Take 81 mg by mouth daily.  . calcium carbonate (OS-CAL) 1250 (500 Ca) MG chewable tablet Chew 1 tablet by mouth daily.   . clopidogrel (PLAVIX) 75 MG tablet Take 75 mg by mouth daily.  Marland Kitchen estradiol (ESTRACE) 0.1 MG/GM vaginal cream Place 1 Applicatorful vaginally once a week.  . levothyroxine (SYNTHROID) 125 MCG tablet TAKE 1 TABLET BY MOUTH IN THE MORNING ON AN EMPTY STOMACH FOR THYROID  . lisinopril-hydrochlorothiazide (ZESTORETIC) 20-12.5 MG tablet Take 1 tablet by mouth daily.  . metFORMIN (GLUCOPHAGE) 1000 MG tablet Take 1 tablet (1,000 mg total) by mouth 2 (two) times daily. Take with food  . metoprolol (LOPRESSOR) 50 MG tablet Take 50 mg by mouth 2 (two) times daily.   . nitroGLYCERIN (NITROSTAT) 0.4 MG SL tablet Place 1 tablet (0.4 mg total) under the tongue every 5 (five) minutes as needed for chest pain.  Marland Kitchen  ONETOUCH ULTRA test strip USE TO CHECK BLOOD SUGAR DAILY. E 11.9 (TYPE 2 DIABETES MELLITIS)  . OXYQUINOLONE SULFATE VAGINAL (TRIMO-SAN) 0.025 % GEL Place 1 Applicatorful vaginally every 30 (thirty) days.  . pantoprazole (PROTONIX) 40 MG tablet Take by mouth.  . simvastatin (ZOCOR) 40 MG tablet TAKE 1 TABLET BY MOUTH EVERY DAY FOR CHOLESTEROL  . triamcinolone cream (KENALOG) 0.1 % Apply 1 application topically 2 (two) times daily.  . vitamin B-12 (CYANOCOBALAMIN) 1000 MCG tablet Take 1,000 mcg by mouth daily.   . [DISCONTINUED]  Multiple Vitamins-Minerals (PRESERVISION AREDS 2 PO) Take 2 capsules by mouth daily.   No facility-administered medications prior to visit.    Review of Systems  Constitutional: Negative for appetite change, chills, fatigue and fever.  Respiratory: Negative for chest tightness and shortness of breath.   Cardiovascular: Negative for chest pain and palpitations.  Gastrointestinal: Negative for abdominal pain, nausea and vomiting.  Neurological: Negative for dizziness and weakness.      Objective    BP 122/62 (BP Location: Left Arm, Patient Position: Sitting, Cuff Size: Normal)   Pulse 67   Temp (!) 96.9 F (36.1 C) (Temporal)   Resp 16   Wt 149 lb (67.6 kg)   SpO2 98% Comment: room air  BMI 26.39 kg/m    Physical Exam   General: Appearance:     Overweight female in no acute distress  Eyes:    PERRL, conjunctiva/corneas clear, EOM's intact       Lungs:     Clear to auscultation bilaterally, respirations unlabored  Heart:    Normal heart rate. Normal rhythm. No murmurs, rubs, or gallops.   MS:   All extremities are intact.   Neurologic:   Awake, alert, oriented x 3. No apparent focal neurological           defect.         No results found for any visits on 06/25/19.  Assessment & Plan    1. Essential hypertension Much better with increase dose of lisinopril-hctz and restarting amlodipine. Continue current medications.     Return in about 3 months (around 09/25/2019) for Diabetes.      [name], MD  [facility] [phone] (phone) [phone] (fax)  [place]

## 2019-06-25 ENCOUNTER — Ambulatory Visit (INDEPENDENT_AMBULATORY_CARE_PROVIDER_SITE_OTHER): Payer: PPO | Admitting: Family Medicine

## 2019-06-25 ENCOUNTER — Encounter: Payer: Self-pay | Admitting: Family Medicine

## 2019-06-25 ENCOUNTER — Other Ambulatory Visit: Payer: Self-pay

## 2019-06-25 VITALS — BP 122/62 | HR 67 | Temp 96.9°F | Resp 16 | Wt 149.0 lb

## 2019-06-25 DIAGNOSIS — I10 Essential (primary) hypertension: Secondary | ICD-10-CM | POA: Diagnosis not present

## 2019-06-25 NOTE — Patient Instructions (Signed)
.   Please review the attached list of medications and notify my office if there are any errors.   . Please bring all of your medications to every appointment so we can make sure that our medication list is the same as yours.   

## 2019-07-10 DIAGNOSIS — H353211 Exudative age-related macular degeneration, right eye, with active choroidal neovascularization: Secondary | ICD-10-CM | POA: Diagnosis not present

## 2019-07-10 DIAGNOSIS — H353221 Exudative age-related macular degeneration, left eye, with active choroidal neovascularization: Secondary | ICD-10-CM | POA: Diagnosis not present

## 2019-08-02 ENCOUNTER — Other Ambulatory Visit: Payer: Self-pay | Admitting: Family Medicine

## 2019-08-16 ENCOUNTER — Telehealth: Payer: Self-pay

## 2019-08-16 NOTE — Telephone Encounter (Signed)
Copied from [place] [id number]. Topic: General - Other >> Aug 16, 2019 10:24 AM Leward Quan A wrote: Reason for CRM: Patient called to ask Dr Caryn Section nurse to give her a call to discuss her medication also to possibly cancel her appointment for 08/20/19 since she is feeling of and does not need to be seen. Patient would like a call back today or tomorrow please. Ph# [phone]

## 2019-08-16 NOTE — Telephone Encounter (Signed)
Patient wants to know if Dr. Caryn Section is willing to manage her GERD and provide refills for Protonix. Per patient, she was initially prescribed this medication by Dr. Reginia Forts. She says she was in the hospital years ago and had to follow up with GI, and the GI specialist took over prescribing the mediation. Patient doesn't want to have to make an appointment with GI just to get this one medication refilled. Patient wants to know if Dr. Caryn Section will prescribe it? She doesn't need a refill at this time, but wants to know for future refills. Please advise.

## 2019-08-17 NOTE — Telephone Encounter (Signed)
Patient advised.

## 2019-08-17 NOTE — Telephone Encounter (Signed)
Yes, I can refill it when it is due to be refilled

## 2019-09-18 ENCOUNTER — Other Ambulatory Visit: Payer: Self-pay

## 2019-10-03 ENCOUNTER — Other Ambulatory Visit: Payer: Self-pay

## 2019-10-03 ENCOUNTER — Ambulatory Visit: Payer: PPO | Admitting: Obstetrics & Gynecology

## 2019-10-03 ENCOUNTER — Encounter: Payer: Self-pay | Admitting: Obstetrics & Gynecology

## 2019-10-03 VITALS — BP 130/80 | Ht 63.5 in | Wt 148.0 lb

## 2019-10-03 DIAGNOSIS — N814 Uterovaginal prolapse, unspecified: Secondary | ICD-10-CM

## 2019-10-03 DIAGNOSIS — N813 Complete uterovaginal prolapse: Secondary | ICD-10-CM | POA: Diagnosis not present

## 2019-10-03 NOTE — Progress Notes (Signed)
HPI:      [name] is a 79 y.o. [id number] who presents today for her pessary follow up and examination related to her pelvic floor weakening.  Pt reports tolerating the pessary well with  no vaginal bleeding and  no vaginal discharge.  Symptoms of pelvic floor weakening have greatly improved. She is voiding and defecating without difficulty. She currently has a GELLHORN #3 pessary.  PMHx: She  has a past medical history of Alopecia, Anginal pain ([place]), Benign neoplasm of large bowel, CAD (coronary artery disease), Carpal tunnel syndrome, Complication of anesthesia, DM (diabetes mellitus) ([place]), Erosion of cervix, GERD (gastroesophageal reflux disease), H/O adenomatous polyp of colon, History of chicken pox, Hypercholesteremia, Hypertension, Hypothyroid, Irritable bowel, Malignant melanoma of foot ([place]), Osteoporosis, PONV (postoperative nausea and vomiting), Post-menopausal bleeding, Procidentia of uterus, Rectal bleeding, Sepsis ([place]) (02/18/2018), and TIA (transient ischemic attack). Also,  has a past surgical history that includes Tonsillectomy; Knee surgery; Cardiac catheterization (10/2010); Colonoscopy (03/2008); Breast excisional biopsy (Right); Cataract extraction w/PHACO (Right, 07/28/2016); Cataract extraction w/PHACO (Left, 10/27/2016); Esophagogastroduodenoscopy (egd) with propofol (N/A, 05/12/2018); and Colonoscopy with propofol (N/A, 05/12/2018)., family history includes Diabetes in her father; Heart disease in her paternal grandmother and sister; Lung cancer in her brother; Rheumatic fever in her sister.,  reports that she quit smoking about 31 years ago. She has never used smokeless tobacco. She reports that she does not drink alcohol and does not use drugs.  She has a current medication list which includes the following prescription(s): amlodipine, aspirin ec, calcium carbonate, clopidogrel, estradiol, levothyroxine, lisinopril-hydrochlorothiazide, metformin, metoprolol tartrate,  nitroglycerin, onetouch ultra, trimo-san, pantoprazole, simvastatin, triamcinolone cream, and vitamin b-12. Also, has No Known Allergies.  Review of Systems  All other systems reviewed and are negative.   Objective: BP 130/80   Ht 5' 3.5" (1.613 m)   Wt 148 lb (67.1 kg)   BMI 25.81 kg/m  Physical Exam Constitutional:      General: She is not in acute distress.    Appearance: She is well-developed.  Genitourinary:     Pelvic exam was performed with patient supine.     Urethra, bladder, vagina and uterus normal.     No vaginal erythema or bleeding.     No cervical motion tenderness, discharge, polyp or nabothian cyst.     Uterus is mobile.     Uterus is not enlarged.     No uterine mass detected.    Uterus is midaxial.     No right or left adnexal mass present.     Right adnexa not tender.     Left adnexa not tender.     Genitourinary Comments: Cystocele present Prolapse of uterus to Gr 3  HENT:     Head: Normocephalic and atraumatic.     Nose: Nose normal.  Abdominal:     General: There is no distension.     Palpations: Abdomen is soft.     Tenderness: There is no abdominal tenderness.  Musculoskeletal:        General: Normal range of motion.  Neurological:     Mental Status: She is alert and oriented to person, place, and time.     Cranial Nerves: No cranial nerve deficit.  Skin:    General: Skin is warm and dry.  Psychiatric:        Attention and Perception: Attention normal.        Mood and Affect: Mood and affect normal.        Speech: Speech normal.  Behavior: Behavior normal.        Thought Content: Thought content normal.        Judgment: Judgment normal.     Pessary Care Pessary removed and cleaned.  Vagina checked - without erosions - pessary replaced.  A/P:   ICD-10-CM   1. Procidentia of uterus  N81.3   2. Cystocele with prolapse  N81.4    Pessary was cleaned and replaced today. Instructions given for care. Concerning symptoms to observe  for are counseled to patient. Follow up scheduled for 3 months.  A total of 20 minutes were spent face-to-face with the patient as well as preparation, review, communication, and documentation during this encounter.   Barnett Applebaum, MD, [facility], [facility] 10/03/2019  1:37 PM

## 2019-10-08 DIAGNOSIS — H353211 Exudative age-related macular degeneration, right eye, with active choroidal neovascularization: Secondary | ICD-10-CM | POA: Diagnosis not present

## 2019-10-08 DIAGNOSIS — H353221 Exudative age-related macular degeneration, left eye, with active choroidal neovascularization: Secondary | ICD-10-CM | POA: Diagnosis not present

## 2019-10-10 ENCOUNTER — Ambulatory Visit: Payer: Self-pay | Admitting: Family Medicine

## 2019-10-18 IMAGING — CR LEFT HAND - COMPLETE 3+ VIEW
1 series · 3 of 3 positions shown · non-contrast
Comparison: None.

CLINICAL DATA: Fall with numbness

EXAM:
LEFT HAND - COMPLETE 3+ VIEW

[Series 1: dg hand complete left · 0.14mm/px · 3 of 3 slices shown]
[im 1/3]
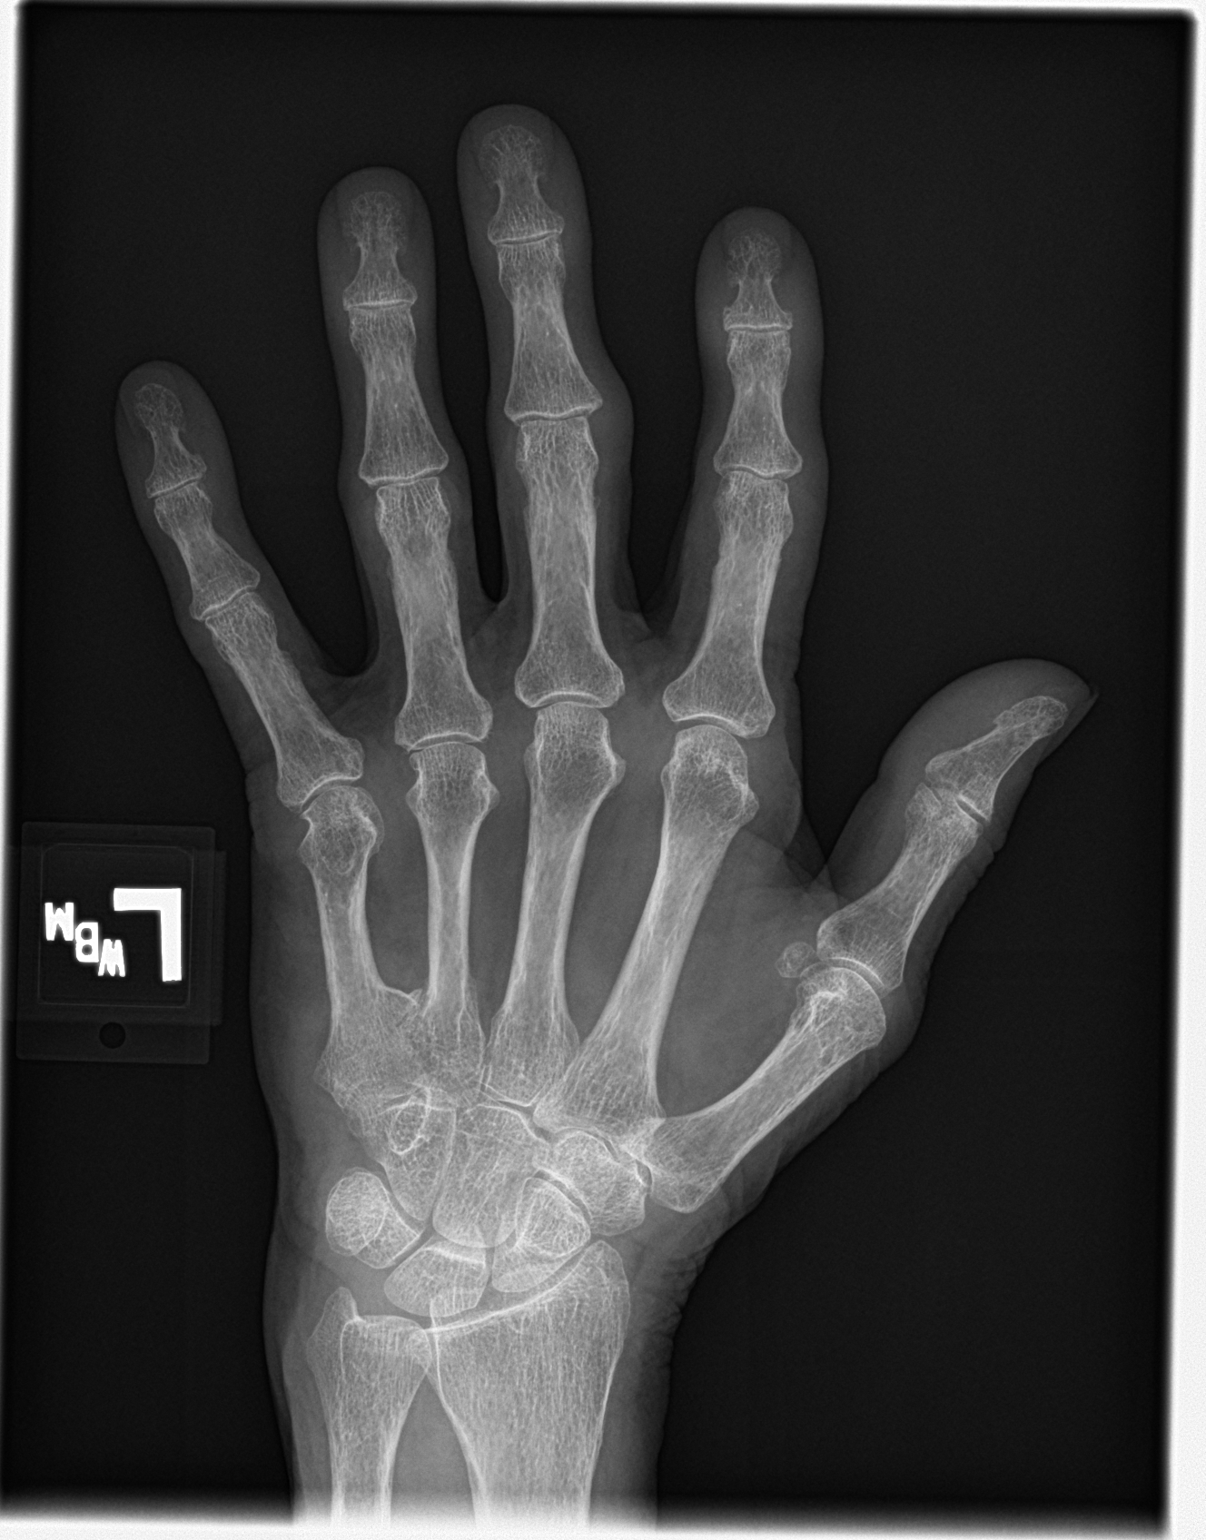
[im 2/3]
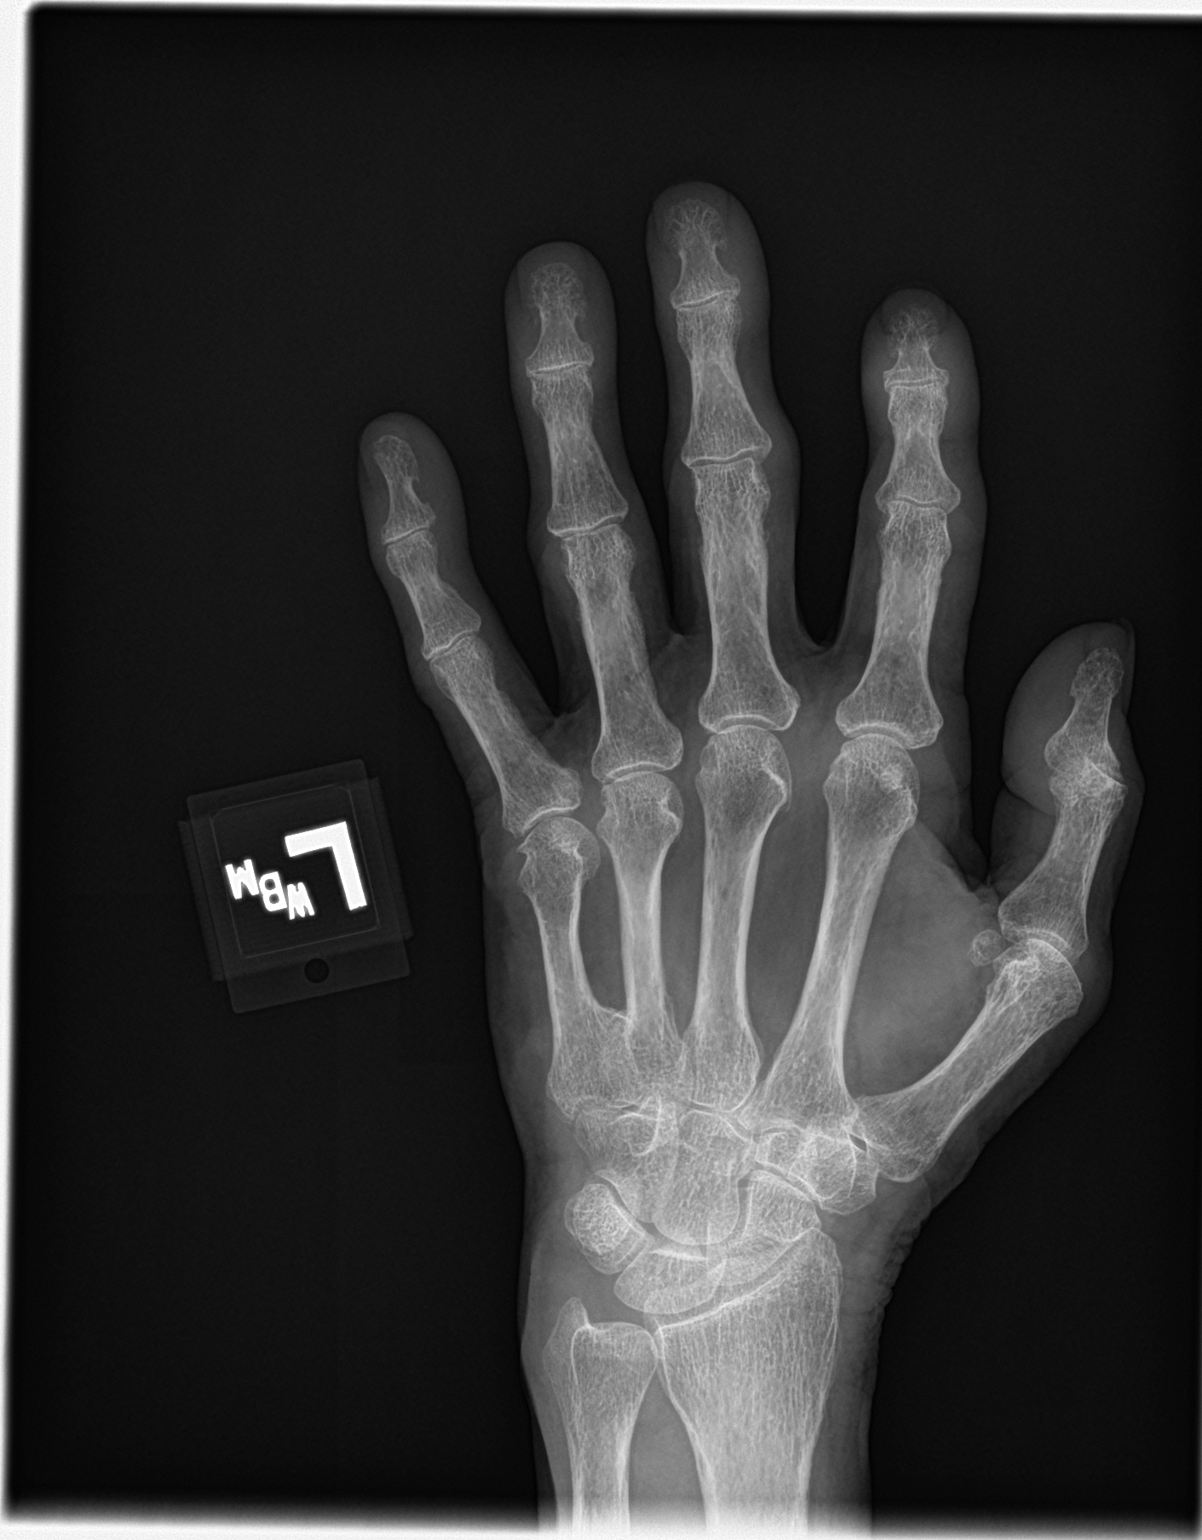
[im 3/3]
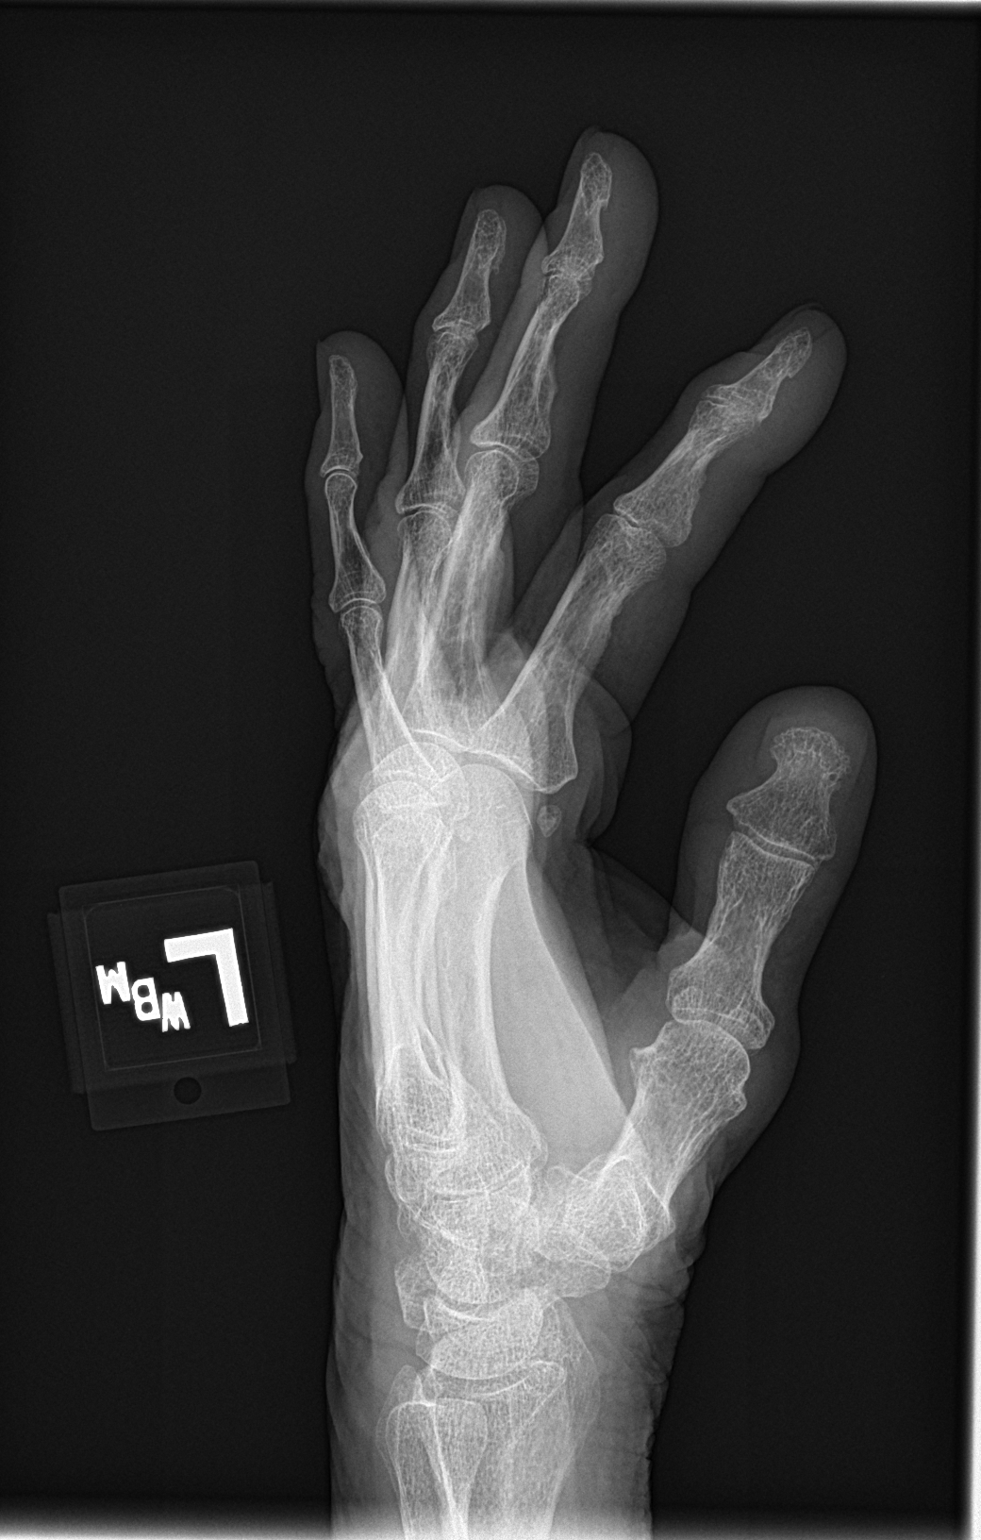

[3 of 3 positions shown; findings below may reference images not displayed]

FINDINGS: Chronic appearing fracture deformities at the bases of the fourth
and fifth metacarpals. Possible acute fracture deformity base of the
fourth metacarpal. No subluxation. Mild degenerative narrowing of
the DIP and second through fourth PIP joints. Mild degenerative
narrowing at the fourth and fifth MCP joints.
IMPRESSION: 1. Possible acute on chronic fracture deformity base of the fourth
metacarpal
2. Chronic appearing fracture deformity base of the fifth metacarpal
3. Digital arthritis

## 2019-10-18 IMAGING — CR RIGHT RIBS - 2 VIEW
1 series · 4 of 4 positions shown · non-contrast
Comparison: 06/08/2018 chest x-ray

CLINICAL DATA: Fall 4 days ago, hit right-sided ribs

EXAM:
RIGHT RIBS - 2 VIEW

[Series 1: dg ribs unilateral right · 0.14mm/px · 4 of 4 slices shown]
[im 1/4]
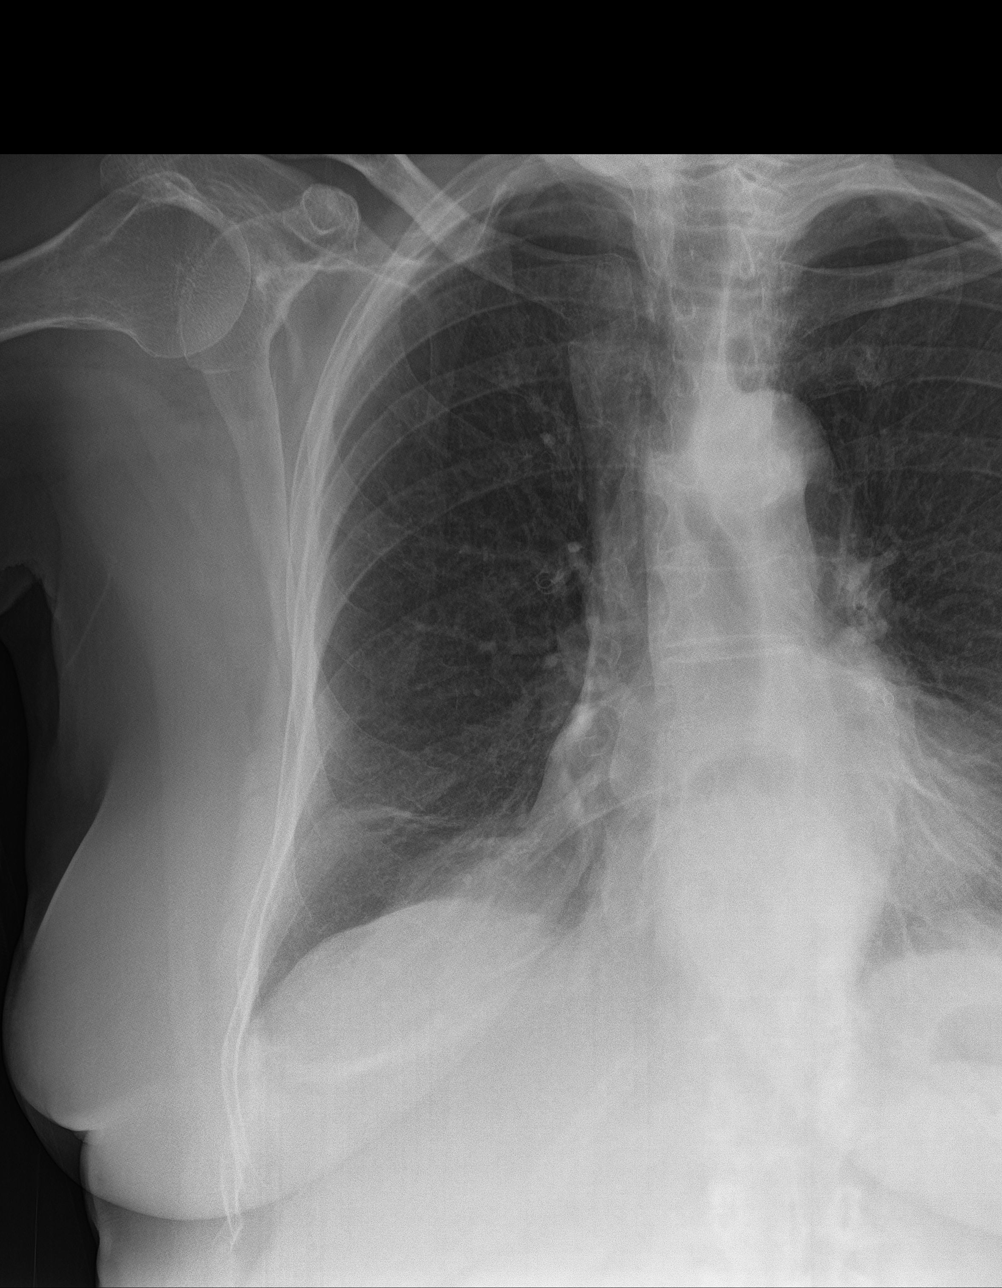
[im 2/4]
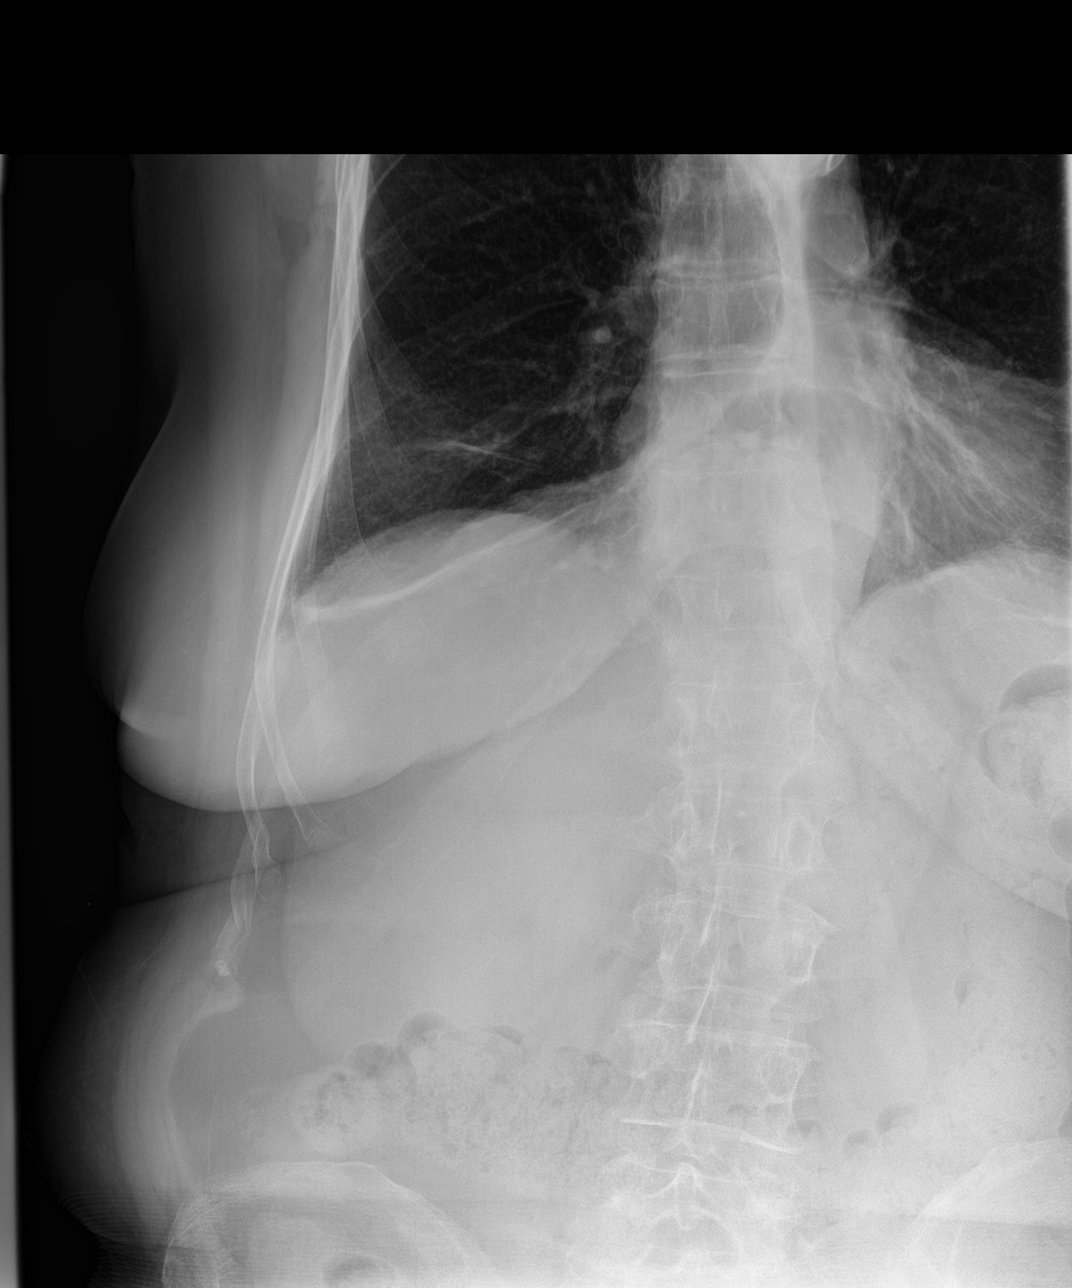
[im 3/4]
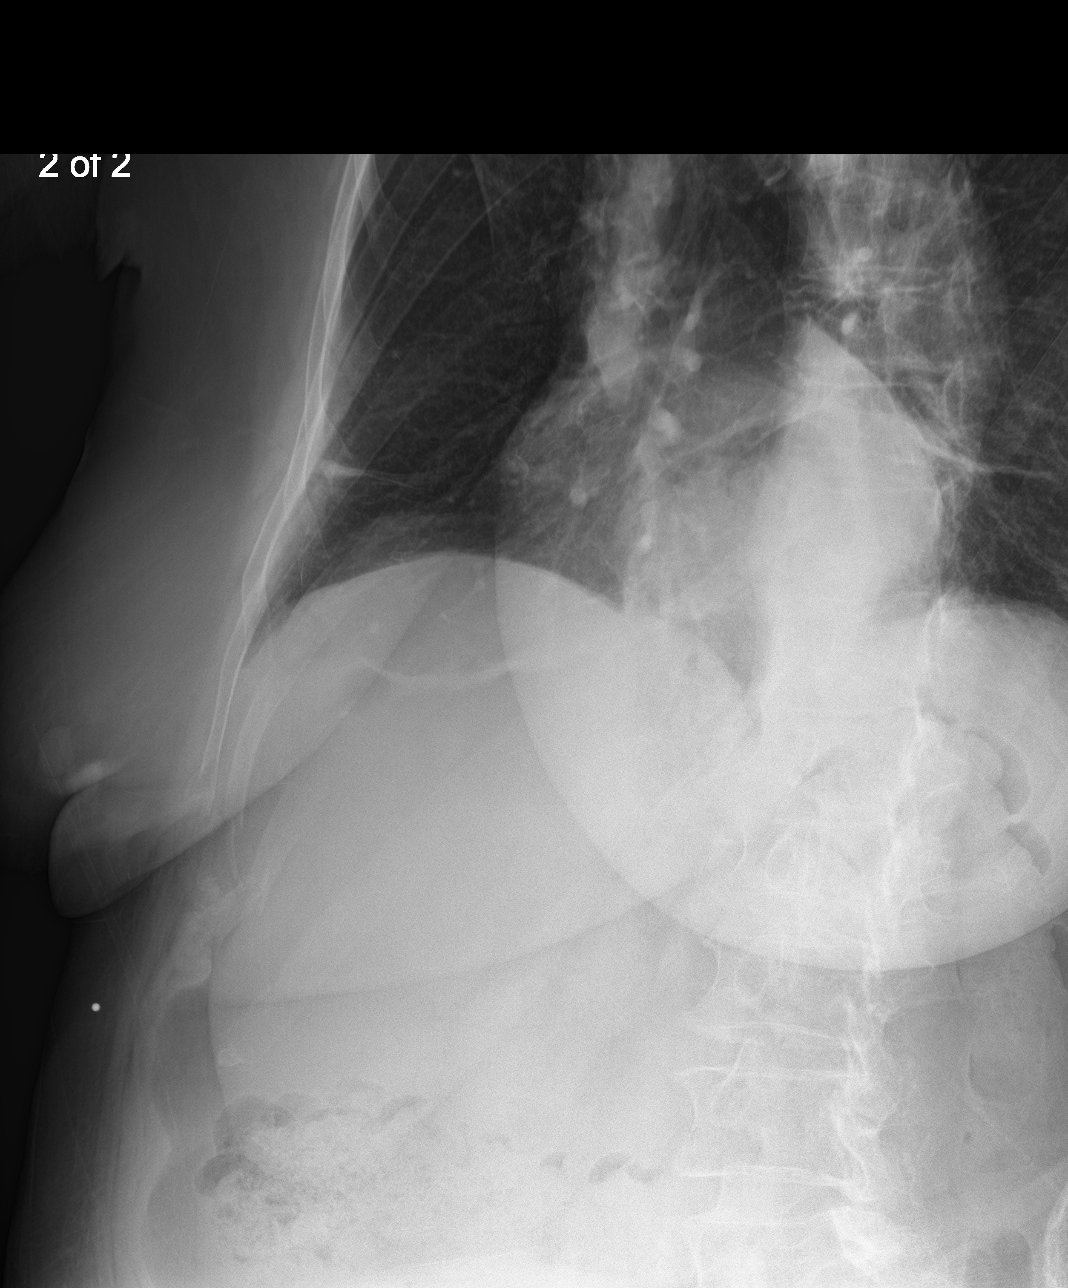
[im 4/4]
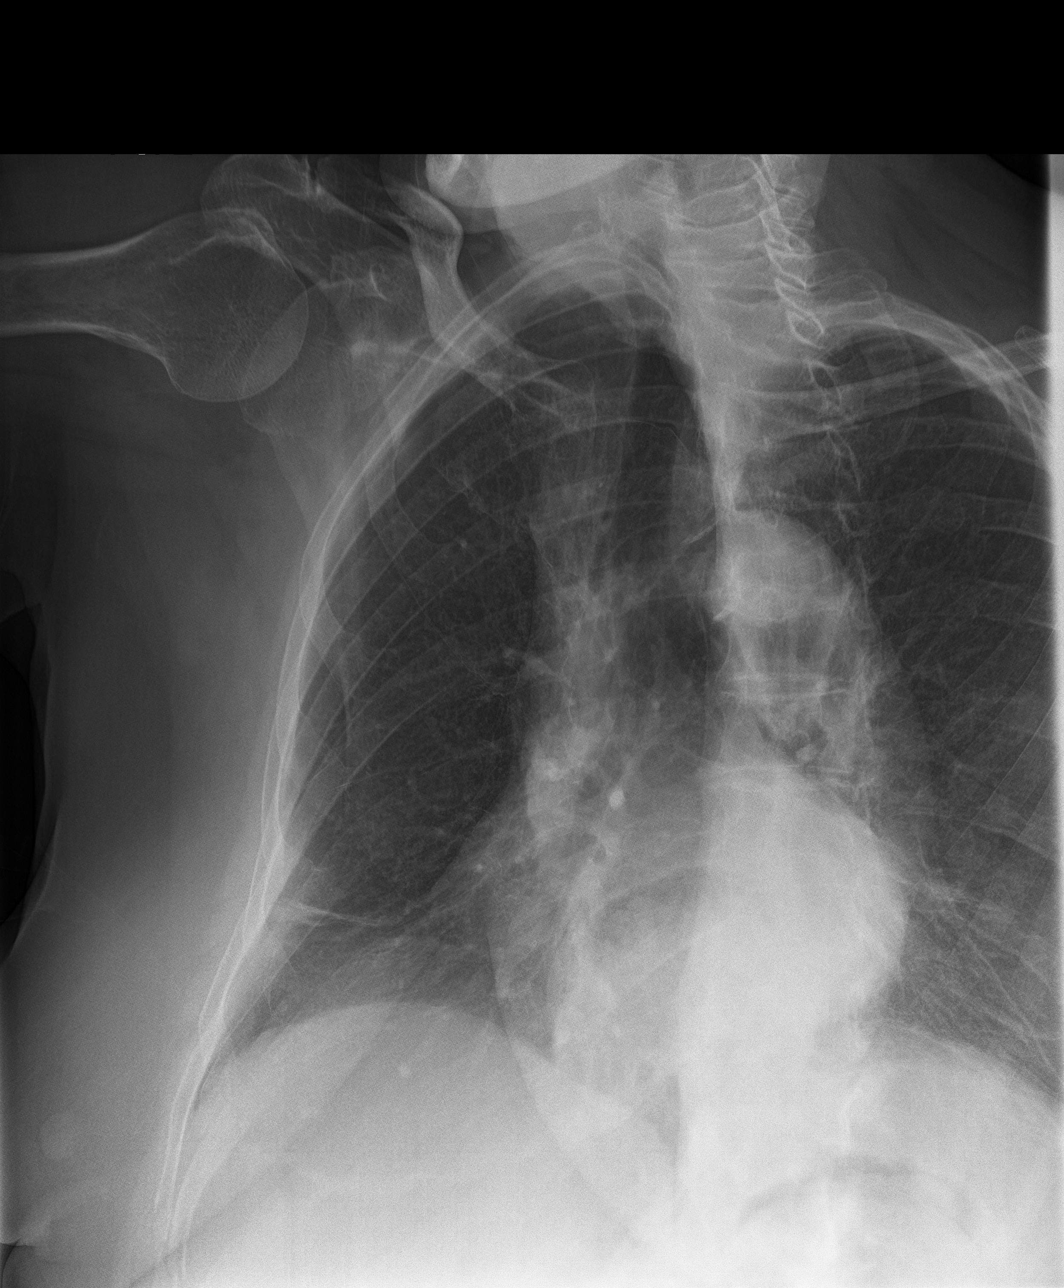

[4 of 4 positions shown; findings below may reference images not displayed]

FINDINGS: Linear atelectasis or scarring at the right base. Negative for right
pneumothorax or pleural effusion. Acute displaced right eighth
through eleventh anterolateral rib fractures.
IMPRESSION: Acute displaced right eighth through eleventh rib fractures. No
obvious pneumothorax. Linear scarring or atelectasis right base

## 2019-10-18 IMAGING — CR LEFT WRIST - COMPLETE 3+ VIEW
1 series · 4 of 4 positions shown · non-contrast
Comparison: None.

CLINICAL DATA: Fall with pain and numbness

EXAM:
LEFT WRIST - COMPLETE 3+ VIEW

[Series 1: dg wrist complete left · 0.14mm/px · 4 of 4 slices shown]
[im 1/4]
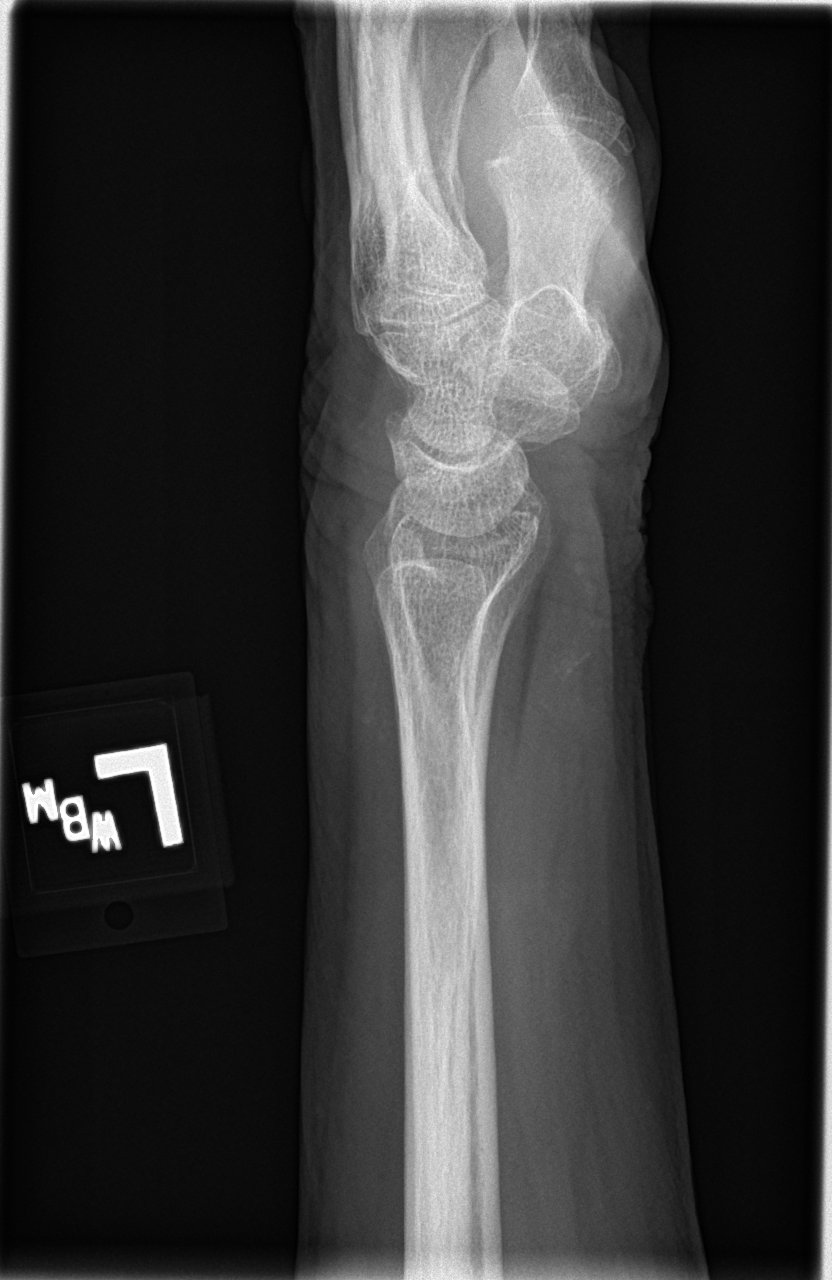
[im 2/4]
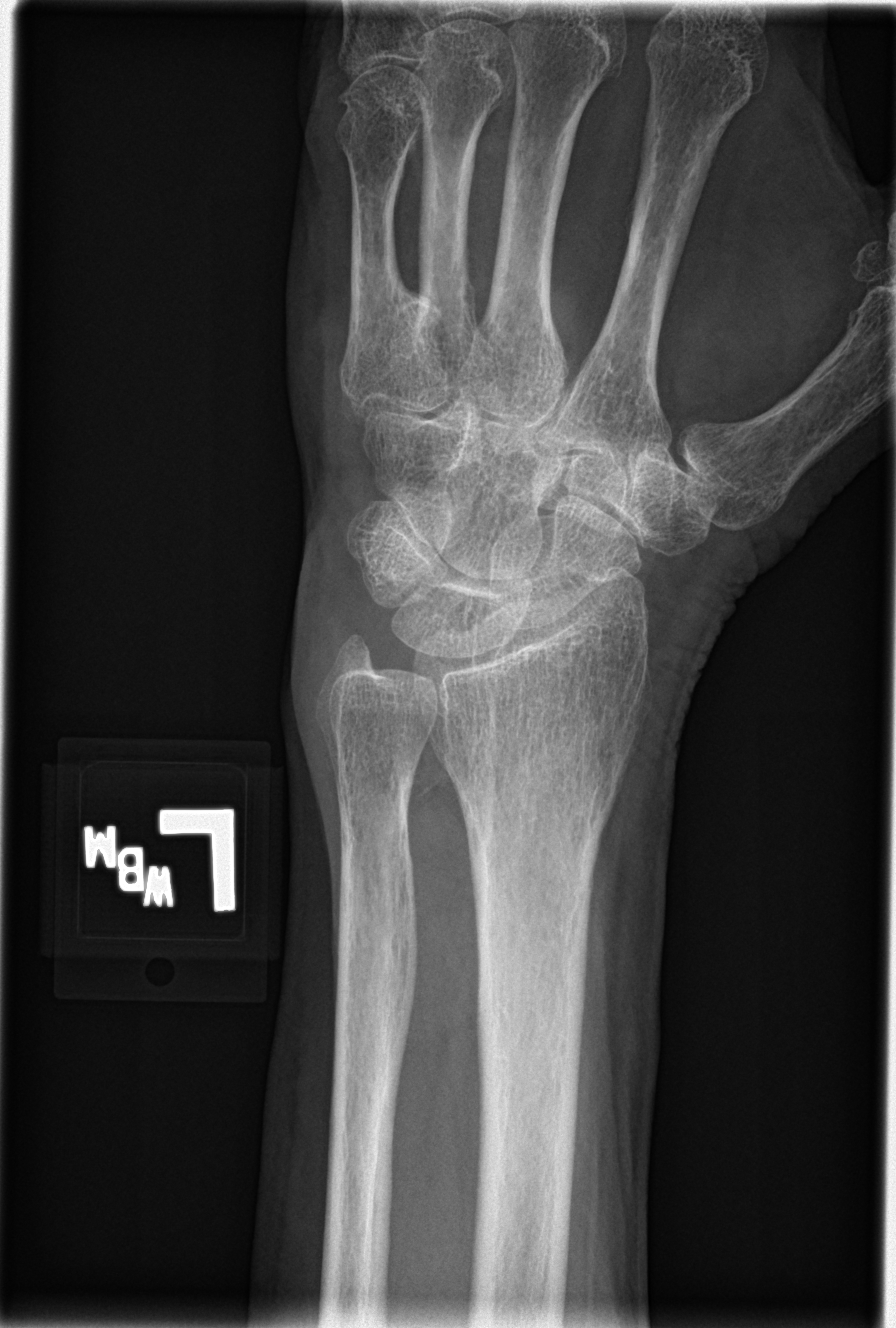
[im 3/4]
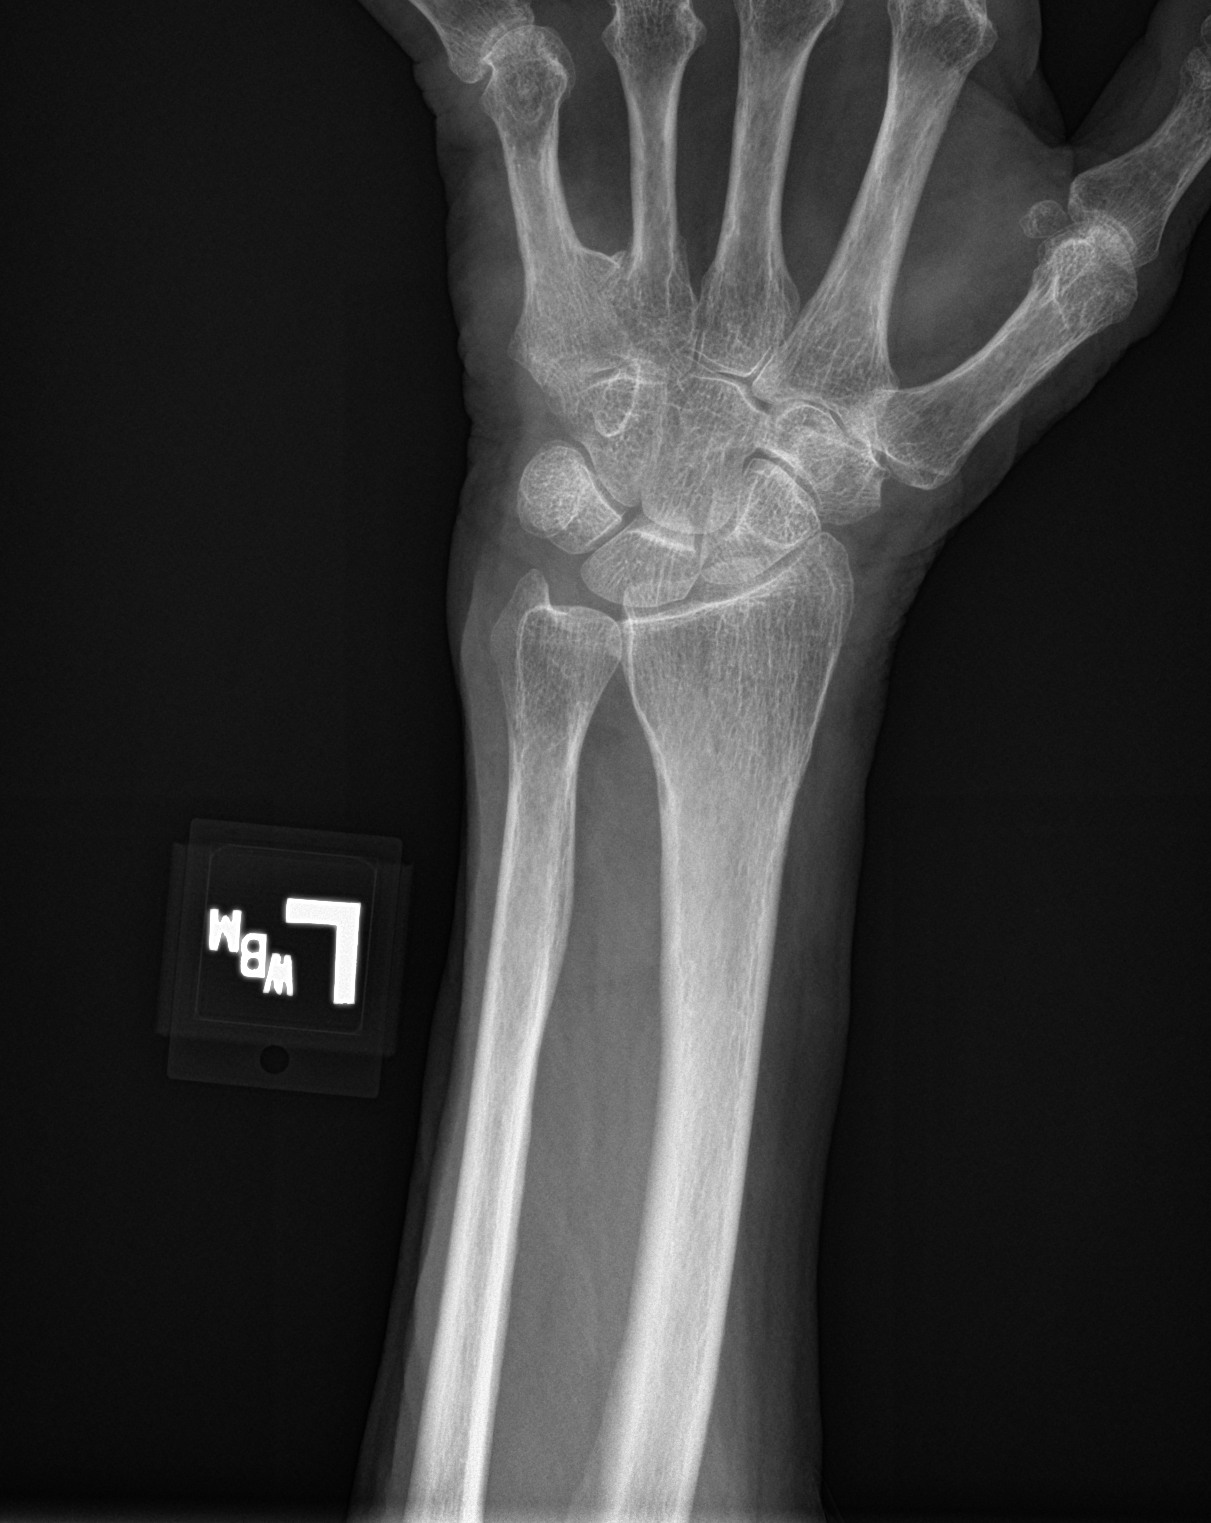
[im 4/4]
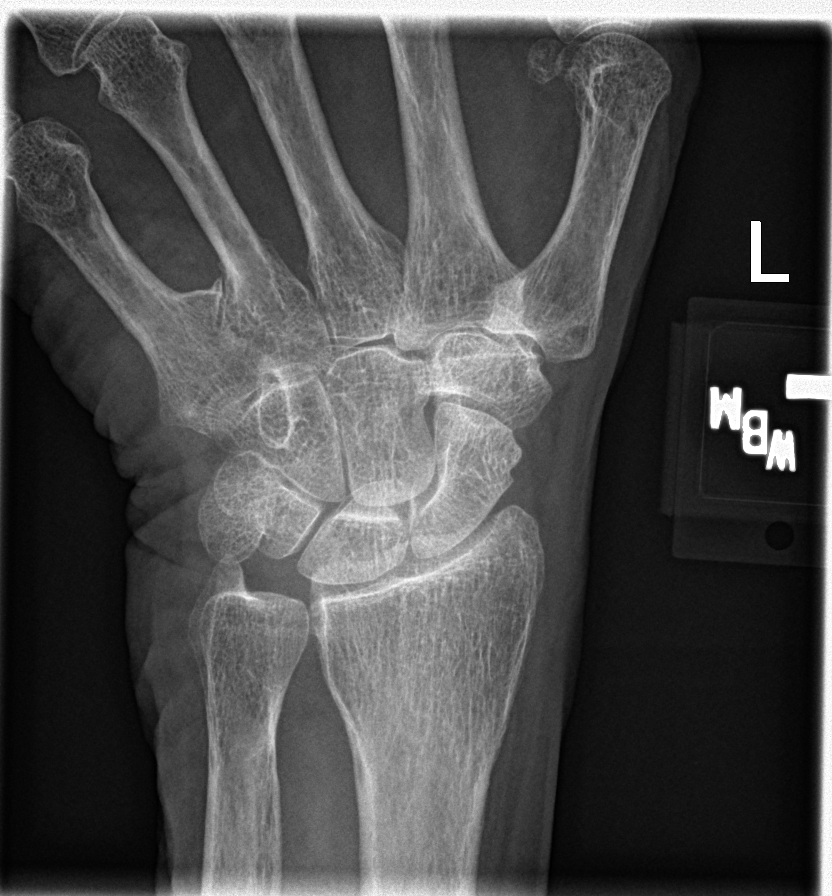

[4 of 4 positions shown; findings below may reference images not displayed]

FINDINGS: No fracture or malalignment at the carpal bones. No radiopaque
foreign body.
IMPRESSION: No acute osseous abnormality. See separately dictated hand
radiograph report.

## 2019-11-07 ENCOUNTER — Other Ambulatory Visit: Payer: Self-pay | Admitting: Family Medicine

## 2019-11-30 ENCOUNTER — Ambulatory Visit: Payer: Self-pay | Admitting: Family Medicine

## 2019-12-06 DIAGNOSIS — E7800 Pure hypercholesterolemia, unspecified: Secondary | ICD-10-CM | POA: Diagnosis not present

## 2019-12-06 DIAGNOSIS — I700 Atherosclerosis of aorta: Secondary | ICD-10-CM | POA: Diagnosis not present

## 2019-12-06 DIAGNOSIS — I2510 Atherosclerotic heart disease of native coronary artery without angina pectoris: Secondary | ICD-10-CM | POA: Diagnosis not present

## 2019-12-06 DIAGNOSIS — I4510 Unspecified right bundle-branch block: Secondary | ICD-10-CM | POA: Diagnosis not present

## 2019-12-06 DIAGNOSIS — I10 Essential (primary) hypertension: Secondary | ICD-10-CM | POA: Diagnosis not present

## 2019-12-06 DIAGNOSIS — I491 Atrial premature depolarization: Secondary | ICD-10-CM | POA: Diagnosis not present

## 2019-12-06 DIAGNOSIS — E119 Type 2 diabetes mellitus without complications: Secondary | ICD-10-CM | POA: Diagnosis not present

## 2019-12-31 DIAGNOSIS — H353211 Exudative age-related macular degeneration, right eye, with active choroidal neovascularization: Secondary | ICD-10-CM | POA: Diagnosis not present

## 2019-12-31 DIAGNOSIS — H02831 Dermatochalasis of right upper eyelid: Secondary | ICD-10-CM | POA: Diagnosis not present

## 2019-12-31 DIAGNOSIS — H353221 Exudative age-related macular degeneration, left eye, with active choroidal neovascularization: Secondary | ICD-10-CM | POA: Diagnosis not present

## 2019-12-31 LAB — HM DIABETES EYE EXAM

## 2020-01-02 DIAGNOSIS — I10 Essential (primary) hypertension: Secondary | ICD-10-CM

## 2020-01-02 DIAGNOSIS — I2510 Atherosclerotic heart disease of native coronary artery without angina pectoris: Secondary | ICD-10-CM

## 2020-01-03 ENCOUNTER — Other Ambulatory Visit: Payer: Self-pay | Admitting: Family Medicine

## 2020-01-04 ENCOUNTER — Other Ambulatory Visit: Payer: Self-pay | Admitting: Family Medicine

## 2020-01-04 DIAGNOSIS — E119 Type 2 diabetes mellitus without complications: Secondary | ICD-10-CM

## 2020-01-04 DIAGNOSIS — E039 Hypothyroidism, unspecified: Secondary | ICD-10-CM

## 2020-01-15 ENCOUNTER — Ambulatory Visit (INDEPENDENT_AMBULATORY_CARE_PROVIDER_SITE_OTHER): Payer: PPO | Admitting: Family Medicine

## 2020-01-15 ENCOUNTER — Encounter: Payer: Self-pay | Admitting: Family Medicine

## 2020-01-15 ENCOUNTER — Other Ambulatory Visit: Payer: Self-pay

## 2020-01-15 VITALS — BP 123/72 | HR 75 | Temp 98.4°F | Resp 16 | Wt 149.0 lb

## 2020-01-15 DIAGNOSIS — E1165 Type 2 diabetes mellitus with hyperglycemia: Secondary | ICD-10-CM

## 2020-01-15 DIAGNOSIS — Z23 Encounter for immunization: Secondary | ICD-10-CM

## 2020-01-15 LAB — POCT GLYCOSYLATED HEMOGLOBIN (HGB A1C)
Est. average glucose Bld gHb Est-mCnc: 192
Hemoglobin A1C: 8.3 % — AB (ref 4.0–5.6)

## 2020-01-15 NOTE — Progress Notes (Signed)
Established patient visit   Patient: April Pittman   DOB: March 11, 1940   79 y.o. Female  MRN: [id number] Visit Date: 01/15/2020  Today's healthcare provider: [name], MD   Chief Complaint  Patient presents with  . Diabetes  . Hypertension   Subjective    HPI  Diabetes Mellitus Type II, follow-up  Lab Results  Component Value Date   HGBA1C 7.4 (A) 05/11/2019   HGBA1C 9.0 (A) 01/05/2019   HGBA1C 8.1 (H) 06/21/2018   Last seen for diabetes 8 months ago.  Management since then includes continuing the same treatment. She reports good compliance with treatment. She is not having side effects.   Home blood sugar records: blood sugars are not checked  Episodes of hypoglycemia? No    Current insulin regiment: none Most Recent Eye Exam: 12/31/2019  She has been eating a lot of Halloween candy for the last month.  --------------------------------------------------------------------------------------------------- Hypertension, follow-up  BP Readings from Last 3 Encounters:  01/15/20 123/72  10/03/19 130/80  06/25/19 122/62   Wt Readings from Last 3 Encounters:  01/15/20 149 lb (67.6 kg)  10/03/19 148 lb (67.1 kg)  06/25/19 149 lb (67.6 kg)     She was last seen for hypertension 6 months ago.  BP at that visit was 122/62. Management since that visit includes; Much better with increase dose of lisinopril-hctz and restarting amlodipine. Continue current medications. She reports good compliance with treatment. She is not having side effects.  She is not exercising. She is adherent to low salt diet.   Outside blood pressures are not checked.  She does not smoke.  Use of agents associated with hypertension: NSAIDS.   ---------------------------------------------------------------------------------------------------      Medications: Outpatient Medications Prior to Visit  Medication Sig  . amLODipine (NORVASC) 5 MG tablet Take by mouth.  Marland Kitchen aspirin EC  81 MG tablet Take 81 mg by mouth daily.  . calcium carbonate (OS-CAL) 1250 (500 Ca) MG chewable tablet Chew 1 tablet by mouth daily.   . clopidogrel (PLAVIX) 75 MG tablet Take 75 mg by mouth daily.  Marland Kitchen estradiol (ESTRACE) 0.1 MG/GM vaginal cream Place 1 Applicatorful vaginally once a week.  . levothyroxine (SYNTHROID) 125 MCG tablet TAKE 1 TABLET BY MOUTH IN THE MORNING ON AN EMPTY STOMACH FOR THYROID  . lisinopril-hydrochlorothiazide (ZESTORETIC) 20-12.5 MG tablet TAKE 1 TABLET BY MOUTH EVERY DAY  . metFORMIN (GLUCOPHAGE) 1000 MG tablet TAKE 1 TABLET (1,000 MG TOTAL) BY MOUTH 2 (TWO) TIMES DAILY. TAKE WITH FOOD  . metoprolol (LOPRESSOR) 50 MG tablet Take 50 mg by mouth 2 (two) times daily.   . nitroGLYCERIN (NITROSTAT) 0.4 MG SL tablet Place 1 tablet (0.4 mg total) under the tongue every 5 (five) minutes as needed for chest pain.  [name] ULTRA test strip USE TO CHECK BLOOD SUGAR DAILY. E 11.9 (TYPE 2 DIABETES MELLITIS)  . OXYQUINOLONE SULFATE VAGINAL (TRIMO-SAN) 0.025 % GEL Place 1 Applicatorful vaginally every 30 (thirty) days.  . pantoprazole (PROTONIX) 40 MG tablet Take by mouth.  . simvastatin (ZOCOR) 40 MG tablet TAKE 1 TABLET BY MOUTH EVERY DAY FOR CHOLESTEROL  . triamcinolone cream (KENALOG) 0.1 % Apply 1 application topically 2 (two) times daily.  . vitamin B-12 (CYANOCOBALAMIN) 1000 MCG tablet Take 1,000 mcg by mouth daily.    No facility-administered medications prior to visit.    Review of Systems  Constitutional: Negative for appetite change, chills, fatigue and fever.  Respiratory: Negative for chest tightness and shortness of breath.  Cardiovascular: Negative for chest pain and palpitations.  Gastrointestinal: Negative for abdominal pain, nausea and vomiting.  Musculoskeletal: Positive for arthralgias (right knee pain).  Neurological: Negative for dizziness and weakness.  Psychiatric/Behavioral: Positive for hallucinations.       Objective    BP 123/72 (BP Location:  Right Arm, Patient Position: Sitting, Cuff Size: Normal)   Pulse 75   Temp 98.4 F (36.9 C) (Oral)   Resp 16   Wt 149 lb (67.6 kg)   BMI 25.98 kg/m     Physical Exam  General appearance:  Well developed, well nourished female, cooperative and in no acute distress Head: Normocephalic, without obvious abnormality, atraumatic Respiratory: Respirations even and unlabored, normal respiratory rate Extremities: All extremities are intact.  Skin: Skin color, texture, turgor normal. No rashes seen  Psych: Appropriate mood and affect. Neurologic: Mental status: Alert, oriented to person, place, and time, thought content appropriate.  Results for orders placed or performed in visit on 01/15/20  POCT HgB A1C  Result Value Ref Range   Hemoglobin A1C 8.3 (A) 4.0 - 5.6 %   Est. average glucose Bld gHb Est-mCnc 192     Assessment & Plan     1. Type 2 diabetes mellitus with hyperglycemia, without long-term current use of insulin (HCC) A1c is creeping up, but has been consuming much more sweets especially Halloween candy. She is going to cut that out. Continue current medications.  Recheck a1c in 3-4 months.   2. Need for influenza vaccination  - Flu Vaccine QUAD High Dose IM (Fluad)         The entirety of the information documented in the History of Present Illness, Review of Systems and Physical Exam were personally obtained by me. Portions of this information were initially documented by the CMA and reviewed by me for thoroughness and accuracy.      [name], MD  [employer] [phone] (phone) [phone] (fax)  [place]

## 2020-01-15 NOTE — Patient Instructions (Signed)
.   Please review the attached list of medications and notify my office if there are any errors.   . Call [phone] to schedule your Covid booster shot

## 2020-01-21 ENCOUNTER — Telehealth (INDEPENDENT_AMBULATORY_CARE_PROVIDER_SITE_OTHER): Payer: PPO

## 2020-01-21 DIAGNOSIS — R300 Dysuria: Secondary | ICD-10-CM | POA: Diagnosis not present

## 2020-01-21 NOTE — Telephone Encounter (Signed)
That's fine

## 2020-01-21 NOTE — Telephone Encounter (Signed)
Pt calling regarding this. Please advise.

## 2020-01-21 NOTE — Telephone Encounter (Signed)
Copied from [place] [id number]. Topic: Appointment Scheduling - Scheduling Inquiry for Clinic >> Jan 21, 2020 10:45 AM Lennox Solders wrote: Reason for CRM:Pt saw dr Caryn Section on 01-15-2020 and she forgot to mention that she is having burning when urinating and when like to come to office to do urine sample only. Please advise

## 2020-01-21 NOTE — Telephone Encounter (Signed)
Patient advised. She plans to come to the office tomorrow around 10:30 to drop off a sample.

## 2020-01-23 LAB — POCT URINALYSIS DIPSTICK
Blood, UA: NEGATIVE
Glucose, UA: NEGATIVE
Ketones, UA: NEGATIVE
Nitrite, UA: NEGATIVE
Protein, UA: POSITIVE — AB
Spec Grav, UA: 1.01 (ref 1.010–1.025)
Urobilinogen, UA: 0.2 E.U./dL
pH, UA: 7.5 (ref 5.0–8.0)

## 2020-01-23 NOTE — Telephone Encounter (Signed)
See result note.  

## 2020-01-23 NOTE — Telephone Encounter (Signed)
Patient came by the office this morning to give a urine sample. Please review results and advise if you would like to send for culture.

## 2020-01-29 MED ORDER — METOPROLOL TARTRATE 50 MG PO TABS: 50.0000 mg | ORAL_TABLET | Freq: Two times a day (BID) | ORAL | 12 refills | Status: DC

## 2020-01-29 MED ORDER — CLOPIDOGREL BISULFATE 75 MG PO TABS: 75.0000 mg | ORAL_TABLET | Freq: Every day | ORAL | 12 refills | Status: DC

## 2020-01-29 NOTE — Addendum Note (Signed)
Addended by: [name] E on: 01/29/2020 12:13 PM   Modules accepted: Orders

## 2020-02-04 ENCOUNTER — Ambulatory Visit (INDEPENDENT_AMBULATORY_CARE_PROVIDER_SITE_OTHER): Payer: PPO | Admitting: Obstetrics & Gynecology

## 2020-02-04 ENCOUNTER — Encounter: Payer: Self-pay | Admitting: Obstetrics & Gynecology

## 2020-02-04 ENCOUNTER — Other Ambulatory Visit: Payer: Self-pay | Admitting: Family Medicine

## 2020-02-04 ENCOUNTER — Other Ambulatory Visit: Payer: Self-pay

## 2020-02-04 VITALS — BP 140/80 | Ht 60.0 in | Wt 150.0 lb

## 2020-02-04 DIAGNOSIS — N814 Uterovaginal prolapse, unspecified: Secondary | ICD-10-CM | POA: Insufficient documentation

## 2020-02-04 DIAGNOSIS — R300 Dysuria: Secondary | ICD-10-CM | POA: Diagnosis not present

## 2020-02-04 DIAGNOSIS — N813 Complete uterovaginal prolapse: Secondary | ICD-10-CM | POA: Diagnosis not present

## 2020-02-04 LAB — POCT URINALYSIS DIPSTICK
Appearance: NEGATIVE
Bilirubin, UA: NEGATIVE
Blood, UA: NEGATIVE
Glucose, UA: NEGATIVE
Ketones, UA: NEGATIVE
Nitrite, UA: NEGATIVE
Protein, UA: NEGATIVE
Spec Grav, UA: 1.01 (ref 1.010–1.025)
Urobilinogen, UA: 0.2 E.U./dL
pH, UA: 5 (ref 5.0–8.0)

## 2020-02-04 NOTE — Patient Instructions (Addendum)
Recommendations to boost your immunity to prevent illness such as viral flu and colds, including covid19, are as follows:       - - -  Vitamin K2 and Vitamin D3  - - - Take Vitamin K2 at 200-300 mcg daily (usually 2-3 pills daily of the over the counter formulation). Take Vitamin D3 at 3000-4000 U daily (usually 3-4 pills daily of the over the counter formulation). Studies show that these two at high normal levels in your system are very effective in keeping your immunity so strong and protective that you will be unlikely to contract viral illness such as those listed above.  Dr Mahogani Holohan  Thank you for choosing [facility]. As part of our ongoing efforts to improve patient experience, we would appreciate your feedback. Please fill out the short survey that you will receive by mail or MyChart. Your opinion is important to us! -Dr Noga Fogg   

## 2020-02-04 NOTE — Progress Notes (Signed)
HPI:      [name] is a 79 y.o. [id number] who presents today for her pessary follow up and examination related to her pelvic floor weakening.  Pt reports tolerating the pessary well with no vaginal bleeding and no vaginal discharge.  Symptoms of pelvic floor weakening have greatly improved. She is voiding and defecating without difficulty other than intermittent dysuria. She currently has a Gellhorn type #3 pessary.  PMHx: She  has a past medical history of Alopecia, Anginal pain ([place]), Benign neoplasm of large bowel, CAD (coronary artery disease), Carpal tunnel syndrome, Complication of anesthesia, DM (diabetes mellitus) ([place]), Erosion of cervix, GERD (gastroesophageal reflux disease), H/O adenomatous polyp of colon, History of chicken pox, Hypercholesteremia, Hypertension, Hypothyroid, Irritable bowel, Malignant melanoma of foot ([place]), Osteoporosis, PONV (postoperative nausea and vomiting), Post-menopausal bleeding, Procidentia of uterus, Rectal bleeding, Sepsis ([place]) (02/18/2018), and TIA (transient ischemic attack). Also,  has a past surgical history that includes Tonsillectomy; Knee surgery; Cardiac catheterization (10/2010); Colonoscopy (03/2008); Breast excisional biopsy (Right); Cataract extraction w/PHACO (Right, 07/28/2016); Cataract extraction w/PHACO (Left, 10/27/2016); Esophagogastroduodenoscopy (egd) with propofol (N/A, 05/12/2018); and Colonoscopy with propofol (N/A, 05/12/2018)., family history includes Diabetes in her father; Heart disease in her paternal grandmother and sister; Lung cancer in her brother; Rheumatic fever in her sister.,  reports that she quit smoking about 31 years ago. She has never used smokeless tobacco. She reports that she does not drink alcohol and does not use drugs.  She has a current medication list which includes the following prescription(s): amlodipine, aspirin ec, calcium carbonate, clopidogrel, estradiol, levothyroxine, lisinopril-hydrochlorothiazide,  metformin, metoprolol tartrate, nitroglycerin, onetouch ultra, trimo-san, pantoprazole, simvastatin, triamcinolone, and vitamin b-12. Also, has No Known Allergies.  Review of Systems  All other systems reviewed and are negative.   Objective: BP 140/80   Ht 5' (1.524 m)   Wt 150 lb (68 kg)   BMI 29.29 kg/m  Physical Exam Constitutional:      General: She is not in acute distress.    Appearance: She is well-developed.  Genitourinary:     Vagina and uterus normal.     No vaginal erythema or bleeding.      Right Adnexa: not tender and no mass present.    Left Adnexa: not tender and no mass present.    No cervical motion tenderness, discharge, polyp or nabothian cyst.     Uterus is mobile.     Uterus is not enlarged.     No uterine mass detected.    Uterus is midaxial.     Pelvic exam was performed with patient supine.  HENT:     Head: Normocephalic and atraumatic.     Nose: Nose normal.  Abdominal:     General: There is no distension.     Palpations: Abdomen is soft.     Tenderness: There is no abdominal tenderness.  Musculoskeletal:        General: Normal range of motion.  Neurological:     Mental Status: She is alert and oriented to person, place, and time.     Cranial Nerves: No cranial nerve deficit.  Skin:    General: Skin is warm and dry.  Psychiatric:        Attention and Perception: Attention normal.        Mood and Affect: Mood and affect normal.        Speech: Speech normal.        Behavior: Behavior normal.        Thought Content:  Thought content normal.        Judgment: Judgment normal.   Cystocele present Prolapse of uterus to Gr 3   Pessary Care Pessary removed and cleaned.  Vagina checked - without erosions - pessary replaced.  UA + Leuk  A/P:   ICD-10-CM   1. Procidentia of uterus  N81.3   2. Cystocele with prolapse  N81.4   3. Dysuria  R30.0   Pessary was cleaned and replaced today. Instructions given for care. Concerning symptoms to  observe for are counseled to patient. Follow up scheduled for 3 months.  Will send urine for culture. Pt has past h/o UTI, no current symptoms. UA result w leuk  A total of 20 minutes were spent face-to-face with the patient as well as preparation, review, communication, and documentation during this encounter.   Barnett Applebaum, MD, [facility], [facility] 02/04/2020  1:15 PM

## 2020-02-07 LAB — URINE CULTURE

## 2020-02-12 ENCOUNTER — Other Ambulatory Visit: Payer: Self-pay | Admitting: Family Medicine

## 2020-02-12 NOTE — Telephone Encounter (Signed)
Requested Prescriptions  Pending Prescriptions Disp Refills  . ONETOUCH ULTRA test strip [employer] Med Name: ONE TOUCH ULTRA BLUE TEST STRP] 100 strip 4    Sig: USE TO CHECK BLOOD SUGAR DAILY. E 11.9 (TYPE 2 DIABETES MELLITIS)     Endocrinology: Diabetes - Testing Supplies Passed - 02/12/2020  3:40 PM      Passed - Valid encounter within last 12 months    Recent Outpatient Visits          4 weeks ago Type 2 diabetes mellitus with hyperglycemia, without long-term current use of insulin [facility])   [facility] Birdie Sons, MD   7 months ago Essential hypertension   [facility] Birdie Sons, MD   9 months ago Type 2 diabetes mellitus with hyperglycemia, without long-term current use of insulin [facility])   [facility] Birdie Sons, MD   1 year ago Type 2 diabetes mellitus without complication, without long-term current use of insulin [facility])   [facility] Birdie Sons, MD   1 year ago [place], Donald E, MD      Future Appointments            In 3 months Fisher, Kirstie Peri, MD [facility], [place]

## 2020-02-20 ENCOUNTER — Other Ambulatory Visit: Payer: Self-pay | Admitting: Family Medicine

## 2020-02-20 DIAGNOSIS — E039 Hypothyroidism, unspecified: Secondary | ICD-10-CM

## 2020-02-20 DIAGNOSIS — E119 Type 2 diabetes mellitus without complications: Secondary | ICD-10-CM

## 2020-03-17 DIAGNOSIS — H353221 Exudative age-related macular degeneration, left eye, with active choroidal neovascularization: Secondary | ICD-10-CM | POA: Diagnosis not present

## 2020-03-17 DIAGNOSIS — H353211 Exudative age-related macular degeneration, right eye, with active choroidal neovascularization: Secondary | ICD-10-CM | POA: Diagnosis not present

## 2020-04-22 NOTE — Progress Notes (Signed)
Subjective:   April Pittman is a 80 y.o. female who presents for Medicare Annual (Subsequent) preventive examination.  I connected with Park Pope today by telephone and verified that I am speaking with the correct person using two identifiers. Location patient: home Location provider: work Persons participating in the virtual visit: patient, provider.   I discussed the limitations, risks, security and privacy concerns of performing an evaluation and management service by telephone and the availability of in person appointments. I also discussed with the patient that there may be a patient responsible charge related to this service. The patient expressed understanding and verbally consented to this telephonic visit.    Interactive audio and video telecommunications were attempted between this provider and patient, however failed, due to patient having technical difficulties OR patient did not have access to video capability.  We continued and completed visit with audio only.   Review of Systems    N/A  Cardiac Risk Factors include: advanced age (>44men, >56 women);dyslipidemia;hypertension     Objective:    There were no vitals filed for this visit. There is no height or weight on file to calculate BMI.  Advanced Directives 04/23/2020 06/08/2018 04/14/2018 03/03/2018 02/18/2018 11/18/2017 11/16/2016  Does Patient Have a Medical Advance Directive? No Yes No No No;Yes No No  Type of Advance Directive - - - - Living will - -  Does patient want to make changes to medical advance directive? - - - No - Patient declined Yes (Inpatient - patient defers changing a medical advance directive at this time) - -  Would patient like information on creating a medical advance directive? No - Patient declined - - - No - Patient declined No - Patient declined -    Current Medications (verified) Outpatient Encounter Medications as of 04/23/2020  Medication Sig  . amLODipine (NORVASC) 5 MG tablet Take 5  mg by mouth daily.  Marland Kitchen aspirin EC 81 MG tablet Take 81 mg by mouth daily.  . calcium carbonate (OS-CAL) 1250 (500 Ca) MG chewable tablet Chew 1 tablet by mouth daily.   . cholecalciferol (VITAMIN D) 25 MCG (1000 UNIT) tablet Take 5,000 Units by mouth daily.  . clopidogrel (PLAVIX) 75 MG tablet Take 1 tablet (75 mg total) by mouth daily.  Marland Kitchen levothyroxine (SYNTHROID) 125 MCG tablet TAKE 1 TABLET BY MOUTH IN THE MORNING ON AN EMPTY STOMACH FOR THYROID  . lisinopril-hydrochlorothiazide (ZESTORETIC) 20-12.5 MG tablet TAKE 1 TABLET BY MOUTH EVERY DAY  . metFORMIN (GLUCOPHAGE) 1000 MG tablet TAKE 1 TABLET (1,000 MG TOTAL) BY MOUTH 2 (TWO) TIMES DAILY. TAKE WITH FOOD  . metoprolol tartrate (LOPRESSOR) 50 MG tablet Take 1 tablet (50 mg total) by mouth 2 (two) times daily.  . nitroGLYCERIN (NITROSTAT) 0.4 MG SL tablet Place 1 tablet (0.4 mg total) under the tongue every 5 (five) minutes as needed for chest pain.  [name] ULTRA test strip USE TO CHECK BLOOD SUGAR DAILY. E 11.9 (TYPE 2 DIABETES MELLITIS)  . OXYQUINOLONE SULFATE VAGINAL (TRIMO-SAN) 0.025 % GEL Place 1 Applicatorful vaginally every 30 (thirty) days.  . simvastatin (ZOCOR) 40 MG tablet TAKE 1 TABLET BY MOUTH EVERY DAY FOR CHOLESTEROL  . vitamin B-12 (CYANOCOBALAMIN) 1000 MCG tablet Take 1,000 mcg by mouth daily.   Marland Kitchen estradiol (ESTRACE) 0.1 MG/GM vaginal cream Place 1 Applicatorful vaginally once a week. (Patient not taking: Reported on 04/23/2020)  . pantoprazole (PROTONIX) 40 MG tablet Take by mouth. (Patient not taking: Reported on 04/23/2020)  . triamcinolone cream (KENALOG)  0.1 % Apply 1 application topically 2 (two) times daily. (Patient not taking: Reported on 04/23/2020)   No facility-administered encounter medications on file as of 04/23/2020.    Allergies (verified) Patient has no known allergies.   History: Past Medical History:  Diagnosis Date  . Alopecia   . Anginal pain ([place])   . Benign neoplasm of large bowel   . CAD (coronary  artery disease)   . Carpal tunnel syndrome   . Complication of anesthesia    vomits with any pain medicine  . DM (diabetes mellitus) ([place])   . Erosion of cervix   . GERD (gastroesophageal reflux disease)   . H/O adenomatous polyp of colon    with sever dysplasia, removed 2000 by colonoscopy  . History of chicken pox   . Hypercholesteremia   . Hypertension   . Hypothyroid   . Irritable bowel   . Malignant melanoma of foot ([place])   . Osteoporosis   . PONV (postoperative nausea and vomiting)   . Post-menopausal bleeding   . Procidentia of uterus    with traction cystocele, managed with gellhorn pessary  . Rectal bleeding   . Sepsis ([place]) 02/18/2018  . TIA (transient ischemic attack)    Past Surgical History:  Procedure Laterality Date  . BREAST EXCISIONAL BIOPSY Right    Benign  . CARDIAC CATHETERIZATION  10/2010   +stenting to LAD and RCA; symptoms: chest pain, elevated Troponin, +AMI. [employer]  . CATARACT EXTRACTION W/PHACO Right 07/28/2016   Procedure: CATARACT EXTRACTION PHACO AND INTRAOCULAR LENS PLACEMENT ([place])  Right Diabetic;  Surgeon: Leandrew Koyanagi, MD;  Location: [facility];  Service: Ophthalmology;  Laterality: Right;  Diabetic leave patient at 9 arrival  . CATARACT EXTRACTION W/PHACO Left 10/27/2016   Procedure: CATARACT EXTRACTION PHACO AND INTRAOCULAR LENS PLACEMENT ([place]) LEFT DIABETIC;  Surgeon: Leandrew Koyanagi, MD;  Location: [facility];  Service: Ophthalmology;  Laterality: Left;  . COLONOSCOPY  03/2008   2 polyps, internal and external hemorrhoids, +anal growth. Dr. Vira Agar  . COLONOSCOPY WITH PROPOFOL N/A 05/12/2018   Procedure: COLONOSCOPY WITH PROPOFOL;  Surgeon: Manya Silvas, MD;  Location: [facility] ENDOSCOPY;  Service: Endoscopy;  Laterality: N/A;  . ESOPHAGOGASTRODUODENOSCOPY (EGD) WITH PROPOFOL N/A 05/12/2018   Procedure: ESOPHAGOGASTRODUODENOSCOPY (EGD) WITH PROPOFOL;  Surgeon: Manya Silvas, MD;  [facility]  ENDOSCOPY;  Service: Endoscopy;  Laterality: N/A;  . KNEE SURGERY    . TONSILLECTOMY     as a child   Family History  Problem Relation Age of Onset  . Lung cancer Brother   . Diabetes Father   . Heart disease Sister        Valvular rheumatic heart disease  . Rheumatic fever Sister   . Heart disease Paternal Grandmother   . Breast cancer Neg Hx   . Ovarian cancer Neg Hx   . Colon cancer Neg Hx    Social History   Socioeconomic History  . Marital status: Married    Spouse name: Not on file  . Number of children: 3  . Years of education: Not on file  . Highest education level: GED or equivalent  Occupational History  . Occupation: Retired    Comment: former Engineer, manufacturing systems  Tobacco Use  . Smoking status: Former Smoker    Quit date: 03/03/1988    Years since quitting: 32.1  . Smokeless tobacco: Never Used  Vaping Use  . Vaping Use: Never used  Substance and Sexual Activity  . Alcohol use: No  Alcohol/week: 0.0 standard drinks  . Drug use: No  . Sexual activity: Not Currently  Other Topics Concern  . Not on file  Social History Narrative  . Not on file   Social Determinants of Health   Financial Resource Strain: Low Risk   . Difficulty of Paying Living Expenses: Not hard at all  Food Insecurity: No Food Insecurity  . Worried About Charity fundraiser in the Last Year: Never true  . Ran Out of Food in the Last Year: Never true  Transportation Needs: No Transportation Needs  . Lack of Transportation (Medical): No  . Lack of Transportation (Non-Medical): No  Physical Activity: Inactive  . Days of Exercise per Week: 0 days  . Minutes of Exercise per Session: 0 min  Stress: No Stress Concern Present  . Feeling of Stress : Not at all  Social Connections: Moderately Isolated  . Frequency of Communication with Friends and Family: More than three times a week  . Frequency of Social Gatherings with Friends and Family: Twice a week  . Attends Religious Services: Never   . Active Member of Clubs or Organizations: No  . Attends Archivist Meetings: Never  . Marital Status: Married    Tobacco Counseling Counseling given: Not Answered   Clinical Intake:  Pre-visit preparation completed: Yes  Pain : No/denies pain     Nutritional Risks: None Diabetes: Yes  How often do you need to have someone help you when you read instructions, pamphlets, or other written materials from your doctor or pharmacy?: 3 - Sometimes (Due to MD in both eyes.)  Diabetic? Yes  Nutrition Risk Assessment:  Has the patient had any N/V/D within the last 2 months?  No  Does the patient have any non-healing wounds?  No  Has the patient had any unintentional weight loss or weight gain?  No   Diabetes:  Is the patient diabetic?  Yes  If diabetic, was a CBG obtained today?  No  Did the patient bring in their glucometer from home?  No  How often do you monitor your CBG's? Once every other day.   Financial Strains and Diabetes Management:  Are you having any financial strains with the device, your supplies or your medication? No .  Does the patient want to be seen by Chronic Care Management for management of their diabetes?  No  Would the patient like to be referred to a Nutritionist or for Diabetic Management?  No   Diabetic Exams:  Diabetic Eye Exam: Completed 12/31/19 Diabetic Foot Exam: Overdue, Pt has been advised about the importance in completing this exam.    Interpreter Needed?: No  Information entered by :: [facility], LPN   Activities of Daily Living In your present state of health, do you have any difficulty performing the following activities: 04/23/2020 01/15/2020  Hearing? N N  Vision? Y Y  Comment Due to wet MD in both eyes. -  Difficulty concentrating or making decisions? N N  Walking or climbing stairs? Y Y  Comment Due to right knee pains. -  Dressing or bathing? N N  Doing errands, shopping? Y N  Comment Does not drive due to  vision. -  Preparing Food and eating ? N -  Using the Toilet? N -  In the past six months, have you accidently leaked urine? N -  Do you have problems with loss of bowel control? N -  Managing your Medications? N -  Managing your Finances? N -  Housekeeping or managing your Housekeeping? N -  Some recent data might be hidden    Patient Care Team: Birdie Sons, MD as PCP - General (Family Medicine) Ubaldo Glassing Javier Docker, MD as Consulting Physician (Cardiology) Gae Dry, MD as Referring Physician (Obstetrics and Gynecology) Isaias Sakai, MD as Referring Physician (Ophthalmology) Leandrew Koyanagi, MD as Referring Physician (Ophthalmology)  Indicate any recent Medical Services you may have received from other than [facility] providers in the past year (date may be approximate).     Assessment:   This is a routine wellness examination for [place].  Hearing/Vision screen No exam data present  Dietary issues and exercise activities discussed: Current Exercise Habits: The patient does not participate in regular exercise at present, Exercise limited by: orthopedic condition(s)  Goals    . Increase water intake     Recommend increasing water intake to 6-8 glasses of water a day.       Depression Screen PHQ 2/9 Scores 04/23/2020 01/15/2020 11/23/2018 11/18/2017 11/16/2016 12/31/2015 11/11/2014  PHQ - 2 Score 1 0 0 2 2 0 0  PHQ- 9 Score - - - 8 10 - -    Fall Risk Fall Risk  04/23/2020 09/18/2019 11/23/2018 11/18/2017 01/11/2017  Falls in the past year? 0 1 1 No No  Comment - Emmi Telephone Survey: data to providers prior to load - - -  Number falls in past yr: 0 1 1 - -  Comment - Emmi Telephone Survey Actual Response = 4 - - -  Injury with Fall? 0 1 1 - -  Comment - - - - -  Risk for fall due to : - - Impaired mobility;Impaired vision - -  Follow up - - Falls prevention discussed - -    FALL RISK PREVENTION PERTAINING TO THE HOME:  Any stairs in or around the home? Yes  If so, are  there any without handrails? No  Home free of loose throw rugs in walkways, pet beds, electrical cords, etc? Yes  Adequate lighting in your home to reduce risk of falls? Yes   ASSISTIVE DEVICES UTILIZED TO PREVENT FALLS:  Life alert? No  Use of a cane, walker or w/c? No  Grab bars in the bathroom? No  Shower chair or bench in shower? No  Elevated toilet seat or a handicapped toilet? Yes   Cognitive Function: Normal cognitive status assessed by observation by this Nurse Health Advisor. No abnormalities found.      6CIT Screen 11/18/2017  What Year? 0 points  What month? 0 points  What time? 0 points  Count back from 20 0 points  Months in reverse 2 points  Repeat phrase 0 points  Total Score 2    Immunizations Immunization History  Administered Date(s) Administered  . Fluad Quad(high Dose 65+) 11/27/2018, 01/15/2020  . Influenza, High Dose Seasonal PF 01/29/2015, 12/31/2015, 01/11/2017, 12/15/2017  . Moderna Sars-Covid-2 Vaccination 02/10/2020  . PFIZER(Purple Top)SARS-COV-2 Vaccination 03/08/2019, 03/29/2019  . Pneumococcal Conjugate-13 11/07/2013  . Pneumococcal Polysaccharide-23 12/07/2010  . Zoster 09/15/2015    TDAP status: Due, Education has been provided regarding the importance of this vaccine. Advised may receive this vaccine at local pharmacy or Health Dept. Aware to provide a copy of the vaccination record if obtained from local pharmacy or Health Dept. Verbalized acceptance and understanding.  Flu Vaccine status: Up to date  Pneumococcal vaccine status: Up to date  Covid-19 vaccine status: Completed vaccines  Qualifies for Shingles Vaccine? Yes   Zostavax completed Yes  Shingrix Completed?: No.    Education has been provided regarding the importance of this vaccine. Patient has been advised to call insurance company to determine out of pocket expense if they have not yet received this vaccine. Advised may also receive vaccine at local pharmacy or Health  Dept. Verbalized acceptance and understanding.  Screening Tests Health Maintenance  Topic Date Due  . FOOT EXAM  05/12/2018  . TETANUS/TDAP  04/23/2021 (Originally 12/06/1959)  . HEMOGLOBIN A1C  07/14/2020  . COVID-19 Vaccine (4 - Booster) 08/10/2020  . DEXA SCAN  12/13/2020  . OPHTHALMOLOGY EXAM  12/30/2020  . COLONOSCOPY (Pts 45-54yrs Insurance coverage will need to be confirmed)  05/12/2023  . INFLUENZA VACCINE  Completed  . PNA vac Low Risk Adult  Completed  . HPV VACCINES  Aged Out    Health Maintenance  Health Maintenance Due  Topic Date Due  . FOOT EXAM  05/12/2018    Colorectal cancer screening: Type of screening: Colonoscopy. Completed 05/12/18. Repeat every 5 years  Mammogram status: No longer required due to age.  Bone Density status: Completed 11/24/18. Results reflect: Bone density results: OSTEOPOROSIS. Repeat every 2 years.  Lung Cancer Screening: (Low Dose CT Chest recommended if Age 36-80 years, 30 pack-year currently smoking OR have quit w/in 15years.) does not qualify.   Additional Screening:  Vision Screening: Recommended annual ophthalmology exams for early detection of glaucoma and other disorders of the eye. Is the patient up to date with their annual eye exam?  Yes  Who is the provider or what is the name of the office in which the patient attends annual eye exams? Dr Wallace Going and Dr Roosevelt Locks @ [place] If pt is not established with a provider, would they like to be referred to a provider to establish care? No .   Dental Screening: Recommended annual dental exams for proper oral hygiene  Community Resource Referral / Chronic Care Management: CRR required this visit?  No   CCM required this visit?  No      Plan:     I have personally reviewed and noted the following in the patient's chart:   . Medical and social history . Use of alcohol, tobacco or illicit drugs  . Current medications and supplements . Functional ability and status . Nutritional  status . Physical activity . Advanced directives . List of other physicians . Hospitalizations, surgeries, and ER visits in previous 12 months . Vitals . Screenings to include cognitive, depression, and falls . Referrals and appointments  In addition, I have reviewed and discussed with patient certain preventive protocols, quality metrics, and best practice recommendations. A written personalized care plan for preventive services as well as general preventive health recommendations were provided to patient.     Normal Recinos [place], [place]   03/31/9483   Nurse Notes: Pt needs a diabetic foot exam at next in office apt.

## 2020-04-23 ENCOUNTER — Ambulatory Visit (INDEPENDENT_AMBULATORY_CARE_PROVIDER_SITE_OTHER): Payer: PPO

## 2020-04-23 ENCOUNTER — Other Ambulatory Visit: Payer: Self-pay

## 2020-04-23 DIAGNOSIS — Z0000 Encounter for general adult medical examination without abnormal findings: Secondary | ICD-10-CM | POA: Diagnosis not present

## 2020-04-23 NOTE — Patient Instructions (Signed)
[name] , Thank you for taking time to come for your Medicare Wellness Visit. I appreciate your ongoing commitment to your health goals. Please review the following plan we discussed and let me know if I can assist you in the future.   Screening recommendations/referrals: Colonoscopy: Up to date, due 04/2023 Mammogram: No longer required.  Bone Density: Up to date, due 11/2020 Recommended yearly ophthalmology/optometry visit for glaucoma screening and checkup Recommended yearly dental visit for hygiene and checkup  Vaccinations: Influenza vaccine: Done 01/15/20 Pneumococcal vaccine: Completed series Tdap vaccine: Currently due, declined receiving. Shingles vaccine: Shingrix discussed. Please contact your pharmacy for coverage information.     Advanced directives: Advance directive discussed with you today. Even though you declined this today please call our office should you change your mind and we can give you the proper paperwork for you to fill out.  Conditions/risks identified: Recommend increasing water intake to 6-8 glasses of water a day.   Next appointment: 05/16/20 @ 11:00 AM with Dr Caryn Section    Preventive Care 80 Years and Older, Female Preventive care refers to lifestyle choices and visits with your health care provider that can promote health and wellness. What does preventive care include?  A yearly physical exam. This is also called an annual well check.  Dental exams once or twice a year.  Routine eye exams. Ask your health care provider how often you should have your eyes checked.  Personal lifestyle choices, including:  Daily care of your teeth and gums.  Regular physical activity.  Eating a healthy diet.  Avoiding tobacco and drug use.  Limiting alcohol use.  Practicing safe sex.  Taking low-dose aspirin every day.  Taking vitamin and mineral supplements as recommended by your health care provider. What happens during an annual well check? The  services and screenings done by your health care provider during your annual well check will depend on your age, overall health, lifestyle risk factors, and family history of disease. Counseling  Your health care provider may ask you questions about your:  Alcohol use.  Tobacco use.  Drug use.  Emotional well-being.  Home and relationship well-being.  Sexual activity.  Eating habits.  History of falls.  Memory and ability to understand (cognition).  Work and work Statistician.  Reproductive health. Screening  You may have the following tests or measurements:  Height, weight, and BMI.  Blood pressure.  Lipid and cholesterol levels. These may be checked every 5 years, or more frequently if you are over 32 years old.  Skin check.  Lung cancer screening. You may have this screening every year starting at age 36 if you have a 30-pack-year history of smoking and currently smoke or have quit within the past 15 years.  Fecal occult blood test (FOBT) of the stool. You may have this test every year starting at age 3.  Flexible sigmoidoscopy or colonoscopy. You may have a sigmoidoscopy every 5 years or a colonoscopy every 10 years starting at age 35.  Hepatitis C blood test.  Hepatitis B blood test.  Sexually transmitted disease (STD) testing.  Diabetes screening. This is done by checking your blood sugar (glucose) after you have not eaten for a while (fasting). You may have this done every 1-3 years.  Bone density scan. This is done to screen for osteoporosis. You may have this done starting at age 31.  Mammogram. This may be done every 1-2 years. Talk to your health care provider about how often you should have regular  mammograms. Talk with your health care provider about your test results, treatment options, and if necessary, the need for more tests. Vaccines  Your health care provider may recommend certain vaccines, such as:  Influenza vaccine. This is recommended  every year.  Tetanus, diphtheria, and acellular pertussis (Tdap, Td) vaccine. You may need a Td booster every 10 years.  Zoster vaccine. You may need this after age 23.  Pneumococcal 13-valent conjugate (PCV13) vaccine. One dose is recommended after age 24.  Pneumococcal polysaccharide (PPSV23) vaccine. One dose is recommended after age 23. Talk to your health care provider about which screenings and vaccines you need and how often you need them. This information is not intended to replace advice given to you by your health care provider. Make sure you discuss any questions you have with your health care provider. Document Released: 03/07/2015 Document Revised: 10/29/2015 Document Reviewed: 12/10/2014 Elsevier Interactive Patient Education  2017 [employer] Prevention in the Home Falls can cause injuries. They can happen to people of all ages. There are many things you can do to make your home safe and to help prevent falls. What can I do on the outside of my home?  Regularly fix the edges of walkways and driveways and fix any cracks.  Remove anything that might make you trip as you walk through a door, such as a raised step or threshold.  Trim any bushes or trees on the path to your home.  Use bright outdoor lighting.  Clear any walking paths of anything that might make someone trip, such as rocks or tools.  Regularly check to see if handrails are loose or broken. Make sure that both sides of any steps have handrails.  Any raised decks and porches should have guardrails on the edges.  Have any leaves, snow, or ice cleared regularly.  Use sand or salt on walking paths during winter.  Clean up any spills in your garage right away. This includes oil or grease spills. What can I do in the bathroom?  Use night lights.  Install grab bars by the toilet and in the tub and shower. Do not use towel bars as grab bars.  Use non-skid mats or decals in the tub or shower.  If  you need to sit down in the shower, use a plastic, non-slip stool.  Keep the floor dry. Clean up any water that spills on the floor as soon as it happens.  Remove soap buildup in the tub or shower regularly.  Attach bath mats securely with double-sided non-slip rug tape.  Do not have throw rugs and other things on the floor that can make you trip. What can I do in the bedroom?  Use night lights.  Make sure that you have a light by your bed that is easy to reach.  Do not use any sheets or blankets that are too big for your bed. They should not hang down onto the floor.  Have a firm chair that has side arms. You can use this for support while you get dressed.  Do not have throw rugs and other things on the floor that can make you trip. What can I do in the kitchen?  Clean up any spills right away.  Avoid walking on wet floors.  Keep items that you use a lot in easy-to-reach places.  If you need to reach something above you, use a strong step stool that has a grab bar.  Keep electrical cords out of the way.  Do not use floor polish or wax that makes floors slippery. If you must use wax, use non-skid floor wax.  Do not have throw rugs and other things on the floor that can make you trip. What can I do with my stairs?  Do not leave any items on the stairs.  Make sure that there are handrails on both sides of the stairs and use them. Fix handrails that are broken or loose. Make sure that handrails are as long as the stairways.  Check any carpeting to make sure that it is firmly attached to the stairs. Fix any carpet that is loose or worn.  Avoid having throw rugs at the top or bottom of the stairs. If you do have throw rugs, attach them to the floor with carpet tape.  Make sure that you have a light switch at the top of the stairs and the bottom of the stairs. If you do not have them, ask someone to add them for you. What else can I do to help prevent falls?  Wear shoes  that:  Do not have high heels.  Have rubber bottoms.  Are comfortable and fit you well.  Are closed at the toe. Do not wear sandals.  If you use a stepladder:  Make sure that it is fully opened. Do not climb a closed stepladder.  Make sure that both sides of the stepladder are locked into place.  Ask someone to hold it for you, if possible.  Clearly mark and make sure that you can see:  Any grab bars or handrails.  First and last steps.  Where the edge of each step is.  Use tools that help you move around (mobility aids) if they are needed. These include:  Canes.  Walkers.  Scooters.  Crutches.  Turn on the lights when you go into a dark area. Replace any light bulbs as soon as they burn out.  Set up your furniture so you have a clear path. Avoid moving your furniture around.  If any of your floors are uneven, fix them.  If there are any pets around you, be aware of where they are.  Review your medicines with your doctor. Some medicines can make you feel dizzy. This can increase your chance of falling. Ask your doctor what other things that you can do to help prevent falls. This information is not intended to replace advice given to you by your health care provider. Make sure you discuss any questions you have with your health care provider. Document Released: 12/05/2008 Document Revised: 07/17/2015 Document Reviewed: 03/15/2014 Elsevier Interactive Patient Education  2017 [employer].

## 2020-05-08 ENCOUNTER — Ambulatory Visit (INDEPENDENT_AMBULATORY_CARE_PROVIDER_SITE_OTHER): Payer: PPO | Admitting: Obstetrics & Gynecology

## 2020-05-08 ENCOUNTER — Other Ambulatory Visit: Payer: Self-pay

## 2020-05-08 ENCOUNTER — Encounter: Payer: Self-pay | Admitting: Obstetrics & Gynecology

## 2020-05-08 VITALS — BP 120/70 | Ht 63.0 in | Wt <= 1120 oz

## 2020-05-08 DIAGNOSIS — N814 Uterovaginal prolapse, unspecified: Secondary | ICD-10-CM | POA: Diagnosis not present

## 2020-05-08 DIAGNOSIS — N813 Complete uterovaginal prolapse: Secondary | ICD-10-CM | POA: Diagnosis not present

## 2020-05-08 NOTE — Progress Notes (Signed)
HPI:      [name] is a 80 y.o. [id number] who presents today for her pessary follow up and examination related to her pelvic floor weakening.  Pt reports tolerating the pessary well with no vaginal bleeding and no vaginal discharge, occas vag itching.  Uses premarin vag cream sporadically..  Symptoms of pelvic floor weakening have greatly improved. She is voiding and defecating without difficulty. She currently has a Gellhorn type pessary #3.  PMHx: She  has a past medical history of Alopecia, Anginal pain ([place]), Benign neoplasm of large bowel, CAD (coronary artery disease), Carpal tunnel syndrome, Complication of anesthesia, DM (diabetes mellitus) ([place]), Erosion of cervix, GERD (gastroesophageal reflux disease), H/O adenomatous polyp of colon, History of chicken pox, Hypercholesteremia, Hypertension, Hypothyroid, Irritable bowel, Malignant melanoma of foot ([place]), Osteoporosis, PONV (postoperative nausea and vomiting), Post-menopausal bleeding, Procidentia of uterus, Rectal bleeding, Sepsis ([place]) (02/18/2018), and TIA (transient ischemic attack). Also,  has a past surgical history that includes Tonsillectomy; Knee surgery; Cardiac catheterization (10/2010); Colonoscopy (03/2008); Breast excisional biopsy (Right); Cataract extraction w/PHACO (Right, 07/28/2016); Cataract extraction w/PHACO (Left, 10/27/2016); Esophagogastroduodenoscopy (egd) with propofol (N/A, 05/12/2018); and Colonoscopy with propofol (N/A, 05/12/2018)., family history includes Diabetes in her father; Heart disease in her paternal grandmother and sister; Lung cancer in her brother; Rheumatic fever in her sister.,  reports that she quit smoking about 32 years ago. She has never used smokeless tobacco. She reports that she does not drink alcohol and does not use drugs.  She has a current medication list which includes the following prescription(s): amlodipine, aspirin ec, calcium carbonate, cholecalciferol, clopidogrel, estradiol,  levothyroxine, lisinopril-hydrochlorothiazide, metformin, metoprolol tartrate, nitroglycerin, onetouch ultra, trimo-san, pantoprazole, simvastatin, triamcinolone, and vitamin b-12. Also, has No Known Allergies.  Review of Systems  All other systems reviewed and are negative.   Objective: BP 120/70   Ht 5\' 3"  (1.6 m)   Wt 47 lb (21.3 kg)   BMI 8.33 kg/m  Physical Exam Constitutional:      General: She is not in acute distress.    Appearance: She is well-developed.  Genitourinary:     Bladder and urethral meatus normal.     Right Labia: No rash or tenderness.    Left Labia: No tenderness or rash.    No vaginal erythema or bleeding.     Mild vaginal atrophy present.     Right Adnexa: not tender and no mass present.    Left Adnexa: not tender and no mass present.    No cervical motion tenderness, discharge, polyp or nabothian cyst.     Uterus is prolapsed.     Uterus is not enlarged.     No uterine mass detected.    Uterus is midaxial.     Bladder exam comments: Cystocele .     Pelvic exam was performed with patient in the lithotomy position.  HENT:     Head: Normocephalic and atraumatic.     Nose: Nose normal.  Abdominal:     General: There is no distension.     Palpations: Abdomen is soft.     Tenderness: There is no abdominal tenderness.  Musculoskeletal:        General: Normal range of motion.  Neurological:     Mental Status: She is alert and oriented to person, place, and time.     Cranial Nerves: No cranial nerve deficit.  Skin:    General: Skin is warm and dry.  Psychiatric:        Attention and Perception:  Attention normal.        Mood and Affect: Mood and affect normal.        Speech: Speech normal.        Behavior: Behavior normal.        Thought Content: Thought content normal.        Judgment: Judgment normal.     Pessary Care Pessary removed and cleaned.  Vagina checked - without erosions - pessary replaced.  A/P:   ICD-10-CM   1. Procidentia of  uterus  N81.3   2. Cystocele with prolapse  N81.4   Pessary was cleaned and replaced today. Instructions given for care. Concerning symptoms to observe for are counseled to patient. Follow up scheduled for 3 months.  A total of 20 minutes were spent face-to-face with the patient as well as preparation, review, communication, and documentation during this encounter.   Barnett Applebaum, MD, [facility], [facility] 05/08/2020  1:35 PM

## 2020-05-16 ENCOUNTER — Encounter: Payer: Self-pay | Admitting: Family Medicine

## 2020-05-16 ENCOUNTER — Other Ambulatory Visit: Payer: Self-pay

## 2020-05-16 ENCOUNTER — Ambulatory Visit (INDEPENDENT_AMBULATORY_CARE_PROVIDER_SITE_OTHER): Payer: PPO | Admitting: Family Medicine

## 2020-05-16 VITALS — BP 119/95 | HR 79 | Temp 98.1°F | Resp 16 | Ht 62.0 in | Wt 149.0 lb

## 2020-05-16 DIAGNOSIS — I10 Essential (primary) hypertension: Secondary | ICD-10-CM | POA: Diagnosis not present

## 2020-05-16 DIAGNOSIS — M810 Age-related osteoporosis without current pathological fracture: Secondary | ICD-10-CM | POA: Diagnosis not present

## 2020-05-16 DIAGNOSIS — Z23 Encounter for immunization: Secondary | ICD-10-CM

## 2020-05-16 DIAGNOSIS — Z289 Immunization not carried out for unspecified reason: Secondary | ICD-10-CM | POA: Diagnosis not present

## 2020-05-16 DIAGNOSIS — E538 Deficiency of other specified B group vitamins: Secondary | ICD-10-CM

## 2020-05-16 DIAGNOSIS — E039 Hypothyroidism, unspecified: Secondary | ICD-10-CM | POA: Diagnosis not present

## 2020-05-16 DIAGNOSIS — I700 Atherosclerosis of aorta: Secondary | ICD-10-CM | POA: Diagnosis not present

## 2020-05-16 DIAGNOSIS — D509 Iron deficiency anemia, unspecified: Secondary | ICD-10-CM

## 2020-05-16 DIAGNOSIS — E1165 Type 2 diabetes mellitus with hyperglycemia: Secondary | ICD-10-CM

## 2020-05-16 LAB — POCT GLYCOSYLATED HEMOGLOBIN (HGB A1C)
Est. average glucose Bld gHb Est-mCnc: 177
Hemoglobin A1C: 7.8 % — AB (ref 4.0–5.6)

## 2020-05-16 MED ORDER — TETANUS-DIPHTH-ACELL PERTUSSIS 5-2.5-18.5 LF-MCG/0.5 IM SUSY: 0.5000 mL | PREFILLED_SYRINGE | Freq: Once | INTRAMUSCULAR | 0 refills | Status: AC

## 2020-05-16 MED ORDER — SHINGRIX 50 MCG/0.5ML IM SUSR: 0.5000 mL | Freq: Once | INTRAMUSCULAR | 0 refills | Status: AC

## 2020-05-16 NOTE — Progress Notes (Signed)
Established patient visit   Patient: April Pittman   DOB: Feb 28, 1940   80 y.o. Female  MRN: [id number] Visit Date: 05/16/2020  Today's healthcare provider: [name], MD   Chief Complaint  Patient presents with  . Diabetes  . Hypertension   Subjective    HPI  Diabetes Mellitus Type II, Follow-up  Lab Results  Component Value Date   HGBA1C 7.8 (A) 05/16/2020   HGBA1C 8.3 (A) 01/15/2020   HGBA1C 7.4 (A) 05/11/2019   Wt Readings from Last 3 Encounters:  05/16/20 149 lb (67.6 kg)  05/08/20 47 lb (21.3 kg)  02/04/20 150 lb (68 kg)   Last seen for diabetes 4 months ago.  Management since then includes advising patient to cut back on sweets in her diet. She reports good compliance with treatment. She is not having side effects.  Symptoms: No fatigue No foot ulcerations  No appetite changes No nausea  No paresthesia of the feet  No polydipsia  No polyuria No visual disturbances   No vomiting     Home blood sugar records: trend: stable  Episodes of hypoglycemia? No    Current insulin regiment: none Most Recent Eye Exam: 12/31/2019 Current exercise: no regular exercise Current diet habits: well balanced  Pertinent Labs: Lab Results  Component Value Date   CHOL 142 05/11/2019   HDL 49 05/11/2019   LDLCALC 64 05/11/2019   LDLDIRECT 73 05/11/2017   TRIG 171 (H) 05/11/2019   CHOLHDL 2.9 05/11/2019   Lab Results  Component Value Date   NA 139 05/11/2019   K 3.7 05/11/2019   CREATININE 0.69 05/11/2019   GFRNONAA 84 05/11/2019   GFRAA 96 05/11/2019   GLUCOSE 125 (H) 05/11/2019     ---------------------------------------------------------------------------------------------------  Hypertension, follow-up  BP Readings from Last 3 Encounters:  05/16/20 (!) 119/95  05/08/20 120/70  02/04/20 140/80   Wt Readings from Last 3 Encounters:  05/16/20 149 lb (67.6 kg)  05/08/20 47 lb (21.3 kg)  02/04/20 150 lb (68 kg)     She was last seen for  hypertension 10 months ago.  BP at that visit was 122/62. Management since that visit includes continuing same medications.  She reports good compliance with treatment. She is not having side effects.  She is following a Regular diet. She is not exercising. She does not smoke.  Use of agents associated with hypertension: none.   Outside blood pressures are not being checked. Symptoms: No chest pain No chest pressure  No palpitations No syncope  No dyspnea No orthopnea  No paroxysmal nocturnal dyspnea No lower extremity edema   Pertinent labs: Lab Results  Component Value Date   CHOL 142 05/11/2019   HDL 49 05/11/2019   LDLCALC 64 05/11/2019   LDLDIRECT 73 05/11/2017   TRIG 171 (H) 05/11/2019   CHOLHDL 2.9 05/11/2019   Lab Results  Component Value Date   NA 139 05/11/2019   K 3.7 05/11/2019   CREATININE 0.69 05/11/2019   GFRNONAA 84 05/11/2019   GFRAA 96 05/11/2019   GLUCOSE 125 (H) 05/11/2019     The 10-year ASCVD risk score Mikey Bussing DC Jr., et al., 2013) is: 46.4%   ---------------------------------------------------------------------------------------------------     Medications: Outpatient Medications Prior to Visit  Medication Sig  . amLODipine (NORVASC) 5 MG tablet Take 5 mg by mouth daily.  Marland Kitchen aspirin EC 81 MG tablet Take 81 mg by mouth daily.  . calcium carbonate (OS-CAL) 1250 (500 Ca) MG chewable tablet Chew 1  tablet by mouth daily.   . cholecalciferol (VITAMIN D) 25 MCG (1000 UNIT) tablet Take 5,000 Units by mouth daily.  . clopidogrel (PLAVIX) 75 MG tablet Take 1 tablet (75 mg total) by mouth daily.  Marland Kitchen estradiol (ESTRACE) 0.1 MG/GM vaginal cream Place 1 Applicatorful vaginally once a week.  . levothyroxine (SYNTHROID) 125 MCG tablet TAKE 1 TABLET BY MOUTH IN THE MORNING ON AN EMPTY STOMACH FOR THYROID  . lisinopril-hydrochlorothiazide (ZESTORETIC) 20-12.5 MG tablet TAKE 1 TABLET BY MOUTH EVERY DAY  . metFORMIN (GLUCOPHAGE) 1000 MG tablet TAKE 1 TABLET  (1,000 MG TOTAL) BY MOUTH 2 (TWO) TIMES DAILY. TAKE WITH FOOD  . metoprolol tartrate (LOPRESSOR) 50 MG tablet Take 1 tablet (50 mg total) by mouth 2 (two) times daily.  . nitroGLYCERIN (NITROSTAT) 0.4 MG SL tablet Place 1 tablet (0.4 mg total) under the tongue every 5 (five) minutes as needed for chest pain.  [name] ULTRA test strip USE TO CHECK BLOOD SUGAR DAILY. E 11.9 (TYPE 2 DIABETES MELLITIS)  . OXYQUINOLONE SULFATE VAGINAL (TRIMO-SAN) 0.025 % GEL Place 1 Applicatorful vaginally every 30 (thirty) days.  . pantoprazole (PROTONIX) 40 MG tablet Take by mouth.  . simvastatin (ZOCOR) 40 MG tablet TAKE 1 TABLET BY MOUTH EVERY DAY FOR CHOLESTEROL  . triamcinolone cream (KENALOG) 0.1 % Apply 1 application topically 2 (two) times daily.  . vitamin B-12 (CYANOCOBALAMIN) 1000 MCG tablet Take 1,000 mcg by mouth daily.    No facility-administered medications prior to visit.    Review of Systems  Constitutional: Negative.   Respiratory: Negative for cough and shortness of breath.   Cardiovascular: Negative for chest pain, palpitations and leg swelling.  Endocrine: Negative for cold intolerance, heat intolerance, polydipsia, polyphagia and polyuria.  Musculoskeletal: Negative for arthralgias, gait problem and joint swelling.  Neurological: Negative for dizziness, light-headedness and headaches.       Objective    BP (!) 119/95   Pulse 79   Temp 98.1 F (36.7 C)   Resp 16   Ht 5\' 2"  (1.575 m)   Wt 149 lb (67.6 kg)   BMI 27.25 kg/m     Physical Exam    General: Appearance:     Well developed, well nourished female in no acute distress  Eyes:    PERRL, conjunctiva/corneas clear, EOM's intact       Lungs:     Clear to auscultation bilaterally, respirations unlabored  Heart:    Normal heart rate. Normal rhythm. No murmurs, rubs, or gallops.   MS:   All extremities are intact.   Neurologic:   Awake, alert, oriented x 3. No apparent focal neurological           defect.         Results for orders placed or performed in visit on 05/16/20  POCT glycosylated hemoglobin (Hb A1C)  Result Value Ref Range   Hemoglobin A1C 7.8 (A) 4.0 - 5.6 %   Est. average glucose Bld gHb Est-mCnc 177     Assessment & Plan     1. Type 2 diabetes mellitus with hyperglycemia, without long-term current use of insulin (HCC) Improving.  Chief Complaint  Patient presents with  . Diabetes  . Hypertension  .  - Comprehensive metabolic panel - Lipid panel  2. Prescription for Shingrix. Vaccine not administered in office.   - Zoster Vaccine Adjuvanted [facility]) injection; Inject 0.5 mLs into the muscle once for 1 dose.  Dispense: 0.5 mL; Refill: 0  3. Prescription for Tdap. Vaccine not  administered in office.   - Tdap ([place]) 5-2.5-18.5 LF-MCG/0.5 injection; Inject 0.5 mLs into the muscle once for 1 dose.  Dispense: 0.5 mL; Refill: 0  4. Hypothyroidism, unspecified type  - T4, free - TSH  5. Aortic atherosclerosis (HCC) Asymptomatic. Compliant with medication.  Continue aggressive risk factor modification.    6. Osteoporosis, unspecified osteoporosis type, unspecified pathological fracture presence  - VITAMIN D 25 Hydroxy (Vit-D Deficiency, Fractures)  7. B12 deficiency  - Vitamin B12  8. Primary hypertension Doing well current medication. DBP elevated but pulse pressure is very narro.   9. Iron deficiency anemia, unspecified iron deficiency anemia type  - CBC    Future Appointments  Date Time Provider Chambers  07/31/2020  1:30 PM Gae Dry, MD [facility] None  09/19/2020 11:00 AM Birdie Sons, MD [facility]  04/29/2021  2:40 PM [facility]-NURSE HEALTH ADVISOR [facility]        The entirety of the information documented in the History of Present Illness, Review of Systems and Physical Exam were personally obtained by me. Portions of this information were initially documented by the CMA and reviewed by me for thoroughness and accuracy.      [name], MD  [facility] [phone] (phone) [phone] (fax)  [place]

## 2020-05-16 NOTE — Patient Instructions (Addendum)
   The CDC recommends two doses of Shingrix (the shingles vaccine) separated by 2 to 6 months for adults age 80 years and older. I recommend checking with your pharmacy plan regarding coverage for this vaccine.     . You are due for a Tdap (tetanus-diptheria-pertussis vaccine) which protects you from tetanus and whooping cough. Please check with your insurance plan or pharmacy regarding coverage for this vaccine.

## 2020-05-17 LAB — CBC
Hematocrit: 37.5 % (ref 34.0–46.6)
Hemoglobin: 12.8 g/dL (ref 11.1–15.9)
MCH: 28 pg (ref 26.6–33.0)
MCHC: 34.1 g/dL (ref 31.5–35.7)
MCV: 82 fL (ref 79–97)
Platelets: 353 10*3/uL (ref 150–450)
RBC: 4.57 x10E6/uL (ref 3.77–5.28)
RDW: 13 % (ref 11.7–15.4)
WBC: 10 10*3/uL (ref 3.4–10.8)

## 2020-05-17 LAB — COMPREHENSIVE METABOLIC PANEL
ALT: 20 IU/L (ref 0–32)
AST: 21 IU/L (ref 0–40)
Albumin/Globulin Ratio: 1.7 (ref 1.2–2.2)
Albumin: 4.6 g/dL (ref 3.7–4.7)
Alkaline Phosphatase: 92 IU/L (ref 44–121)
BUN/Creatinine Ratio: 13 (ref 12–28)
BUN: 9 mg/dL (ref 8–27)
Bilirubin Total: 0.3 mg/dL (ref 0.0–1.2)
CO2: 23 mmol/L (ref 20–29)
Calcium: 9.6 mg/dL (ref 8.7–10.3)
Chloride: 96 mmol/L (ref 96–106)
Creatinine, Ser: 0.71 mg/dL (ref 0.57–1.00)
Globulin, Total: 2.7 g/dL (ref 1.5–4.5)
Glucose: 129 mg/dL — ABNORMAL HIGH (ref 65–99)
Potassium: 3.9 mmol/L (ref 3.5–5.2)
Sodium: 139 mmol/L (ref 134–144)
Total Protein: 7.3 g/dL (ref 6.0–8.5)
eGFR: 86 mL/min/{1.73_m2} (ref >59)

## 2020-05-17 LAB — T4, FREE: Free T4: 1.36 ng/dL (ref 0.82–1.77)

## 2020-05-17 LAB — LIPID PANEL
Chol/HDL Ratio: 3.2 ratio (ref 0.0–4.4)
Cholesterol, Total: 158 mg/dL (ref 100–199)
HDL: 50 mg/dL (ref >39)
LDL Chol Calc (NIH): 69 mg/dL (ref 0–99)
Triglycerides: 244 mg/dL — ABNORMAL HIGH (ref 0–149)
VLDL Cholesterol Cal: 39 mg/dL (ref 5–40)

## 2020-05-17 LAB — VITAMIN D 25 HYDROXY (VIT D DEFICIENCY, FRACTURES): Vit D, 25-Hydroxy: 87.3 ng/mL (ref 30.0–100.0)

## 2020-05-17 LAB — VITAMIN B12: Vitamin B-12: 497 pg/mL (ref 232–1245)

## 2020-05-17 LAB — TSH: TSH: 1.54 u[IU]/mL (ref 0.450–4.500)

## 2020-05-19 ENCOUNTER — Telehealth: Payer: Self-pay

## 2020-05-19 NOTE — Telephone Encounter (Signed)
Patient was advised and verbalized understanding. 

## 2020-05-19 NOTE — Telephone Encounter (Signed)
Correction, her vitamin d level is good. She can continue taking 5000 units daily.

## 2020-05-19 NOTE — Telephone Encounter (Signed)
-----   Message from Birdie Sons, MD sent at 05/19/2020  6:45 AM EDT ----- Vitamin d levels are low. She needs to double the dose of vitamin d that she is taking. Rest  of labs are good. Continue current medications.  Follow up in July as scheduled.

## 2020-05-19 NOTE — Telephone Encounter (Signed)
Patient was advised and states she takes 5,000 units if Vitamin D now alone with Vitamin D in her Calcium. She wants to clarify that she needs to increase the medication or decrease the medication since her Vitamin D levels are low now. Please advise.

## 2020-05-22 ENCOUNTER — Other Ambulatory Visit: Payer: Self-pay | Admitting: Family Medicine

## 2020-06-02 DIAGNOSIS — H353211 Exudative age-related macular degeneration, right eye, with active choroidal neovascularization: Secondary | ICD-10-CM | POA: Diagnosis not present

## 2020-06-02 DIAGNOSIS — H353221 Exudative age-related macular degeneration, left eye, with active choroidal neovascularization: Secondary | ICD-10-CM | POA: Diagnosis not present

## 2020-06-07 ENCOUNTER — Emergency Department: Payer: PPO

## 2020-06-07 ENCOUNTER — Other Ambulatory Visit: Payer: Self-pay

## 2020-06-07 ENCOUNTER — Emergency Department
Admission: EM | Admit: 2020-06-07 | Discharge: 2020-06-07 | Disposition: A | Discharge status: Home / Self Care | Payer: PPO | Attending: Emergency Medicine | Admitting: Emergency Medicine

## 2020-06-07 DIAGNOSIS — M79605 Pain in left leg: Secondary | ICD-10-CM | POA: Diagnosis not present

## 2020-06-07 DIAGNOSIS — I10 Essential (primary) hypertension: Secondary | ICD-10-CM | POA: Diagnosis not present

## 2020-06-07 DIAGNOSIS — M79662 Pain in left lower leg: Secondary | ICD-10-CM | POA: Diagnosis not present

## 2020-06-07 DIAGNOSIS — I2510 Atherosclerotic heart disease of native coronary artery without angina pectoris: Secondary | ICD-10-CM | POA: Diagnosis not present

## 2020-06-07 DIAGNOSIS — Z7984 Long term (current) use of oral hypoglycemic drugs: Secondary | ICD-10-CM | POA: Diagnosis not present

## 2020-06-07 DIAGNOSIS — Z87891 Personal history of nicotine dependence: Secondary | ICD-10-CM | POA: Diagnosis not present

## 2020-06-07 DIAGNOSIS — E039 Hypothyroidism, unspecified: Secondary | ICD-10-CM | POA: Diagnosis not present

## 2020-06-07 DIAGNOSIS — E119 Type 2 diabetes mellitus without complications: Secondary | ICD-10-CM | POA: Diagnosis not present

## 2020-06-07 DIAGNOSIS — Z85820 Personal history of malignant melanoma of skin: Secondary | ICD-10-CM | POA: Diagnosis not present

## 2020-06-07 DIAGNOSIS — Z7902 Long term (current) use of antithrombotics/antiplatelets: Secondary | ICD-10-CM | POA: Diagnosis not present

## 2020-06-07 DIAGNOSIS — Z7982 Long term (current) use of aspirin: Secondary | ICD-10-CM | POA: Diagnosis not present

## 2020-06-07 DIAGNOSIS — Z8673 Personal history of transient ischemic attack (TIA), and cerebral infarction without residual deficits: Secondary | ICD-10-CM | POA: Insufficient documentation

## 2020-06-07 DIAGNOSIS — M25562 Pain in left knee: Secondary | ICD-10-CM | POA: Diagnosis not present

## 2020-06-07 LAB — COMPREHENSIVE METABOLIC PANEL
ALT: 23 U/L (ref 0–44)
AST: 29 U/L (ref 15–41)
Albumin: 4.1 g/dL (ref 3.5–5.0)
Alkaline Phosphatase: 74 U/L (ref 38–126)
Anion gap: 11 (ref 5–15)
BUN: 13 mg/dL (ref 8–23)
CO2: 25 mmol/L (ref 22–32)
Calcium: 9.5 mg/dL (ref 8.9–10.3)
Chloride: 98 mmol/L (ref 98–111)
Creatinine, Ser: 0.76 mg/dL (ref 0.44–1.00)
GFR, Estimated: >60 mL/min (ref >60)
Glucose, Bld: 152 mg/dL — ABNORMAL HIGH (ref 70–99)
Potassium: 3.6 mmol/L (ref 3.5–5.1)
Sodium: 134 mmol/L — ABNORMAL LOW (ref 135–145)
Total Bilirubin: 0.5 mg/dL (ref 0.3–1.2)
Total Protein: 7.6 g/dL (ref 6.5–8.1)

## 2020-06-07 LAB — CBC
HCT: 37.4 % (ref 36.0–46.0)
Hemoglobin: 12.5 g/dL (ref 12.0–15.0)
MCH: 28.3 pg (ref 26.0–34.0)
MCHC: 33.4 g/dL (ref 30.0–36.0)
MCV: 84.6 fL (ref 80.0–100.0)
Platelets: 370 10*3/uL (ref 150–400)
RBC: 4.42 MIL/uL (ref 3.87–5.11)
RDW: 12.8 % (ref 11.5–15.5)
WBC: 11.4 10*3/uL — ABNORMAL HIGH (ref 4.0–10.5)
nRBC: 0 % (ref 0.0–0.2)

## 2020-06-07 LAB — CK: Total CK: 47 U/L (ref 38–234)

## 2020-06-07 MED ORDER — ACETAMINOPHEN 325 MG PO TABS
650.0000 mg | ORAL_TABLET | Freq: Once | ORAL | Status: AC
  Administered 2020-06-07: 650 mg via ORAL
  Filled 2020-06-07: qty 2

## 2020-06-07 NOTE — ED Notes (Signed)
Pt presents with left knee and calf pain that started yesterday, she denies falls or injuries. Pt reports she is still able to ambulate but it is very painful. She has taken tylenol OTC with "not much relief". Dorsalis pedis pulses 2+ bilaterally.

## 2020-06-07 NOTE — ED Triage Notes (Signed)
Pt comes pov with left leg pain behind the knee for a few days with no injury. No obvious swelling or deformity to leg. States increasing pain with walking. No hx of DVT. Pt is on plavix.

## 2020-06-07 NOTE — ED Provider Notes (Signed)
[facility] Emergency Department Provider Note  ____________________________________________   Event Date/Time   First MD Initiated Contact with Patient 06/07/20 1605     (approximate)  I have reviewed the triage vital signs and the nursing notes.   HISTORY  Chief Complaint Leg Pain   HPI April Pittman is a 80 y.o. female who presents to the emergency department for evaluation of left leg pain.  She reports that pain began from behind the knee a few days ago.  She denies any falls, injury or other mechanism.  She notes that the pain began after she had been resting on her couch few days after getting some injections in her eyes.  She states that the pain has radiated from the back of the knee down into the mid calf and up the back of her thigh.  She denies any associated back pain, numbness or tingling.  She reports that she has been able to walk, but is now using her cane and walker at home due to increased pain with ambulation.  She denies any fevers, swelling to the calf, denies any history of DVTs.  She is on a daily Plavix but is not on any other thinners.  She has attempted Tylenol with minimal relief.         Past Medical History:  Diagnosis Date  . Alopecia   . Anginal pain ([place])   . Benign neoplasm of large bowel   . CAD (coronary artery disease)   . Carpal tunnel syndrome   . Complication of anesthesia    vomits with any pain medicine  . DM (diabetes mellitus) ([place])   . Erosion of cervix   . GERD (gastroesophageal reflux disease)   . H/O adenomatous polyp of colon    with sever dysplasia, removed 2000 by colonoscopy  . History of chicken pox   . Hypercholesteremia   . Hypertension   . Hypothyroid   . Irritable bowel   . Malignant melanoma of foot ([place])   . Osteoporosis   . PONV (postoperative nausea and vomiting)   . Post-menopausal bleeding   . Procidentia of uterus    with traction cystocele, managed with gellhorn pessary  .  Rectal bleeding   . Sepsis ([place]) 02/18/2018  . TIA (transient ischemic attack)     Patient Active Problem List   Diagnosis Date Noted  . Cystocele with prolapse 02/04/2020  . PAC (premature atrial contraction) 05/11/2019  . Short PR-normal QRS complex syndrome 05/11/2019  . Right bundle branch block 05/11/2019  . Aortic atherosclerosis ([place]) 01/05/2019  . Thrombocytosis 04/14/2018  . Borderline blood pressure 04/14/2018  . Hiatal hernia 03/24/2018  . Cholelithiases 03/24/2018  . Iron deficiency anemia 03/04/2018  . Cataract cortical, senile, bilateral 05/11/2017  . Primary osteoarthritis of right knee 11/02/2015  . Vitamin D deficiency 08/14/2014  . Rectal/anal hemorrhage 06/14/2014  . Hypothyroidism 06/14/2014  . Hypertension 06/14/2014  . Hypercholesteremia 06/14/2014  . TIA (transient ischemic attack) 06/14/2014  . Gastroesophageal reflux disease 06/14/2014  . Alopecia 06/14/2014  . Malignant melanoma of foot ([place]) 06/14/2014  . Irritable bowel 06/14/2014  . Coronary artery disease 06/14/2014  . Type 2 diabetes mellitus with hyperglycemia, without long-term current use of insulin ([place]) 06/14/2014  . Carpal tunnel syndrome 06/14/2014  . Procidentia of uterus 06/14/2014  . Osteoporosis 06/14/2014  . History of adenomatous polyp of colon 06/14/2014  . B12 deficiency 08/08/2008  . Colon, diverticulosis 05/14/2008    Past Surgical History:  Procedure Laterality  Date  . BREAST EXCISIONAL BIOPSY Right    Benign  . CARDIAC CATHETERIZATION  10/2010   +stenting to LAD and RCA; symptoms: chest pain, elevated Troponin, +AMI. [employer]  . CATARACT EXTRACTION W/PHACO Right 07/28/2016   Procedure: CATARACT EXTRACTION PHACO AND INTRAOCULAR LENS PLACEMENT ([place])  Right Diabetic;  Surgeon: Leandrew Koyanagi, MD;  Location: [place] [facility];  Service: Ophthalmology;  Laterality: Right;  Diabetic leave patient at 9 arrival  . CATARACT EXTRACTION W/PHACO Left 10/27/2016    Procedure: CATARACT EXTRACTION PHACO AND INTRAOCULAR LENS PLACEMENT ([place]) LEFT DIABETIC;  Surgeon: Leandrew Koyanagi, MD;  Location: [place];  Service: Ophthalmology;  Laterality: Left;  . COLONOSCOPY  03/2008   2 polyps, internal and external hemorrhoids, +anal growth. Dr. Vira Agar  . COLONOSCOPY WITH PROPOFOL N/A 05/12/2018   Procedure: COLONOSCOPY WITH PROPOFOL;  Surgeon: Manya Silvas, MD;  Location: [facility] ENDOSCOPY;  Service: Endoscopy;  Laterality: N/A;  . ESOPHAGOGASTRODUODENOSCOPY (EGD) WITH PROPOFOL N/A 05/12/2018   Procedure: ESOPHAGOGASTRODUODENOSCOPY (EGD) WITH PROPOFOL;  Surgeon: Manya Silvas, MD;  Location: [facility] ENDOSCOPY;  Service: Endoscopy;  Laterality: N/A;  . KNEE SURGERY    . TONSILLECTOMY     as a child    Prior to Admission medications   Medication Sig Start Date End Date Taking? Authorizing Provider  aspirin EC 81 MG tablet Take 81 mg by mouth daily.    [provider]  calcium carbonate (OS-CAL) 1250 (500 Ca) MG chewable tablet Chew 1 tablet by mouth daily.     [provider]  cholecalciferol (VITAMIN D) 25 MCG (1000 UNIT) tablet Take 5,000 Units by mouth daily.    [provider]  clopidogrel (PLAVIX) 75 MG tablet Take 1 tablet (75 mg total) by mouth daily. 01/29/20   Birdie Sons, MD  estradiol (ESTRACE) 0.1 MG/GM vaginal cream Place 1 Applicatorful vaginally once a week. 03/20/14   [provider]  levothyroxine (SYNTHROID) 125 MCG tablet TAKE 1 TABLET BY MOUTH IN THE MORNING ON AN EMPTY STOMACH FOR THYROID 02/20/20   Birdie Sons, MD  lisinopril-hydrochlorothiazide (ZESTORETIC) 20-12.5 MG tablet TAKE 1 TABLET BY MOUTH EVERY DAY 05/22/20   Birdie Sons, MD  metFORMIN (GLUCOPHAGE) 1000 MG tablet TAKE 1 TABLET (1,000 MG TOTAL) BY MOUTH 2 (TWO) TIMES DAILY. TAKE WITH FOOD 02/20/20   Birdie Sons, MD  metoprolol tartrate (LOPRESSOR) 50 MG tablet Take 1 tablet (50 mg total) by mouth 2 (two) times daily.  01/29/20   Birdie Sons, MD  nitroGLYCERIN (NITROSTAT) 0.4 MG SL tablet Place 1 tablet (0.4 mg total) under the tongue every 5 (five) minutes as needed for chest pain. 09/29/15   Birdie Sons, MD  [facility] ULTRA test strip USE TO CHECK BLOOD SUGAR DAILY. E 11.9 (TYPE 2 DIABETES MELLITIS) 02/12/20   Birdie Sons, MD  OXYQUINOLONE SULFATE VAGINAL (TRIMO-SAN) 0.025 % GEL Place 1 Applicatorful vaginally every 30 (thirty) days. 12/05/18   Gae Dry, MD  pantoprazole (PROTONIX) 40 MG tablet Take by mouth. 03/09/18   [provider]  simvastatin (ZOCOR) 40 MG tablet TAKE 1 TABLET BY MOUTH EVERY DAY FOR CHOLESTEROL 08/03/19   Birdie Sons, MD  triamcinolone cream (KENALOG) 0.1 % Apply 1 application topically 2 (two) times daily. 11/27/18   Birdie Sons, MD  vitamin B-12 (CYANOCOBALAMIN) 1000 MCG tablet Take 1,000 mcg by mouth daily.     [provider]    Allergies Patient has no known allergies.  Family History  Problem Relation Age of Onset  . Lung cancer Brother   . Diabetes Father   . Heart disease Sister        Valvular rheumatic heart disease  . Rheumatic fever Sister   . Heart disease Paternal Grandmother   . Breast cancer Neg Hx   . Ovarian cancer Neg Hx   . Colon cancer Neg Hx     Social History Social History   Tobacco Use  . Smoking status: Former Smoker    Quit date: 03/03/1988    Years since quitting: 32.2  . Smokeless tobacco: Never Used  Vaping Use  . Vaping Use: Never used  Substance Use Topics  . Alcohol use: No    Alcohol/week: 0.0 standard drinks  . Drug use: No    Review of Systems Constitutional: No fever/chills Eyes: No visual changes. ENT: No sore throat. Cardiovascular: Denies chest pain. Respiratory: Denies shortness of breath. Gastrointestinal: No abdominal pain.  No nausea, no vomiting.  No diarrhea.  No constipation. Genitourinary: Negative for dysuria. Musculoskeletal: + Left leg pain, negative for back  pain. Skin: Negative for rash. Neurological: Negative for headaches, focal weakness or numbness.  ____________________________________________   PHYSICAL EXAM:  VITAL SIGNS: ED Triage Vitals [06/07/20 1609]  Enc Vitals Group     BP (!) 151/72     Pulse Rate 74     Resp 18     Temp 98.4 F (36.9 C)     Temp Source Oral     SpO2 96 %     Weight 149 lb (67.6 kg)     Height 5\' 3"  (1.6 m)     Head Circumference      Peak Flow      Pain Score 10     Pain Loc      Pain Edu?      Excl. in [place]?    Constitutional: Alert and oriented. Well appearing and in no acute distress. Eyes: Conjunctivae are normal. PERRL. EOMI. Head: Atraumatic. Nose: No congestion/rhinnorhea. Mouth/Throat: Mucous membranes are moist.  Neck: No stridor.   Cardiovascular: Normal rate, regular rhythm. Grossly normal heart sounds.  Good peripheral circulation. Respiratory: Normal respiratory effort.  No retractions. Lungs CTAB. Gastrointestinal: Soft and nontender. No distention. No abdominal bruits. No CVA tenderness. Musculoskeletal: There is appropriate color to the bilateral lower extremities.  No swelling or deformity appreciated.  There is tenderness to palpation in the proximal calf muscle into the posterior knee and distal thigh.  She has full range of motion of the knee and ankle without difficulty.  5/5 strength in the bilateral lower extremities in ankle plantarflexion and dorsiflexion, knee flexion and extension.  Dorsal pedal pulses are 2+, equal bilaterally.  Posterior tibial pulses 2+, equal bilaterally.  Neurologic:  Normal speech and language. No gross focal neurologic deficits are appreciated.  Gait not assessed secondary left lower extremity pain. Skin:  Skin is warm, dry and intact. No rash noted. Psychiatric: Mood and affect are normal. Speech and behavior are normal.  ____________________________________________   LABS (all labs ordered are listed, but only abnormal results are  displayed)  Labs Reviewed  CBC - Abnormal; Notable for the following components:      Result Value   WBC 11.4 (*)    All other components within normal limits  COMPREHENSIVE METABOLIC PANEL - Abnormal; Notable for the following components:   Sodium 134 (*)    Glucose, Bld 152 (*)    All other components within normal limits  CK  ____________________________________________  RADIOLOGY Lenoria Farrier, personally viewed and evaluated these images (plain radiographs) as part of my medical decision making, as well as reviewing the written report by the radiologist.  ED provider interpretation: X-ray left knee reveals mild joint space narrowing but no significant deformity, no acute process  Official radiology report(s): US Venous Img Lower Unilateral Left  Result Date: 06/07/2020 CLINICAL DATA:  Left leg pain EXAM: LEFT LOWER EXTREMITY VENOUS DOPPLER ULTRASOUND TECHNIQUE: Gray-scale sonography with graded compression, as well as color Doppler and duplex ultrasound were performed to evaluate the lower extremity deep venous systems from the level of the common femoral vein and including the common femoral, femoral, profunda femoral, popliteal and calf veins including the posterior tibial, peroneal and gastrocnemius veins when visible. The superficial great saphenous vein was also interrogated. Spectral Doppler was utilized to evaluate flow at rest and with distal augmentation maneuvers in the common femoral, femoral and popliteal veins. COMPARISON:  None. FINDINGS: Contralateral Common Femoral Vein: Respiratory phasicity is normal and symmetric with the symptomatic side. No evidence of thrombus. Normal compressibility. Common Femoral Vein: No evidence of thrombus. Normal compressibility, respiratory phasicity and response to augmentation. Saphenofemoral Junction: No evidence of thrombus. Normal compressibility and flow on color Doppler imaging. Profunda Femoral Vein: No evidence of thrombus.  Normal compressibility and flow on color Doppler imaging. Femoral Vein: No evidence of thrombus. Normal compressibility, respiratory phasicity and response to augmentation. Popliteal Vein: No evidence of thrombus. Normal compressibility, respiratory phasicity and response to augmentation. Calf Veins: No evidence of thrombus. Normal compressibility and flow on color Doppler imaging. Superficial Great Saphenous Vein: No evidence of thrombus. Normal compressibility. Venous Reflux:  None. Other Findings:  None. IMPRESSION: No evidence of deep venous thrombosis. Electronically Signed   By: Inez Catalina M.D.   On: 06/07/2020 17:25   DG Knee Complete 4 Views Left  Result Date: 06/07/2020 CLINICAL DATA:  Left knee pain for 2 days, no known injury, initial encounter EXAM: LEFT KNEE - COMPLETE 4+ VIEW COMPARISON:  None. FINDINGS: Mild medial joint space narrowing is noted. No joint effusion is seen. No acute fracture or dislocation is noted. IMPRESSION: Mild degenerative change without acute abnormality. Electronically Signed   By: Inez Catalina M.D.   On: 06/07/2020 17:08   ____________________________________________   INITIAL IMPRESSION / ASSESSMENT AND PLAN / ED COURSE  As part of my medical decision making, I reviewed the following data within the [place] notes reviewed and incorporated, Labs reviewed, Radiograph reviewed and Notes from prior ED visits        Patient is a 80 year old female who presents to the emergency department for evaluation of left leg pain has been present over the last few days after an increased amount of rest on her couch and with no specific injury mechanism.  See HPI for further details.  In triage, the patient is mildly hypertensive but otherwise has normal vital signs.  On physical exam, patient does have tenderness to the posterior calf and back of the knee as well as the distal thigh, but she does not have any change in color of the extremity and  there is no erythema or swelling to the calf.  X-rays were obtained of the knee which demonstrate minimal joint space loss with no acute process.  Ultrasound imaging was obtained to rule out DVT and is negative.  Laboratory evaluation was obtained including a CBC, CMP and CK.  Patient does have a very mild elevation in her  white blood cell count, as well as very mild decrease in her sodium, otherwise labs are grossly normal.  Discussed with the patient likelihood for musculoskeletal cause of her pain at this time.  Recommended taking Tylenol on a scheduled basis.  Patient is amenable with this plan, and agrees to have close PCP follow-up.  Return precautions were discussed at length and she stable this time for outpatient follow-up.      ____________________________________________   FINAL CLINICAL IMPRESSION(S) / ED DIAGNOSES  Final diagnoses:  Left leg pain     ED Discharge Orders    None      *Please note:  April Pittman was evaluated in Emergency Department on 06/07/2020 for the symptoms described in the history of present illness. She was evaluated in the context of the global COVID-19 pandemic, which necessitated consideration that the patient might be at risk for infection with the SARS-CoV-2 virus that causes COVID-19. Institutional protocols and algorithms that pertain to the evaluation of patients at risk for COVID-19 are in a state of rapid change based on information released by regulatory bodies including the CDC and federal and state organizations. These policies and algorithms were followed during the patient's care in the ED.  Some ED evaluations and interventions may be delayed as a result of limited staffing during and the pandemic.*   Note:  This document was prepared using Dragon voice recognition software and may include unintentional dictation errors.   Marlana Salvage, PA 06/07/20 Octavia Heir    Arta Silence, MD 06/08/20 Benancio Deeds

## 2020-06-07 NOTE — Discharge Instructions (Addendum)
Please use ice and/or Tylenol, 650 mg up to 4 times daily as needed for pain.  Please follow-up with your primary care if symptoms or not improving.  Return to the emergency department with any worsening of symptoms, including worsening pain, redness, warmth or other changes.

## 2020-06-07 NOTE — ED Notes (Signed)
See triage note. No obvious swelling to L leg/knee area. Pt able to move L leg and denies numbness. L leg appropriate color and warmth. Bed locked low. Rail up.

## 2020-07-24 DIAGNOSIS — E7800 Pure hypercholesterolemia, unspecified: Secondary | ICD-10-CM | POA: Diagnosis not present

## 2020-07-24 DIAGNOSIS — I4510 Unspecified right bundle-branch block: Secondary | ICD-10-CM | POA: Diagnosis not present

## 2020-07-24 DIAGNOSIS — I491 Atrial premature depolarization: Secondary | ICD-10-CM | POA: Diagnosis not present

## 2020-07-24 DIAGNOSIS — I456 Pre-excitation syndrome: Secondary | ICD-10-CM | POA: Diagnosis not present

## 2020-07-24 DIAGNOSIS — I700 Atherosclerosis of aorta: Secondary | ICD-10-CM | POA: Diagnosis not present

## 2020-07-24 DIAGNOSIS — I2510 Atherosclerotic heart disease of native coronary artery without angina pectoris: Secondary | ICD-10-CM | POA: Diagnosis not present

## 2020-07-24 DIAGNOSIS — E119 Type 2 diabetes mellitus without complications: Secondary | ICD-10-CM | POA: Diagnosis not present

## 2020-07-24 DIAGNOSIS — I10 Essential (primary) hypertension: Secondary | ICD-10-CM | POA: Diagnosis not present

## 2020-07-31 ENCOUNTER — Ambulatory Visit: Payer: PPO | Admitting: Obstetrics & Gynecology

## 2020-08-18 DIAGNOSIS — H353221 Exudative age-related macular degeneration, left eye, with active choroidal neovascularization: Secondary | ICD-10-CM | POA: Diagnosis not present

## 2020-08-18 DIAGNOSIS — H353211 Exudative age-related macular degeneration, right eye, with active choroidal neovascularization: Secondary | ICD-10-CM | POA: Diagnosis not present

## 2020-09-16 ENCOUNTER — Other Ambulatory Visit: Payer: Self-pay

## 2020-09-16 ENCOUNTER — Encounter: Payer: Self-pay | Admitting: Obstetrics & Gynecology

## 2020-09-16 ENCOUNTER — Ambulatory Visit: Payer: PPO | Admitting: Obstetrics & Gynecology

## 2020-09-16 VITALS — BP 120/80 | Ht 63.5 in | Wt 136.0 lb

## 2020-09-16 DIAGNOSIS — N813 Complete uterovaginal prolapse: Secondary | ICD-10-CM

## 2020-09-16 DIAGNOSIS — N814 Uterovaginal prolapse, unspecified: Secondary | ICD-10-CM | POA: Diagnosis not present

## 2020-09-16 NOTE — Progress Notes (Signed)
HPI:      [name] is a 80 y.o. [id number] who presents today for her pessary follow up and examination related to her pelvic floor weakening.  Pt reports tolerating the pessary well with no vaginal bleeding and no vaginal discharge.  Symptoms of pelvic floor weakening have greatly improved. She is voiding and defecating without difficulty. She currently has a Gellhorn #3 pessary.  PMHx: She  has a past medical history of Alopecia, Anginal pain ([place]), Benign neoplasm of large bowel, CAD (coronary artery disease), Carpal tunnel syndrome, Complication of anesthesia, DM (diabetes mellitus) ([place]), Erosion of cervix, GERD (gastroesophageal reflux disease), H/O adenomatous polyp of colon, History of chicken pox, Hypercholesteremia, Hypertension, Hypothyroid, Irritable bowel, Malignant melanoma of foot ([place]), Osteoporosis, PONV (postoperative nausea and vomiting), Post-menopausal bleeding, Procidentia of uterus, Rectal bleeding, Sepsis ([place]) (02/18/2018), and TIA (transient ischemic attack). Also,  has a past surgical history that includes Tonsillectomy; Knee surgery; Cardiac catheterization (10/2010); Colonoscopy (03/2008); Breast excisional biopsy (Right); Cataract extraction w/PHACO (Right, 07/28/2016); Cataract extraction w/PHACO (Left, 10/27/2016); Esophagogastroduodenoscopy (egd) with propofol (N/A, 05/12/2018); and Colonoscopy with propofol (N/A, 05/12/2018)., family history includes Diabetes in her father; Heart disease in her paternal grandmother and sister; Lung cancer in her brother; Rheumatic fever in her sister.,  reports that she quit smoking about 32 years ago. She has never used smokeless tobacco. She reports that she does not drink alcohol and does not use drugs.  She has a current medication list which includes the following prescription(s): aspirin ec, calcium carbonate, cholecalciferol, clopidogrel, estradiol, levothyroxine, lisinopril-hydrochlorothiazide, metformin, metoprolol tartrate,  nitroglycerin, onetouch ultra, trimo-san, pantoprazole, simvastatin, triamcinolone cream, and vitamin b-12. Also, has No Known Allergies.  Review of Systems  All other systems reviewed and are negative.  Objective: BP 120/80   Ht 5' 3.5" (1.613 m)   Wt 136 lb (61.7 kg)   BMI 23.71 kg/m  Physical Exam Constitutional:      General: She is not in acute distress.    Appearance: She is well-developed.  Genitourinary:     Bladder normal.     Right Labia: No rash or tenderness.    Left Labia: No tenderness or rash.    No vaginal erythema or bleeding.      Right Adnexa: not tender and no mass present.    Left Adnexa: not tender and no mass present.    Cervical polyp present.     No cervical motion tenderness, discharge or nabothian cyst.     Uterus is prolapsed.     Uterus is not enlarged.     No uterine mass detected.    Bladder exam comments: cystocele.     Pelvic exam was performed with patient in the lithotomy position.  HENT:     Head: Normocephalic and atraumatic.     Nose: Nose normal.  Abdominal:     General: There is no distension.     Palpations: Abdomen is soft.     Tenderness: There is no abdominal tenderness.  Musculoskeletal:        General: Normal range of motion.  Neurological:     Mental Status: She is alert and oriented to person, place, and time.     Cranial Nerves: No cranial nerve deficit.  Skin:    General: Skin is warm and dry.  Psychiatric:        Attention and Perception: Attention normal.        Mood and Affect: Mood and affect normal.  Speech: Speech normal.        Behavior: Behavior normal.        Thought Content: Thought content normal.        Judgment: Judgment normal.    Pessary Care Pessary removed and cleaned.  Vagina checked - without erosions - pessary replaced.  A/P: 1. Procidentia of uterus 2. Cystocele with prolapse Pessary was cleaned and replaced today. Instructions given for care. Concerning symptoms to observe for  are counseled to patient. Follow up scheduled for 3 months.   A total of 22 minutes were spent face-to-face with the patient as well as preparation, review, communication, and documentation during this encounter.   Barnett Applebaum, MD, [facility], [employer] [facility] [facility] 09/16/2020  1:54 PM

## 2020-09-19 ENCOUNTER — Other Ambulatory Visit: Payer: Self-pay

## 2020-09-19 ENCOUNTER — Encounter: Payer: Self-pay | Admitting: Family Medicine

## 2020-09-19 ENCOUNTER — Ambulatory Visit (INDEPENDENT_AMBULATORY_CARE_PROVIDER_SITE_OTHER): Payer: PPO | Admitting: Family Medicine

## 2020-09-19 VITALS — BP 114/64 | HR 76 | Temp 97.6°F | Resp 18 | Ht 64.0 in | Wt 135.0 lb

## 2020-09-19 DIAGNOSIS — E039 Hypothyroidism, unspecified: Secondary | ICD-10-CM

## 2020-09-19 DIAGNOSIS — E1165 Type 2 diabetes mellitus with hyperglycemia: Secondary | ICD-10-CM | POA: Diagnosis not present

## 2020-09-19 DIAGNOSIS — E559 Vitamin D deficiency, unspecified: Secondary | ICD-10-CM

## 2020-09-19 DIAGNOSIS — I10 Essential (primary) hypertension: Secondary | ICD-10-CM

## 2020-09-19 LAB — POCT GLYCOSYLATED HEMOGLOBIN (HGB A1C)
Est. average glucose Bld gHb Est-mCnc: 171
Hemoglobin A1C: 7.6 % — AB (ref 4.0–5.6)

## 2020-09-19 NOTE — Progress Notes (Signed)
I,April Miller,acting as a scribe for [name], MD.,have documented all relevant documentation on the behalf of [name], MD,as directed by  [name], MD while in the presence of [name], MD.  Established patient visit   Patient: April Pittman   DOB: 1940-05-05   80 y.o. Female  MRN: [id number] Visit Date: 09/19/2020  Today's healthcare provider: [name], MD   Chief Complaint  Patient presents with   Follow-up   Diabetes   Hypertension   Hypothyroidism   Subjective    HPI  Diabetes Mellitus Type II, follow-up  Lab Results  Component Value Date   HGBA1C 7.6 (A) 09/19/2020   HGBA1C 7.8 (A) 05/16/2020   HGBA1C 8.3 (A) 01/15/2020   Last seen for diabetes 4 months ago.  Management since then includes continuing the same treatment. She reports good compliance with treatment. She is not having side effects. none  Home blood sugar records: fasting range: not checking  Episodes of hypoglycemia? No none   Current insulin regiment: n/a Most Recent Eye Exam: 12/31/2019  ----------------------------------------------------------------------------  Hypertension, follow-up  BP Readings from Last 3 Encounters:  09/19/20 99/63  09/16/20 120/80  06/07/20 (!) 167/83   Wt Readings from Last 3 Encounters:  09/19/20 135 lb (61.2 kg)  09/16/20 136 lb (61.7 kg)  06/07/20 149 lb (67.6 kg)     She was last seen for hypertension 4 months ago.  BP at that visit was 119/95. Management since that visit includes; Doing well current medication. DBP elevated but pulse pressure is very narro.  She reports good compliance with treatment. She is not having side effects. none She is not exercising. She is adherent to low salt diet.   Outside blood pressures are 130/70  She does not smoke.  Use of agents associated with hypertension: none.   ----------------------------------------------------------------------------  Hypothyroid, follow-up  Lab  Results  Component Value Date   TSH 1.540 05/16/2020   TSH 0.536 05/11/2019   TSH 4.870 (H) 02/24/2018   FREET4 1.36 05/16/2020   T4TOTAL 9.8 04/28/2016   T4TOTAL 8.8 09/29/2015   Wt Readings from Last 3 Encounters:  09/19/20 135 lb (61.2 kg)  09/16/20 136 lb (61.7 kg)  06/07/20 149 lb (67.6 kg)    She was last seen for hypothyroid 4 months ago.  Management since that visit includes; labs checked showing-no changes. She reports good compliance with treatment. She is not having side effects. none  ----------------------------------------------------------------------------  Follow up for Vitamin D Deficiency   The patient was last seen for this 4 months ago.  She reports good compliance with treatment. She feels that condition is Unchanged. She is not having side effects. none Lab Results  Component Value Date   VD25OH 87.3 05/16/2020    ----------------------------------------------------------------------------      Medications: Outpatient Medications Prior to Visit  Medication Sig   aspirin EC 81 MG tablet Take 81 mg by mouth daily.   calcium carbonate (OS-CAL) 1250 (500 Ca) MG chewable tablet Chew 1 tablet by mouth daily.    cholecalciferol (VITAMIN D) 25 MCG (1000 UNIT) tablet Take 5,000 Units by mouth daily.   clopidogrel (PLAVIX) 75 MG tablet Take 1 tablet (75 mg total) by mouth daily.   estradiol (ESTRACE) 0.1 MG/GM vaginal cream Place 1 Applicatorful vaginally once a week.   levothyroxine (SYNTHROID) 125 MCG tablet TAKE 1 TABLET BY MOUTH IN THE MORNING ON AN EMPTY STOMACH FOR THYROID   lisinopril-hydrochlorothiazide (ZESTORETIC) 20-12.5 MG tablet TAKE 1 TABLET  BY MOUTH EVERY DAY   metFORMIN (GLUCOPHAGE) 1000 MG tablet TAKE 1 TABLET (1,000 MG TOTAL) BY MOUTH 2 (TWO) TIMES DAILY. TAKE WITH FOOD   metoprolol tartrate (LOPRESSOR) 50 MG tablet Take 1 tablet (50 mg total) by mouth 2 (two) times daily.   nitroGLYCERIN (NITROSTAT) 0.4 MG SL tablet Place 1 tablet (0.4  mg total) under the tongue every 5 (five) minutes as needed for chest pain.   ONETOUCH ULTRA test strip USE TO CHECK BLOOD SUGAR DAILY. E 11.9 (TYPE 2 DIABETES MELLITIS)   OXYQUINOLONE SULFATE VAGINAL (TRIMO-SAN) 0.025 % GEL Place 1 Applicatorful vaginally every 30 (thirty) days.   pantoprazole (PROTONIX) 40 MG tablet Take by mouth.   simvastatin (ZOCOR) 40 MG tablet TAKE 1 TABLET BY MOUTH EVERY DAY FOR CHOLESTEROL   triamcinolone cream (KENALOG) 0.1 % Apply 1 application topically 2 (two) times daily.   vitamin B-12 (CYANOCOBALAMIN) 1000 MCG tablet Take 1,000 mcg by mouth daily.    No facility-administered medications prior to visit.    Review of Systems  Constitutional:  Negative for appetite change, chills, fatigue and fever.  Respiratory:  Negative for chest tightness and shortness of breath.   Cardiovascular:  Negative for chest pain and palpitations.  Gastrointestinal:  Negative for abdominal pain, nausea and vomiting.  Neurological:  Negative for dizziness and weakness.      Objective    BP 114/64   Pulse 76   Temp 97.6 F (36.4 C) (Temporal)   Resp 18   Ht '5\' 4"'$  (1.626 m)   Wt 135 lb (61.2 kg)   SpO2 97%   BMI 23.17 kg/m     Physical Exam   General: Appearance:    Well developed, well nourished female in no acute distress  Eyes:    PERRL, conjunctiva/corneas clear, EOM's intact       Lungs:     Clear to auscultation bilaterally, respirations unlabored  Heart:    Normal heart rate. Normal rhythm. No murmurs, rubs, or gallops.    MS:   All extremities are intact.    Neurologic:   Awake, alert, oriented x 3. No apparent focal neurological defect.        Results for orders placed or performed in visit on 09/19/20  POCT glycosylated hemoglobin (Hb A1C)  Result Value Ref Range   Hemoglobin A1C 7.6 (A) 4.0 - 5.6 %   Est. average glucose Bld gHb Est-mCnc 171     Assessment & Plan     1. Type 2 diabetes mellitus with hyperglycemia, without long-term current use of  insulin (HCC) Doing well current dose of metformin.   2. Vitamin D deficiency  - VITAMIN D 25 Hydroxy (Vit-D Deficiency, Fractures)  3. Hypothyroidism, unspecified type  - TSH  4. Primary hypertension Feels well on current medications. Continue current medications.   - Renal function panel     The entirety of the information documented in the History of Present Illness, Review of Systems and Physical Exam were personally obtained by me. Portions of this information were initially documented by the CMA and reviewed by me for thoroughness and accuracy.     [name], MD  [facility] [phone] (phone) [phone] (fax)  [place]

## 2020-09-20 LAB — RENAL FUNCTION PANEL
Albumin: 4.4 g/dL (ref 3.7–4.7)
BUN/Creatinine Ratio: 10 — ABNORMAL LOW (ref 12–28)
BUN: 8 mg/dL (ref 8–27)
CO2: 25 mmol/L (ref 20–29)
Calcium: 9.9 mg/dL (ref 8.7–10.3)
Chloride: 95 mmol/L — ABNORMAL LOW (ref 96–106)
Creatinine, Ser: 0.82 mg/dL (ref 0.57–1.00)
Glucose: 132 mg/dL — ABNORMAL HIGH (ref 65–99)
Phosphorus: 3.7 mg/dL (ref 3.0–4.3)
Potassium: 3.8 mmol/L (ref 3.5–5.2)
Sodium: 140 mmol/L (ref 134–144)
eGFR: 73 mL/min/{1.73_m2} (ref >59)

## 2020-09-20 LAB — VITAMIN D 25 HYDROXY (VIT D DEFICIENCY, FRACTURES): Vit D, 25-Hydroxy: 97.5 ng/mL (ref 30.0–100.0)

## 2020-09-20 LAB — TSH: TSH: 0.423 u[IU]/mL — ABNORMAL LOW (ref 0.450–4.500)

## 2020-10-02 ENCOUNTER — Other Ambulatory Visit: Payer: Self-pay | Admitting: Family Medicine

## 2020-10-02 NOTE — Telephone Encounter (Signed)
Requested medication (s) are due for refill today: expired medication  Requested medication (s) are on the active medication list: yes  Last refill:  08/03/19 #90 4 refills  Future visit scheduled: no last seen 1 week ago   Notes to clinic:  expired medication, do you want to renew Rx? Last seen 1 week ago      Requested Prescriptions  Pending Prescriptions Disp Refills   simvastatin (ZOCOR) 40 MG tablet [Pharmacy Med Name: SIMVASTATIN 40 MG TABLET] 90 tablet 4    Sig: TAKE 1 TABLET BY MOUTH EVERY DAY FOR CHOLESTEROL     Cardiovascular:  Antilipid - Statins Failed - 10/02/2020  3:09 PM      Failed - Triglycerides in normal range and within 360 days    Triglycerides  Date Value Ref Range Status  05/16/2020 244 (H) 0 - 149 mg/dL Final          Passed - Total Cholesterol in normal range and within 360 days    Cholesterol, Total  Date Value Ref Range Status  05/16/2020 158 100 - 199 mg/dL Final          Passed - LDL in normal range and within 360 days    LDL Chol Calc (NIH)  Date Value Ref Range Status  05/16/2020 69 0 - 99 mg/dL Final   LDL Direct  Date Value Ref Range Status  05/11/2017 73 0 - 99 mg/dL Final          Passed - HDL in normal range and within 360 days    HDL  Date Value Ref Range Status  05/16/2020 50 >39 mg/dL Final          Passed - Patient is not pregnant      Passed - Valid encounter within last 12 months    Recent Outpatient Visits           1 week ago Type 2 diabetes mellitus with hyperglycemia, without long-term current use of insulin ([place])   [facility] Birdie Sons, MD   4 months ago Type 2 diabetes mellitus with hyperglycemia, without long-term current use of insulin ([place])   [facility] Birdie Sons, MD   8 months ago Type 2 diabetes mellitus with hyperglycemia, without long-term current use of insulin [facility])   [facility] Birdie Sons, MD   1 year ago Essential hypertension    [facility] Birdie Sons, MD   1 year ago Type 2 diabetes mellitus with hyperglycemia, without long-term current use of insulin [facility])   [facility] Caryn Section, Kirstie Peri, MD

## 2020-10-09 ENCOUNTER — Other Ambulatory Visit: Payer: Self-pay | Admitting: Family Medicine

## 2020-10-09 NOTE — Telephone Encounter (Signed)
Valid encounter within 6 months. No future visit at this time

## 2020-11-03 DIAGNOSIS — H353221 Exudative age-related macular degeneration, left eye, with active choroidal neovascularization: Secondary | ICD-10-CM | POA: Diagnosis not present

## 2020-11-03 DIAGNOSIS — H353211 Exudative age-related macular degeneration, right eye, with active choroidal neovascularization: Secondary | ICD-10-CM | POA: Diagnosis not present

## 2020-12-09 ENCOUNTER — Telehealth: Payer: Self-pay

## 2020-12-09 NOTE — Telephone Encounter (Signed)
Copied from [place] [id number]. Topic: General - Other >> Dec 08, 2020  4:08 PM Celene Kras wrote: Reason for CRM: Pt called and is requesting to know if the appt scheduled for 02/10/21. She states that she is not sure if she will be having blood work and if she needs to fast until 3:20 that day. Please advise.

## 2020-12-09 NOTE — Telephone Encounter (Signed)
Will be doing some blood work, but she does not need to be fasting.

## 2020-12-15 ENCOUNTER — Other Ambulatory Visit: Payer: Self-pay

## 2020-12-15 ENCOUNTER — Ambulatory Visit: Payer: PPO | Admitting: Obstetrics & Gynecology

## 2020-12-15 ENCOUNTER — Encounter: Payer: Self-pay | Admitting: Obstetrics & Gynecology

## 2020-12-15 VITALS — BP 120/80 | Ht 62.0 in | Wt 138.0 lb

## 2020-12-15 DIAGNOSIS — N814 Uterovaginal prolapse, unspecified: Secondary | ICD-10-CM

## 2020-12-15 DIAGNOSIS — N813 Complete uterovaginal prolapse: Secondary | ICD-10-CM

## 2020-12-15 NOTE — Telephone Encounter (Signed)
Patient advised.

## 2020-12-15 NOTE — Progress Notes (Signed)
HPI:      [name] is a 80 y.o. [phone] who presents today for her pessary follow up and examination related to her pelvic floor weakening.  Pt reports tolerating the pessary well with no vaginal bleeding and no vaginal discharge.  Symptoms of pelvic floor weakening have greatly improved. She is voiding and defecating without difficulty. She currently has a Gellhorn #3 pessary.  PMHx: She  has a past medical history of Alopecia, Anginal pain ([place]), Benign neoplasm of large bowel, CAD (coronary artery disease), Carpal tunnel syndrome, Complication of anesthesia, DM (diabetes mellitus) ([place]), Erosion of cervix, GERD (gastroesophageal reflux disease), H/O adenomatous polyp of colon, History of chicken pox, Hypercholesteremia, Hypertension, Hypothyroid, Irritable bowel, Malignant melanoma of foot ([place]), Osteoporosis, PONV (postoperative nausea and vomiting), Post-menopausal bleeding, Procidentia of uterus, Rectal bleeding, Sepsis ([place]) (02/18/2018), and TIA (transient ischemic attack). Also,  has a past surgical history that includes Tonsillectomy; Knee surgery; Cardiac catheterization (10/2010); Colonoscopy (03/2008); Breast excisional biopsy (Right); Cataract extraction w/PHACO (Right, 07/28/2016); Cataract extraction w/PHACO (Left, 10/27/2016); Esophagogastroduodenoscopy (egd) with propofol (N/A, 05/12/2018); and Colonoscopy with propofol (N/A, 05/12/2018)., family history includes Diabetes in her father; Heart disease in her paternal grandmother and sister; Lung cancer in her brother; Rheumatic fever in her sister.,  reports that she quit smoking about 32 years ago. Her smoking use included cigarettes. She has never used smokeless tobacco. She reports that she does not drink alcohol and does not use drugs.  She has a current medication list which includes the following prescription(s): aspirin ec, calcium carbonate, cholecalciferol, clopidogrel, estradiol, levothyroxine, lisinopril-hydrochlorothiazide,  metformin, metoprolol tartrate, nitroglycerin, onetouch ultra, trimo-san, pantoprazole, simvastatin, triamcinolone cream, and vitamin b-12. Also, has No Known Allergies.  Review of Systems  All other systems reviewed and are negative.  Objective: BP 120/80   Ht 5\' 2"  (1.575 m)   Wt 138 lb (62.6 kg)   BMI 25.24 kg/m  Physical Exam Constitutional:      General: She is not in acute distress.    Appearance: She is well-developed.  Genitourinary:     Right Labia: No rash or tenderness.    Left Labia: No tenderness or rash.    No vaginal erythema or bleeding.     Mild vaginal atrophy present.     Right Adnexa: not tender and no mass present.    Left Adnexa: not tender and no mass present.    No cervical motion tenderness, discharge, polyp or nabothian cyst.     Uterus is prolapsed.     Uterus is not enlarged.     No uterine mass detected.    Uterus is midaxial.     Pelvic exam was performed with patient in the lithotomy position.  HENT:     Head: Normocephalic and atraumatic.     Nose: Nose normal.  Abdominal:     General: There is no distension.     Palpations: Abdomen is soft.     Tenderness: There is no abdominal tenderness.  Musculoskeletal:        General: Normal range of motion.  Neurological:     Mental Status: She is alert and oriented to person, place, and time.     Cranial Nerves: No cranial nerve deficit.  Skin:    General: Skin is warm and dry.  Psychiatric:        Attention and Perception: Attention normal.        Mood and Affect: Mood and affect normal.        Speech:  Speech normal.        Behavior: Behavior normal.        Thought Content: Thought content normal.        Judgment: Judgment normal.    Pessary Care Pessary removed and cleaned.  Vagina checked - without erosions - pessary replaced.  A/P:   ICD-10-CM   1. Procidentia of uterus  N81.3     2. Cystocele with prolapse  N81.4      Pessary was cleaned and replaced today. Instructions given  for care. Concerning symptoms to observe for are counseled to patient. Follow up scheduled for 3 months.  A total of 21 minutes were spent face-to-face with the patient as well as preparation, review, communication, and documentation during this encounter.   Barnett Applebaum, MD, [facility], [facility] 12/15/2020  11:16 AM

## 2020-12-18 ENCOUNTER — Ambulatory Visit: Payer: PPO | Admitting: Obstetrics & Gynecology

## 2020-12-24 ENCOUNTER — Ambulatory Visit: Payer: PPO

## 2021-01-09 DIAGNOSIS — H353211 Exudative age-related macular degeneration, right eye, with active choroidal neovascularization: Secondary | ICD-10-CM | POA: Diagnosis not present

## 2021-01-09 DIAGNOSIS — H02831 Dermatochalasis of right upper eyelid: Secondary | ICD-10-CM | POA: Diagnosis not present

## 2021-01-09 DIAGNOSIS — H353221 Exudative age-related macular degeneration, left eye, with active choroidal neovascularization: Secondary | ICD-10-CM | POA: Diagnosis not present

## 2021-01-09 LAB — HM DIABETES EYE EXAM

## 2021-01-26 DIAGNOSIS — I2510 Atherosclerotic heart disease of native coronary artery without angina pectoris: Secondary | ICD-10-CM | POA: Diagnosis not present

## 2021-01-26 DIAGNOSIS — E7800 Pure hypercholesterolemia, unspecified: Secondary | ICD-10-CM | POA: Diagnosis not present

## 2021-01-26 DIAGNOSIS — I491 Atrial premature depolarization: Secondary | ICD-10-CM | POA: Diagnosis not present

## 2021-01-26 DIAGNOSIS — I4510 Unspecified right bundle-branch block: Secondary | ICD-10-CM | POA: Diagnosis not present

## 2021-01-26 DIAGNOSIS — I700 Atherosclerosis of aorta: Secondary | ICD-10-CM | POA: Diagnosis not present

## 2021-01-26 DIAGNOSIS — I10 Essential (primary) hypertension: Secondary | ICD-10-CM | POA: Diagnosis not present

## 2021-02-10 ENCOUNTER — Telehealth: Payer: Self-pay | Admitting: Family Medicine

## 2021-02-10 ENCOUNTER — Ambulatory Visit (INDEPENDENT_AMBULATORY_CARE_PROVIDER_SITE_OTHER): Payer: PPO | Admitting: Family Medicine

## 2021-02-10 ENCOUNTER — Encounter: Payer: Self-pay | Admitting: Family Medicine

## 2021-02-10 ENCOUNTER — Other Ambulatory Visit: Payer: Self-pay

## 2021-02-10 VITALS — BP 122/73 | HR 61 | Temp 98.0°F | Resp 16 | Wt 143.3 lb

## 2021-02-10 DIAGNOSIS — I2510 Atherosclerotic heart disease of native coronary artery without angina pectoris: Secondary | ICD-10-CM

## 2021-02-10 DIAGNOSIS — I10 Essential (primary) hypertension: Secondary | ICD-10-CM

## 2021-02-10 DIAGNOSIS — E119 Type 2 diabetes mellitus without complications: Secondary | ICD-10-CM

## 2021-02-10 DIAGNOSIS — E039 Hypothyroidism, unspecified: Secondary | ICD-10-CM

## 2021-02-10 DIAGNOSIS — Z23 Encounter for immunization: Secondary | ICD-10-CM

## 2021-02-10 DIAGNOSIS — E1165 Type 2 diabetes mellitus with hyperglycemia: Secondary | ICD-10-CM

## 2021-02-10 LAB — POCT GLYCOSYLATED HEMOGLOBIN (HGB A1C)
Est. average glucose Bld gHb Est-mCnc: 166
Hemoglobin A1C: 7.4 % — AB (ref 4.0–5.6)

## 2021-02-10 MED ORDER — METFORMIN HCL 1000 MG PO TABS: 1000.0000 mg | ORAL_TABLET | Freq: Two times a day (BID) | ORAL | 4 refills | Status: DC

## 2021-02-10 MED ORDER — LEVOTHYROXINE SODIUM 125 MCG PO TABS: 125.0000 ug | ORAL_TABLET | Freq: Every day | ORAL | 4 refills | Status: DC

## 2021-02-10 MED ORDER — METOPROLOL TARTRATE 50 MG PO TABS: 50.0000 mg | ORAL_TABLET | Freq: Two times a day (BID) | ORAL | 12 refills | Status: AC

## 2021-02-10 MED ORDER — CLOPIDOGREL BISULFATE 75 MG PO TABS: 75.0000 mg | ORAL_TABLET | Freq: Every day | ORAL | 1 refills | Status: DC

## 2021-02-10 NOTE — Telephone Encounter (Signed)
This patient is scheduled for a physical with me at 1100am on 4-25, but she needs fasting labs done at this visit and we will not be able to get her to lab before it closes for lunch on that date. Can she be moved to an earlier time please.

## 2021-02-10 NOTE — Telephone Encounter (Signed)
Tried calling patient. Left message to call back. OK for [facility] to advise patient and reschedule appointment. Please offer her the 9am appointment on that same day 06/16/2021.

## 2021-02-10 NOTE — Telephone Encounter (Signed)
Does she want to go to the lab before her appointment? If so then she should be here about 10:30. The lab will be closed by the time we finish her 11am appointment.  If so then order a CBC, met C, lipids, vitamin D and hgba1c.

## 2021-02-10 NOTE — Progress Notes (Signed)
Established patient visit   Patient: April Pittman   DOB: [birth year]   80 y.o. Female  MRN: [id number] Visit Date: 02/10/2021  Today's healthcare provider: [name], MD   Chief Complaint  Patient presents with   Diabetes   Hypertension   Hypothyroidism   I,Sulibeya S Dimas,acting as a scribe for [name], MD.,have documented all relevant documentation on the behalf of [name], MD,as directed by  [name], MD while in the presence of [name], MD.  Subjective    HPI  Diabetes Mellitus Type II, follow-up  Lab Results  Component Value Date   HGBA1C 7.6 (A) 09/19/2020   HGBA1C 7.8 (A) 05/16/2020   HGBA1C 8.3 (A) 01/15/2020   Last seen for diabetes 4 months ago.  Management since then includes continuing the same treatment. She reports excellent compliance with treatment. She is not having side effects.   Home blood sugar records: fasting range: 140s  Episodes of hypoglycemia? No    Current insulin regiment: none Most Recent Eye Exam: within the last year  --------------------------------------------------------------------------------------------------- Hypertension, follow-up  BP Readings from Last 3 Encounters:  02/10/21 122/73  12/15/20 120/80  09/19/20 114/64   Wt Readings from Last 3 Encounters:  02/10/21 143 lb 4.8 oz (65 kg)  12/15/20 138 lb (62.6 kg)  09/19/20 135 lb (61.2 kg)     She was last seen for hypertension 4 months ago.  BP at that visit was 114/64. Management since that visit includes . She reports excellent compliance with treatment. She is not having side effects.  She is not exercising. She is not adherent to low salt diet.   Outside blood pressures are stable.  She does not smoke.  Use of agents associated with hypertension: none.   --------------------------------------------------------------------------------------------------- Hypothyroid, follow-up  Lab Results  Component Value Date   TSH  0.423 (L) 09/19/2020   TSH 1.540 05/16/2020   TSH 0.536 05/11/2019   FREET4 1.36 05/16/2020   T4TOTAL 9.8 04/28/2016   T4TOTAL 8.8 09/29/2015    Wt Readings from Last 3 Encounters:  02/10/21 143 lb 4.8 oz (65 kg)  12/15/20 138 lb (62.6 kg)  09/19/20 135 lb (61.2 kg)    She was last seen for hypothyroid 4 months ago.  Management since that visit includes no changes. She reports excellent compliance with treatment. She is not having side effects.   Symptoms: No change in energy level No constipation  Yes diarrhea No heat / cold intolerance  No nervousness No palpitations  No weight changes    -----------------------------------------------------------------------------------------    Medications: Outpatient Medications Prior to Visit  Medication Sig   aspirin EC 81 MG tablet Take 81 mg by mouth daily.   calcium carbonate (OS-CAL) 1250 (500 Ca) MG chewable tablet Chew 1 tablet by mouth daily.    cholecalciferol (VITAMIN D) 25 MCG (1000 UNIT) tablet Take 5,000 Units by mouth daily.   clopidogrel (PLAVIX) 75 MG tablet Take 1 tablet (75 mg total) by mouth daily.   estradiol (ESTRACE) 0.1 MG/GM vaginal cream Place 1 Applicatorful vaginally once a week.   levothyroxine (SYNTHROID) 125 MCG tablet TAKE 1 TABLET BY MOUTH IN THE MORNING ON AN EMPTY STOMACH FOR THYROID   lisinopril-hydrochlorothiazide (ZESTORETIC) 20-12.5 MG tablet TAKE 1 TABLET BY MOUTH EVERY DAY   metFORMIN (GLUCOPHAGE) 1000 MG tablet TAKE 1 TABLET (1,000 MG TOTAL) BY MOUTH 2 (TWO) TIMES DAILY. TAKE WITH FOOD   metoprolol tartrate (LOPRESSOR) 50 MG tablet Take 1 tablet (50 mg  total) by mouth 2 (two) times daily.   nitroGLYCERIN (NITROSTAT) 0.4 MG SL tablet Place 1 tablet (0.4 mg total) under the tongue every 5 (five) minutes as needed for chest pain.   ONETOUCH ULTRA test strip USE TO CHECK BLOOD SUGAR DAILY. E 11.9 (TYPE 2 DIABETES MELLITIS)   OXYQUINOLONE SULFATE VAGINAL (TRIMO-SAN) 0.025 % GEL Place 1 Applicatorful  vaginally every 30 (thirty) days.   pantoprazole (PROTONIX) 40 MG tablet Take by mouth.   simvastatin (ZOCOR) 40 MG tablet TAKE 1 TABLET BY MOUTH EVERY DAY FOR CHOLESTEROL   triamcinolone cream (KENALOG) 0.1 % Apply 1 application topically 2 (two) times daily.   vitamin B-12 (CYANOCOBALAMIN) 1000 MCG tablet Take 1,000 mcg by mouth daily.    No facility-administered medications prior to visit.    Review of Systems  Constitutional:  Negative for appetite change and fatigue.  Respiratory:  Negative for chest tightness and shortness of breath.   Cardiovascular:  Negative for chest pain, palpitations and leg swelling.       Objective    BP 122/73 (BP Location: Right Arm, Patient Position: Sitting, Cuff Size: Large)    Pulse 61    Temp 98 F (36.7 C) (Oral)    Resp 16    Wt 143 lb 4.8 oz (65 kg)    SpO2 99%    BMI 26.21 kg/m  BP Readings from Last 3 Encounters:  02/10/21 122/73  12/15/20 120/80  09/19/20 114/64     Physical Exam   General: Appearance:     Well developed, well nourished female in no acute distress  Eyes:    PERRL, conjunctiva/corneas clear, EOM's intact       Lungs:     Clear to auscultation bilaterally, respirations unlabored  Heart:    Normal heart rate. Normal rhythm. No murmurs, rubs, or gallops.    MS:   All extremities are intact.    Neurologic:   Awake, alert, oriented x 3. No apparent focal neurological defect.        Results for orders placed or performed in visit on 02/10/21  POCT glycosylated hemoglobin (Hb A1C)  Result Value Ref Range   Hemoglobin A1C 7.4 (A) 4.0 - 5.6 %   Est. average glucose Bld gHb Est-mCnc 166      Assessment & Plan     1. Type 2 diabetes mellitus with hyperglycemia, without long-term current use of insulin (HCC) Well controlled.  Continue current medications.    2. Hypothyroidism, unspecified type Clinically euthyroid.  - levothyroxine (SYNTHROID) 125 MCG tablet; Take 1 tablet (125 mcg total) by mouth daily.  Dispense: 90  tablet; Refill: 4  3. Primary hypertension Well controlled. Continue current medications.     - metoprolol tartrate (LOPRESSOR) 50 MG tablet; Take 1 tablet (50 mg total) by mouth 2 (two) times daily.  Dispense: 60 tablet; Refill: 12  4. Type 2 diabetes mellitus without complication, without long-term current use of insulin (HCC) refill- metFORMIN (GLUCOPHAGE) 1000 MG tablet; Take 1 tablet (1,000 mg total) by mouth 2 (two) times daily. Take with food  Dispense: 180 tablet; Refill: 4  5. Need for influenza vaccination  - Flu Vaccine QUAD High Dose IM (Fluad)  6. Coronary artery disease involving native coronary artery of native heart without angina pectoris Asymptomatic. Compliant with medication.  Continue aggressive risk factor modification.   - clopidogrel (PLAVIX) 75 MG tablet; Take 1 tablet (75 mg total) by mouth daily.  Dispense: 90 tablet; Refill: 1  Future Appointments  Date Time Provider [place]  04/29/2021  2:20 PM [facility] HEALTH ADVISOR [facility]  06/16/2021 11:00 AM Lealand Elting, Kirstie Peri, MD [facility]      [name], MD  [facility] [phone] (phone) [phone] (fax)  [place]

## 2021-02-10 NOTE — Telephone Encounter (Signed)
Patient has been rescheduled by [facility] agent

## 2021-02-10 NOTE — Telephone Encounter (Signed)
Patient returned call and states she explained why appointment needed to be scheduled for 11am. Patients she rides the bus and her spouse has nursing care that arrives at [place] and unable to do [place] would like to speak with nurse directly.

## 2021-02-17 ENCOUNTER — Encounter: Payer: Self-pay | Admitting: Family Medicine

## 2021-02-20 DIAGNOSIS — M76892 Other specified enthesopathies of left lower limb, excluding foot: Secondary | ICD-10-CM | POA: Diagnosis not present

## 2021-03-06 ENCOUNTER — Other Ambulatory Visit: Payer: Self-pay | Admitting: Family Medicine

## 2021-03-10 DIAGNOSIS — H353221 Exudative age-related macular degeneration, left eye, with active choroidal neovascularization: Secondary | ICD-10-CM | POA: Diagnosis not present

## 2021-03-10 DIAGNOSIS — H353211 Exudative age-related macular degeneration, right eye, with active choroidal neovascularization: Secondary | ICD-10-CM | POA: Diagnosis not present

## 2021-03-14 ENCOUNTER — Other Ambulatory Visit: Payer: Self-pay | Admitting: Family Medicine

## 2021-03-14 NOTE — Telephone Encounter (Signed)
Requested Prescriptions  Pending Prescriptions Disp Refills   lisinopril-hydrochlorothiazide (ZESTORETIC) 20-12.5 MG tablet [Pharmacy Med Name: LISINOPRIL-HCTZ 20-12.5 MG TAB] 90 tablet 0    Sig: TAKE 1 TABLET BY MOUTH EVERY DAY     Cardiovascular:  ACEI + Diuretic Combos Passed - 03/14/2021  3:07 PM      Passed - Na in normal range and within 180 days    Sodium  Date Value Ref Range Status  09/19/2020 140 134 - 144 mmol/L Final         Passed - K in normal range and within 180 days    Potassium  Date Value Ref Range Status  09/19/2020 3.8 3.5 - 5.2 mmol/L Final         Passed - Cr in normal range and within 180 days    Creatinine, Ser  Date Value Ref Range Status  09/19/2020 0.82 0.57 - 1.00 mg/dL Final   Creatinine, POC  Date Value Ref Range Status  10/05/2016 n/a mg/dL Final         Passed - Ca in normal range and within 180 days    Calcium  Date Value Ref Range Status  09/19/2020 9.9 8.7 - 10.3 mg/dL Final         Passed - Patient is not pregnant      Passed - Last BP in normal range    BP Readings from Last 1 Encounters:  02/10/21 122/73         Passed - Valid encounter within last 6 months    Recent Outpatient Visits          1 month ago Type 2 diabetes mellitus with hyperglycemia, without long-term current use of insulin ([place])   [facility] Birdie Sons, MD   5 months ago Type 2 diabetes mellitus with hyperglycemia, without long-term current use of insulin ([place])   [facility] Birdie Sons, MD   10 months ago Type 2 diabetes mellitus with hyperglycemia, without long-term current use of insulin [facility])   [facility] Birdie Sons, MD   1 year ago Type 2 diabetes mellitus with hyperglycemia, without long-term current use of insulin [facility])   [facility] Birdie Sons, MD   1 year ago Essential hypertension   [place], Kirstie Peri, MD      Future Appointments             In 3 months Fisher, Kirstie Peri, MD [facility] [place]

## 2021-04-02 DIAGNOSIS — Z951 Presence of aortocoronary bypass graft: Secondary | ICD-10-CM | POA: Diagnosis not present

## 2021-04-02 DIAGNOSIS — I10 Essential (primary) hypertension: Secondary | ICD-10-CM | POA: Diagnosis not present

## 2021-04-02 DIAGNOSIS — E1151 Type 2 diabetes mellitus with diabetic peripheral angiopathy without gangrene: Secondary | ICD-10-CM | POA: Diagnosis not present

## 2021-04-02 DIAGNOSIS — I25118 Atherosclerotic heart disease of native coronary artery with other forms of angina pectoris: Secondary | ICD-10-CM | POA: Diagnosis not present

## 2021-04-02 DIAGNOSIS — Z7982 Long term (current) use of aspirin: Secondary | ICD-10-CM | POA: Diagnosis not present

## 2021-04-02 DIAGNOSIS — Z7902 Long term (current) use of antithrombotics/antiplatelets: Secondary | ICD-10-CM | POA: Diagnosis not present

## 2021-04-02 DIAGNOSIS — Z7984 Long term (current) use of oral hypoglycemic drugs: Secondary | ICD-10-CM | POA: Diagnosis not present

## 2021-04-02 DIAGNOSIS — H353290 Exudative age-related macular degeneration, unspecified eye, stage unspecified: Secondary | ICD-10-CM | POA: Diagnosis not present

## 2021-04-29 ENCOUNTER — Ambulatory Visit (INDEPENDENT_AMBULATORY_CARE_PROVIDER_SITE_OTHER): Payer: PPO

## 2021-04-29 DIAGNOSIS — Z0000 Encounter for general adult medical examination without abnormal findings: Secondary | ICD-10-CM

## 2021-04-29 NOTE — Progress Notes (Signed)
Virtual Visit via Telephone Note  I connected with  April Pittman on 04/29/21 at  2:20 PM EST by telephone and verified that I am speaking with the correct person using two identifiers.  Location: Patient: home Provider: [facility] Persons participating in the virtual visit: [place]   I discussed the limitations, risks, security and privacy concerns of performing an evaluation and management service by telephone and the availability of in person appointments. The patient expressed understanding and agreed to proceed.  Interactive audio and video telecommunications were attempted between this nurse and patient, however failed, due to patient having technical difficulties OR patient did not have access to video capability.  We continued and completed visit with audio only.  Some vital signs may be absent or patient reported.   Dionisio David, LPN  Subjective:   April Pittman is a 81 y.o. female who presents for Medicare Annual (Subsequent) preventive examination.  Review of Systems           Objective:    There were no vitals filed for this visit. There is no height or weight on file to calculate BMI.  Advanced Directives 06/07/2020 04/23/2020 06/08/2018 04/14/2018 03/03/2018 02/18/2018 11/18/2017  Does Patient Have a Medical Advance Directive? No No Yes No No No;Yes No  Type of Advance Directive - - - - - Living will -  Does patient want to make changes to medical advance directive? - - - - No - Patient declined Yes (Inpatient - patient defers changing a medical advance directive at this time) -  Would patient like information on creating a medical advance directive? - No - Patient declined - - - No - Patient declined No - Patient declined    Current Medications (verified) Outpatient Encounter Medications as of 04/29/2021  Medication Sig   aspirin EC 81 MG tablet Take 81 mg by mouth daily.   calcium carbonate (OS-CAL) 1250 (500 Ca) MG chewable tablet Chew 1 tablet  by mouth daily.    cholecalciferol (VITAMIN D) 25 MCG (1000 UNIT) tablet Take 5,000 Units by mouth daily.   clopidogrel (PLAVIX) 75 MG tablet Take 1 tablet (75 mg total) by mouth daily.   estradiol (ESTRACE) 0.1 MG/GM vaginal cream Place 1 Applicatorful vaginally once a week.   levothyroxine (SYNTHROID) 125 MCG tablet Take 1 tablet (125 mcg total) by mouth daily.   lisinopril-hydrochlorothiazide (ZESTORETIC) 20-12.5 MG tablet TAKE 1 TABLET BY MOUTH EVERY DAY   metFORMIN (GLUCOPHAGE) 1000 MG tablet Take 1 tablet (1,000 mg total) by mouth 2 (two) times daily. Take with food   metoprolol tartrate (LOPRESSOR) 50 MG tablet Take 1 tablet (50 mg total) by mouth 2 (two) times daily.   nitroGLYCERIN (NITROSTAT) 0.4 MG SL tablet Place 1 tablet (0.4 mg total) under the tongue every 5 (five) minutes as needed for chest pain.   ONETOUCH ULTRA test strip USE TO CHECK BLOOD SUGAR DAILY. E 11.9 (TYPE 2 DIABETES MELLITIS)   simvastatin (ZOCOR) 40 MG tablet TAKE 1 TABLET BY MOUTH EVERY DAY FOR CHOLESTEROL   vitamin B-12 (CYANOCOBALAMIN) 1000 MCG tablet Take 1,000 mcg by mouth daily.    No facility-administered encounter medications on file as of 04/29/2021.    Allergies (verified) Patient has no known allergies.   History: Past Medical History:  Diagnosis Date   Alopecia    Anginal pain ([place])    Benign neoplasm of large bowel    CAD (coronary artery disease)    Carpal tunnel syndrome    Complication  of anesthesia    vomits with any pain medicine   DM (diabetes mellitus) ([place])    Erosion of cervix    GERD (gastroesophageal reflux disease)    H/O adenomatous polyp of colon    with sever dysplasia, removed 2000 by colonoscopy   History of chicken pox    Hypercholesteremia    Hypertension    Hypothyroid    Irritable bowel    Malignant melanoma of foot ([place])    Osteoporosis    PONV (postoperative nausea and vomiting)    Post-menopausal bleeding    Procidentia of uterus    with traction cystocele,  managed with gellhorn pessary   Rectal bleeding    Sepsis ([place]) 02/18/2018   TIA (transient ischemic attack)    Past Surgical History:  Procedure Laterality Date   BREAST EXCISIONAL BIOPSY Right    Benign   CARDIAC CATHETERIZATION  10/2010   +stenting to LAD and RCA; symptoms: chest pain, elevated Troponin, +AMI. [place] W/PHACO Right 07/28/2016   Procedure: CATARACT EXTRACTION PHACO AND INTRAOCULAR LENS PLACEMENT ([place])  Right Diabetic;  Surgeon: Leandrew Koyanagi, MD;  Location: [facility];  Service: Ophthalmology;  Laterality: Right;  Diabetic leave patient at 9 arrival   CATARACT EXTRACTION W/PHACO Left 10/27/2016   Procedure: CATARACT EXTRACTION PHACO AND INTRAOCULAR LENS PLACEMENT ([place]) LEFT DIABETIC;  Surgeon: Leandrew Koyanagi, MD;  Location: [facility];  Service: Ophthalmology;  Laterality: Left;   COLONOSCOPY  03/2008   2 polyps, internal and external hemorrhoids, +anal growth. Dr. Vira Agar   COLONOSCOPY WITH PROPOFOL N/A 05/12/2018   Procedure: COLONOSCOPY WITH PROPOFOL;  Surgeon: Manya Silvas, MD;  [facility] ENDOSCOPY;  Service: Endoscopy;  Laterality: N/A;   ESOPHAGOGASTRODUODENOSCOPY (EGD) WITH PROPOFOL N/A 05/12/2018   Procedure: ESOPHAGOGASTRODUODENOSCOPY (EGD) WITH PROPOFOL;  Surgeon: Manya Silvas, MD;  Location: [facility] ENDOSCOPY;  Service: Endoscopy;  Laterality: N/A;   KNEE SURGERY     TONSILLECTOMY     as a child   Family History  Problem Relation Age of Onset   Lung cancer Brother    Diabetes Father    Heart disease Sister        Valvular rheumatic heart disease   Rheumatic fever Sister    Heart disease Paternal Grandmother    Breast cancer Neg Hx    Ovarian cancer Neg Hx    Colon cancer Neg Hx    Social History   Socioeconomic History   Marital status: Married    Spouse name: Not on file   Number of children: 3   Years of education: Not on file   Highest education level: GED or equivalent   Occupational History   Occupation: Retired    Comment: former Engineer, manufacturing systems  Tobacco Use   Smoking status: Former    Types: Cigarettes    Quit date: 03/03/1988    Years since quitting: 33.1   Smokeless tobacco: Never  Vaping Use   Vaping Use: Never used  Substance and Sexual Activity   Alcohol use: No    Alcohol/week: 0.0 standard drinks   Drug use: No   Sexual activity: Not Currently  Other Topics Concern   Not on file  Social History Narrative   Not on file   Social Determinants of Health   Financial Resource Strain: Not on file  Food Insecurity: Not on file  Transportation Needs: Not on file  Physical Activity: Not on file  Stress: Not on file  Social Connections: Not on file  Tobacco Counseling Counseling given: Not Answered   Clinical Intake:  Pre-visit preparation completed: Yes  Pain : No/denies pain     Nutritional Risks: None Diabetes: Yes CBG done?: No Did pt. bring in CBG monitor from home?: No  How often do you need to have someone help you when you read instructions, pamphlets, or other written materials from your doctor or pharmacy?: 1 - Never  Diabetic?yes Nutrition Risk Assessment:  Has the patient had any N/V/D within the last 2 months?  No  Does the patient have any non-healing wounds?  No  Has the patient had any unintentional weight loss or weight gain?  No   Diabetes:  Is the patient diabetic?  Yes  If diabetic, was a CBG obtained today?  No  Did the patient bring in their glucometer from home?  No  How often do you monitor your CBG's? never.   Financial Strains and Diabetes Management:  Are you having any financial strains with the device, your supplies or your medication? No .  Does the patient want to be seen by Chronic Care Management for management of their diabetes?  No  Would the patient like to be referred to a Nutritionist or for Diabetic Management?  No   Diabetic Exams:  Diabetic Eye Exam: Completed 01/09/21.  Pt has been advised about the importance in completing this exam.   Diabetic Foot Exam: Completed 05/11/17. Pt has been advised about the importance in completing this exam.   Interpreter Needed?: No  Information entered by :: Kirke Shaggy, LPN   Activities of Daily Living No flowsheet data found.  Patient Care Team: Birdie Sons, MD as PCP - General (Family Medicine) Ubaldo Glassing Javier Docker, MD as Consulting Physician (Cardiology) Gae Dry, MD as Referring Physician (Obstetrics and Gynecology) Isaias Sakai, MD as Referring Physician (Ophthalmology) Leandrew Koyanagi, MD as Referring Physician (Ophthalmology)  Indicate any recent Medical Services you may have received from other than [facility] providers in the past year (date may be approximate).     Assessment:   This is a routine wellness examination for [facility].  Hearing/Vision screen No results found.  Dietary issues and exercise activities discussed:     Goals Addressed   None    Depression Screen PHQ 2/9 Scores 04/23/2020 01/15/2020 11/23/2018 11/18/2017 11/16/2016 12/31/2015 11/11/2014  PHQ - 2 Score 1 0 0 2 2 0 0  PHQ- 9 Score - - - 8 10 - -    Fall Risk Fall Risk  04/23/2020 09/18/2019 11/23/2018 11/18/2017 01/11/2017  Falls in the past year? 0 1 1 No No  Comment - Emmi Telephone Survey: data to providers prior to load - - -  Number falls in past yr: 0 1 1 - -  Comment - Emmi Telephone Survey Actual Response = 4 - - -  Injury with Fall? 0 1 1 - -  Comment - - - - -  Risk for fall due to : - - Impaired mobility;Impaired vision - -  Follow up - - Falls prevention discussed - -    FALL RISK PREVENTION PERTAINING TO THE HOME:  Any stairs in or around the home? Yes  If so, are there any without handrails? No  Home free of loose throw rugs in walkways, pet beds, electrical cords, etc? Yes  Adequate lighting in your home to reduce risk of falls? Yes   ASSISTIVE DEVICES UTILIZED TO PREVENT FALLS:  Life alert? No  Use  of a cane, walker or w/c? No /  walker when shopping Grab bars in the bathroom? No  Shower chair or bench in shower? No  Elevated toilet seat or a handicapped toilet? No    Cognitive Function:    6CIT Screen 11/18/2017  What Year? 0 points  What month? 0 points  What time? 0 points  Count back from 20 0 points  Months in reverse 2 points  Repeat phrase 0 points  Total Score 2    Immunizations Immunization History  Administered Date(s) Administered   Fluad Quad(high Dose 65+) 11/27/2018, 01/15/2020, 02/10/2021   Influenza, High Dose Seasonal PF 01/29/2015, 12/31/2015, 01/11/2017, 12/15/2017   Moderna Sars-Covid-2 Vaccination 02/10/2020   PFIZER(Purple Top)SARS-COV-2 Vaccination 03/08/2019, 03/29/2019   Pfizer Covid-19 Vaccine Bivalent Booster 53yr & up 03/06/2021   Pneumococcal Conjugate-13 11/07/2013   Pneumococcal Polysaccharide-23 12/07/2010   Zoster, Live 09/15/2015    TDAP status: Due, Education has been provided regarding the importance of this vaccine. Advised may receive this vaccine at local pharmacy or Health Dept. Aware to provide a copy of the vaccination record if obtained from local pharmacy or Health Dept. Verbalized acceptance and understanding.  Flu Vaccine status: Up to date  Pneumococcal vaccine status: Up to date  Covid-19 vaccine status: Completed vaccines  Qualifies for Shingles Vaccine? Yes   Zostavax completed Yes   Shingrix Completed?: No.    Education has been provided regarding the importance of this vaccine. Patient has been advised to call insurance company to determine out of pocket expense if they have not yet received this vaccine. Advised may also receive vaccine at local pharmacy or Health Dept. Verbalized acceptance and understanding.  Screening Tests Health Maintenance  Topic Date Due   TETANUS/TDAP  Never done   Zoster Vaccines- Shingrix (1 of 2) Never done   FOOT EXAM  05/12/2018   DEXA SCAN  12/13/2020   HEMOGLOBIN A1C  08/11/2021    OPHTHALMOLOGY EXAM  01/09/2022   COLONOSCOPY (Pts 45-461yrInsurance coverage will need to be confirmed)  05/12/2023   Pneumonia Vaccine 6564Years old  Completed   INFLUENZA VACCINE  Completed   COVID-19 Vaccine  Completed   HPV VACCINES  Aged Out    Health Maintenance  Health Maintenance Due  Topic Date Due   TETANUS/TDAP  Never done   Zoster Vaccines- Shingrix (1 of 2) Never done   FOOT EXAM  05/12/2018   DEXA SCAN  12/13/2020    Colorectal cancer screening: No longer required.   Mammogram status: No longer required due to age.  Bone Density status: Completed 12/14/18. Results reflect: Bone density results: NORMAL. Repeat every 5 years.  Lung Cancer Screening: (Low Dose CT Chest recommended if Age 81-80ears, 30 pack-year currently smoking OR have quit w/in 15years.) does not qualify.    Additional Screening:  Hepatitis C Screening: does not qualify; Completed no  Vision Screening: Recommended annual ophthalmology exams for early detection of glaucoma and other disorders of the eye. Is the patient up to date with their annual eye exam?  Yes  Who is the provider or what is the name of the office in which the patient attends annual eye exams? [facility], would they like to be referred to a provider to establish care? No .   Dental Screening: Recommended annual dental exams for proper oral hygiene  Community Resource Referral / Chronic Care Management: CRR required this visit?  No   CCM required this visit?  No  Plan:     I have personally reviewed and noted the following in the patients chart:   Medical and social history Use of alcohol, tobacco or illicit drugs  Current medications and supplements including opioid prescriptions.  Functional ability and status Nutritional status Physical activity Advanced directives List of other physicians Hospitalizations, surgeries, and ER visits in previous 12  months Vitals Screenings to include cognitive, depression, and falls Referrals and appointments  In addition, I have reviewed and discussed with patient certain preventive protocols, quality metrics, and best practice recommendations. A written personalized care plan for preventive services as well as general preventive health recommendations were provided to patient.     Dionisio David, LPN   04/25/9492   Nurse Notes: no

## 2021-04-29 NOTE — Patient Instructions (Signed)
April Pittman , Thank you for taking time to come for your Medicare Wellness Visit. I appreciate your ongoing commitment to your health goals. Please review the following plan we discussed and let me know if I can assist you in the future.   Screening recommendations/referrals: Colonoscopy: aged out Mammogram: aged out Bone Density: 12/14/18 Recommended yearly ophthalmology/optometry visit for glaucoma screening and checkup Recommended yearly dental visit for hygiene and checkup  Vaccinations: Influenza vaccine: 02/10/21 Pneumococcal vaccine: 11/07/13 Tdap vaccine: n/d Shingles vaccine: n/d   Covid-19:03/08/19, 03/29/19, 02/10/20, 03/06/21  Advanced directives: no  Conditions/risks identified: none  Next appointment: Follow up in one year for your annual wellness visit    Preventive Care 28 Years and Older, Female Preventive care refers to lifestyle choices and visits with your health care provider that can promote health and wellness. What does preventive care include? A yearly physical exam. This is also called an annual well check. Dental exams once or twice a year. Routine eye exams. Ask your health care provider how often you should have your eyes checked. Personal lifestyle choices, including: Daily care of your teeth and gums. Regular physical activity. Eating a healthy diet. Avoiding tobacco and drug use. Limiting alcohol use. Practicing safe sex. Taking low-dose aspirin every day. Taking vitamin and mineral supplements as recommended by your health care provider. What happens during an annual well check? The services and screenings done by your health care provider during your annual well check will depend on your age, overall health, lifestyle risk factors, and family history of disease. Counseling  Your health care provider may ask you questions about your: Alcohol use. Tobacco use. Drug use. Emotional well-being. Home and relationship well-being. Sexual  activity. Eating habits. History of falls. Memory and ability to understand (cognition). Work and work Statistician. Reproductive health. Screening  You may have the following tests or measurements: Height, weight, and BMI. Blood pressure. Lipid and cholesterol levels. These may be checked every 5 years, or more frequently if you are over 79 years old. Skin check. Lung cancer screening. You may have this screening every year starting at age 82 if you have a 30-pack-year history of smoking and currently smoke or have quit within the past 15 years. Fecal occult blood test (FOBT) of the stool. You may have this test every year starting at age 77. Flexible sigmoidoscopy or colonoscopy. You may have a sigmoidoscopy every 5 years or a colonoscopy every 10 years starting at age 29. Hepatitis C blood test. Hepatitis B blood test. Sexually transmitted disease (STD) testing. Diabetes screening. This is done by checking your blood sugar (glucose) after you have not eaten for a while (fasting). You may have this done every 1-3 years. Bone density scan. This is done to screen for osteoporosis. You may have this done starting at age 70. Mammogram. This may be done every 1-2 years. Talk to your health care provider about how often you should have regular mammograms. Talk with your health care provider about your test results, treatment options, and if necessary, the need for more tests. Vaccines  Your health care provider may recommend certain vaccines, such as: Influenza vaccine. This is recommended every year. Tetanus, diphtheria, and acellular pertussis (Tdap, Td) vaccine. You may need a Td booster every 10 years. Zoster vaccine. You may need this after age 2. Pneumococcal 13-valent conjugate (PCV13) vaccine. One dose is recommended after age 3. Pneumococcal polysaccharide (PPSV23) vaccine. One dose is recommended after age 59. Talk to your health care provider about  which screenings and vaccines  you need and how often you need them. This information is not intended to replace advice given to you by your health care provider. Make sure you discuss any questions you have with your health care provider. Document Released: 03/07/2015 Document Revised: 10/29/2015 Document Reviewed: 12/10/2014 Elsevier Interactive Patient Education  2017 [place] Prevention in the Home Falls can cause injuries. They can happen to people of all ages. There are many things you can do to make your home safe and to help prevent falls. What can I do on the outside of my home? Regularly fix the edges of walkways and driveways and fix any cracks. Remove anything that might make you trip as you walk through a door, such as a raised step or threshold. Trim any bushes or trees on the path to your home. Use bright outdoor lighting. Clear any walking paths of anything that might make someone trip, such as rocks or tools. Regularly check to see if handrails are loose or broken. Make sure that both sides of any steps have handrails. Any raised decks and porches should have guardrails on the edges. Have any leaves, snow, or ice cleared regularly. Use sand or salt on walking paths during winter. Clean up any spills in your garage right away. This includes oil or grease spills. What can I do in the bathroom? Use night lights. Install grab bars by the toilet and in the tub and shower. Do not use towel bars as grab bars. Use non-skid mats or decals in the tub or shower. If you need to sit down in the shower, use a plastic, non-slip stool. Keep the floor dry. Clean up any water that spills on the floor as soon as it happens. Remove soap buildup in the tub or shower regularly. Attach bath mats securely with double-sided non-slip rug tape. Do not have throw rugs and other things on the floor that can make you trip. What can I do in the bedroom? Use night lights. Make sure that you have a light by your bed that  is easy to reach. Do not use any sheets or blankets that are too big for your bed. They should not hang down onto the floor. Have a firm chair that has side arms. You can use this for support while you get dressed. Do not have throw rugs and other things on the floor that can make you trip. What can I do in the kitchen? Clean up any spills right away. Avoid walking on wet floors. Keep items that you use a lot in easy-to-reach places. If you need to reach something above you, use a strong step stool that has a grab bar. Keep electrical cords out of the way. Do not use floor polish or wax that makes floors slippery. If you must use wax, use non-skid floor wax. Do not have throw rugs and other things on the floor that can make you trip. What can I do with my stairs? Do not leave any items on the stairs. Make sure that there are handrails on both sides of the stairs and use them. Fix handrails that are broken or loose. Make sure that handrails are as long as the stairways. Check any carpeting to make sure that it is firmly attached to the stairs. Fix any carpet that is loose or worn. Avoid having throw rugs at the top or bottom of the stairs. If you do have throw rugs, attach them to the floor with  carpet tape. Make sure that you have a light switch at the top of the stairs and the bottom of the stairs. If you do not have them, ask someone to add them for you. What else can I do to help prevent falls? Wear shoes that: Do not have high heels. Have rubber bottoms. Are comfortable and fit you well. Are closed at the toe. Do not wear sandals. If you use a stepladder: Make sure that it is fully opened. Do not climb a closed stepladder. Make sure that both sides of the stepladder are locked into place. Ask someone to hold it for you, if possible. Clearly mark and make sure that you can see: Any grab bars or handrails. First and last steps. Where the edge of each step is. Use tools that help you  move around (mobility aids) if they are needed. These include: Canes. Walkers. Scooters. Crutches. Turn on the lights when you go into a dark area. Replace any light bulbs as soon as they burn out. Set up your furniture so you have a clear path. Avoid moving your furniture around. If any of your floors are uneven, fix them. If there are any pets around you, be aware of where they are. Review your medicines with your doctor. Some medicines can make you feel dizzy. This can increase your chance of falling. Ask your doctor what other things that you can do to help prevent falls. This information is not intended to replace advice given to you by your health care provider. Make sure you discuss any questions you have with your health care provider. Document Released: 12/05/2008 Document Revised: 07/17/2015 Document Reviewed: 03/15/2014 Elsevier Interactive Patient Education  2017 [employer].

## 2021-05-26 DIAGNOSIS — H353231 Exudative age-related macular degeneration, bilateral, with active choroidal neovascularization: Secondary | ICD-10-CM | POA: Diagnosis not present

## 2021-05-26 DIAGNOSIS — H353211 Exudative age-related macular degeneration, right eye, with active choroidal neovascularization: Secondary | ICD-10-CM | POA: Diagnosis not present

## 2021-05-26 DIAGNOSIS — H353221 Exudative age-related macular degeneration, left eye, with active choroidal neovascularization: Secondary | ICD-10-CM | POA: Diagnosis not present

## 2021-06-15 NOTE — Progress Notes (Signed)
  I,Sulibeya S Dimas,acting as a scribe for Donald Fisher, MD.,have documented all relevant documentation on the behalf of Donald Fisher, MD,as directed by  Donald Fisher, MD while in the presence of Donald Fisher, MD.   Complete physical exam   Patient: April Pittman   DOB: 11/15/1940   80 y.o. Female  MRN: [id number] Visit Date: 06/16/2021  Today's healthcare provider: Donald Fisher, MD   Chief Complaint  Patient presents with   Annual Exam   Diabetes   Subjective    April Pittman is a 80 y.o. female who presents today for a complete physical exam.  She reports consuming a general diet. The patient does not participate in regular exercise at present. She generally feels fairly well. She reports sleeping fairly well. She does not have additional problems to discuss today.  HPI  AWV done :04/29/2021  Past Medical History:  Diagnosis Date   Alopecia    Anginal pain (HCC)    Benign neoplasm of large bowel    CAD (coronary artery disease)    Carpal tunnel syndrome    Complication of anesthesia    vomits with any pain medicine   DM (diabetes mellitus) (HCC)    Erosion of cervix    GERD (gastroesophageal reflux disease)    H/O adenomatous polyp of colon    with sever dysplasia, removed 2000 by colonoscopy   History of chicken pox    Hypercholesteremia    Hypertension    Hypothyroid    Irritable bowel    Malignant melanoma of foot (HCC)    Osteoporosis    PONV (postoperative nausea and vomiting)    Post-menopausal bleeding    Procidentia of uterus    with traction cystocele, managed with gellhorn pessary   Rectal bleeding    Sepsis (HCC) 02/18/2018   TIA (transient ischemic attack)    Past Surgical History:  Procedure Laterality Date   BREAST EXCISIONAL BIOPSY Right    Benign   CARDIAC CATHETERIZATION  10/2010   +stenting to LAD and RCA; symptoms: chest pain, elevated Troponin, +AMI. [place]   CATARACT EXTRACTION W/PHACO Right 07/28/2016   Procedure:  CATARACT EXTRACTION PHACO AND INTRAOCULAR LENS PLACEMENT (IOC)  Right Diabetic;  Surgeon: Brasington, Chadwick, MD;  Location: [facility];  Service: Ophthalmology;  Laterality: Right;  Diabetic leave patient at 9 arrival   CATARACT EXTRACTION W/PHACO Left 10/27/2016   Procedure: CATARACT EXTRACTION PHACO AND INTRAOCULAR LENS PLACEMENT (IOC) LEFT DIABETIC;  Surgeon: Brasington, Chadwick, MD;  Location: [facility];  Service: Ophthalmology;  Laterality: Left;   COLONOSCOPY  03/2008   2 polyps, internal and external hemorrhoids, +anal growth. Dr. Elliott   COLONOSCOPY WITH PROPOFOL N/A 05/12/2018   Procedure: COLONOSCOPY WITH PROPOFOL;  Surgeon: Elliott, Robert T, MD;  Location: [facility];  Service: Endoscopy;  Laterality: N/A;   ESOPHAGOGASTRODUODENOSCOPY (EGD) WITH PROPOFOL N/A 05/12/2018   Procedure: ESOPHAGOGASTRODUODENOSCOPY (EGD) WITH PROPOFOL;  Surgeon: Elliott, Robert T, MD;  Location: [facility];  Service: Endoscopy;  Laterality: N/A;   KNEE SURGERY     TONSILLECTOMY     as a child   Social History   Socioeconomic History   Marital status: Married    Spouse name: Not on file   Number of children: 3   Years of education: Not on file   Highest education level: GED or equivalent  Occupational History   Occupation: Retired    Comment: former textile worker  Tobacco Use   Smoking status: Former      Types: Cigarettes    Quit date: 03/03/1988    Years since quitting: 33.3   Smokeless tobacco: Never  Vaping Use   Vaping Use: Never used  Substance and Sexual Activity   Alcohol use: No    Alcohol/week: 0.0 standard drinks   Drug use: No   Sexual activity: Not Currently  Other Topics Concern   Not on file  Social History Narrative   Not on file   Social Determinants of Health   Financial Resource Strain: Low Risk    Difficulty of Paying Living Expenses: Not hard at all  Food Insecurity: No Food Insecurity   Worried About Running Out of Food in the Last  Year: Never true   Ran Out of Food in the Last Year: Never true  Transportation Needs: No Transportation Needs   Lack of Transportation (Medical): No   Lack of Transportation (Non-Medical): No  Physical Activity: Inactive   Days of Exercise per Week: 0 days   Minutes of Exercise per Session: 0 min  Stress: No Stress Concern Present   Feeling of Stress : Only a little  Social Connections: Moderately Isolated   Frequency of Communication with Friends and Family: More than three times a week   Frequency of Social Gatherings with Friends and Family: Twice a week   Attends Religious Services: Never   Active Member of Clubs or Organizations: No   Attends Club or Organization Meetings: Never   Marital Status: Married  Intimate Partner Violence: Not At Risk   Fear of Current or Ex-Partner: No   Emotionally Abused: No   Physically Abused: No   Sexually Abused: No   Family Status  Relation Name Status   Brother  Deceased at age 55   Mother  Deceased at age 32       Cause of Death: Trauma from MVA   Father  Deceased at age 65       Cause of Death: Drowning   Sister  Deceased at age 58   Sister  Alive   PGM  (Not Specified)   Neg Hx  (Not Specified)   Family History  Problem Relation Age of Onset   Lung cancer Brother    Diabetes Father    Heart disease Sister        Valvular rheumatic heart disease   Rheumatic fever Sister    Heart disease Paternal Grandmother    Breast cancer Neg Hx    Ovarian cancer Neg Hx    Colon cancer Neg Hx    No Known Allergies  Patient Care Team: Fisher, Donald E, MD as PCP - General (Family Medicine) Fath, Kenneth A, MD as Consulting Physician (Cardiology) Harris, Robert P, MD as Referring Physician (Obstetrics and Gynecology) Zhang, Yang, MD as Referring Physician (Ophthalmology) Brasington, Chadwick, MD as Referring Physician (Ophthalmology)   Medications: Outpatient Medications Prior to Visit  Medication Sig   amLODipine (NORVASC) 5 MG  tablet Take 5 mg by mouth daily.   aspirin EC 81 MG tablet Take 81 mg by mouth daily.   calcium carbonate (OS-CAL) 1250 (500 Ca) MG chewable tablet Chew 1 tablet by mouth daily.    cholecalciferol (VITAMIN D) 25 MCG (1000 UNIT) tablet Take 5,000 Units by mouth daily.   clopidogrel (PLAVIX) 75 MG tablet Take 1 tablet (75 mg total) by mouth daily.   estradiol (ESTRACE) 0.1 MG/GM vaginal cream Place 1 Applicatorful vaginally once a week.   levothyroxine (SYNTHROID) 125 MCG tablet Take 1 tablet (125 mcg total) by   mouth daily.   lisinopril-hydrochlorothiazide (ZESTORETIC) 20-12.5 MG tablet TAKE 1 TABLET BY MOUTH EVERY DAY   metFORMIN (GLUCOPHAGE) 1000 MG tablet Take 1 tablet (1,000 mg total) by mouth 2 (two) times daily. Take with food   metoprolol tartrate (LOPRESSOR) 50 MG tablet Take 1 tablet (50 mg total) by mouth 2 (two) times daily.   nitroGLYCERIN (NITROSTAT) 0.4 MG SL tablet Place 1 tablet (0.4 mg total) under the tongue every 5 (five) minutes as needed for chest pain.   ONETOUCH ULTRA test strip USE TO CHECK BLOOD SUGAR DAILY. E 11.9 (TYPE 2 DIABETES MELLITIS)   simvastatin (ZOCOR) 40 MG tablet TAKE 1 TABLET BY MOUTH EVERY DAY FOR CHOLESTEROL   vitamin B-12 (CYANOCOBALAMIN) 1000 MCG tablet Take 1,000 mcg by mouth daily.    No facility-administered medications prior to visit.    Review of Systems  Constitutional: Negative.   HENT:  Positive for congestion.   Eyes: Negative.   Respiratory:  Positive for cough and shortness of breath.   Cardiovascular: Negative.   Gastrointestinal: Negative.   Endocrine: Negative.   Genitourinary: Negative.   Musculoskeletal: Negative.   Skin: Negative.   Allergic/Immunologic: Negative.   Neurological:  Positive for headaches.  Hematological: Negative.   Psychiatric/Behavioral: Negative.     Last CBC Lab Results  Component Value Date   WBC 11.4 (H) 06/07/2020   HGB 12.5 06/07/2020   HCT 37.4 06/07/2020   MCV 84.6 06/07/2020   MCH 28.3  06/07/2020   RDW 12.8 06/07/2020   PLT 370 95/28/4132   Last metabolic panel Lab Results  Component Value Date   GLUCOSE 132 (H) 09/19/2020   NA 140 09/19/2020   K 3.8 09/19/2020   CL 95 (L) 09/19/2020   CO2 25 09/19/2020   BUN 8 09/19/2020   CREATININE 0.82 09/19/2020   EGFR 73 09/19/2020   CALCIUM 9.9 09/19/2020   PHOS 3.7 09/19/2020   PROT 7.6 06/07/2020   ALBUMIN 4.4 09/19/2020   LABGLOB 2.7 05/16/2020   AGRATIO 1.7 05/16/2020   BILITOT 0.5 06/07/2020   ALKPHOS 74 06/07/2020   AST 29 06/07/2020   ALT 23 06/07/2020   ANIONGAP 11 06/07/2020   Last lipids Lab Results  Component Value Date   CHOL 158 05/16/2020   HDL 50 05/16/2020   LDLCALC 69 05/16/2020   LDLDIRECT 73 05/11/2017   TRIG 244 (H) 05/16/2020   CHOLHDL 3.2 05/16/2020   Last hemoglobin A1c Lab Results  Component Value Date   HGBA1C 7.4 (A) 02/10/2021      Objective     BP 127/77 (BP Location: Right Arm, Cuff Size: Normal)   Pulse 87   Temp 98.9 F (37.2 C) (Oral)   Resp 16   Ht 5' 2" (1.575 m)   Wt 139 lb 4.8 oz (63.2 kg)   SpO2 100%   BMI 25.48 kg/m  BP Readings from Last 3 Encounters:  06/16/21 127/77  02/10/21 122/73  12/15/20 120/80   Wt Readings from Last 3 Encounters:  06/16/21 139 lb 4.8 oz (63.2 kg)  02/10/21 143 lb 4.8 oz (65 kg)  12/15/20 138 lb (62.6 kg)    Physical Exam   General Appearance:     Well developed, well nourished female. Alert, cooperative, in no acute distress, appears stated age   Head:    Normocephalic, without obvious abnormality, atraumatic  Eyes:    PERRL, conjunctiva/corneas clear, EOM's intact, fundi    benign, both eyes  Ears:    Excessive cerumen bilaterally, obstructing view  of Tms.   Nose:   Nares normal, septum midline, mucosa normal, no drainage    or sinus tenderness  Throat:   Lips, mucosa, and tongue normal; teeth and gums normal  Neck:   Supple, symmetrical, trachea midline, no adenopathy;    thyroid:  no  enlargement/tenderness/nodules; no carotid   bruit or JVD  Back:     Symmetric, no curvature, ROM normal, no CVA tenderness  Lungs:     Clear to auscultation bilaterally, respirations unlabored  Chest Wall:    No tenderness or deformity   Heart:    Normal heart rate. Normal rhythm. No murmurs, rubs, or gallops.    Breast Exam:    deferred  Abdomen:     Soft, non-tender, bowel sounds active all four quadrants,    no masses, no organomegaly  Pelvic:    deferred and not indicated; post-menopausal, no abnormal Pap smears in past  Extremities:   All extremities are intact. No cyanosis or edema  Pulses:   2+ and symmetric all extremities  Skin:   Skin color, texture, turgor normal, no rashes or lesions  Lymph nodes:   Cervical, supraclavicular, and axillary nodes normal  Neurologic:   CNII-XII intact, normal strength, sensation and reflexes    throughout     Last depression screening scores    04/29/2021    2:31 PM 04/23/2020    2:26 PM 01/15/2020   11:17 AM  PHQ 2/9 Scores  PHQ - 2 Score 1 1 0   Last fall risk screening    04/29/2021    2:34 PM  Fall Risk   Falls in the past year? 1  Number falls in past yr: 0  Injury with Fall? 0  Risk for fall due to : History of fall(s)  Follow up Falls prevention discussed   Last Audit-C alcohol use screening    04/29/2021    2:30 PM  Alcohol Use Disorder Test (AUDIT)  1. How often do you have a drink containing alcohol? 0  2. How many drinks containing alcohol do you have on a typical day when you are drinking? 0  3. How often do you have six or more drinks on one occasion? 0  AUDIT-C Score 0   A score of 3 or more in women, and 4 or more in men indicates increased risk for alcohol abuse, EXCEPT if all of the points are from question 1   No results found for any visits on 06/16/21.  Assessment & Plan    Routine Health Maintenance and Physical Exam  Exercise Activities and Dietary recommendations  Goals      DIET - EAT MORE FRUITS  AND VEGETABLES     Increase water intake     Recommend increasing water intake to 6-8 glasses of water a day.          Immunization History  Administered Date(s) Administered   Fluad Quad(high Dose 65+) 11/27/2018, 01/15/2020, 02/10/2021   Influenza, High Dose Seasonal PF 01/29/2015, 12/31/2015, 01/11/2017, 12/15/2017   Moderna Sars-Covid-2 Vaccination 02/10/2020   PFIZER(Purple Top)SARS-COV-2 Vaccination 03/08/2019, 03/29/2019   Pfizer Covid-19 Vaccine Bivalent Booster 12yrs & up 03/06/2021   Pneumococcal Conjugate-13 11/07/2013   Pneumococcal Polysaccharide-23 12/07/2010   Zoster, Live 09/15/2015    Health Maintenance  Topic Date Due   TETANUS/TDAP  Never done   Zoster Vaccines- Shingrix (1 of 2) Never done   FOOT EXAM  05/12/2018   DEXA SCAN  12/13/2020   HEMOGLOBIN A1C  08/11/2021     INFLUENZA VACCINE  09/22/2021   OPHTHALMOLOGY EXAM  01/09/2022   COLONOSCOPY (Pts 45-84yr Insurance coverage will need to be confirmed)  05/12/2023   Pneumonia Vaccine 81 Years old  Completed   COVID-19 Vaccine  Completed   HPV VACCINES  Aged Out    Discussed health benefits of physical activity, and encouraged her to engage in regular exercise appropriate for her age and condition.  1. Osteoporosis, unspecified osteoporosis type, unspecified pathological fracture presence  - DG Bone Density [facility]; Future - VITAMIN D 25 Hydroxy (Vit-D Deficiency, Fractures)  2. Primary hypertension Well controlled.  Continue current medications.   - CBC - Comprehensive metabolic panel - EKG 183-MOQH 3. Coronary artery disease involving native coronary artery of native heart without angina pectoris Asymptomatic. Compliant with medication.  Continue aggressive risk factor modification.   - Lipid panel - EKG 12-Lead  4. Type 2 diabetes mellitus with hyperglycemia, without long-term current use of insulin (HCC)  - Hemoglobin A1c  5. Aortic atherosclerosis (HCC) Asymptomatic. Compliant with  medication.  Continue aggressive risk factor modification.        The entirety of the information documented in the History of Present Illness, Review of Systems and Physical Exam were personally obtained by me. Portions of this information were initially documented by the CMA and reviewed by me for thoroughness and accuracy.     DLelon Huh MD  [phone](phone) [phone](fax)  [place]

## 2021-06-16 ENCOUNTER — Encounter: Payer: Self-pay | Admitting: Family Medicine

## 2021-06-16 ENCOUNTER — Encounter: Payer: PPO | Admitting: Family Medicine

## 2021-06-16 ENCOUNTER — Ambulatory Visit (INDEPENDENT_AMBULATORY_CARE_PROVIDER_SITE_OTHER): Payer: PPO | Admitting: Family Medicine

## 2021-06-16 VITALS — BP 127/77 | HR 87 | Temp 98.9°F | Resp 16 | Ht 62.0 in | Wt 139.3 lb

## 2021-06-16 DIAGNOSIS — I10 Essential (primary) hypertension: Secondary | ICD-10-CM

## 2021-06-16 DIAGNOSIS — I700 Atherosclerosis of aorta: Secondary | ICD-10-CM

## 2021-06-16 DIAGNOSIS — E1165 Type 2 diabetes mellitus with hyperglycemia: Secondary | ICD-10-CM | POA: Diagnosis not present

## 2021-06-16 DIAGNOSIS — Z0000 Encounter for general adult medical examination without abnormal findings: Secondary | ICD-10-CM

## 2021-06-16 DIAGNOSIS — M810 Age-related osteoporosis without current pathological fracture: Secondary | ICD-10-CM | POA: Diagnosis not present

## 2021-06-16 DIAGNOSIS — I2510 Atherosclerotic heart disease of native coronary artery without angina pectoris: Secondary | ICD-10-CM | POA: Diagnosis not present

## 2021-06-16 NOTE — Patient Instructions (Addendum)
.   Please review the attached list of medications and notify my office if there are any errors.   . Please bring all of your medications to every appointment so we can make sure that our medication list is the same as yours.   . Please call the [facility] ([phone]) to schedule a routine screening mammogram.  

## 2021-06-17 LAB — COMPREHENSIVE METABOLIC PANEL
ALT: 15 IU/L (ref 0–32)
AST: 20 IU/L (ref 0–40)
Albumin/Globulin Ratio: 1.7 (ref 1.2–2.2)
Albumin: 4.5 g/dL (ref 3.7–4.7)
Alkaline Phosphatase: 110 IU/L (ref 44–121)
BUN/Creatinine Ratio: 13 (ref 12–28)
BUN: 9 mg/dL (ref 8–27)
Bilirubin Total: 0.3 mg/dL (ref 0.0–1.2)
CO2: 26 mmol/L (ref 20–29)
Calcium: 9.7 mg/dL (ref 8.7–10.3)
Chloride: 96 mmol/L (ref 96–106)
Creatinine, Ser: 0.72 mg/dL (ref 0.57–1.00)
Globulin, Total: 2.7 g/dL (ref 1.5–4.5)
Glucose: 170 mg/dL — ABNORMAL HIGH (ref 70–99)
Potassium: 3.7 mmol/L (ref 3.5–5.2)
Sodium: 139 mmol/L (ref 134–144)
Total Protein: 7.2 g/dL (ref 6.0–8.5)
eGFR: 84 mL/min/{1.73_m2} (ref >59)

## 2021-06-17 LAB — LIPID PANEL
Chol/HDL Ratio: 2.9 ratio (ref 0.0–4.4)
Cholesterol, Total: 156 mg/dL (ref 100–199)
HDL: 53 mg/dL (ref >39)
LDL Chol Calc (NIH): 75 mg/dL (ref 0–99)
Triglycerides: 168 mg/dL — ABNORMAL HIGH (ref 0–149)
VLDL Cholesterol Cal: 28 mg/dL (ref 5–40)

## 2021-06-17 LAB — MICROALBUMIN / CREATININE URINE RATIO
Creatinine, Urine: 261 mg/dL
Microalb/Creat Ratio: 41 mg/g creat — ABNORMAL HIGH (ref 0–29)
Microalbumin, Urine: 105.9 ug/mL

## 2021-06-17 LAB — SPECIMEN STATUS REPORT

## 2021-06-17 LAB — CBC
Hematocrit: 37.4 % (ref 34.0–46.6)
Hemoglobin: 12.7 g/dL (ref 11.1–15.9)
MCH: 27.9 pg (ref 26.6–33.0)
MCHC: 34 g/dL (ref 31.5–35.7)
MCV: 82 fL (ref 79–97)
Platelets: 338 10*3/uL (ref 150–450)
RBC: 4.56 x10E6/uL (ref 3.77–5.28)
RDW: 12.5 % (ref 11.7–15.4)
WBC: 8.4 10*3/uL (ref 3.4–10.8)

## 2021-06-17 LAB — HEMOGLOBIN A1C
Est. average glucose Bld gHb Est-mCnc: 189 mg/dL
Hgb A1c MFr Bld: 8.2 % — ABNORMAL HIGH (ref 4.8–5.6)

## 2021-06-17 LAB — VITAMIN D 25 HYDROXY (VIT D DEFICIENCY, FRACTURES): Vit D, 25-Hydroxy: 63.9 ng/mL (ref 30.0–100.0)

## 2021-06-18 ENCOUNTER — Other Ambulatory Visit: Payer: Self-pay

## 2021-06-18 DIAGNOSIS — E1165 Type 2 diabetes mellitus with hyperglycemia: Secondary | ICD-10-CM

## 2021-06-18 MED ORDER — GLIPIZIDE ER 2.5 MG PO TB24: 2.5000 mg | ORAL_TABLET | Freq: Every day | ORAL | 1 refills | Status: DC

## 2021-07-13 ENCOUNTER — Ambulatory Visit: Payer: Self-pay | Admitting: *Deleted

## 2021-07-13 ENCOUNTER — Other Ambulatory Visit: Payer: Self-pay | Admitting: *Deleted

## 2021-07-13 DIAGNOSIS — K625 Hemorrhage of anus and rectum: Secondary | ICD-10-CM

## 2021-07-13 DIAGNOSIS — Z5982 Transportation insecurity: Secondary | ICD-10-CM

## 2021-07-13 NOTE — Telephone Encounter (Signed)
  Chief Complaint: rectal bleeding since last week.  Blood on toilet paper.   On blood thinners.   Will need transportation arranged. Symptoms: blood on toilet paper after having a BM Frequency: Every time she has a BM since last week. Pertinent Negatives: Patient denies bleeding into her underwear, no dizziness.  "Only bleeds when I wipe" Disposition: '[]'$ ED /'[]'$ Urgent Care (no appt availability in office) / '[x]'$ Appointment(In office/virtual)/ '[]'$  [facility]/ '[]'$ Home Care/ '[]'$ Refused Recommended Disposition /'[]'$ [facility]/ '[]'$  Follow-up with PCP Additional Notes: Pt needs transportation arranged.   I let her know someone would be contacting her.   I went over s/s to go to the ED for.

## 2021-07-13 NOTE — Telephone Encounter (Signed)
Needs CCM referral.

## 2021-07-13 NOTE — Telephone Encounter (Signed)
Reason for Disposition  MODERATE rectal bleeding (small blood clots, passing blood without stool, or toilet water turns red)  Answer Assessment - Initial Assessment Questions 1. APPEARANCE of BLOOD: "What color is it?" "Is it passed separately, on the surface of the stool, or mixed in with the stool?"      It started last week.  I have blood on my tissue when I wipe.  I think it's a hemorrhoid.    2. AMOUNT: "How much blood was passed?"      It's just on the tissue when I wipe.    I don't have to wear anything in my underwear.      Last week I had red in the water of toilet and a lot on my tissue.   I had 3 blood splatters on the floor Sat. Night. I had blood last night when I had a BM.    3. FREQUENCY: "How many times has blood been passed with the stools?"      I wear a Pessary.    I think hemorrhoids is causing it. 4. ONSET: "When was the blood first seen in the stools?" (Days or weeks)      Started last week. 5. DIARRHEA: "Is there also some diarrhea?" If Yes, ask: "How many diarrhea stools in the past 24 hours?"      No constipation 6. CONSTIPATION: "Do you have constipation?" If Yes, ask: "How bad is it?"     No constipation 7. RECURRENT SYMPTOMS: "Have you had blood in your stools before?" If Yes, ask: "When was the last time?" and "What happened that time?"      No abd cramps 8. BLOOD THINNERS: "Do you take any blood thinners?" (e.g., Coumadin/warfarin, Pradaxa/dabigatran, aspirin)     I had a heart attack and had 3 stents put in. Plavix and an aspirin. 9. OTHER SYMPTOMS: "Do you have any other symptoms?"  (e.g., abdomen pain, vomiting, dizziness, fever)     Denies abd cramping or dizziness. 10. PREGNANCY: "Is there any chance you are pregnant?" "When was your last menstrual period?"       N/A  Protocols used: Rectal Bleeding-A-AH

## 2021-07-14 NOTE — Progress Notes (Unsigned)
     I,Sha'taria Jailee Jaquez,acting as a Education administrator for [employer], PA-C.,have documented all relevant documentation on the behalf of [name], PA-C,as directed by  [name], PA-C while in the presence of [name], PA-C.   Established patient visit   Patient: April Pittman   DOB: [birth year]   81 y.o. Female  MRN: [id number] Visit Date: 07/15/2021  Today's healthcare provider: [name], PA-C   No chief complaint on file.  Subjective    HPI  Patient is being seen today for rectal bleeding  Medications: Outpatient Medications Prior to Visit  Medication Sig   amLODipine (NORVASC) 5 MG tablet Take 5 mg by mouth daily.   aspirin EC 81 MG tablet Take 81 mg by mouth daily.   calcium carbonate (OS-CAL) 1250 (500 Ca) MG chewable tablet Chew 1 tablet by mouth daily.    cholecalciferol (VITAMIN D) 25 MCG (1000 UNIT) tablet Take 5,000 Units by mouth daily.   clopidogrel (PLAVIX) 75 MG tablet Take 1 tablet (75 mg total) by mouth daily.   estradiol (ESTRACE) 0.1 MG/GM vaginal cream Place 1 Applicatorful vaginally once a week.   glipiZIDE (GLUCOTROL XL) 2.5 MG 24 hr tablet Take 1 tablet (2.5 mg total) by mouth daily with breakfast.   levothyroxine (SYNTHROID) 125 MCG tablet Take 1 tablet (125 mcg total) by mouth daily.   lisinopril-hydrochlorothiazide (ZESTORETIC) 20-12.5 MG tablet TAKE 1 TABLET BY MOUTH EVERY DAY   metFORMIN (GLUCOPHAGE) 1000 MG tablet Take 1 tablet (1,000 mg total) by mouth 2 (two) times daily. Take with food   metoprolol tartrate (LOPRESSOR) 50 MG tablet Take 1 tablet (50 mg total) by mouth 2 (two) times daily.   nitroGLYCERIN (NITROSTAT) 0.4 MG SL tablet Place 1 tablet (0.4 mg total) under the tongue every 5 (five) minutes as needed for chest pain.   ONETOUCH ULTRA test strip USE TO CHECK BLOOD SUGAR DAILY. E 11.9 (TYPE 2 DIABETES MELLITIS)   simvastatin (ZOCOR) 40 MG tablet TAKE 1 TABLET BY MOUTH EVERY DAY FOR CHOLESTEROL   vitamin B-12 (CYANOCOBALAMIN)  1000 MCG tablet Take 1,000 mcg by mouth daily.    No facility-administered medications prior to visit.    Review of Systems  {Labs  Heme  Chem  Endocrine  Serology  Results Review (optional):23779}   Objective    There were no vitals taken for this visit. {Show previous vital signs (optional):23777}  Physical Exam  ***  No results found for any visits on 07/15/21.  Assessment & Plan     ***  No follow-ups on file.      {provider attestation***:1}   [name], PA-C  [facility] [phone] (phone) [phone] (fax)  [place]

## 2021-07-14 NOTE — Addendum Note (Signed)
Addended by: Barnie Mort on: 07/14/2021 10:11 AM   Modules accepted: Orders

## 2021-07-15 ENCOUNTER — Ambulatory Visit: Payer: PPO | Admitting: Physician Assistant

## 2021-07-15 ENCOUNTER — Telehealth: Payer: Self-pay | Admitting: *Deleted

## 2021-07-15 ENCOUNTER — Telehealth: Payer: Self-pay

## 2021-07-15 NOTE — Telephone Encounter (Signed)
   Telephone encounter was:  Successful.  07/15/2021 Name: April Pittman MRN: [id number] DOB: June 12, 1940  April Pittman is a 81 y.o. year old female who is a primary care patient of Fisher, Kirstie Peri, MD . The community resource team was consulted for assistance with Transportation Needs   Care guide performed the following interventions: Patient booked with [facility].  Follow Up Plan:  No further follow up planned at this time. The patient has been provided with needed resources.  [place], Care Management  [phone] [address] [place] , [place] [address] Email : [email or web address] [email or web address]

## 2021-07-15 NOTE — Telephone Encounter (Signed)
Pt called to r/s appt due to no transportation.  Wanted to r/s to 5/30 due to another appt and transportation already lined up.    Pt states she had BM last night and no blood in her stool.

## 2021-07-15 NOTE — Telephone Encounter (Signed)
   Telephone encounter was:  Successful.  07/15/2021 Name: April Pittman MRN: [id number] DOB: [birth year]  April Pittman is a 81 y.o. year old female who is a primary care patient of Fisher, Kirstie Peri, MD . The community resource team was consulted for assistance with Transportation Needs   Care guide performed the following interventions: Patient provided with information about care guide support team and interviewed to confirm resource needs Discussed resources to assist with Transportation  .  Follow Up Plan:  Care guide will follow up with patient by phone over the next day  [place], Care Management  [phone] [address] , [place] [address] Email : [email or web address] [email or web address]

## 2021-07-21 ENCOUNTER — Ambulatory Visit (INDEPENDENT_AMBULATORY_CARE_PROVIDER_SITE_OTHER): Payer: PPO | Admitting: Physician Assistant

## 2021-07-21 ENCOUNTER — Encounter: Payer: Self-pay | Admitting: Physician Assistant

## 2021-07-21 VITALS — BP 131/69 | HR 71 | Ht 63.0 in | Wt 142.4 lb

## 2021-07-21 DIAGNOSIS — I2510 Atherosclerotic heart disease of native coronary artery without angina pectoris: Secondary | ICD-10-CM | POA: Diagnosis not present

## 2021-07-21 DIAGNOSIS — I10 Essential (primary) hypertension: Secondary | ICD-10-CM | POA: Diagnosis not present

## 2021-07-21 DIAGNOSIS — I4510 Unspecified right bundle-branch block: Secondary | ICD-10-CM | POA: Diagnosis not present

## 2021-07-21 DIAGNOSIS — K649 Unspecified hemorrhoids: Secondary | ICD-10-CM

## 2021-07-21 MED ORDER — HYDROCORTISONE ACETATE 25 MG RE SUPP: 25.0000 mg | Freq: Two times a day (BID) | RECTAL | 0 refills | Status: DC

## 2021-07-21 NOTE — Progress Notes (Signed)
    I,April Pittman,acting as a Education administrator for [employer], PA-C.,have documented all relevant documentation on the behalf of April Kirschner, PA-C,as directed by  April Kirschner, PA-C while in the presence of April Kirschner, PA-C.   Acute Office Visit  Subjective:     Patient ID: April Pittman, female    DOB: Dec 25, 1940, 81 y.o.   MRN: [id number]  Cc. Rectal bleeding  HPI April Pittman is an 81 y/o female who presents today with a few episodes of rectal bleeding during bowel movements. She reports 'soaking blood' through the toilet paper and in the bowl for a few days after every BM. She stopped her plavix for a few days, but has since restarted. She has not had any episodes in the last few days. Denies straining--but admits to straining this morning. Denies taking any stool softeners or laxatives.  She has been putting Vaseline externally.    Review of Systems  Constitutional:  Negative for malaise/fatigue and weight loss.  Gastrointestinal:  Positive for blood in stool and constipation.      Objective:  Blood pressure 131/69, pulse 71, height '5\' 3"'$  (1.6 m), weight 142 lb 6.4 oz (64.6 kg), SpO2 98 %.   Physical Exam Vitals reviewed.  Constitutional:      Appearance: She is not ill-appearing.  HENT:     Head: Normocephalic.  Eyes:     Conjunctiva/sclera: Conjunctivae normal.  Cardiovascular:     Rate and Rhythm: Normal rate.  Pulmonary:     Effort: Pulmonary effort is normal. No respiratory distress.  Genitourinary:    Rectum: Tenderness and external hemorrhoid present.     Comments: Nonbleeding hemorrhoids Neurological:     General: No focal deficit present.     Mental Status: She is alert and oriented to person, place, and time.  Psychiatric:        Mood and Affect: Mood normal.        Behavior: Behavior normal.    No results found for any visits on 07/21/21.      Assessment & Plan:   Problem List Items Addressed This Visit       Cardiovascular and Mediastinum    Hemorrhoids - Primary    Rx hydrocortisone suppositories.  Pt denies she is constipated--but advised stool softeners Advised pt if bleeding returns to call office. Do not skip plavix more than three days.         Relevant Medications   hydrocortisone (ANUSOL-HC) 25 MG suppository    I, April Kirschner, PA-C have reviewed all documentation for this visit. The documentation on  07/21/2021  for the exam, diagnosis, procedures, and orders are all accurate and complete.  April Kirschner, PA-C [facility] [address] [place], [place], [address] Office: [phone] Fax: [phone]

## 2021-07-21 NOTE — Assessment & Plan Note (Addendum)
Rx hydrocortisone suppositories.  Pt denies she is constipated--but advised stool softeners Advised pt if bleeding returns to call office. Do not skip plavix more than three days.

## 2021-07-29 ENCOUNTER — Encounter: Payer: Self-pay | Admitting: Oncology

## 2021-08-04 DIAGNOSIS — H353211 Exudative age-related macular degeneration, right eye, with active choroidal neovascularization: Secondary | ICD-10-CM | POA: Diagnosis not present

## 2021-08-04 DIAGNOSIS — H353231 Exudative age-related macular degeneration, bilateral, with active choroidal neovascularization: Secondary | ICD-10-CM | POA: Diagnosis not present

## 2021-08-04 DIAGNOSIS — H353221 Exudative age-related macular degeneration, left eye, with active choroidal neovascularization: Secondary | ICD-10-CM | POA: Diagnosis not present

## 2021-08-11 ENCOUNTER — Encounter: Payer: Self-pay | Admitting: Obstetrics and Gynecology

## 2021-08-11 ENCOUNTER — Ambulatory Visit: Payer: PPO | Admitting: Obstetrics and Gynecology

## 2021-08-11 VITALS — BP 93/54 | HR 58 | Ht 63.0 in | Wt 138.2 lb

## 2021-08-11 DIAGNOSIS — N814 Uterovaginal prolapse, unspecified: Secondary | ICD-10-CM | POA: Diagnosis not present

## 2021-08-11 DIAGNOSIS — Z4689 Encounter for fitting and adjustment of other specified devices: Secondary | ICD-10-CM | POA: Diagnosis not present

## 2021-08-11 NOTE — Progress Notes (Signed)
Patient presents today for pessary maintenance. She states her last follow-up was in October '22 and has had it in since then. Patient states no irritation or trouble with incontinence. Patient states no questions or concerns at this time.

## 2021-08-11 NOTE — Progress Notes (Signed)
HPI:      [name] is a 81 y.o. [phone] who LMP was No LMP recorded. Patient is postmenopausal.  Subjective:   She presents today for pessary care.  She reports that she is not having any issues with the pessary even has not been checked over the cleanest since October.  She says that she has had it for many many years.  She denies vaginal bleeding or discharge.    Hx: The following portions of the patient's history were reviewed and updated as appropriate:             She  has a past medical history of Alopecia, Anginal pain ([place]), Benign neoplasm of large bowel, CAD (coronary artery disease), Carpal tunnel syndrome, Complication of anesthesia, DM (diabetes mellitus) ([place]), Erosion of cervix, GERD (gastroesophageal reflux disease), H/O adenomatous polyp of colon, History of chicken pox, Hypercholesteremia, Hypertension, Hypothyroid, Irritable bowel, Malignant melanoma of foot ([place]), Osteoporosis, PONV (postoperative nausea and vomiting), Post-menopausal bleeding, Procidentia of uterus, Rectal bleeding, Sepsis ([place]) (02/18/2018), and TIA (transient ischemic attack). She does not have any pertinent problems on file. She  has a past surgical history that includes Tonsillectomy; Knee surgery; Cardiac catheterization (10/2010); Colonoscopy (03/2008); Breast excisional biopsy (Right); Cataract extraction w/PHACO (Right, 07/28/2016); Cataract extraction w/PHACO (Left, 10/27/2016); Esophagogastroduodenoscopy (egd) with propofol (N/A, 05/12/2018); and Colonoscopy with propofol (N/A, 05/12/2018). Her family history includes Diabetes in her father; Heart disease in her paternal grandmother and sister; Lung cancer in her brother; Rheumatic fever in her sister. She  reports that she quit smoking about 33 years ago. Her smoking use included cigarettes. She has never used smokeless tobacco. She reports that she does not drink alcohol and does not use drugs. She has a current medication list which includes the  following prescription(s): amlodipine, aspirin ec, calcium carbonate, cholecalciferol, clopidogrel, estradiol, glipizide, hydrocortisone, levothyroxine, lisinopril-hydrochlorothiazide, metformin, metoprolol tartrate, nitroglycerin, onetouch ultra, simvastatin, and vitamin b-12. She has No Known Allergies.       Review of Systems:  Review of Systems  Constitutional: Denied constitutional symptoms, night sweats, recent illness, fatigue, fever, insomnia and weight loss.  Eyes: Denied eye symptoms, eye pain, photophobia, vision change and visual disturbance.  Ears/Nose/Throat/Neck: Denied ear, nose, throat or neck symptoms, hearing loss, nasal discharge, sinus congestion and sore throat.  Cardiovascular: Denied cardiovascular symptoms, arrhythmia, chest pain/pressure, edema, exercise intolerance, orthopnea and palpitations.  Respiratory: Denied pulmonary symptoms, asthma, pleuritic pain, productive sputum, cough, dyspnea and wheezing.  Gastrointestinal: Denied, gastro-esophageal reflux, melena, nausea and vomiting.  Genitourinary: Denied genitourinary symptoms including symptomatic vaginal discharge, pelvic relaxation issues, and urinary complaints.  Musculoskeletal: Denied musculoskeletal symptoms, stiffness, swelling, muscle weakness and myalgia.  Dermatologic: Denied dermatology symptoms, rash and scar.  Neurologic: Denied neurology symptoms, dizziness, headache, neck pain and syncope.  Psychiatric: Denied psychiatric symptoms, anxiety and depression.  Endocrine: Denied endocrine symptoms including hot flashes and night sweats.   Meds:   Current Outpatient Medications on File Prior to Visit  Medication Sig Dispense Refill   amLODipine (NORVASC) 5 MG tablet Take 5 mg by mouth daily.     aspirin EC 81 MG tablet Take 81 mg by mouth daily.     calcium carbonate (OS-CAL) 1250 (500 Ca) MG chewable tablet Chew 1 tablet by mouth daily.      cholecalciferol (VITAMIN D) 25 MCG (1000 UNIT) tablet Take  5,000 Units by mouth daily.     clopidogrel (PLAVIX) 75 MG tablet Take 1 tablet (75 mg total) by mouth daily. 90 tablet  1   estradiol (ESTRACE) 0.1 MG/GM vaginal cream Place 1 Applicatorful vaginally once a week.     glipiZIDE (GLUCOTROL XL) 2.5 MG 24 hr tablet Take 1 tablet (2.5 mg total) by mouth daily with breakfast. 90 tablet 1   hydrocortisone (ANUSOL-HC) 25 MG suppository Place 1 suppository (25 mg total) rectally 2 (two) times daily. 12 suppository 0   levothyroxine (SYNTHROID) 125 MCG tablet Take 1 tablet (125 mcg total) by mouth daily. 90 tablet 4   lisinopril-hydrochlorothiazide (ZESTORETIC) 20-12.5 MG tablet TAKE 1 TABLET BY MOUTH EVERY DAY 90 tablet 0   metFORMIN (GLUCOPHAGE) 1000 MG tablet Take 1 tablet (1,000 mg total) by mouth 2 (two) times daily. Take with food 180 tablet 4   metoprolol tartrate (LOPRESSOR) 50 MG tablet Take 1 tablet (50 mg total) by mouth 2 (two) times daily. 60 tablet 12   nitroGLYCERIN (NITROSTAT) 0.4 MG SL tablet Place 1 tablet (0.4 mg total) under the tongue every 5 (five) minutes as needed for chest pain. 30 tablet 0   ONETOUCH ULTRA test strip USE TO CHECK BLOOD SUGAR DAILY. E 11.9 (TYPE 2 DIABETES MELLITIS) 100 strip 4   simvastatin (ZOCOR) 40 MG tablet TAKE 1 TABLET BY MOUTH EVERY DAY FOR CHOLESTEROL 90 tablet 4   vitamin B-12 (CYANOCOBALAMIN) 1000 MCG tablet Take 1,000 mcg by mouth daily.      No current facility-administered medications on file prior to visit.      Objective:     Vitals:   08/11/21 1001  BP: (!) 93/54  Pulse: (!) 58   Filed Weights   08/11/21 1001  Weight: 138 lb 3.2 oz (62.7 kg)              Pessary Care Pessary removed and cleaned.  Vagina checked by speculum exam- without erosions - pessary replaced.  ([place])           Assessment:    [id number] Patient Active Problem List   Diagnosis Date Noted   Hemorrhoids 07/21/2021   Cystocele with prolapse 02/04/2020   PAC (premature atrial contraction) 05/11/2019    Short PR-normal QRS complex syndrome 05/11/2019   Right bundle branch block 05/11/2019   Aortic atherosclerosis ([place]) 01/05/2019   Thrombocytosis 04/14/2018   Borderline blood pressure 04/14/2018   Hiatal hernia 03/24/2018   Cholelithiases 03/24/2018   Iron deficiency anemia 03/04/2018   Cataract cortical, senile, bilateral 05/11/2017   Primary osteoarthritis of right knee 11/02/2015   Vitamin D deficiency 08/14/2014   Rectal/anal hemorrhage 06/14/2014   Hypothyroidism 06/14/2014   Hypertension 06/14/2014   Hypercholesteremia 06/14/2014   TIA (transient ischemic attack) 06/14/2014   Gastroesophageal reflux disease 06/14/2014   Alopecia 06/14/2014   Malignant melanoma of foot ([place]) 06/14/2014   Irritable bowel 06/14/2014   Coronary artery disease 06/14/2014   Type 2 diabetes mellitus with hyperglycemia, without long-term current use of insulin ([place]) 06/14/2014   Carpal tunnel syndrome 06/14/2014   Procidentia of uterus 06/14/2014   Osteoporosis 06/14/2014   History of adenomatous polyp of colon 06/14/2014   B12 deficiency 08/08/2008   Colon, diverticulosis 05/14/2008     1. Encounter for pessary maintenance   2. Cystocele with prolapse     Patient doing well with pessary   Plan:            1.  Continue pessary use.  Recommend closer follow-up. (Maximum of 6 months) Orders No orders of the defined types were placed in this encounter.   No orders of the defined types were  placed in this encounter.     F/U  Return in about 6 months (around 02/10/2022). I spent 22 minutes involved in the care of this patient preparing to see the patient by obtaining and reviewing her medical history (including labs, imaging tests and prior procedures), documenting clinical information in the electronic health record (EHR), counseling and coordinating care plans, writing and sending prescriptions, ordering tests or procedures and in direct communicating with the patient and medical staff  discussing pertinent items from her history and physical exam.  Finis Bud, M.D. 08/11/2021 10:47 AM

## 2021-08-19 ENCOUNTER — Other Ambulatory Visit: Payer: PPO

## 2021-08-31 DIAGNOSIS — H26492 Other secondary cataract, left eye: Secondary | ICD-10-CM | POA: Diagnosis not present

## 2021-09-03 ENCOUNTER — Other Ambulatory Visit: Payer: Self-pay | Admitting: Family Medicine

## 2021-09-03 DIAGNOSIS — I2510 Atherosclerotic heart disease of native coronary artery without angina pectoris: Secondary | ICD-10-CM

## 2021-09-03 NOTE — Telephone Encounter (Signed)
Requested Prescriptions  Pending Prescriptions Disp Refills  . clopidogrel (PLAVIX) 75 MG tablet [Pharmacy Med Name: CLOPIDOGREL 75 MG TABLET] 90 tablet 1    Sig: TAKE 1 TABLET BY MOUTH EVERY DAY     Hematology: Antiplatelets - clopidogrel Passed - 09/03/2021  3:08 AM      Passed - HCT in normal range and within 180 days    Hematocrit  Date Value Ref Range Status  06/16/2021 37.4 34.0 - 46.6 % Final         Passed - HGB in normal range and within 180 days    Hemoglobin  Date Value Ref Range Status  06/16/2021 12.7 11.1 - 15.9 g/dL Final         Passed - PLT in normal range and within 180 days    Platelets  Date Value Ref Range Status  06/16/2021 338 150 - 450 x10E3/uL Final         Passed - Cr in normal range and within 360 days    Creatinine, Ser  Date Value Ref Range Status  06/16/2021 0.72 0.57 - 1.00 mg/dL Final   Creatinine, POC  Date Value Ref Range Status  10/05/2016 n/a mg/dL Final         Passed - Valid encounter within last 6 months    Recent Outpatient Visits          1 month ago Hemorrhoids, unspecified hemorrhoid type   [facility] [name], PA-C   2 months ago Annual physical exam   [facility] Birdie Sons, MD   6 months ago Type 2 diabetes mellitus with hyperglycemia, without long-term current use of insulin [facility])   [facility] Birdie Sons, MD   11 months ago Type 2 diabetes mellitus with hyperglycemia, without long-term current use of insulin [facility])   [facility] Birdie Sons, MD   1 year ago Type 2 diabetes mellitus with hyperglycemia, without long-term current use of insulin [facility])   [facility] Birdie Sons, MD      Future Appointments            In 2 weeks Fisher, Kirstie Peri, MD [facility]

## 2021-09-08 DIAGNOSIS — H26492 Other secondary cataract, left eye: Secondary | ICD-10-CM | POA: Diagnosis not present

## 2021-09-18 ENCOUNTER — Encounter: Payer: Self-pay | Admitting: Physician Assistant

## 2021-09-18 ENCOUNTER — Ambulatory Visit: Payer: PPO | Admitting: Family Medicine

## 2021-09-18 ENCOUNTER — Ambulatory Visit (INDEPENDENT_AMBULATORY_CARE_PROVIDER_SITE_OTHER): Payer: PPO | Admitting: Physician Assistant

## 2021-09-18 VITALS — BP 99/70 | HR 66 | Temp 98.2°F | Resp 16 | Wt 142.2 lb

## 2021-09-18 DIAGNOSIS — I10 Essential (primary) hypertension: Secondary | ICD-10-CM

## 2021-09-18 DIAGNOSIS — R399 Unspecified symptoms and signs involving the genitourinary system: Secondary | ICD-10-CM | POA: Diagnosis not present

## 2021-09-18 DIAGNOSIS — R0600 Dyspnea, unspecified: Secondary | ICD-10-CM | POA: Diagnosis not present

## 2021-09-18 DIAGNOSIS — I2510 Atherosclerotic heart disease of native coronary artery without angina pectoris: Secondary | ICD-10-CM | POA: Diagnosis not present

## 2021-09-18 DIAGNOSIS — E1165 Type 2 diabetes mellitus with hyperglycemia: Secondary | ICD-10-CM | POA: Diagnosis not present

## 2021-09-18 DIAGNOSIS — R300 Dysuria: Secondary | ICD-10-CM

## 2021-09-18 LAB — POCT URINALYSIS DIPSTICK
Bilirubin, UA: NEGATIVE
Blood, UA: NEGATIVE
Glucose, UA: POSITIVE — AB
Ketones, UA: NEGATIVE
Leukocytes, UA: NEGATIVE
Nitrite, UA: NEGATIVE
Odor: NORMAL
Protein, UA: NEGATIVE
Spec Grav, UA: 1.015 (ref 1.010–1.025)
Urobilinogen, UA: 0.2 E.U./dL
pH, UA: 6 (ref 5.0–8.0)

## 2021-09-18 LAB — GLUCOSE, POCT (MANUAL RESULT ENTRY): POC Glucose: 178 mg/dl — AB (ref 70–99)

## 2021-09-18 NOTE — Progress Notes (Unsigned)
I,Jana Zayvien Canning,acting as a Education administrator for [employer], PA-C.,have documented all relevant documentation on the behalf of [name], PA-C,as directed by  [employer], PA-C while in the presence of [employer], PA-C.   Established patient visit   Patient: April Pittman   DOB: November 10, 1940   81 y.o. Female  MRN: [id number] Visit Date: 09/18/2021  Today's healthcare provider: [name], PA-C   Chief Complaint  Patient presents with   Diabetes   Hypertension   Subjective    Diabetes Mellitus Type II, Follow-up  Lab Results  Component Value Date   HGBA1C 8.2 (H) 06/16/2021   HGBA1C 7.4 (A) 02/10/2021   HGBA1C 7.6 (A) 09/19/2020   Wt Readings from Last 3 Encounters:  09/18/21 142 lb 3.2 oz (64.5 kg)  08/11/21 138 lb 3.2 oz (62.7 kg)  07/21/21 142 lb 6.4 oz (64.6 kg)   Last seen for diabetes 3 months ago.  Management since then includes: add Glipizide XL 2.5 mg 1 tab in the evening. Reports she is not taking.  She reports poor compliance with treatment. Patient read up on the side effects and decided not to take.  Symptoms: No fatigue No foot ulcerations  No appetite changes No nausea  No paresthesia of the feet  No polydipsia  No polyuria No visual disturbances   No vomiting     Home blood sugar records: fasting range: average 140.  Episodes of hypoglycemia? No    Current insulin regiment:none  Most Recent Eye Exam: 01/09/21 Current exercise: none   Pertinent Labs: Lab Results  Component Value Date   CHOL 156 06/16/2021   HDL 53 06/16/2021   LDLCALC 75 06/16/2021   LDLDIRECT 73 05/11/2017   TRIG 168 (H) 06/16/2021   CHOLHDL 2.9 06/16/2021   Lab Results  Component Value Date   NA 139 06/16/2021   K 3.7 06/16/2021   CREATININE 0.72 06/16/2021   EGFR 84 06/16/2021   MICROALBUR 20 10/05/2016   LABMICR 105.9 06/16/2021     --------------------------------------------------------------------------------------------------- Hypertension,  follow-up  BP Readings from Last 3 Encounters:  09/18/21 99/70  08/11/21 (!) 93/54  07/21/21 131/69   Wt Readings from Last 3 Encounters:  09/18/21 142 lb 3.2 oz (64.5 kg)  08/11/21 138 lb 3.2 oz (62.7 kg)  07/21/21 142 lb 6.4 oz (64.6 kg)     She was last seen for hypertension 3 months ago.  BP at that visit was 122/77. Management since that visit includes continue current medication. She reports good She is following a Regular diet. She is not exercising. She does not smoke.  Use of agents associated with hypertension: none.   Outside blood pressures are: does not do readings  Former smoker  Pertinent labs Lab Results  Component Value Date   CHOL 156 06/16/2021   HDL 53 06/16/2021   LDLCALC 75 06/16/2021   LDLDIRECT 73 05/11/2017   TRIG 168 (H) 06/16/2021   CHOLHDL 2.9 06/16/2021   Lab Results  Component Value Date   NA 139 06/16/2021   K 3.7 06/16/2021   CREATININE 0.72 06/16/2021   EGFR 84 06/16/2021   GLUCOSE 170 (H) 06/16/2021   TSH 0.423 (L) 09/19/2020     The ASCVD Risk score (Arnett DK, et al., 2019) failed to calculate for the following reasons:   The 2019 ASCVD risk score is only valid for ages 48 to 64  --------------------------------------------------------------------------------------------------- Reports burning with urination x 1-2 weeks.   Medications: Outpatient Medications Prior to Visit  Medication  Sig   amLODipine (NORVASC) 5 MG tablet Take 5 mg by mouth daily.   aspirin EC 81 MG tablet Take 81 mg by mouth daily.   calcium carbonate (OS-CAL) 1250 (500 Ca) MG chewable tablet Chew 1 tablet by mouth daily.    cholecalciferol (VITAMIN D) 25 MCG (1000 UNIT) tablet Take 5,000 Units by mouth daily.   clopidogrel (PLAVIX) 75 MG tablet TAKE 1 TABLET BY MOUTH EVERY DAY   estradiol (ESTRACE) 0.1 MG/GM vaginal cream Place 1 Applicatorful vaginally once a week.   hydrocortisone (ANUSOL-HC) 25 MG suppository Place 1 suppository (25 mg total) rectally  2 (two) times daily.   levothyroxine (SYNTHROID) 125 MCG tablet Take 1 tablet (125 mcg total) by mouth daily.   lisinopril-hydrochlorothiazide (ZESTORETIC) 20-12.5 MG tablet TAKE 1 TABLET BY MOUTH EVERY DAY   metFORMIN (GLUCOPHAGE) 1000 MG tablet Take 1 tablet (1,000 mg total) by mouth 2 (two) times daily. Take with food   metoprolol tartrate (LOPRESSOR) 50 MG tablet Take 1 tablet (50 mg total) by mouth 2 (two) times daily.   nitroGLYCERIN (NITROSTAT) 0.4 MG SL tablet Place 1 tablet (0.4 mg total) under the tongue every 5 (five) minutes as needed for chest pain.   ONETOUCH ULTRA test strip USE TO CHECK BLOOD SUGAR DAILY. E 11.9 (TYPE 2 DIABETES MELLITIS)   simvastatin (ZOCOR) 40 MG tablet TAKE 1 TABLET BY MOUTH EVERY DAY FOR CHOLESTEROL   vitamin B-12 (CYANOCOBALAMIN) 1000 MCG tablet Take 1,000 mcg by mouth daily.    glipiZIDE (GLUCOTROL XL) 2.5 MG 24 hr tablet Take 1 tablet (2.5 mg total) by mouth daily with breakfast. (Patient not taking: Reported on 09/18/2021)   No facility-administered medications prior to visit.    Review of Systems  Constitutional:  Positive for fatigue. Negative for chills and fever.  Respiratory: Negative.    Cardiovascular: Negative.  Negative for chest pain.  Genitourinary:  Positive for dysuria, frequency and urgency.  Psychiatric/Behavioral:  Positive for agitation and sleep disturbance (lost her husband in April of 2023, stays alone at home).     {Labs  Heme  Chem  Endocrine  Serology  Results Review (optional):23779}   Objective    BP 99/70 (BP Location: Left Arm, Patient Position: Sitting, Cuff Size: Normal)   Pulse 66   Temp 98.2 F (36.8 C) (Oral)   Resp 16   Wt 142 lb 3.2 oz (64.5 kg)   SpO2 96%   BMI 25.19 kg/m  {Show previous vital signs (optional):23777}  Physical Exam Vitals reviewed.  Constitutional:      General: She is not in acute distress.    Appearance: Normal appearance. She is well-developed. She is not diaphoretic.  HENT:      Head: Normocephalic and atraumatic.  Eyes:     General: No scleral icterus.    Conjunctiva/sclera: Conjunctivae normal.  Neck:     Thyroid: No thyromegaly.  Cardiovascular:     Rate and Rhythm: Normal rate and regular rhythm.     Pulses: Normal pulses.     Heart sounds: Normal heart sounds. No murmur heard. Pulmonary:     Effort: Pulmonary effort is normal. No respiratory distress.     Breath sounds: Normal breath sounds. No wheezing, rhonchi or rales.  Musculoskeletal:     Cervical back: Neck supple.     Right lower leg: No edema.     Left lower leg: No edema.  Lymphadenopathy:     Cervical: No cervical adenopathy.  Skin:    General: Skin is warm  and dry.     Findings: No rash.  Neurological:     Mental Status: She is alert and oriented to person, place, and time. Mental status is at baseline.  Psychiatric:        Behavior: Behavior normal.        Thought Content: Thought content normal.        Judgment: Judgment normal.     ***  No results found for any visits on 09/18/21.  Assessment & Plan  1. Dysuria *** - POCT Urinalysis Dipstick posititve for gl  2. Type 2 diabetes mellitus with hyperglycemia, without long-term current use of insulin (HCC) *** - Hemoglobin A1c  3. Primary hypertension *** - CBC with Differential/Platelet - Comprehensive metabolic panel  4. Coronary artery disease involving native coronary artery of native heart without angina pectoris *** - CBC with Differential/Platelet - Comprehensive metabolic panel  5. Urinary tract infection symptoms ***  6. Dyspnea, unspecified type *** - Hemoglobin A1c - CBC with Differential/Platelet - Comprehensive metabolic panel   Measure BP at home recommended   No follow-ups on file.      {provider attestation***:1}   [name], Hershal Coria  [facility] [phone] (phone) [phone] (fax)  [place]

## 2021-09-19 NOTE — Progress Notes (Incomplete)
I,April Pittman,acting as a Education administrator for [employer], PA-C.,have documented all relevant documentation on the behalf of April Speak, PA-C,as directed by  [employer], PA-C while in the presence of [employer], PA-C.   Established patient visit   Patient: April Pittman   DOB: March 28, 1940   81 y.o. Female  MRN: [id number] Visit Date: 09/18/2021  Today's healthcare provider: [name], PA-C   Chief Complaint  Patient presents with  . Diabetes  . Hypertension   Subjective    Diabetes Mellitus Type II, Follow-up  Lab Results  Component Value Date   HGBA1C 8.2 (H) 06/16/2021   HGBA1C 7.4 (A) 02/10/2021   HGBA1C 7.6 (A) 09/19/2020   Wt Readings from Last 3 Encounters:  09/18/21 142 lb 3.2 oz (64.5 kg)  08/11/21 138 lb 3.2 oz (62.7 kg)  07/21/21 142 lb 6.4 oz (64.6 kg)   Last seen for diabetes 3 months ago.  Management since then includes: add Glipizide XL 2.5 mg 1 tab in the evening. Reports she is not taking.  She reports poor compliance with treatment. Patient read up on the side effects and decided not to take.  Symptoms: No fatigue No foot ulcerations  No appetite changes No nausea  No paresthesia of the feet  No polydipsia  No polyuria No visual disturbances   No vomiting     Home blood sugar records: fasting range: average 140.  Episodes of hypoglycemia? No    Current insulin regiment:none  Most Recent Eye Exam: 01/09/21 Current exercise: none   Pertinent Labs: Lab Results  Component Value Date   CHOL 156 06/16/2021   HDL 53 06/16/2021   LDLCALC 75 06/16/2021   LDLDIRECT 73 05/11/2017   TRIG 168 (H) 06/16/2021   CHOLHDL 2.9 06/16/2021   Lab Results  Component Value Date   NA 139 06/16/2021   K 3.7 06/16/2021   CREATININE 0.72 06/16/2021   EGFR 84 06/16/2021   MICROALBUR 20 10/05/2016   LABMICR 105.9 06/16/2021     --------------------------------------------------------------------------------------------------- Hypertension,  follow-up  BP Readings from Last 3 Encounters:  09/18/21 99/70  08/11/21 (!) 93/54  07/21/21 131/69   Wt Readings from Last 3 Encounters:  09/18/21 142 lb 3.2 oz (64.5 kg)  08/11/21 138 lb 3.2 oz (62.7 kg)  07/21/21 142 lb 6.4 oz (64.6 kg)     She was last seen for hypertension 3 months ago.  BP at that visit was 122/77. Management since that visit includes continue current medication. She reports good She is following a Regular diet. She is not exercising. She does not smoke.  Use of agents associated with hypertension: none.   Outside blood pressures are: does not do readings  Former smoker  Pertinent labs Lab Results  Component Value Date   CHOL 156 06/16/2021   HDL 53 06/16/2021   LDLCALC 75 06/16/2021   LDLDIRECT 73 05/11/2017   TRIG 168 (H) 06/16/2021   CHOLHDL 2.9 06/16/2021   Lab Results  Component Value Date   NA 139 06/16/2021   K 3.7 06/16/2021   CREATININE 0.72 06/16/2021   EGFR 84 06/16/2021   GLUCOSE 170 (H) 06/16/2021   TSH 0.423 (L) 09/19/2020     The ASCVD Risk score (Arnett DK, et al., 2019) failed to calculate for the following reasons:   The 2019 ASCVD risk score is only valid for ages 19 to 26  --------------------------------------------------------------------------------------------------- Reports burning with urination x 1-2 weeks.   Medications: Outpatient Medications Prior to Visit  Medication  Sig  . amLODipine (NORVASC) 5 MG tablet Take 5 mg by mouth daily.  Marland Kitchen aspirin EC 81 MG tablet Take 81 mg by mouth daily.  . calcium carbonate (OS-CAL) 1250 (500 Ca) MG chewable tablet Chew 1 tablet by mouth daily.   . cholecalciferol (VITAMIN D) 25 MCG (1000 UNIT) tablet Take 5,000 Units by mouth daily.  . clopidogrel (PLAVIX) 75 MG tablet TAKE 1 TABLET BY MOUTH EVERY DAY  . estradiol (ESTRACE) 0.1 MG/GM vaginal cream Place 1 Applicatorful vaginally once a week.  . hydrocortisone (ANUSOL-HC) 25 MG suppository Place 1 suppository (25 mg total)  rectally 2 (two) times daily.  Marland Kitchen levothyroxine (SYNTHROID) 125 MCG tablet Take 1 tablet (125 mcg total) by mouth daily.  Marland Kitchen lisinopril-hydrochlorothiazide (ZESTORETIC) 20-12.5 MG tablet TAKE 1 TABLET BY MOUTH EVERY DAY  . metFORMIN (GLUCOPHAGE) 1000 MG tablet Take 1 tablet (1,000 mg total) by mouth 2 (two) times daily. Take with food  . metoprolol tartrate (LOPRESSOR) 50 MG tablet Take 1 tablet (50 mg total) by mouth 2 (two) times daily.  . nitroGLYCERIN (NITROSTAT) 0.4 MG SL tablet Place 1 tablet (0.4 mg total) under the tongue every 5 (five) minutes as needed for chest pain.  April Pittman ULTRA test strip USE TO CHECK BLOOD SUGAR DAILY. E 11.9 (TYPE 2 DIABETES MELLITIS)  . simvastatin (ZOCOR) 40 MG tablet TAKE 1 TABLET BY MOUTH EVERY DAY FOR CHOLESTEROL  . vitamin B-12 (CYANOCOBALAMIN) 1000 MCG tablet Take 1,000 mcg by mouth daily.   Marland Kitchen glipiZIDE (GLUCOTROL XL) 2.5 MG 24 hr tablet Take 1 tablet (2.5 mg total) by mouth daily with breakfast. (Patient not taking: Reported on 09/18/2021)   No facility-administered medications prior to visit.    Review of Systems  Constitutional:  Positive for fatigue. Negative for chills and fever.  Respiratory: Negative.    Cardiovascular: Negative.  Negative for chest pain.  Genitourinary:  Positive for dysuria, frequency and urgency.  Psychiatric/Behavioral:  Positive for agitation and sleep disturbance (lost her husband in April of 2023, stays alone at home).     {Labs  Heme  Chem  Endocrine  Serology  Results Review (optional):23779}   Objective    BP 99/70 (BP Location: Left Arm, Patient Position: Sitting, Cuff Size: Normal)   Pulse 66   Temp 98.2 F (36.8 C) (Oral)   Resp 16   Wt 142 lb 3.2 oz (64.5 kg)   SpO2 96%   BMI 25.19 kg/m  {Show previous vital signs (optional):23777}  Physical Exam Vitals reviewed.  Constitutional:      General: She is not in acute distress.    Appearance: Normal appearance. She is well-developed. She is not  diaphoretic.  HENT:     Head: Normocephalic and atraumatic.  Eyes:     General: No scleral icterus.    Conjunctiva/sclera: Conjunctivae normal.  Neck:     Thyroid: No thyromegaly.  Cardiovascular:     Rate and Rhythm: Normal rate and regular rhythm.     Pulses: Normal pulses.     Heart sounds: Normal heart sounds. No murmur heard. Pulmonary:     Effort: Pulmonary effort is normal. No respiratory distress.     Breath sounds: Normal breath sounds. No wheezing, rhonchi or rales.  Musculoskeletal:     Cervical back: Neck supple.     Right lower leg: No edema.     Left lower leg: No edema.  Lymphadenopathy:     Cervical: No cervical adenopathy.  Skin:    General: Skin is warm  and dry.     Findings: No rash.  Neurological:     Mental Status: She is alert and oriented to person, place, and time. Mental status is at baseline.  Psychiatric:        Behavior: Behavior normal.        Thought Content: Thought content normal.        Judgment: Judgment normal.     No results found for any visits on 09/18/21.  Assessment & Plan  1. Dysuria Could be due to UTI or DM - POCT Urinalysis Dipstick posititve for glucose  2. Type 2 diabetes mellitus with hyperglycemia, without long-term current use of insulin (HCC) Chronic. Last A1C was 8.2 - Hemoglobin A1c Low sugar diet and daily exercise encouraged. Continue her current med regimen  3. Primary hypertension Chronic. Stable BP 99/70 - CBC with Differential/Platelet - Comprehensive metabolic panel Measure BP at home recommended Lifestyle modifications via healthy diet and daily exercise Continue current med regimen  4. Coronary artery disease involving native coronary artery of native heart without angina pectoris  - CBC with Differential/Platelet - Comprehensive metabolic panel  5. Urinary tract infection symptoms - UA POCT positive for glucose - POCT glucose 178 Encouraged proper hydration Advised to drink cranberry  juice/supplements  6. Dyspnea, unspecified type  - Hemoglobin A1c - CBC with Differential/Platelet - Comprehensive metabolic panel  FU as scheduled    The patient was advised to call back or seek an in-person evaluation if the symptoms worsen or if the condition fails to improve as anticipated.  I discussed the assessment and treatment plan with the patient. The patient was provided an opportunity to ask questions and all were answered. The patient agreed with the plan and demonstrated an understanding of the instructions.  The entirety of the information documented in the History of Present Illness, Review of Systems and Physical Exam were personally obtained by me. Portions of this information were initially documented by the CMA and reviewed by me for thoroughness and accuracy.  Portions of this note were created using dictation software and may contain typographical errors.   April Speak, PA-C  [facility] [phone] (phone) [phone] (fax)  [place]

## 2021-09-29 ENCOUNTER — Telehealth: Payer: Self-pay

## 2021-09-29 NOTE — Telephone Encounter (Signed)
Patients Sister called asking if someone would call them back and let them know if Chanon can get an xray today.  [phone]

## 2021-09-29 NOTE — Telephone Encounter (Signed)
Copied from [place] [id number]. Topic: General - Other >> Sep 29, 2021 12:33 PM Leitha Schuller wrote: Patient states she was recommended by Ashok Norris on 7-28 to have an x-ray done "across the street from the office"   Patient inquiring if she can have x-ray done today  Please assist patient further

## 2021-09-30 ENCOUNTER — Ambulatory Visit
Admission: RE | Admit: 2021-09-30 | Discharge: 2021-09-30 | Disposition: A | Discharge status: Home / Self Care | Payer: PPO | Source: Ambulatory Visit | Attending: Physician Assistant | Admitting: Physician Assistant

## 2021-09-30 ENCOUNTER — Other Ambulatory Visit: Payer: Self-pay | Admitting: Physician Assistant

## 2021-09-30 ENCOUNTER — Ambulatory Visit
Admission: RE | Admit: 2021-09-30 | Discharge: 2021-09-30 | Disposition: A | Discharge status: Home / Self Care | Payer: PPO | Attending: Physician Assistant | Admitting: Physician Assistant

## 2021-09-30 DIAGNOSIS — R0600 Dyspnea, unspecified: Secondary | ICD-10-CM

## 2021-09-30 DIAGNOSIS — R0602 Shortness of breath: Secondary | ICD-10-CM | POA: Insufficient documentation

## 2021-09-30 DIAGNOSIS — K449 Diaphragmatic hernia without obstruction or gangrene: Secondary | ICD-10-CM | POA: Diagnosis not present

## 2021-09-30 DIAGNOSIS — Z87891 Personal history of nicotine dependence: Secondary | ICD-10-CM | POA: Insufficient documentation

## 2021-09-30 DIAGNOSIS — R918 Other nonspecific abnormal finding of lung field: Secondary | ICD-10-CM | POA: Insufficient documentation

## 2021-09-30 NOTE — Progress Notes (Signed)
Called and speak with pt's sister. Explained that an order was placed and pt can go to [facility] and do CXR anytime today

## 2021-10-01 DIAGNOSIS — Z7984 Long term (current) use of oral hypoglycemic drugs: Secondary | ICD-10-CM | POA: Diagnosis not present

## 2021-10-01 DIAGNOSIS — I25118 Atherosclerotic heart disease of native coronary artery with other forms of angina pectoris: Secondary | ICD-10-CM | POA: Diagnosis not present

## 2021-10-01 DIAGNOSIS — Z7902 Long term (current) use of antithrombotics/antiplatelets: Secondary | ICD-10-CM | POA: Diagnosis not present

## 2021-10-01 DIAGNOSIS — I700 Atherosclerosis of aorta: Secondary | ICD-10-CM | POA: Diagnosis not present

## 2021-10-01 DIAGNOSIS — Z951 Presence of aortocoronary bypass graft: Secondary | ICD-10-CM | POA: Diagnosis not present

## 2021-10-01 DIAGNOSIS — Z7982 Long term (current) use of aspirin: Secondary | ICD-10-CM | POA: Diagnosis not present

## 2021-10-01 DIAGNOSIS — E119 Type 2 diabetes mellitus without complications: Secondary | ICD-10-CM | POA: Diagnosis not present

## 2021-10-01 DIAGNOSIS — H353290 Exudative age-related macular degeneration, unspecified eye, stage unspecified: Secondary | ICD-10-CM | POA: Diagnosis not present

## 2021-10-01 DIAGNOSIS — I10 Essential (primary) hypertension: Secondary | ICD-10-CM | POA: Diagnosis not present

## 2021-10-01 NOTE — Progress Notes (Signed)
Spearman ,   Your CXR results all are within normal limits Except small hiatal hernia which could explain your abnormal lung exam No changes need to be made to medications, and no further tests need to be ordered.  Any questions please reach out to the office or message me on MyChart!  Best, [name], PA-C

## 2021-10-02 ENCOUNTER — Emergency Department: Payer: PPO

## 2021-10-02 ENCOUNTER — Other Ambulatory Visit: Payer: Self-pay

## 2021-10-02 ENCOUNTER — Emergency Department
Admission: EM | Admit: 2021-10-02 | Discharge: 2021-10-02 | Disposition: A | Discharge status: Home / Self Care | Payer: PPO | Attending: Emergency Medicine | Admitting: Emergency Medicine

## 2021-10-02 DIAGNOSIS — M546 Pain in thoracic spine: Secondary | ICD-10-CM | POA: Diagnosis not present

## 2021-10-02 DIAGNOSIS — I10 Essential (primary) hypertension: Secondary | ICD-10-CM | POA: Diagnosis not present

## 2021-10-02 DIAGNOSIS — R079 Chest pain, unspecified: Secondary | ICD-10-CM | POA: Diagnosis not present

## 2021-10-02 LAB — BASIC METABOLIC PANEL
Anion gap: 9 (ref 5–15)
BUN: 7 mg/dL — ABNORMAL LOW (ref 8–23)
CO2: 25 mmol/L (ref 22–32)
Calcium: 8.5 mg/dL — ABNORMAL LOW (ref 8.9–10.3)
Chloride: 104 mmol/L (ref 98–111)
Creatinine, Ser: 0.68 mg/dL (ref 0.44–1.00)
GFR, Estimated: >60 mL/min (ref >60)
Glucose, Bld: 194 mg/dL — ABNORMAL HIGH (ref 70–99)
Potassium: 3.3 mmol/L — ABNORMAL LOW (ref 3.5–5.1)
Sodium: 138 mmol/L (ref 135–145)

## 2021-10-02 LAB — CBC
HCT: 33.8 % — ABNORMAL LOW (ref 36.0–46.0)
Hemoglobin: 10.7 g/dL — ABNORMAL LOW (ref 12.0–15.0)
MCH: 26 pg (ref 26.0–34.0)
MCHC: 31.7 g/dL (ref 30.0–36.0)
MCV: 82.2 fL (ref 80.0–100.0)
Platelets: 342 10*3/uL (ref 150–400)
RBC: 4.11 MIL/uL (ref 3.87–5.11)
RDW: 13.2 % (ref 11.5–15.5)
WBC: 8.6 10*3/uL (ref 4.0–10.5)
nRBC: 0 % (ref 0.0–0.2)

## 2021-10-02 LAB — TROPONIN I (HIGH SENSITIVITY): Troponin I (High Sensitivity): 4 ng/L (ref <18)

## 2021-10-02 MED ORDER — CYCLOBENZAPRINE HCL 10 MG PO TABS: 10.0000 mg | ORAL_TABLET | Freq: Three times a day (TID) | ORAL | 0 refills | Status: DC | PRN

## 2021-10-02 NOTE — ED Triage Notes (Signed)
First nurse note: Sent by [facility]. Patient reports around 10:30 last night feeling sharp shoulder blade pain and heaviness. Denies blurred vision. HX MI and three stents. Reports not taking blood pressure medication this AM.

## 2021-10-02 NOTE — ED Provider Notes (Signed)
[facility] Provider Note   Event Date/Time   First MD Initiated Contact with Patient 10/02/21 1440     (approximate) History  Back Pain  HPI April Pittman is a 81 y.o. female who presents for a left thoracic paraspinal pain that began last night and woke her from sleep.  Patient describes a sharp pain on the left aspect of her spine between her shoulder blades that is worse with movement.  Patient states 7/10 in severity and does not radiate.  Patient is concerned as she had an MI with stent placement in the past but denies that this pain being similar to the pain she experienced during that episode.  Patient denies worsening on exertion. ROS: Patient currently denies any vision changes, tinnitus, difficulty speaking, facial droop, sore throat, chest pain, shortness of breath, abdominal pain, nausea/vomiting/diarrhea, dysuria, or weakness/numbness/paresthesias in any extremity   Physical Exam  Triage Vital Signs: ED Triage Vitals  Enc Vitals Group     BP 10/02/21 1037 132/72     Pulse Rate 10/02/21 1037 62     Resp 10/02/21 1037 18     Temp 10/02/21 1037 98.6 F (37 C)     Temp Source 10/02/21 1037 Oral     SpO2 10/02/21 1037 97 %     Weight 10/02/21 1450 142 lb 3.2 oz (64.5 kg)     Height 10/02/21 1450 '5\' 3"'$  (1.6 m)     Head Circumference --      Peak Flow --      Pain Score 10/02/21 1037 7     Pain Loc --      Pain Edu? --      Excl. in [place]? --    Most recent vital signs: Vitals:   10/02/21 1450 10/02/21 1559  BP: (!) 140/63 (!) 142/60  Pulse: 69 70  Resp: 18 18  Temp: 98 F (36.7 C)   SpO2: 98% 98%   General: Awake, oriented x4. CV:  Good peripheral perfusion.  Resp:  Normal effort.  Abd:  No distention.  Other:  Elderly Caucasian overweight female laying in bed in no acute distress.  Left thoracic paraspinal tenderness to palpation with associated's muscular spasm. ED Results / Procedures / Treatments  Labs (all labs ordered are listed,  but only abnormal results are displayed) Labs Reviewed  BASIC METABOLIC PANEL - Abnormal; Notable for the following components:      Result Value   Potassium 3.3 (*)    Glucose, Bld 194 (*)    BUN 7 (*)    Calcium 8.5 (*)    All other components within normal limits  CBC - Abnormal; Notable for the following components:   Hemoglobin 10.7 (*)    HCT 33.8 (*)    All other components within normal limits  TROPONIN I (HIGH SENSITIVITY)   EKG ED ECG REPORT I, Naaman Plummer, the attending physician, personally viewed and interpreted this ECG. Date: 10/02/2021 EKG Time: 1030 Rate: 64 Rhythm: normal sinus rhythm QRS Axis: normal Intervals: Right bundle branch block ST/T Wave abnormalities: normal Narrative Interpretation: Normal sinus rhythm with right bundle branch block.  No evidence of acute ischemia RADIOLOGY ED MD interpretation: 2 view chest x-ray interpreted by me shows no evidence of acute abnormalities including no pneumonia, pneumothorax, or widened mediastinum -Agree with radiology assessment Official radiology report(s): DG Chest 2 View  Result Date: 10/02/2021 CLINICAL DATA:  Chest pain. EXAM: CHEST - 2 VIEW COMPARISON:  Chest x-ray 09/30/2021. FINDINGS:  The heart size and mediastinal contours are within normal limits. Both lungs are clear. No visible pleural effusions or pneumothorax. No acute osseous abnormality. IMPRESSION: No active cardiopulmonary disease. Electronically Signed   By: Margaretha Sheffield M.D.   On: 10/02/2021 11:16   PROCEDURES: Critical Care performed: No .1-3 Lead EKG Interpretation  Performed by: Naaman Plummer, MD Authorized by: Naaman Plummer, MD     Interpretation: normal     ECG rate:  69   ECG rate assessment: normal     Rhythm: sinus rhythm     Ectopy: none     Conduction: normal    MEDICATIONS ORDERED IN ED: Medications - No data to display IMPRESSION / MDM / [place] / ED COURSE  I reviewed the triage vital signs and  the nursing notes.                             The patient is on the cardiac monitor to evaluate for evidence of arrhythmia and/or significant heart rate changes. Patient's presentation is most consistent with acute presentation with potential threat to life or bodily function. Patient presents for thoracic back pain. Given History and Exam the patient appears to be at low risk for Spinal Cord Compression Syndrome, Vertebral Malignancy/Mets, acute Spinal Fracture, Vertebral Osteomyelitis, Epidural Abscess, Infected or Obstructing Kidney Stone.  Their presentation appears most likely to be secondary to non-emergent musculoskeletal etiology vs non-emergent disc herniation.  ED Workup: Given patient's history of MI, she was worked up with troponin, CBC, BMP, and 2 view chest x-ray did not show any evidence of acute abnormalities except for a stable anemia at 10.7 with hematocrit of 33.8.  Disposition: Discharge. Strict return precautions discussed with patient with full understanding. Advised patient to follow up promptly with primary care provider   FINAL CLINICAL IMPRESSION(S) / ED DIAGNOSES   Final diagnoses:  Acute left-sided thoracic back pain   Rx / DC Orders   ED Discharge Orders          Ordered    cyclobenzaprine (FLEXERIL) 10 MG tablet  3 times daily PRN        10/02/21 1538           Note:  This document was prepared using Dragon voice recognition software and may include unintentional dictation errors.   Naaman Plummer, MD 10/02/21 [phone]

## 2021-10-02 NOTE — ED Notes (Signed)
See triage note  Presents with family from [facility]  States she developed pian to upper back last pm  States pain is in between shoulder blades  Intensity changes

## 2021-10-02 NOTE — ED Triage Notes (Signed)
Pt to ED via POV from home. Pt reports last night feeling sharp pain between shoulder blades and heaviness. Pt reports hx MI with stent placements.

## 2021-10-09 ENCOUNTER — Other Ambulatory Visit: Payer: Self-pay | Admitting: Family Medicine

## 2021-10-13 DIAGNOSIS — H353221 Exudative age-related macular degeneration, left eye, with active choroidal neovascularization: Secondary | ICD-10-CM | POA: Diagnosis not present

## 2021-10-13 DIAGNOSIS — H353211 Exudative age-related macular degeneration, right eye, with active choroidal neovascularization: Secondary | ICD-10-CM | POA: Diagnosis not present

## 2021-10-13 DIAGNOSIS — Z961 Presence of intraocular lens: Secondary | ICD-10-CM | POA: Diagnosis not present

## 2021-10-13 DIAGNOSIS — H353231 Exudative age-related macular degeneration, bilateral, with active choroidal neovascularization: Secondary | ICD-10-CM | POA: Diagnosis not present

## 2021-10-26 ENCOUNTER — Other Ambulatory Visit: Payer: Self-pay | Admitting: Family Medicine

## 2021-10-26 DIAGNOSIS — E1165 Type 2 diabetes mellitus with hyperglycemia: Secondary | ICD-10-CM

## 2021-12-18 ENCOUNTER — Other Ambulatory Visit: Payer: Self-pay | Admitting: Family Medicine

## 2021-12-18 DIAGNOSIS — I2510 Atherosclerotic heart disease of native coronary artery without angina pectoris: Secondary | ICD-10-CM

## 2021-12-21 DIAGNOSIS — H353231 Exudative age-related macular degeneration, bilateral, with active choroidal neovascularization: Secondary | ICD-10-CM | POA: Diagnosis not present

## 2021-12-21 DIAGNOSIS — H353221 Exudative age-related macular degeneration, left eye, with active choroidal neovascularization: Secondary | ICD-10-CM | POA: Diagnosis not present

## 2021-12-21 DIAGNOSIS — H353211 Exudative age-related macular degeneration, right eye, with active choroidal neovascularization: Secondary | ICD-10-CM | POA: Diagnosis not present

## 2021-12-22 ENCOUNTER — Telehealth: Payer: Self-pay

## 2021-12-22 NOTE — Telephone Encounter (Signed)
Copied from [place] [id number]. Topic: Appointment Scheduling - Scheduling Inquiry for Clinic >> Dec 22, 2021  2:39 PM April Pittman wrote: Reason for CRM: Pt asked that a message be sent to Dr. Caryn Section advising him that she is doing fine but she would like to know if he wants her to schedule an appt. Offered to schedule pt for an appt but she declined. Pt requests call back to advise if Dr. Caryn Section wants her to schedule an appt.

## 2021-12-23 NOTE — Telephone Encounter (Signed)
Tried calling patient. No answer; phone kept ringing. OK for PEC to advise and schedule appointment.

## 2021-12-23 NOTE — Telephone Encounter (Signed)
Able to reach pt, states will CB to schedule, needs to check on transportation.

## 2021-12-23 NOTE — Telephone Encounter (Signed)
Yes, she is due for diabetes follow up

## 2022-01-18 DIAGNOSIS — I2510 Atherosclerotic heart disease of native coronary artery without angina pectoris: Secondary | ICD-10-CM | POA: Diagnosis not present

## 2022-01-18 DIAGNOSIS — I700 Atherosclerosis of aorta: Secondary | ICD-10-CM | POA: Diagnosis not present

## 2022-01-18 DIAGNOSIS — I10 Essential (primary) hypertension: Secondary | ICD-10-CM | POA: Diagnosis not present

## 2022-01-18 DIAGNOSIS — E7800 Pure hypercholesterolemia, unspecified: Secondary | ICD-10-CM | POA: Diagnosis not present

## 2022-02-05 ENCOUNTER — Ambulatory Visit: Payer: PPO | Admitting: Physician Assistant

## 2022-02-05 ENCOUNTER — Ambulatory Visit (INDEPENDENT_AMBULATORY_CARE_PROVIDER_SITE_OTHER): Payer: PPO | Admitting: Family Medicine

## 2022-02-05 ENCOUNTER — Encounter: Payer: Self-pay | Admitting: Family Medicine

## 2022-02-05 VITALS — BP 152/81 | HR 71 | Wt 145.9 lb

## 2022-02-05 DIAGNOSIS — E039 Hypothyroidism, unspecified: Secondary | ICD-10-CM

## 2022-02-05 DIAGNOSIS — E1165 Type 2 diabetes mellitus with hyperglycemia: Secondary | ICD-10-CM

## 2022-02-05 DIAGNOSIS — I10 Essential (primary) hypertension: Secondary | ICD-10-CM

## 2022-02-05 DIAGNOSIS — Z23 Encounter for immunization: Secondary | ICD-10-CM | POA: Diagnosis not present

## 2022-02-05 DIAGNOSIS — E538 Deficiency of other specified B group vitamins: Secondary | ICD-10-CM | POA: Diagnosis not present

## 2022-02-05 DIAGNOSIS — M810 Age-related osteoporosis without current pathological fracture: Secondary | ICD-10-CM

## 2022-02-05 DIAGNOSIS — D509 Iron deficiency anemia, unspecified: Secondary | ICD-10-CM

## 2022-02-05 LAB — POCT GLUCOSE (DEVICE FOR HOME USE): POC Glucose: 137 mg/dl — AB (ref 70–99)

## 2022-02-05 LAB — POCT GLYCOSYLATED HEMOGLOBIN (HGB A1C)
Est. average glucose Bld gHb Est-mCnc: 177
Hemoglobin A1C: 7.8 % — AB (ref 4.0–5.6)

## 2022-02-05 MED ORDER — GLIPIZIDE ER 5 MG PO TB24: 5.0000 mg | ORAL_TABLET | Freq: Every day | ORAL | 3 refills | Status: DC

## 2022-02-05 NOTE — Progress Notes (Signed)
I,April Pittman,acting as a Education administrator for [name], MD.,have documented all relevant documentation on the behalf of [name], MD,as directed by  [name], MD while in the presence of [name], MD.   Established patient visit   Patient: April Pittman   DOB: 1940/11/10   81 y.o. Female  MRN: [id number] Visit Date: 02/05/2022  Today's healthcare provider: [name], MD   Chief Complaint  Patient presents with   Diabetes   Hypertension   Hypothyroidism   Subjective    HPI  Diabetes Mellitus Type II, Follow-up  Lab Results  Component Value Date   HGBA1C 8.2 (H) 06/16/2021   HGBA1C 7.4 (A) 02/10/2021   HGBA1C 7.6 (A) 09/19/2020   Wt Readings from Last 3 Encounters:  10/02/21 142 lb 3.2 oz (64.5 kg)  09/18/21 142 lb 3.2 oz (64.5 kg)  08/11/21 138 lb 3.2 oz (62.7 kg)   Last seen for diabetes 5 months ago.  Management since then includes continue current treatment. She reports excellent compliance with treatment. She is not having side effects.  Symptoms: No fatigue No foot ulcerations  Yes appetite changes No nausea  No paresthesia of the feet  No polydipsia  No polyuria Yes visual disturbances   No vomiting     Home blood sugar records: fasting range: 400's this morning  Episodes of hypoglycemia? No      Pertinent Labs: Lab Results  Component Value Date   CHOL 156 06/16/2021   HDL 53 06/16/2021   LDLCALC 75 06/16/2021   LDLDIRECT 73 05/11/2017   TRIG 168 (H) 06/16/2021   CHOLHDL 2.9 06/16/2021   Lab Results  Component Value Date   NA 138 10/02/2021   K 3.3 (L) 10/02/2021   CREATININE 0.68 10/02/2021   GFRNONAA >60 10/02/2021   MICROALBUR 20 10/05/2016   LABMICR 105.9 06/16/2021     ---------------------------------------------------------------------------------------------------  She is also due for follow up hypertension, hyperlipidemia, hypothyroidism.Doing well with current medications. Last thyroid functions Lab  Results  Component Value Date   TSH 0.423 (L) 09/19/2020   T4TOTAL 9.8 04/28/2016     Medications: Outpatient Medications Prior to Visit  Medication Sig   amLODipine (NORVASC) 5 MG tablet Take 5 mg by mouth daily.   aspirin EC 81 MG tablet Take 81 mg by mouth daily.   calcium carbonate (OS-CAL) 1250 (500 Ca) MG chewable tablet Chew 1 tablet by mouth daily.    cholecalciferol (VITAMIN D) 25 MCG (1000 UNIT) tablet Take 5,000 Units by mouth daily.   clopidogrel (PLAVIX) 75 MG tablet TAKE 1 TABLET BY MOUTH EVERY DAY   cyclobenzaprine (FLEXERIL) 10 MG tablet Take 1 tablet (10 mg total) by mouth 3 (three) times daily as needed for muscle spasms.   estradiol (ESTRACE) 0.1 MG/GM vaginal cream Place 1 Applicatorful vaginally once a week.   glipiZIDE (GLUCOTROL XL) 2.5 MG 24 hr tablet TAKE 1 TABLET BY MOUTH DAILY WITH BREAKFAST.   hydrocortisone (ANUSOL-HC) 25 MG suppository Place 1 suppository (25 mg total) rectally 2 (two) times daily.   levothyroxine (SYNTHROID) 125 MCG tablet Take 1 tablet (125 mcg total) by mouth daily.   lisinopril-hydrochlorothiazide (ZESTORETIC) 20-12.5 MG tablet TAKE 1 TABLET BY MOUTH EVERY DAY   metFORMIN (GLUCOPHAGE) 1000 MG tablet Take 1 tablet (1,000 mg total) by mouth 2 (two) times daily. Take with food   metoprolol tartrate (LOPRESSOR) 50 MG tablet Take 1 tablet (50 mg total) by mouth 2 (two) times daily.   nitroGLYCERIN (NITROSTAT) 0.4 MG  SL tablet Place 1 tablet (0.4 mg total) under the tongue every 5 (five) minutes as needed for chest pain.   ONETOUCH ULTRA test strip USE TO CHECK BLOOD SUGAR DAILY. E 11.9 (TYPE 2 DIABETES MELLITIS)   simvastatin (ZOCOR) 40 MG tablet TAKE 1 TABLET BY MOUTH EVERY DAY FOR CHOLESTEROL   vitamin B-12 (CYANOCOBALAMIN) 1000 MCG tablet Take 1,000 mcg by mouth daily.    No facility-administered medications prior to visit.    Review of Systems  Constitutional:  Negative for appetite change, chills, fatigue and fever.  Respiratory:   Negative for chest tightness and shortness of breath.   Cardiovascular:  Negative for chest pain and palpitations.  Gastrointestinal:  Negative for abdominal pain, nausea and vomiting.  Neurological:  Negative for dizziness and weakness.       Objective    BP (!) 152/81 (BP Location: Left Arm, Patient Position: Sitting, Cuff Size: Normal)   Pulse 71   Wt 145 lb 14.4 oz (66.2 kg)   SpO2 98%   BMI 25.85 kg/m    Physical Exam  General appearance: Well developed, well nourished female, cooperative and in no acute distress Head: Normocephalic, without obvious abnormality, atraumatic Respiratory: Respirations even and unlabored, normal respiratory rate Extremities: All extremities are intact.  Skin: Skin color, texture, turgor normal. No rashes seen  Psych: Appropriate mood and affect. Neurologic: Mental status: Alert, oriented to person, place, and time, thought content appropriate.   Results for orders placed or performed in visit on 02/05/22  POCT HgB A1C  Result Value Ref Range   Hemoglobin A1C 7.8 (A) 4.0 - 5.6 %   Est. average glucose Bld gHb Est-mCnc 177   POCT Glucose (Device for Home Use)  Result Value Ref Range   Glucose Fasting, POC     POC Glucose 137 (A) 70 - 99 mg/dl    Assessment & Plan     1. Type 2 diabetes mellitus with hyperglycemia, without long-term current use of insulin (HCC) A1c not quite to goal. Increase from 2.'5mg'$  to glipiZIDE (GLUCOTROL XL) 5 MG 24 hr tablet; Take 1 tablet (5 mg total) by mouth daily with breakfast.  Dispense: 90 tablet; Refill: 3  2. Primary hypertension Not at goal. Anticipate increasing lisinopril-hctz to 20/'25mg'$  daily with next refill in February  - Renal function panel  3. Hypothyroidism, unspecified type  - TSH - T4, free  4. B12 deficiency Lab Results  Component Value Date   VITAMINB12 497 05/16/2020    5. Iron deficiency anemia, unspecified iron deficiency anemia type  - CBC - Vitamin B12 - Iron, TIBC and  Ferritin Panel  6. Osteoporosis, unspecified osteoporosis type, unspecified pathological fracture presence  - DG Bone Density [place]; Future  7. Need for influenza vaccination  - Flu Vaccine QUAD High Dose IM (Fluad)      The entirety of the information documented in the History of Present Illness, Review of Systems and Physical Exam were personally obtained by me. Portions of this information were initially documented by the CMA and reviewed by me for thoroughness and accuracy.     [name], MD  [facility] [phone] (phone) [phone] (fax)  [place]

## 2022-02-05 NOTE — Patient Instructions (Signed)
I'm increasing your dose of glipizide to '5mg'$  to get better control of your blood sugar  I'm going to change the dose of lisinopril-hctz to 20/'25mg'$  when you get your next refill in February

## 2022-02-06 LAB — T4, FREE: Free T4: 2.18 ng/dL — ABNORMAL HIGH (ref 0.82–1.77)

## 2022-02-06 LAB — TSH: TSH: 0.226 u[IU]/mL — ABNORMAL LOW (ref 0.450–4.500)

## 2022-02-06 LAB — IRON,TIBC AND FERRITIN PANEL
Ferritin: 19 ng/mL (ref 15–150)
Iron Saturation: 10 % — ABNORMAL LOW (ref 15–55)
Iron: 41 ug/dL (ref 27–139)
Total Iron Binding Capacity: 423 ug/dL (ref 250–450)
UIBC: 382 ug/dL — ABNORMAL HIGH (ref 118–369)

## 2022-02-06 LAB — CBC
Hematocrit: 37.9 % (ref 34.0–46.6)
Hemoglobin: 12.3 g/dL (ref 11.1–15.9)
MCH: 25.6 pg — ABNORMAL LOW (ref 26.6–33.0)
MCHC: 32.5 g/dL (ref 31.5–35.7)
MCV: 79 fL (ref 79–97)
Platelets: 441 10*3/uL (ref 150–450)
RBC: 4.81 x10E6/uL (ref 3.77–5.28)
RDW: 13.2 % (ref 11.7–15.4)
WBC: 11.1 10*3/uL — ABNORMAL HIGH (ref 3.4–10.8)

## 2022-02-06 LAB — RENAL FUNCTION PANEL
Albumin: 4.5 g/dL (ref 3.7–4.7)
BUN/Creatinine Ratio: 14 (ref 12–28)
BUN: 9 mg/dL (ref 8–27)
CO2: 24 mmol/L (ref 20–29)
Calcium: 9.6 mg/dL (ref 8.7–10.3)
Chloride: 95 mmol/L — ABNORMAL LOW (ref 96–106)
Creatinine, Ser: 0.66 mg/dL (ref 0.57–1.00)
Glucose: 110 mg/dL — ABNORMAL HIGH (ref 70–99)
Phosphorus: 3.2 mg/dL (ref 3.0–4.3)
Potassium: 3.7 mmol/L (ref 3.5–5.2)
Sodium: 137 mmol/L (ref 134–144)
eGFR: 88 mL/min/{1.73_m2} (ref >59)

## 2022-02-06 LAB — VITAMIN B12: Vitamin B-12: 167 pg/mL — ABNORMAL LOW (ref 232–1245)

## 2022-02-09 ENCOUNTER — Telehealth: Payer: Self-pay

## 2022-02-09 NOTE — Telephone Encounter (Unsigned)
Copied from [place] [id number]. Topic: General - Other >> Feb 09, 2022 11:54 AM Cyndi Bender wrote: Reason for CRM: Pt called for A1C results. Pt requests call back to advise. Cb# [phone]

## 2022-02-09 NOTE — Telephone Encounter (Signed)
Tried calling pt. No answer and no vm. Okay for pec triage to advise patient of results. Thanks.

## 2022-02-10 ENCOUNTER — Other Ambulatory Visit: Payer: Self-pay | Admitting: Family Medicine

## 2022-02-10 DIAGNOSIS — E119 Type 2 diabetes mellitus without complications: Secondary | ICD-10-CM

## 2022-02-10 DIAGNOSIS — E039 Hypothyroidism, unspecified: Secondary | ICD-10-CM

## 2022-02-10 NOTE — Telephone Encounter (Signed)
Patient was advised by pec.

## 2022-02-10 NOTE — Telephone Encounter (Signed)
Unable to refill per protocol, Rx request is too soon. Last refill 02/10/21 for 90 and 4 refills. Will refuse.  Requested Prescriptions  Pending Prescriptions Disp Refills   levothyroxine (SYNTHROID) 125 MCG tablet [Pharmacy Med Name: LEVOTHYROXINE 125 MCG TABLET] 90 tablet 4    Sig: TAKE 1 TABLET BY MOUTH EVERY DAY     Endocrinology:  Hypothyroid Agents Failed - 02/10/2022 10:09 AM      Failed - TSH in normal range and within 360 days    TSH  Date Value Ref Range Status  02/05/2022 0.226 (L) 0.450 - 4.500 uIU/mL Final         Passed - Valid encounter within last 12 months    Recent Outpatient Visits           5 days ago Type 2 diabetes mellitus with hyperglycemia, without long-term current use of insulin [facility])   [facility] Birdie Sons, MD   4 months ago [place], [place], PA-C   6 months ago Hemorrhoids, unspecified hemorrhoid type   [facility] Thedore Mins, [place], PA-C   7 months ago Annual physical exam   [facility] Birdie Sons, MD   1 year ago Type 2 diabetes mellitus with hyperglycemia, without long-term current use of insulin [facility])   [place], Kirstie Peri, MD       Future Appointments             In 3 months Fisher, Kirstie Peri, MD [facility], [facility]             metFORMIN (GLUCOPHAGE) 1000 MG tablet [Pharmacy Med Name: METFORMIN HCL 1,000 MG TABLET] 180 tablet 4    Sig: TAKE 1 TABLET (1,000 MG TOTAL) BY MOUTH 2 (TWO) TIMES DAILY. TAKE WITH FOOD     Endocrinology:  Diabetes - Biguanides Failed - 02/10/2022 10:09 AM      Failed - B12 Level in normal range and within 720 days    Vitamin B-12  Date Value Ref Range Status  02/05/2022 167 (L) 232 - 1,245 pg/mL Final         Failed - CBC within normal limits and completed in the last 12 months    WBC  Date Value Ref Range Status  02/05/2022 11.1 (H) 3.4 - 10.8 x10E3/uL Final  10/02/2021 8.6  4.0 - 10.5 K/uL Final   RBC  Date Value Ref Range Status  02/05/2022 4.81 3.77 - 5.28 x10E6/uL Final  10/02/2021 4.11 3.87 - 5.11 MIL/uL Final   Hemoglobin  Date Value Ref Range Status  02/05/2022 12.3 11.1 - 15.9 g/dL Final   Hematocrit  Date Value Ref Range Status  02/05/2022 37.9 34.0 - 46.6 % Final   MCHC  Date Value Ref Range Status  02/05/2022 32.5 31.5 - 35.7 g/dL Final  10/02/2021 31.7 30.0 - 36.0 g/dL Final   [facility]  Date Value Ref Range Status  02/05/2022 25.6 (L) 26.6 - 33.0 pg Final  10/02/2021 26.0 26.0 - 34.0 pg Final   MCV  Date Value Ref Range Status  02/05/2022 79 79 - 97 fL Final   No results found for: "PLTCOUNTKUC", "LABPLAT", "POCPLA" RDW  Date Value Ref Range Status  02/05/2022 13.2 11.7 - 15.4 % Final         Passed - Cr in normal range and within 360 days    Creatinine, Ser  Date Value Ref Range Status  02/05/2022 0.66 0.57 - 1.00 mg/dL Final  Creatinine, POC  Date Value Ref Range Status  10/05/2016 n/a mg/dL Final         Passed - HBA1C is between 0 and 7.9 and within 180 days    Hemoglobin A1C  Date Value Ref Range Status  02/05/2022 7.8 (A) 4.0 - 5.6 % Final   Hgb A1c MFr Bld  Date Value Ref Range Status  06/16/2021 8.2 (H) 4.8 - 5.6 % Final    Comment:             Prediabetes: 5.7 - 6.4          Diabetes: >6.4          Glycemic control for adults with diabetes: <7.0          Passed - eGFR in normal range and within 360 days    GFR calc Af Amer  Date Value Ref Range Status  05/11/2019 96 >59 mL/min/1.73 Final   GFR, Estimated  Date Value Ref Range Status  10/02/2021 >60 >60 mL/min Final    Comment:    (NOTE) Calculated using the CKD-EPI Creatinine Equation (2021)    eGFR  Date Value Ref Range Status  02/05/2022 88 >59 mL/min/1.73 Final         Passed - Valid encounter within last 6 months    Recent Outpatient Visits           5 days ago Type 2 diabetes mellitus with hyperglycemia, without long-term current use  of insulin [facility])   [facility] Birdie Sons, MD   4 months ago [place], [place], PA-C   6 months ago Hemorrhoids, unspecified hemorrhoid type   [facility] Thedore Mins, [place], PA-C   7 months ago Annual physical exam   [facility] Birdie Sons, MD   1 year ago Type 2 diabetes mellitus with hyperglycemia, without long-term current use of insulin [facility])   [facility] Birdie Sons, MD       Future Appointments             In 3 months Fisher, Kirstie Peri, MD [facility], [place]

## 2022-02-18 ENCOUNTER — Other Ambulatory Visit: Payer: Self-pay | Admitting: Family Medicine

## 2022-02-18 DIAGNOSIS — E1165 Type 2 diabetes mellitus with hyperglycemia: Secondary | ICD-10-CM

## 2022-02-18 MED ORDER — ONETOUCH ULTRA VI STRP: ORAL_STRIP | 4 refills | Status: DC

## 2022-02-18 MED ORDER — ONETOUCH ULTRA 2 W/DEVICE KIT: PACK | 0 refills | Status: AC

## 2022-02-24 ENCOUNTER — Encounter: Payer: Self-pay | Admitting: Obstetrics and Gynecology

## 2022-02-26 ENCOUNTER — Other Ambulatory Visit: Payer: Self-pay | Admitting: Family Medicine

## 2022-02-26 DIAGNOSIS — E039 Hypothyroidism, unspecified: Secondary | ICD-10-CM

## 2022-02-26 DIAGNOSIS — E119 Type 2 diabetes mellitus without complications: Secondary | ICD-10-CM

## 2022-03-01 DIAGNOSIS — H353221 Exudative age-related macular degeneration, left eye, with active choroidal neovascularization: Secondary | ICD-10-CM | POA: Diagnosis not present

## 2022-03-01 DIAGNOSIS — H353211 Exudative age-related macular degeneration, right eye, with active choroidal neovascularization: Secondary | ICD-10-CM | POA: Diagnosis not present

## 2022-03-01 DIAGNOSIS — H353231 Exudative age-related macular degeneration, bilateral, with active choroidal neovascularization: Secondary | ICD-10-CM | POA: Diagnosis not present

## 2022-03-02 ENCOUNTER — Ambulatory Visit: Payer: PPO | Admitting: Obstetrics and Gynecology

## 2022-03-02 DIAGNOSIS — Z4689 Encounter for fitting and adjustment of other specified devices: Secondary | ICD-10-CM

## 2022-03-05 ENCOUNTER — Other Ambulatory Visit: Payer: PPO

## 2022-03-10 ENCOUNTER — Encounter: Payer: Self-pay | Admitting: Obstetrics and Gynecology

## 2022-03-10 ENCOUNTER — Ambulatory Visit (INDEPENDENT_AMBULATORY_CARE_PROVIDER_SITE_OTHER): Payer: PPO | Admitting: Obstetrics and Gynecology

## 2022-03-10 VITALS — BP 95/67 | HR 77 | Ht 63.0 in | Wt 144.3 lb

## 2022-03-10 DIAGNOSIS — Z4689 Encounter for fitting and adjustment of other specified devices: Secondary | ICD-10-CM | POA: Diagnosis not present

## 2022-03-10 NOTE — Progress Notes (Signed)
HPI:      [name] is a 82 y.o. [phone] who LMP was No LMP recorded. Patient is postmenopausal.  Subjective:   She presents today for pessary care.  She reports no bleeding or other issues with her pessary.    Hx: The following portions of the patient's history were reviewed and updated as appropriate:             She  has a past medical history of Alopecia, Anginal pain ([place]), Benign neoplasm of large bowel, CAD (coronary artery disease), Carpal tunnel syndrome, Complication of anesthesia, DM (diabetes mellitus) ([place]), Erosion of cervix, GERD (gastroesophageal reflux disease), H/O adenomatous polyp of colon, History of chicken pox, Hypercholesteremia, Hypertension, Hypothyroid, Irritable bowel, Malignant melanoma of foot ([place]), Osteoporosis, PONV (postoperative nausea and vomiting), Post-menopausal bleeding, Procidentia of uterus, Rectal bleeding, Sepsis ([place]) (02/18/2018), and TIA (transient ischemic attack). She does not have any pertinent problems on file. She  has a past surgical history that includes Tonsillectomy; Knee surgery; Cardiac catheterization (10/2010); Colonoscopy (03/2008); Breast excisional biopsy (Right); Cataract extraction w/PHACO (Right, 07/28/2016); Cataract extraction w/PHACO (Left, 10/27/2016); Esophagogastroduodenoscopy (egd) with propofol (N/A, 05/12/2018); and Colonoscopy with propofol (N/A, 05/12/2018). Her family history includes Diabetes in her father; Heart disease in her paternal grandmother and sister; Lung cancer in her brother; Rheumatic fever in her sister. She  reports that she quit smoking about 34 years ago. Her smoking use included cigarettes. She has never used smokeless tobacco. She reports that she does not drink alcohol and does not use drugs. She has a current medication list which includes the following prescription(s): amlodipine, aspirin ec, one touch ultra 2, calcium carbonate, cholecalciferol, clopidogrel, glipizide, onetouch ultra,  levothyroxine, lisinopril-hydrochlorothiazide, metformin, metoprolol tartrate, simvastatin, and cyanocobalamin. She has No Known Allergies.       Review of Systems:  Review of Systems  Constitutional: Denied constitutional symptoms, night sweats, recent illness, fatigue, fever, insomnia and weight loss.  Eyes: Denied eye symptoms, eye pain, photophobia, vision change and visual disturbance.  Ears/Nose/Throat/Neck: Denied ear, nose, throat or neck symptoms, hearing loss, nasal discharge, sinus congestion and sore throat.  Cardiovascular: Denied cardiovascular symptoms, arrhythmia, chest pain/pressure, edema, exercise intolerance, orthopnea and palpitations.  Respiratory: Denied pulmonary symptoms, asthma, pleuritic pain, productive sputum, cough, dyspnea and wheezing.  Gastrointestinal: Denied, gastro-esophageal reflux, melena, nausea and vomiting.  Genitourinary: Denied genitourinary symptoms including symptomatic vaginal discharge, pelvic relaxation issues, and urinary complaints.  Musculoskeletal: Denied musculoskeletal symptoms, stiffness, swelling, muscle weakness and myalgia.  Dermatologic: Denied dermatology symptoms, rash and scar.  Neurologic: Denied neurology symptoms, dizziness, headache, neck pain and syncope.  Psychiatric: Denied psychiatric symptoms, anxiety and depression.  Endocrine: Denied endocrine symptoms including hot flashes and night sweats.   Meds:   Current Outpatient Medications on File Prior to Visit  Medication Sig Dispense Refill   amLODipine (NORVASC) 5 MG tablet Take 5 mg by mouth daily.     aspirin EC 81 MG tablet Take 81 mg by mouth daily.     Blood Glucose Monitoring Suppl (ONE TOUCH ULTRA 2) w/Device KIT Use to check blood sugar daily for type 2 diabetes E11.9 1 kit 0   calcium carbonate (OS-CAL) 1250 (500 Ca) MG chewable tablet Chew 1 tablet by mouth daily.     cholecalciferol (VITAMIN D) 25 MCG (1000 UNIT) tablet Take 5,000 Units by mouth daily.      clopidogrel (PLAVIX) 75 MG tablet TAKE 1 TABLET BY MOUTH EVERY DAY 90 tablet 0   glipiZIDE (GLUCOTROL XL) 5 MG  24 hr tablet Take 1 tablet (5 mg total) by mouth daily with breakfast. 90 tablet 3   glucose blood (ONETOUCH ULTRA) test strip USE TO CHECK BLOOD SUGAR DAILY. E 11.9 (TYPE 2 DIABETES MELLITIS) 100 strip 4   levothyroxine (SYNTHROID) 125 MCG tablet TAKE 1 TABLET BY MOUTH EVERY DAY 90 tablet 1   lisinopril-hydrochlorothiazide (ZESTORETIC) 20-12.5 MG tablet TAKE 1 TABLET BY MOUTH EVERY DAY 90 tablet 4   metFORMIN (GLUCOPHAGE) 1000 MG tablet TAKE 1 TABLET (1,000 MG TOTAL) BY MOUTH 2 (TWO) TIMES DAILY. TAKE WITH FOOD 180 tablet 1   metoprolol tartrate (LOPRESSOR) 50 MG tablet Take 1 tablet (50 mg total) by mouth 2 (two) times daily. 60 tablet 12   simvastatin (ZOCOR) 40 MG tablet TAKE 1 TABLET BY MOUTH EVERY DAY FOR CHOLESTEROL 90 tablet 4   vitamin B-12 (CYANOCOBALAMIN) 1000 MCG tablet Take 1,000 mcg by mouth daily.      No current facility-administered medications on file prior to visit.      Objective:     Vitals:   03/10/22 1503  BP: 95/67  Pulse: 77   Filed Weights   03/10/22 1503  Weight: 144 lb 4.8 oz (65.5 kg)              Pessary Care Pessary removed and cleaned.  Vagina checked by speculum exam- without erosions - pessary replaced.           Assessment:    [id number] Patient Active Problem List   Diagnosis Date Noted   Hemorrhoids 07/21/2021   Cystocele with prolapse 02/04/2020   PAC (premature atrial contraction) 05/11/2019   Short PR-normal QRS complex syndrome 05/11/2019   Right bundle branch block 05/11/2019   Aortic atherosclerosis ([place]) 01/05/2019   Thrombocytosis 04/14/2018   Borderline blood pressure 04/14/2018   Hiatal hernia 03/24/2018   Cholelithiases 03/24/2018   Iron deficiency anemia 03/04/2018   Cataract cortical, senile, bilateral 05/11/2017   Primary osteoarthritis of right knee 11/02/2015   Vitamin D deficiency 08/14/2014   Rectal/anal  hemorrhage 06/14/2014   Hypothyroidism 06/14/2014   Hypertension 06/14/2014   Hypercholesteremia 06/14/2014   TIA (transient ischemic attack) 06/14/2014   Gastroesophageal reflux disease 06/14/2014   Alopecia 06/14/2014   Malignant melanoma of foot ([place]) 06/14/2014   Irritable bowel 06/14/2014   Coronary artery disease 06/14/2014   Type 2 diabetes mellitus with hyperglycemia, without long-term current use of insulin ([place]) 06/14/2014   Carpal tunnel syndrome 06/14/2014   Procidentia of uterus 06/14/2014   Osteoporosis 06/14/2014   History of adenomatous polyp of colon 06/14/2014   B12 deficiency 08/08/2008   Colon, diverticulosis 05/14/2008     1. Encounter for pessary maintenance     Patient doing well with pessary.   Plan:            1.  Pessary removed cleaned and reinserted. Orders No orders of the defined types were placed in this encounter.   No orders of the defined types were placed in this encounter.     F/U  Return in about 6 months (around 09/08/2022). I spent 21 minutes involved in the care of this patient preparing to see the patient by obtaining and reviewing her medical history (including labs, imaging tests and prior procedures), documenting clinical information in the electronic health record (EHR), counseling and coordinating care plans, writing and sending prescriptions, ordering tests or procedures and in direct communicating with the patient and medical staff discussing pertinent items from her history and physical exam.  Grayling Congress.  Amalia Hailey, M.D. 03/10/2022 3:50 PM

## 2022-03-10 NOTE — Progress Notes (Signed)
Patient presents for pessary maintenance. She states no issues over the past 6 months. Patient states no other questions or concerns at this time.

## 2022-03-19 ENCOUNTER — Ambulatory Visit: Payer: Self-pay | Admitting: *Deleted

## 2022-03-19 NOTE — Telephone Encounter (Signed)
Message from Kirkland Callas sent at 03/19/2022 10:57 AM EST  Summary: medication questions   Pt called asking questions regarding her glucose and the medications she is taking now and wanting to know if she needs to go down on the dose.  She said it is 122 after eating and she is thinking maybe she does not need to take her medication today.  CB#  [phone]          Call History   Type Contact Phone/Fax User  03/19/2022 10:55 AM EST Phone (Incoming) Pittman, April Mcmenamin (Self) [phone] Lemmie Evens) Greggory Keen D   Reason for Disposition  Caller has medicine question only, adult not sick, AND triager answers question  Answer Assessment - Initial Assessment Questions 1. NAME of MEDICINE: "What medicine(s) are you calling about?"     My sugar has been high.   I was increased to 5 mg of the glipizide last time I saw Dr. Caryn Section.   This morning it was 122 2 hours after I ate a sugary pop sickle.  I took my metformin 1000 mg but did not take the glipizide this morning.  2. QUESTION: "What is your question?" (e.g., double dose of medicine, side effect)     Should I take the old dose 2.5 mg of the glipizide?   I let her know not to change the dose without talking with Dr. Caryn Section first. 3. PRESCRIBER: "Who prescribed the medicine?" Reason: if prescribed by specialist, call should be referred to that group.     Dr. Caryn Section 4. SYMPTOMS: "Do you have any symptoms?" If Yes, ask: "What symptoms are you having?"  "How bad are the symptoms (e.g., mild, moderate, severe)     My sugar has been running high, I thought.    But it hasn't.    5. PREGNANCY:  "Is there any chance that you are pregnant?" "When was your last menstrual period?"     N/A due to age  Protocols used: Medication Question Call-A-AH

## 2022-03-19 NOTE — Telephone Encounter (Signed)
  Chief Complaint: Wanted to know if she should stop taking the glipizide 5 mg and go back to the 2.5 mg glipizide she was on because her glucose this morning was 122 2 hours after eating.   She was thinking this was too low.   I did some education with her and instructed her not to change her diabetes medication until she talks with Dr. Caryn Section first.   To continue taking the glipizide 5 mg that is prescribed.   Symptoms: No symptoms when asked.   I went over the symptoms of hypoglycemia to watch for. Frequency: Just this morning. Pertinent Negatives: Patient denies feeling the symptoms of hypoglycemia. Disposition: '[]'$ ED /'[]'$ Urgent Care (no appt availability in office) / '[]'$ Appointment(In office/virtual)/ '[]'$  [facility]/ '[x]'$ Home Care/ '[]'$ Refused Recommended Disposition /'[]'$ [facility]/ '[]'$  Follow-up with PCP Additional Notes: Pt verbalized understanding of continuing to take the glipizide 5 mg and not to change the dose of her medication without talking with Dr. Caryn Section first.

## 2022-03-20 ENCOUNTER — Other Ambulatory Visit: Payer: Self-pay | Admitting: Family Medicine

## 2022-03-20 DIAGNOSIS — E1165 Type 2 diabetes mellitus with hyperglycemia: Secondary | ICD-10-CM

## 2022-03-22 NOTE — Telephone Encounter (Signed)
Rx 02/18/22 #100 4RF- should have RF on file Requested Prescriptions  Pending Prescriptions Disp Refills   ONETOUCH ULTRA test strip [Pharmacy Med Name: ONE TOUCH ULTRA BLUE TEST STRP] 100 strip 4    Sig: USE TO CHECK BLOOD SUGAR DAILY. E 11.9 (TYPE 2 DIABETES MELLITIS)     Endocrinology: Diabetes - Testing Supplies Passed - 03/20/2022 12:59 AM      Passed - Valid encounter within last 12 months    Recent Outpatient Visits           1 month ago Type 2 diabetes mellitus with hyperglycemia, without long-term current use of insulin ([place])   [place] Birdie Sons, MD   6 months ago [place], [place], PA-C   8 months ago Hemorrhoids, unspecified hemorrhoid type   [place] [facility] [name], PA-C   9 months ago Annual physical exam   [facility] Birdie Sons, MD   1 year ago Type 2 diabetes mellitus with hyperglycemia, without long-term current use of insulin [facility])   [place], Donald E, MD       Future Appointments             In 2 months Fisher, Kirstie Peri, MD [facility], [facility]

## 2022-03-24 ENCOUNTER — Other Ambulatory Visit: Payer: Self-pay | Admitting: Family Medicine

## 2022-03-24 MED ORDER — LISINOPRIL-HYDROCHLOROTHIAZIDE 20-25 MG PO TABS: 1.0000 | ORAL_TABLET | Freq: Every day | ORAL | 3 refills | Status: DC

## 2022-04-13 ENCOUNTER — Telehealth: Payer: Self-pay | Admitting: Family Medicine

## 2022-04-13 NOTE — Telephone Encounter (Signed)
Contacted April Pittman to schedule their annual wellness visit. Appointment made for 05/12/2022.  [place] Direct Dial: [phone]

## 2022-04-19 ENCOUNTER — Other Ambulatory Visit: Payer: Self-pay

## 2022-04-19 DIAGNOSIS — D509 Iron deficiency anemia, unspecified: Secondary | ICD-10-CM | POA: Diagnosis not present

## 2022-04-19 DIAGNOSIS — E039 Hypothyroidism, unspecified: Secondary | ICD-10-CM | POA: Diagnosis not present

## 2022-04-19 DIAGNOSIS — E538 Deficiency of other specified B group vitamins: Secondary | ICD-10-CM | POA: Diagnosis not present

## 2022-04-20 LAB — TSH: TSH: 20.3 u[IU]/mL — ABNORMAL HIGH (ref 0.450–4.500)

## 2022-04-20 LAB — IRON,TIBC AND FERRITIN PANEL
Ferritin: 18 ng/mL (ref 15–150)
Iron Saturation: 18 % (ref 15–55)
Iron: 70 ug/dL (ref 27–139)
Total Iron Binding Capacity: 388 ug/dL (ref 250–450)
UIBC: 318 ug/dL (ref 118–369)

## 2022-04-20 LAB — B12 AND FOLATE PANEL
Folate: 10.8 ng/mL (ref >3.0)
Vitamin B-12: 459 pg/mL (ref 232–1245)

## 2022-05-10 DIAGNOSIS — H353211 Exudative age-related macular degeneration, right eye, with active choroidal neovascularization: Secondary | ICD-10-CM | POA: Diagnosis not present

## 2022-05-10 DIAGNOSIS — H353231 Exudative age-related macular degeneration, bilateral, with active choroidal neovascularization: Secondary | ICD-10-CM | POA: Diagnosis not present

## 2022-05-10 DIAGNOSIS — H353221 Exudative age-related macular degeneration, left eye, with active choroidal neovascularization: Secondary | ICD-10-CM | POA: Diagnosis not present

## 2022-05-12 ENCOUNTER — Ambulatory Visit (INDEPENDENT_AMBULATORY_CARE_PROVIDER_SITE_OTHER): Payer: PPO

## 2022-05-12 VITALS — Ht 63.0 in | Wt 144.0 lb

## 2022-05-12 DIAGNOSIS — Z0000 Encounter for general adult medical examination without abnormal findings: Secondary | ICD-10-CM | POA: Diagnosis not present

## 2022-05-12 NOTE — Progress Notes (Signed)
I connected with  April Pittman on 05/12/22 by a audio enabled telemedicine application and verified that I am speaking with the correct person using two identifiers.  Patient Location: Home  Provider Location: Office/Clinic  I discussed the limitations of evaluation and management by telemedicine. The patient expressed understanding and agreed to proceed.  Subjective:   April Pittman is a 82 y.o. female who presents for Medicare Annual (Subsequent) preventive examination.  Review of Systems    Cardiac Risk Factors include: advanced age (>48men, >34 women);hypertension;sedentary lifestyle;diabetes mellitus    Objective:    Today's Vitals   05/12/22 1355  Weight: 144 lb (65.3 kg)  Height: 5\' 3"  (1.6 m)   Body mass index is 25.51 kg/m.     05/12/2022    2:15 PM 10/02/2021   10:39 AM 04/29/2021    2:32 PM 06/07/2020    4:10 PM 04/23/2020    2:36 PM 06/08/2018   10:46 PM 04/14/2018   12:59 PM  Advanced Directives  Does Patient Have a Medical Advance Directive? Yes No No No No Yes No  Type of Paramedic of [place];Living will        Would patient like information on creating a medical advance directive?  No - Patient declined No - Patient declined  No - Patient declined      Current Medications (verified) Outpatient Encounter Medications as of 05/12/2022  Medication Sig   amLODipine (NORVASC) 5 MG tablet Take 5 mg by mouth daily.   aspirin EC 81 MG tablet Take 81 mg by mouth daily.   Blood Glucose Monitoring Suppl (ONE TOUCH ULTRA 2) w/Device KIT Use to check blood sugar daily for type 2 diabetes E11.9   calcium carbonate (OS-CAL) 1250 (500 Ca) MG chewable tablet Chew 1 tablet by mouth daily.   cholecalciferol (VITAMIN D) 25 MCG (1000 UNIT) tablet Take 5,000 Units by mouth daily.   clopidogrel (PLAVIX) 75 MG tablet TAKE 1 TABLET BY MOUTH EVERY DAY   glipiZIDE (GLUCOTROL XL) 5 MG 24 hr tablet Take 1 tablet (5 mg total) by mouth daily with breakfast.    glucose blood (ONETOUCH ULTRA) test strip USE TO CHECK BLOOD SUGAR DAILY. E 11.9 (TYPE 2 DIABETES MELLITIS)   levothyroxine (SYNTHROID) 125 MCG tablet TAKE 1 TABLET BY MOUTH EVERY DAY   lisinopril-hydrochlorothiazide (ZESTORETIC) 20-25 MG tablet Take 1 tablet by mouth daily.   metFORMIN (GLUCOPHAGE) 1000 MG tablet TAKE 1 TABLET (1,000 MG TOTAL) BY MOUTH 2 (TWO) TIMES DAILY. TAKE WITH FOOD   metoprolol tartrate (LOPRESSOR) 50 MG tablet Take 1 tablet (50 mg total) by mouth 2 (two) times daily.   simvastatin (ZOCOR) 40 MG tablet TAKE 1 TABLET BY MOUTH EVERY DAY FOR CHOLESTEROL   vitamin B-12 (CYANOCOBALAMIN) 1000 MCG tablet Take 1,000 mcg by mouth daily.    No facility-administered encounter medications on file as of 05/12/2022.    Allergies (verified) Patient has no known allergies.   History: Past Medical History:  Diagnosis Date   Alopecia    Anginal pain ([place])    Benign neoplasm of large bowel    CAD (coronary artery disease)    Carpal tunnel syndrome    Complication of anesthesia    vomits with any pain medicine   DM (diabetes mellitus) ([place])    Erosion of cervix    GERD (gastroesophageal reflux disease)    H/O adenomatous polyp of colon    with sever dysplasia, removed 2000 by colonoscopy   History of chicken pox  Hypercholesteremia    Hypertension    Hypothyroid    Irritable bowel    Malignant melanoma of foot ([place])    Osteoporosis    PONV (postoperative nausea and vomiting)    Post-menopausal bleeding    Procidentia of uterus    with traction cystocele, managed with gellhorn pessary   Rectal bleeding    Sepsis ([place]) 02/18/2018   TIA (transient ischemic attack)    Past Surgical History:  Procedure Laterality Date   BREAST EXCISIONAL BIOPSY Right    Benign   CARDIAC CATHETERIZATION  10/2010   +stenting to LAD and RCA; symptoms: chest pain, elevated Troponin, +AMI. [place] W/PHACO Right 07/28/2016   Procedure: CATARACT EXTRACTION PHACO  AND INTRAOCULAR LENS PLACEMENT ([place])  Right Diabetic;  Surgeon: Leandrew Koyanagi, MD;  Location: [facility];  Service: Ophthalmology;  Laterality: Right;  Diabetic leave patient at 9 arrival   CATARACT EXTRACTION W/PHACO Left 10/27/2016   Procedure: CATARACT EXTRACTION PHACO AND INTRAOCULAR LENS PLACEMENT ([place]) LEFT DIABETIC;  Surgeon: Leandrew Koyanagi, MD;  Location: [facility];  Service: Ophthalmology;  Laterality: Left;   COLONOSCOPY  03/2008   2 polyps, internal and external hemorrhoids, +anal growth. Dr. Vira Agar   COLONOSCOPY WITH PROPOFOL N/A 05/12/2018   Procedure: COLONOSCOPY WITH PROPOFOL;  Surgeon: Manya Silvas, MD;  Location[facility] ENDOSCOPY;  Service: Endoscopy;  Laterality: N/A;   ESOPHAGOGASTRODUODENOSCOPY (EGD) WITH PROPOFOL N/A 05/12/2018   Procedure: ESOPHAGOGASTRODUODENOSCOPY (EGD) WITH PROPOFOL;  Surgeon: Manya Silvas, MD;  Location[facility] ENDOSCOPY;  Service: Endoscopy;  Laterality: N/A;   KNEE SURGERY     TONSILLECTOMY     as a child   Family History  Problem Relation Age of Onset   Lung cancer Brother    Diabetes Father    Heart disease Sister        Valvular rheumatic heart disease   Rheumatic fever Sister    Heart disease Paternal Grandmother    Breast cancer Neg Hx    Ovarian cancer Neg Hx    Colon cancer Neg Hx    Social History   Socioeconomic History   Marital status: Married    Spouse name: Not on file   Number of children: 3   Years of education: Not on file   Highest education level: GED or equivalent  Occupational History   Occupation: Retired    Comment: former Engineer, manufacturing systems  Tobacco Use   Smoking status: Former    Types: Cigarettes    Quit date: 03/03/1988    Years since quitting: 34.2   Smokeless tobacco: Never  Vaping Use   Vaping Use: Never used  Substance and Sexual Activity   Alcohol use: No    Alcohol/week: 0.0 standard drinks of alcohol   Drug use: No   Sexual activity: Not Currently    Birth  control/protection: Post-menopausal  Other Topics Concern   Not on file  Social History Narrative   Not on file   Social Determinants of Health   Financial Resource Strain: Low Risk  (05/12/2022)   Overall Financial Resource Strain (CARDIA)    Difficulty of Paying Living Expenses: Not hard at all  Food Insecurity: No Food Insecurity (05/12/2022)   Hunger Vital Sign    Worried About Running Out of Food in the Last Year: Never true    [place] in the Last Year: Never true  Transportation Needs: No Transportation Needs (05/12/2022)   PRAPARE - Transportation    Lack of  Transportation (Medical): No    Lack of Transportation (Non-Medical): No  Physical Activity: Inactive (05/12/2022)   Exercise Vital Sign    Days of Exercise per Week: 0 days    Minutes of Exercise per Session: 0 min  Stress: No Stress Concern Present (05/12/2022)   [place]    Feeling of Stress : Not at all  Social Connections: Socially Isolated (05/12/2022)   Social Connection and Isolation Panel [NHANES]    Frequency of Communication with Friends and Family: More than three times a week    Frequency of Social Gatherings with Friends and Family: Twice a week    Attends Religious Services: Never    Marine scientist or Organizations: No    Attends Archivist Meetings: Never    Marital Status: Widowed    Tobacco Counseling Counseling given: Not Answered   Clinical Intake:  Pre-visit preparation completed: Yes  Pain : No/denies pain     BMI - recorded: 25.51 Nutritional Status: BMI 25 -29 Overweight Nutritional Risks: None Diabetes: Yes CBG done?: No Did pt. bring in CBG monitor from home?: No  How often do you need to have someone help you when you read instructions, pamphlets, or other written materials from your doctor or pharmacy?: 1 - Never  Diabetic?yes  Interpreter Needed?: No  Comments: lives  alone Information entered by :: B.Marcee Jacobs,LPN   Activities of Daily Living    05/12/2022    2:16 PM  In your present state of health, do you have any difficulty performing the following activities:  Hearing? 1  Vision? 1  Difficulty concentrating or making decisions? 1  Walking or climbing stairs? 1  Dressing or bathing? 0  Doing errands, shopping? 1  Comment pt does not drive anymore  Preparing Food and eating ? N  Using the Toilet? N  In the past six months, have you accidently leaked urine? Y  Do you have problems with loss of bowel control? N  Managing your Medications? N  Managing your Finances? N  Housekeeping or managing your Housekeeping? Y    Patient Care Team: Birdie Sons, MD as PCP - General (Family Medicine) Ubaldo Glassing Javier Docker, MD as Consulting Physician (Cardiology) Gae Dry, MD as Referring Physician (Obstetrics and Gynecology) Isaias Sakai, MD as Referring Physician (Ophthalmology) Leandrew Koyanagi, MD as Referring Physician (Ophthalmology)  Indicate any recent Medical Services you may have received from other than [facility] providers in the past year (date may be approximate).     Assessment:   This is a routine wellness examination for [place].  Hearing/Vision screen Hearing Screening - Comments:: Adequate hearing Vision Screening - Comments:: Pt cannot see well.Marland Kitchengetting eye injections in both eyes [place] [facility]  Dietary issues and exercise activities discussed: Exercise limited by: cardiac condition(s);orthopedic condition(s)   Goals Addressed             This Visit's Progress    DIET - EAT MORE FRUITS AND VEGETABLES   Not on track    Increase water intake   Not on track    Recommend increasing water intake to 6-8 glasses of water a day.        Depression Screen    05/12/2022    2:13 PM 04/29/2021    2:31 PM 04/23/2020    2:26 PM 01/15/2020   11:17 AM 11/23/2018   10:26 AM 11/18/2017    9:43 AM 11/16/2016   10:48 AM  PHQ 2/9 Scores  PHQ - 2 Score 1 1 1  0 0 2 2  PHQ- 9 Score      8 10    Fall Risk    05/12/2022    2:08 PM 04/29/2021    2:34 PM 04/23/2020    2:36 PM 09/18/2019    1:53 PM 11/23/2018   10:26 AM  Fall Risk   Falls in the past year? 0 1 0 1 1  Comment    Emmi Telephone Survey: data to providers prior to load   Number falls in past yr: 0 0 0 1 1  Comment    Emmi Telephone Survey Actual Response = 4   Injury with Fall? 0 0 0 1 1  Risk for fall due to : History of fall(s);Impaired balance/gait;Impaired vision History of fall(s)   Impaired mobility;Impaired vision  Follow up Falls prevention discussed;Education provided Falls prevention discussed   Falls prevention discussed    FALL RISK PREVENTION PERTAINING TO THE HOME:  Any stairs in or around the home? Yes  If so, are there any without handrails? Yes  Home free of loose throw rugs in walkways, pet beds, electrical cords, etc? Yes  Adequate lighting in your home to reduce risk of falls? Yes   ASSISTIVE DEVICES UTILIZED TO PREVENT FALLS:  Life alert? No  Use of a cane, walker or w/c? Yes walker and cane;uses sometimes Grab bars in the bathroom? Yes  Shower chair or bench in shower? Yes  Elevated toilet seat or a handicapped toilet? Yes   Cognitive Function:        05/12/2022    2:19 PM 11/18/2017   10:03 AM  6CIT Screen  What Year? 0 points 0 points  What month? 0 points 0 points  What time? 0 points 0 points  Count back from 20 0 points 0 points  Months in reverse 0 points 2 points  Repeat phrase 0 points 0 points  Total Score 0 points 2 points    Immunizations Immunization History  Administered Date(s) Administered   Fluad Quad(high Dose 65+) 11/27/2018, 01/15/2020, 02/10/2021, 02/05/2022   Influenza, High Dose Seasonal PF 01/29/2015, 12/31/2015, 01/11/2017, 12/15/2017   Moderna Sars-Covid-2 Vaccination 02/10/2020   PFIZER(Purple Top)SARS-COV-2 Vaccination 03/08/2019, 03/29/2019   Pfizer Covid-19 Vaccine Bivalent Booster 95yrs & up  03/06/2021   Pneumococcal Conjugate-13 11/07/2013   Pneumococcal Polysaccharide-23 12/07/2010   Zoster, Live 09/15/2015    TDAP status: Due, Education has been provided regarding the importance of this vaccine. Advised may receive this vaccine at local pharmacy or Health Dept. Aware to provide a copy of the vaccination record if obtained from local pharmacy or Health Dept. Verbalized acceptance and understanding.  Flu Vaccine status: Up to date  Pneumococcal vaccine status: Up to date  Covid-19 vaccine status: Completed vaccines  Qualifies for Shingles Vaccine? Yes   Zostavax completed No   Shingrix Completed?: No.    Education has been provided regarding the importance of this vaccine. Patient has been advised to call insurance company to determine out of pocket expense if they have not yet received this vaccine. Advised may also receive vaccine at local pharmacy or Health Dept. Verbalized acceptance and understanding.  Screening Tests Health Maintenance  Topic Date Due   DTaP/Tdap/Td (1 - Tdap) Never done   Zoster Vaccines- Shingrix (1 of 2) Never done   DEXA SCAN  12/13/2020   COVID-19 Vaccine (5 - 2023-24 season) 10/23/2021   OPHTHALMOLOGY EXAM  01/09/2022   Diabetic kidney evaluation - Urine ACR  06/17/2022  HEMOGLOBIN A1C  08/07/2022   Diabetic kidney evaluation - eGFR measurement  02/06/2023   COLONOSCOPY (Pts 45-54yrs Insurance coverage will need to be confirmed)  05/12/2023   Medicare Annual Wellness (AWV)  05/12/2023   Pneumonia Vaccine 72+ Years old  Completed   INFLUENZA VACCINE  Completed   HPV VACCINES  Aged Out    Health Maintenance  Health Maintenance Due  Topic Date Due   DTaP/Tdap/Td (1 - Tdap) Never done   Zoster Vaccines- Shingrix (1 of 2) Never done   DEXA SCAN  12/13/2020   COVID-19 Vaccine (5 - 2023-24 season) 10/23/2021   OPHTHALMOLOGY EXAM  01/09/2022    Colorectal cancer screening: No longer required.   Mammogram status: No longer required  due to age.  Bone Density status: Completed 5. Results reflect: Bone density results: OSTEOPOROSIS. Repeat every 5 years.  Lung Cancer Screening: (Low Dose CT Chest recommended if Age 20-80 years, 30 pack-year currently smoking OR have quit w/in 15years.) does not qualify.   Lung Cancer Screening Referral: no  Additional Screening:  Hepatitis C Screening: does not qualify; Completed yes  Vision Screening: Recommended annual ophthalmology exams for early detection of glaucoma and other disorders of the eye. Is the patient up to date with their annual eye exam?  Yes  Who is the provider or what is the name of the office in which the patient attends annual eye exams? [facility] If pt is not established with a provider, would they like to be referred to a provider to establish care? No .   Dental Screening: Recommended annual dental exams for proper oral hygiene  Community Resource Referral / Chronic Care Management: CRR required this visit?  No   CCM required this visit?  No      Plan:     I have personally reviewed and noted the following in the patient's chart:   Medical and social history Use of alcohol, tobacco or illicit drugs  Current medications and supplements including opioid prescriptions. Patient is not currently taking opioid prescriptions. Functional ability and status Nutritional status Physical activity Advanced directives List of other physicians Hospitalizations, surgeries, and ER visits in previous 12 months Vitals Screenings to include cognitive, depression, and falls Referrals and appointments  In addition, I have reviewed and discussed with patient certain preventive protocols, quality metrics, and best practice recommendations. A written personalized care plan for preventive services as well as general preventive health recommendations were provided to patient.     Roger Shelter, LPN   [id number]   Nurse Notes: Pt  very pleasant and chatty  states she is doing well;continues to get eye injections every five weeks. She relays she cannot see very well as cataract surgery not helped; is receiving meals on wheels right now. Pt has no concerns or questions at this time

## 2022-05-12 NOTE — Patient Instructions (Signed)
Ms. Barsanti , Thank you for taking time to come for your Medicare Wellness Visit. I appreciate your ongoing commitment to your health goals. Please review the following plan we discussed and let me know if I can assist you in the future.   These are the goals we discussed:  Goals      DIET - EAT MORE FRUITS AND VEGETABLES     Increase water intake     Recommend increasing water intake to 6-8 glasses of water a day.         This is a list of the screening recommended for you and due dates:  Health Maintenance  Topic Date Due   DTaP/Tdap/Td vaccine (1 - Tdap) Never done   Zoster (Shingles) Vaccine (1 of 2) Never done   DEXA scan (bone density measurement)  12/13/2020   COVID-19 Vaccine (5 - 2023-24 season) 10/23/2021   Eye exam for diabetics  01/09/2022   Yearly kidney health urinalysis for diabetes  06/17/2022   Hemoglobin A1C  08/07/2022   Yearly kidney function blood test for diabetes  02/06/2023   Colon Cancer Screening  05/12/2023   Medicare Annual Wellness Visit  05/12/2023   Pneumonia Vaccine  Completed   Flu Shot  Completed   HPV Vaccine  Aged Out    Advanced directives: yes  Conditions/risks identified: moderate falls risk  Next appointment: Follow up in one year for your annual wellness visit 05/17/2023 @1 :30pm   Preventive Care 65 Years and Older, Female Preventive care refers to lifestyle choices and visits with your health care provider that can promote health and wellness. What does preventive care include? A yearly physical exam. This is also called an annual well check. Dental exams once or twice a year. Routine eye exams. Ask your health care provider how often you should have your eyes checked. Personal lifestyle choices, including: Daily care of your teeth and gums. Regular physical activity. Eating a healthy diet. Avoiding tobacco and drug use. Limiting alcohol use. Practicing safe sex. Taking low-dose aspirin every day. Taking vitamin and mineral  supplements as recommended by your health care provider. What happens during an annual well check? The services and screenings done by your health care provider during your annual well check will depend on your age, overall health, lifestyle risk factors, and family history of disease. Counseling  Your health care provider may ask you questions about your: Alcohol use. Tobacco use. Drug use. Emotional well-being. Home and relationship well-being. Sexual activity. Eating habits. History of falls. Memory and ability to understand (cognition). Work and work Statistician. Reproductive health. Screening  You may have the following tests or measurements: Height, weight, and BMI. Blood pressure. Lipid and cholesterol levels. These may be checked every 5 years, or more frequently if you are over 96 years old. Skin check. Lung cancer screening. You may have this screening every year starting at age 31 if you have a 30-pack-year history of smoking and currently smoke or have quit within the past 15 years. Fecal occult blood test (FOBT) of the stool. You may have this test every year starting at age 39. Flexible sigmoidoscopy or colonoscopy. You may have a sigmoidoscopy every 5 years or a colonoscopy every 10 years starting at age 5. Hepatitis C blood test. Hepatitis B blood test. Sexually transmitted disease (STD) testing. Diabetes screening. This is done by checking your blood sugar (glucose) after you have not eaten for a while (fasting). You may have this done every 1-3 years. Bone density scan.  This is done to screen for osteoporosis. You may have this done starting at age 20. Mammogram. This may be done every 1-2 years. Talk to your health care provider about how often you should have regular mammograms. Talk with your health care provider about your test results, treatment options, and if necessary, the need for more tests. Vaccines  Your health care provider may recommend certain  vaccines, such as: Influenza vaccine. This is recommended every year. Tetanus, diphtheria, and acellular pertussis (Tdap, Td) vaccine. You may need a Td booster every 10 years. Zoster vaccine. You may need this after age 27. Pneumococcal 13-valent conjugate (PCV13) vaccine. One dose is recommended after age 50. Pneumococcal polysaccharide (PPSV23) vaccine. One dose is recommended after age 54. Talk to your health care provider about which screenings and vaccines you need and how often you need them. This information is not intended to replace advice given to you by your health care provider. Make sure you discuss any questions you have with your health care provider. Document Released: 03/07/2015 Document Revised: 10/29/2015 Document Reviewed: 12/10/2014 Elsevier Interactive Patient Education  2017 [place] Prevention in the Home Falls can cause injuries. They can happen to people of all ages. There are many things you can do to make your home safe and to help prevent falls. What can I do on the outside of my home? Regularly fix the edges of walkways and driveways and fix any cracks. Remove anything that might make you trip as you walk through a door, such as a raised step or threshold. Trim any bushes or trees on the path to your home. Use bright outdoor lighting. Clear any walking paths of anything that might make someone trip, such as rocks or tools. Regularly check to see if handrails are loose or broken. Make sure that both sides of any steps have handrails. Any raised decks and porches should have guardrails on the edges. Have any leaves, snow, or ice cleared regularly. Use sand or salt on walking paths during winter. Clean up any spills in your garage right away. This includes oil or grease spills. What can I do in the bathroom? Use night lights. Install grab bars by the toilet and in the tub and shower. Do not use towel bars as grab bars. Use non-skid mats or decals in  the tub or shower. If you need to sit down in the shower, use a plastic, non-slip stool. Keep the floor dry. Clean up any water that spills on the floor as soon as it happens. Remove soap buildup in the tub or shower regularly. Attach bath mats securely with double-sided non-slip rug tape. Do not have throw rugs and other things on the floor that can make you trip. What can I do in the bedroom? Use night lights. Make sure that you have a light by your bed that is easy to reach. Do not use any sheets or blankets that are too big for your bed. They should not hang down onto the floor. Have a firm chair that has side arms. You can use this for support while you get dressed. Do not have throw rugs and other things on the floor that can make you trip. What can I do in the kitchen? Clean up any spills right away. Avoid walking on wet floors. Keep items that you use a lot in easy-to-reach places. If you need to reach something above you, use a strong step stool that has a grab bar. Keep electrical cords  out of the way. Do not use floor polish or wax that makes floors slippery. If you must use wax, use non-skid floor wax. Do not have throw rugs and other things on the floor that can make you trip. What can I do with my stairs? Do not leave any items on the stairs. Make sure that there are handrails on both sides of the stairs and use them. Fix handrails that are broken or loose. Make sure that handrails are as long as the stairways. Check any carpeting to make sure that it is firmly attached to the stairs. Fix any carpet that is loose or worn. Avoid having throw rugs at the top or bottom of the stairs. If you do have throw rugs, attach them to the floor with carpet tape. Make sure that you have a light switch at the top of the stairs and the bottom of the stairs. If you do not have them, ask someone to add them for you. What else can I do to help prevent falls? Wear shoes that: Do not have high  heels. Have rubber bottoms. Are comfortable and fit you well. Are closed at the toe. Do not wear sandals. If you use a stepladder: Make sure that it is fully opened. Do not climb a closed stepladder. Make sure that both sides of the stepladder are locked into place. Ask someone to hold it for you, if possible. Clearly mark and make sure that you can see: Any grab bars or handrails. First and last steps. Where the edge of each step is. Use tools that help you move around (mobility aids) if they are needed. These include: Canes. Walkers. Scooters. Crutches. Turn on the lights when you go into a dark area. Replace any light bulbs as soon as they burn out. Set up your furniture so you have a clear path. Avoid moving your furniture around. If any of your floors are uneven, fix them. If there are any pets around you, be aware of where they are. Review your medicines with your doctor. Some medicines can make you feel dizzy. This can increase your chance of falling. Ask your doctor what other things that you can do to help prevent falls. This information is not intended to replace advice given to you by your health care provider. Make sure you discuss any questions you have with your health care provider. Document Released: 12/05/2008 Document Revised: 07/17/2015 Document Reviewed: 03/15/2014 Elsevier Interactive Patient Education  2017 [employer].

## 2022-06-07 ENCOUNTER — Encounter: Payer: Self-pay | Admitting: Family Medicine

## 2022-06-07 ENCOUNTER — Ambulatory Visit (INDEPENDENT_AMBULATORY_CARE_PROVIDER_SITE_OTHER): Payer: PPO | Admitting: Family Medicine

## 2022-06-07 VITALS — BP 136/82 | HR 89 | Ht 63.0 in | Wt 148.0 lb

## 2022-06-07 DIAGNOSIS — E1165 Type 2 diabetes mellitus with hyperglycemia: Secondary | ICD-10-CM | POA: Diagnosis not present

## 2022-06-07 DIAGNOSIS — F5101 Primary insomnia: Secondary | ICD-10-CM

## 2022-06-07 DIAGNOSIS — E538 Deficiency of other specified B group vitamins: Secondary | ICD-10-CM | POA: Diagnosis not present

## 2022-06-07 DIAGNOSIS — E039 Hypothyroidism, unspecified: Secondary | ICD-10-CM

## 2022-06-07 DIAGNOSIS — M810 Age-related osteoporosis without current pathological fracture: Secondary | ICD-10-CM

## 2022-06-07 LAB — POCT GLYCOSYLATED HEMOGLOBIN (HGB A1C)
Est. average glucose Bld gHb Est-mCnc: 140
Hemoglobin A1C: 7.2 % — AB (ref 4.0–5.6)

## 2022-06-07 NOTE — Progress Notes (Signed)
Established patient visit   Patient: April Pittman   DOB: 02-07-1941   82 y.o. Female  MRN: [id number] Visit Date: 06/07/2022  Today's healthcare provider: Mila Merry, MD    Subjective    Diabetes Pertinent negatives for hypoglycemia include no dizziness. Pertinent negatives for diabetes include no chest pain, no fatigue and no weakness.   Here top follow up on diabetes, 12 deficiency, and hypothyroid.  Lab Results  Component Value Date   HGBA1C 7.2 (A) 06/07/2022   HGBA1C 7.8 (A) 02/05/2022   HGBA1C 8.2 (H) 06/16/2021  Glipizide was increased to 5mg  in December which she is tolerating well.   Levothyroxine was increased to 125 after labs checked in February.  Last thyroid functions Lab Results  Component Value Date   TSH 20.300 (H) 04/19/2022   T4TOTAL 9.8 04/28/2016   Has resumed b12 supplement since labs checked in December Lab Results  Component Value Date   VITAMINB12 459 04/19/2022   VITAMINB12 167 (L) 02/05/2022   VITAMINB12 497 05/16/2020      Medications: Outpatient Medications Prior to Visit  Medication Sig   amLODipine (NORVASC) 5 MG tablet Take 5 mg by mouth daily.   aspirin EC 81 MG tablet Take 81 mg by mouth daily.   Blood Glucose Monitoring Suppl (ONE TOUCH ULTRA 2) w/Device KIT Use to check blood sugar daily for type 2 diabetes E11.9   calcium carbonate (OS-CAL) 1250 (500 Ca) MG chewable tablet Chew 1 tablet by mouth daily.   cholecalciferol (VITAMIN D) 25 MCG (1000 UNIT) tablet Take 5,000 Units by mouth daily.   clopidogrel (PLAVIX) 75 MG tablet TAKE 1 TABLET BY MOUTH EVERY DAY   glipiZIDE (GLUCOTROL XL) 5 MG 24 hr tablet Take 1 tablet (5 mg total) by mouth daily with breakfast.   glucose blood (ONETOUCH ULTRA) test strip USE TO CHECK BLOOD SUGAR DAILY. E 11.9 (TYPE 2 DIABETES MELLITIS)   levothyroxine (SYNTHROID) 125 MCG tablet TAKE 1 TABLET BY MOUTH EVERY DAY   lisinopril-hydrochlorothiazide (ZESTORETIC) 20-25 MG tablet Take 1 tablet  by mouth daily.   metFORMIN (GLUCOPHAGE) 1000 MG tablet TAKE 1 TABLET (1,000 MG TOTAL) BY MOUTH 2 (TWO) TIMES DAILY. TAKE WITH FOOD   metoprolol tartrate (LOPRESSOR) 50 MG tablet Take 1 tablet (50 mg total) by mouth 2 (two) times daily.   simvastatin (ZOCOR) 40 MG tablet TAKE 1 TABLET BY MOUTH EVERY DAY FOR CHOLESTEROL   vitamin B-12 (CYANOCOBALAMIN) 1000 MCG tablet Take 1,000 mcg by mouth daily.    No facility-administered medications prior to visit.    Review of Systems  Constitutional:  Negative for appetite change, chills, fatigue and fever.  Respiratory:  Negative for chest tightness and shortness of breath.   Cardiovascular:  Negative for chest pain and palpitations.  Gastrointestinal:  Negative for abdominal pain, nausea and vomiting.  Neurological:  Negative for dizziness and weakness.       Objective    BP 136/82 (BP Location: Left Arm, Patient Position: Sitting, Cuff Size: Normal)   Pulse 89   Ht 5\' 3"  (1.6 m)   Wt 148 lb (67.1 kg)   SpO2 97%   BMI 26.22 kg/m    Physical Exam   General: Appearance:    Well developed, well nourished female in no acute distress  Eyes:    PERRL, conjunctiva/corneas clear, EOM's intact       Lungs:     Clear to auscultation bilaterally, respirations unlabored  Heart:    Normal heart rate.  Normal rhythm. No murmurs, rubs, or gallops.    MS:   All extremities are intact.    Neurologic:   Awake, alert, oriented x 3. No apparent focal neurological defect.         Results for orders placed or performed in visit on 06/07/22  POCT HgB A1C  Result Value Ref Range   Hemoglobin A1C 7.2 (A) 4.0 - 5.6 %   Est. average glucose Bld gHb Est-mCnc 140     Assessment & Plan     1. Type 2 diabetes mellitus with hyperglycemia, without long-term current use of insulin Well controlled.  Continue current medications.    2. Hypothyroidism, unspecified type Doing well since levothyroxine increased in February - TSH - T4, free  3. Osteoporosis,  unspecified osteoporosis type, unspecified pathological fracture presence  - DG Bone Density [place]; Future  4. B12 deficiency Now taking b12 supplements consistently every day.  - Vitamin B12  5. Primary insomnia Can try OTC melatonin nightly and prn Unisom  Future Appointments  Date Time Provider Department Center  09/29/2022 10:00 AM Malva Limes, MD [facility]  05/17/2023  1:30 PM [facility]ANNUAL WELLNESS VISIT [facility]            Mila Merry, MD  [facility] [phone] (phone) [phone] (fax)  [facility]

## 2022-06-07 NOTE — Patient Instructions (Signed)
.   Please review the attached list of medications and notify my office if there are any errors.   . Please bring all of your medications to every appointment so we can make sure that our medication list is the same as yours.   

## 2022-06-08 LAB — TSH: TSH: 0.455 u[IU]/mL (ref 0.450–4.500)

## 2022-06-08 LAB — T4, FREE: Free T4: 1.74 ng/dL (ref 0.82–1.77)

## 2022-06-08 LAB — VITAMIN B12: Vitamin B-12: 530 pg/mL (ref 232–1245)

## 2022-06-22 DIAGNOSIS — H353221 Exudative age-related macular degeneration, left eye, with active choroidal neovascularization: Secondary | ICD-10-CM | POA: Diagnosis not present

## 2022-07-14 DIAGNOSIS — I700 Atherosclerosis of aorta: Secondary | ICD-10-CM | POA: Diagnosis not present

## 2022-07-14 DIAGNOSIS — I4510 Unspecified right bundle-branch block: Secondary | ICD-10-CM | POA: Diagnosis not present

## 2022-07-14 DIAGNOSIS — I10 Essential (primary) hypertension: Secondary | ICD-10-CM | POA: Diagnosis not present

## 2022-07-14 DIAGNOSIS — R0609 Other forms of dyspnea: Secondary | ICD-10-CM | POA: Diagnosis not present

## 2022-07-14 DIAGNOSIS — I2510 Atherosclerotic heart disease of native coronary artery without angina pectoris: Secondary | ICD-10-CM | POA: Diagnosis not present

## 2022-07-14 DIAGNOSIS — I456 Pre-excitation syndrome: Secondary | ICD-10-CM | POA: Diagnosis not present

## 2022-07-14 DIAGNOSIS — E7800 Pure hypercholesterolemia, unspecified: Secondary | ICD-10-CM | POA: Diagnosis not present

## 2022-07-14 DIAGNOSIS — I491 Atrial premature depolarization: Secondary | ICD-10-CM | POA: Diagnosis not present

## 2022-07-27 DIAGNOSIS — H353221 Exudative age-related macular degeneration, left eye, with active choroidal neovascularization: Secondary | ICD-10-CM | POA: Diagnosis not present

## 2022-07-27 DIAGNOSIS — H353211 Exudative age-related macular degeneration, right eye, with active choroidal neovascularization: Secondary | ICD-10-CM | POA: Diagnosis not present

## 2022-08-11 ENCOUNTER — Telehealth: Payer: Self-pay | Admitting: Family Medicine

## 2022-08-11 NOTE — Telephone Encounter (Signed)
[facility] is requesting pescription refill Glipizide ER 2.5 MG tablet Please advise

## 2022-08-12 ENCOUNTER — Other Ambulatory Visit: Payer: Self-pay

## 2022-08-12 DIAGNOSIS — E1165 Type 2 diabetes mellitus with hyperglycemia: Secondary | ICD-10-CM

## 2022-08-12 MED ORDER — GLIPIZIDE ER 5 MG PO TB24: 5.0000 mg | ORAL_TABLET | Freq: Every day | ORAL | 3 refills | Status: DC

## 2022-08-13 ENCOUNTER — Other Ambulatory Visit: Payer: Self-pay | Admitting: Family Medicine

## 2022-08-13 DIAGNOSIS — E1165 Type 2 diabetes mellitus with hyperglycemia: Secondary | ICD-10-CM

## 2022-08-13 MED ORDER — GLIPIZIDE ER 2.5 MG PO TB24
2.5000 mg | ORAL_TABLET | Freq: Every day | ORAL | 1 refills | Status: DC
Start: 2022-08-13 — End: 2022-12-29

## 2022-08-13 NOTE — Telephone Encounter (Signed)
Patient req to go back down to 2.5 because 5mg  was too strong, per pharmacy request

## 2022-08-31 DIAGNOSIS — H353221 Exudative age-related macular degeneration, left eye, with active choroidal neovascularization: Secondary | ICD-10-CM | POA: Diagnosis not present

## 2022-09-16 DIAGNOSIS — I10 Essential (primary) hypertension: Secondary | ICD-10-CM | POA: Diagnosis not present

## 2022-09-16 DIAGNOSIS — H353290 Exudative age-related macular degeneration, unspecified eye, stage unspecified: Secondary | ICD-10-CM | POA: Diagnosis not present

## 2022-09-16 DIAGNOSIS — Z7984 Long term (current) use of oral hypoglycemic drugs: Secondary | ICD-10-CM | POA: Diagnosis not present

## 2022-09-16 DIAGNOSIS — Z6824 Body mass index (BMI) 24.0-24.9, adult: Secondary | ICD-10-CM | POA: Diagnosis not present

## 2022-09-16 DIAGNOSIS — E119 Type 2 diabetes mellitus without complications: Secondary | ICD-10-CM | POA: Diagnosis not present

## 2022-09-16 LAB — HEMOGLOBIN A1C: Hemoglobin A1C: 6.9

## 2022-09-23 ENCOUNTER — Other Ambulatory Visit: Payer: Self-pay | Admitting: Family Medicine

## 2022-09-23 DIAGNOSIS — E039 Hypothyroidism, unspecified: Secondary | ICD-10-CM

## 2022-09-23 DIAGNOSIS — E119 Type 2 diabetes mellitus without complications: Secondary | ICD-10-CM

## 2022-09-29 ENCOUNTER — Ambulatory Visit (INDEPENDENT_AMBULATORY_CARE_PROVIDER_SITE_OTHER): Payer: PPO | Admitting: Family Medicine

## 2022-09-29 ENCOUNTER — Encounter: Payer: Self-pay | Admitting: Oncology

## 2022-09-29 ENCOUNTER — Encounter: Payer: Self-pay | Admitting: Family Medicine

## 2022-09-29 VITALS — BP 115/67 | HR 64 | Temp 98.2°F | Resp 16 | Ht 63.0 in | Wt 151.0 lb

## 2022-09-29 DIAGNOSIS — E1165 Type 2 diabetes mellitus with hyperglycemia: Secondary | ICD-10-CM | POA: Diagnosis not present

## 2022-09-29 DIAGNOSIS — M810 Age-related osteoporosis without current pathological fracture: Secondary | ICD-10-CM | POA: Diagnosis not present

## 2022-09-29 DIAGNOSIS — I10 Essential (primary) hypertension: Secondary | ICD-10-CM

## 2022-09-29 DIAGNOSIS — E039 Hypothyroidism, unspecified: Secondary | ICD-10-CM | POA: Diagnosis not present

## 2022-09-29 DIAGNOSIS — E559 Vitamin D deficiency, unspecified: Secondary | ICD-10-CM

## 2022-09-29 NOTE — Patient Instructions (Signed)
.   Please review the attached list of medications and notify my office if there are any errors.   . Please bring all of your medications to every appointment so we can make sure that our medication list is the same as yours.   

## 2022-09-29 NOTE — Progress Notes (Signed)
Established patient visit   Patient: April Pittman   DOB: [birth year]   82 y.o. Female  MRN: [id number] Visit Date: 09/29/2022  Today's healthcare provider: Mila Merry, MD   Chief Complaint  Patient presents with   Diabetes    Patient was last seen in April.  Last A1C in April was 7.2.  Her insurance nurse did an A1C last week and it was 6.9.   skin lesion    Patient has a lesion on her left cheek that she would like you to look at.  She has made an appointment with the Dermatologist but it is not until October.  She does have history of melanoma.     Hypertension   Hypothyroidism   Subjective    Discussed the use of AI scribe software for clinical note transcription with the patient, who gave verbal consent to proceed.  History of Present Illness   The patient, with a history of diabetes and previous melanoma, presents for a routine checkup. She reports feeling tired due to poor sleep the previous night. She mentions that her recent HbA1c was 6.9, as reported by a visiting nurse.  The patient has noticed a persistent, scaly lesion on her skin under her left eye, which she has been unable to see clearly. She expresses concern due to her history of melanoma, although she notes that the current lesion does not resemble her previous melanoma of her foot excised many years ago.. She has scheduled an appointment with a dermatologist for further evaluation.  The patient also mentions receiving injections for an unspecified condition in her left eye. She reports no issues with her current prescriptions and has been receiving them regularly from her pharmacy. She has been eating regularly, including breakfast early in the morning.  The patient has a history of hypothyroid, hypertension and vitamin D deficiency, which are due for reevaluation. She also has a history of diabetic kidney disease, for which she is due for screening.     Lab Results  Component Value Date   TSH 0.455  06/07/2022   Lab Results  Component Value Date   NA 137 02/05/2022   K 3.7 02/05/2022   CREATININE 0.66 02/05/2022   EGFR 88 02/05/2022   GLUCOSE 110 (H) 02/05/2022   Lab Results  Component Value Date   CHOL 156 06/16/2021   HDL 53 06/16/2021   LDLCALC 75 06/16/2021   LDLDIRECT 73 05/11/2017   TRIG 168 (H) 06/16/2021   CHOLHDL 2.9 06/16/2021     Medications: Outpatient Medications Prior to Visit  Medication Sig   amLODipine (NORVASC) 5 MG tablet Take 5 mg by mouth daily.   aspirin EC 81 MG tablet Take 81 mg by mouth daily.   Blood Glucose Monitoring Suppl (ONE TOUCH ULTRA 2) w/Device KIT Use to check blood sugar daily for type 2 diabetes E11.9   calcium carbonate (OS-CAL) 1250 (500 Ca) MG chewable tablet Chew 1 tablet by mouth daily.   cholecalciferol (VITAMIN D) 25 MCG (1000 UNIT) tablet Take 5,000 Units by mouth daily.   clopidogrel (PLAVIX) 75 MG tablet TAKE 1 TABLET BY MOUTH EVERY DAY   glipiZIDE (GLUCOTROL XL) 2.5 MG 24 hr tablet Take 1 tablet (2.5 mg total) by mouth daily with breakfast.   glucose blood (ONETOUCH ULTRA) test strip USE TO CHECK BLOOD SUGAR DAILY. E 11.9 (TYPE 2 DIABETES MELLITIS)   levothyroxine (SYNTHROID) 125 MCG tablet TAKE 1 TABLET BY MOUTH EVERY DAY   lisinopril-hydrochlorothiazide (ZESTORETIC) 20-25  MG tablet Take 1 tablet by mouth daily.   metFORMIN (GLUCOPHAGE) 1000 MG tablet TAKE 1 TABLET (1,000 MG TOTAL) BY MOUTH 2 (TWO) TIMES DAILY. TAKE WITH FOOD   metoprolol tartrate (LOPRESSOR) 50 MG tablet Take 1 tablet (50 mg total) by mouth 2 (two) times daily.   simvastatin (ZOCOR) 40 MG tablet TAKE 1 TABLET BY MOUTH EVERY DAY FOR CHOLESTEROL   vitamin B-12 (CYANOCOBALAMIN) 1000 MCG tablet Take 1,000 mcg by mouth daily.    No facility-administered medications prior to visit.   Review of Systems     Objective    BP 115/67 (BP Location: Right Arm, Patient Position: Sitting, Cuff Size: Normal)   Pulse 64   Temp 98.2 F (36.8 C) (Oral)   Resp 16    Ht 5\' 3"  (1.6 m)   Wt 151 lb (68.5 kg)   BMI 26.75 kg/m   Physical Exam   HEENT: Throat normal. CHEST: Lungs clear to auscultation. CARDIOVASCULAR: Heart sounds normal. SKIN: Light brown approx 1cm slightly irregularly shaped lesion, raised about 1mm, on left upper cheek most consistently with seborrheic keratosis.     Assessment & Plan     Assessment and Plan    Diabetes Mellitus Recent HbA1c at home of 6.9, indicating good control. -Continue current management.  Skin Lesion History of melanoma. New scaly lesion on the face, not typical of melanoma but concerning due to history. Patient has upcoming an appointment with dermatology.  Hyperlipidemia and Hypothyroidism Due for routine monitoring. -Order fasting lipid panel and TSH. Patient to return to lab for blood draw within the next week.  Hypertension -Well controlled, continue current medications.    General Health Maintenance -Schedule follow-up appointment in six months. If any issues are identified on lab work, may need to schedule sooner.       Return in about 6 months (around 04/01/2023) for Diabetes.      Mila Merry, MD  [facility] [phone] (phone) [phone] (fax)  [facility]

## 2022-10-05 DIAGNOSIS — H353211 Exudative age-related macular degeneration, right eye, with active choroidal neovascularization: Secondary | ICD-10-CM | POA: Diagnosis not present

## 2022-10-05 DIAGNOSIS — H353221 Exudative age-related macular degeneration, left eye, with active choroidal neovascularization: Secondary | ICD-10-CM | POA: Diagnosis not present

## 2022-10-06 ENCOUNTER — Encounter: Payer: Self-pay | Admitting: Oncology

## 2022-10-07 DIAGNOSIS — E559 Vitamin D deficiency, unspecified: Secondary | ICD-10-CM | POA: Diagnosis not present

## 2022-10-07 DIAGNOSIS — E039 Hypothyroidism, unspecified: Secondary | ICD-10-CM | POA: Diagnosis not present

## 2022-10-07 DIAGNOSIS — E1165 Type 2 diabetes mellitus with hyperglycemia: Secondary | ICD-10-CM | POA: Diagnosis not present

## 2022-10-08 ENCOUNTER — Encounter: Payer: Self-pay | Admitting: Oncology

## 2022-10-08 ENCOUNTER — Encounter: Payer: Self-pay | Admitting: Family Medicine

## 2022-10-08 LAB — COMPREHENSIVE METABOLIC PANEL: Potassium: 3.7 mmol/L (ref 3.5–5.2)

## 2022-10-20 ENCOUNTER — Ambulatory Visit: Payer: PPO | Admitting: Obstetrics and Gynecology

## 2022-10-20 ENCOUNTER — Encounter: Payer: Self-pay | Admitting: Obstetrics and Gynecology

## 2022-10-20 VITALS — BP 132/69 | HR 59 | Ht 63.0 in | Wt 148.7 lb

## 2022-10-20 DIAGNOSIS — N814 Uterovaginal prolapse, unspecified: Secondary | ICD-10-CM | POA: Diagnosis not present

## 2022-10-20 DIAGNOSIS — Z4689 Encounter for fitting and adjustment of other specified devices: Secondary | ICD-10-CM

## 2022-10-20 NOTE — Progress Notes (Signed)
Patient presents for pessary maintenance. She states doing well since last visit, no pain or bleeding. Patient states no other questions or concerns at this time.

## 2022-10-20 NOTE — Progress Notes (Signed)
HPI:      [name] is a 82 y.o. [phone] who LMP was No LMP recorded. Patient is postmenopausal.  Subjective:   She presents today for pessary maintenance.  She reports that she is not having any issues.  Denies vaginal bleeding.  She says the pessary seems to be working.    Hx: The following portions of the patient's history were reviewed and updated as appropriate:             She  has a past medical history of Alopecia, Benign neoplasm of large bowel, CAD (coronary artery disease), Carpal tunnel syndrome, Complication of anesthesia, Erosion of cervix, H/O adenomatous polyp of colon, History of chicken pox, Malignant melanoma of foot (HCC), PONV (postoperative nausea and vomiting), Post-menopausal bleeding, Procidentia of uterus, Rectal bleeding, Sepsis (HCC) (02/18/2018), and TIA (transient ischemic attack). She does not have any pertinent problems on file. She  has a past surgical history that includes Tonsillectomy; Knee surgery; Cardiac catheterization (10/2010); Colonoscopy (03/2008); Breast excisional biopsy (Right); Cataract extraction w/PHACO (Right, 07/28/2016); Cataract extraction w/PHACO (Left, 10/27/2016); Esophagogastroduodenoscopy (egd) with propofol (N/A, 05/12/2018); and Colonoscopy with propofol (N/A, 05/12/2018). Her family history includes Diabetes in her father; Heart disease in her paternal grandmother and sister; Lung cancer in her brother; Rheumatic fever in her sister. She  reports that she quit smoking about 34 years ago. Her smoking use included cigarettes. She has never used smokeless tobacco. She reports that she does not drink alcohol and does not use drugs. She has a current medication list which includes the following prescription(s): amlodipine, aspirin ec, one touch ultra 2, calcium carbonate, cholecalciferol, clopidogrel, glipizide, onetouch ultra, levothyroxine, lisinopril-hydrochlorothiazide, metformin, metoprolol tartrate, simvastatin, and cyanocobalamin. She  has No Known Allergies.       Review of Systems:  Review of Systems  Constitutional: Denied constitutional symptoms, night sweats, recent illness, fatigue, fever, insomnia and weight loss.  Eyes: Denied eye symptoms, eye pain, photophobia, vision change and visual disturbance.  Ears/Nose/Throat/Neck: Denied ear, nose, throat or neck symptoms, hearing loss, nasal discharge, sinus congestion and sore throat.  Cardiovascular: Denied cardiovascular symptoms, arrhythmia, chest pain/pressure, edema, exercise intolerance, orthopnea and palpitations.  Respiratory: Denied pulmonary symptoms, asthma, pleuritic pain, productive sputum, cough, dyspnea and wheezing.  Gastrointestinal: Denied, gastro-esophageal reflux, melena, nausea and vomiting.  Genitourinary: Denied genitourinary symptoms including symptomatic vaginal discharge, pelvic relaxation issues, and urinary complaints.  Musculoskeletal: Denied musculoskeletal symptoms, stiffness, swelling, muscle weakness and myalgia.  Dermatologic: Denied dermatology symptoms, rash and scar.  Neurologic: Denied neurology symptoms, dizziness, headache, neck pain and syncope.  Psychiatric: Denied psychiatric symptoms, anxiety and depression.  Endocrine: Denied endocrine symptoms including hot flashes and night sweats.   Meds:   Current Outpatient Medications on File Prior to Visit  Medication Sig Dispense Refill   amLODipine (NORVASC) 5 MG tablet Take 5 mg by mouth daily.     aspirin EC 81 MG tablet Take 81 mg by mouth daily.     Blood Glucose Monitoring Suppl (ONE TOUCH ULTRA 2) w/Device KIT Use to check blood sugar daily for type 2 diabetes E11.9 1 kit 0   calcium carbonate (OS-CAL) 1250 (500 Ca) MG chewable tablet Chew 1 tablet by mouth daily.     cholecalciferol (VITAMIN D) 25 MCG (1000 UNIT) tablet Take 5,000 Units by mouth daily.     clopidogrel (PLAVIX) 75 MG tablet TAKE 1 TABLET BY MOUTH EVERY DAY 90 tablet 0   glipiZIDE (GLUCOTROL XL) 2.5 MG 24 hr  tablet Take 1  tablet (2.5 mg total) by mouth daily with breakfast. 90 tablet 1   glucose blood (ONETOUCH ULTRA) test strip USE TO CHECK BLOOD SUGAR DAILY. E 11.9 (TYPE 2 DIABETES MELLITIS) 100 strip 4   levothyroxine (SYNTHROID) 125 MCG tablet TAKE 1 TABLET BY MOUTH EVERY DAY 90 tablet 1   lisinopril-hydrochlorothiazide (ZESTORETIC) 20-25 MG tablet Take 1 tablet by mouth daily. 90 tablet 3   metFORMIN (GLUCOPHAGE) 1000 MG tablet TAKE 1 TABLET (1,000 MG TOTAL) BY MOUTH 2 (TWO) TIMES DAILY. TAKE WITH FOOD 180 tablet 1   metoprolol tartrate (LOPRESSOR) 50 MG tablet Take 1 tablet (50 mg total) by mouth 2 (two) times daily. 60 tablet 12   simvastatin (ZOCOR) 40 MG tablet TAKE 1 TABLET BY MOUTH EVERY DAY FOR CHOLESTEROL 90 tablet 4   vitamin B-12 (CYANOCOBALAMIN) 1000 MCG tablet Take 1,000 mcg by mouth daily.      No current facility-administered medications on file prior to visit.      Objective:     Vitals:   10/20/22 1105  BP: 132/69  Pulse: (!) 59   Filed Weights   10/20/22 1105  Weight: 148 lb 11.2 oz (67.4 kg)              Pessary Care Pessary removed and cleaned.  Vagina checked by speculum exam- without erosions - pessary replaced.           Assessment:    [id number] Patient Active Problem List   Diagnosis Date Noted   Hemorrhoids 07/21/2021   Cystocele with prolapse 02/04/2020   PAC (premature atrial contraction) 05/11/2019   Short PR-normal QRS complex syndrome 05/11/2019   Right bundle branch block 05/11/2019   Aortic atherosclerosis (HCC) 01/05/2019   Thrombocytosis 04/14/2018   Borderline blood pressure 04/14/2018   Hiatal hernia 03/24/2018   Cholelithiases 03/24/2018   Iron deficiency anemia 03/04/2018   Cataract cortical, senile, bilateral 05/11/2017   Primary osteoarthritis of right knee 11/02/2015   Vitamin D deficiency 08/14/2014   Rectal/anal hemorrhage 06/14/2014   Hypothyroidism 06/14/2014   Hypertension 06/14/2014   Hypercholesteremia 06/14/2014    TIA (transient ischemic attack) 06/14/2014   Gastroesophageal reflux disease 06/14/2014   Alopecia 06/14/2014   Malignant melanoma of foot (HCC) 06/14/2014   Irritable bowel 06/14/2014   Coronary artery disease 06/14/2014   Type 2 diabetes mellitus with hyperglycemia, without long-term current use of insulin (HCC) 06/14/2014   Carpal tunnel syndrome 06/14/2014   Procidentia of uterus 06/14/2014   Osteoporosis 06/14/2014   History of adenomatous polyp of colon 06/14/2014   B12 deficiency 08/08/2008   Colon, diverticulosis 05/14/2008     1. Encounter for pessary maintenance   2. Cystocele with prolapse     Patient doing very well with pessary   Plan:            1.  Follow-up for pessary maintenance and care in January. Orders No orders of the defined types were placed in this encounter.   No orders of the defined types were placed in this encounter.     F/U  No follow-ups on file.  Elonda Husky, M.D. 10/20/2022 11:24 AM

## 2022-11-09 DIAGNOSIS — H353221 Exudative age-related macular degeneration, left eye, with active choroidal neovascularization: Secondary | ICD-10-CM | POA: Diagnosis not present

## 2022-11-29 DIAGNOSIS — D2262 Melanocytic nevi of left upper limb, including shoulder: Secondary | ICD-10-CM | POA: Diagnosis not present

## 2022-11-29 DIAGNOSIS — D2272 Melanocytic nevi of left lower limb, including hip: Secondary | ICD-10-CM | POA: Diagnosis not present

## 2022-11-29 DIAGNOSIS — D2261 Melanocytic nevi of right upper limb, including shoulder: Secondary | ICD-10-CM | POA: Diagnosis not present

## 2022-11-29 DIAGNOSIS — D225 Melanocytic nevi of trunk: Secondary | ICD-10-CM | POA: Diagnosis not present

## 2022-11-29 DIAGNOSIS — L570 Actinic keratosis: Secondary | ICD-10-CM | POA: Diagnosis not present

## 2022-11-29 DIAGNOSIS — D2271 Melanocytic nevi of right lower limb, including hip: Secondary | ICD-10-CM | POA: Diagnosis not present

## 2022-11-29 DIAGNOSIS — L821 Other seborrheic keratosis: Secondary | ICD-10-CM | POA: Diagnosis not present

## 2022-12-07 DIAGNOSIS — E119 Type 2 diabetes mellitus without complications: Secondary | ICD-10-CM | POA: Diagnosis not present

## 2022-12-07 DIAGNOSIS — H26491 Other secondary cataract, right eye: Secondary | ICD-10-CM | POA: Diagnosis not present

## 2022-12-07 DIAGNOSIS — H353211 Exudative age-related macular degeneration, right eye, with active choroidal neovascularization: Secondary | ICD-10-CM | POA: Diagnosis not present

## 2022-12-07 DIAGNOSIS — H353221 Exudative age-related macular degeneration, left eye, with active choroidal neovascularization: Secondary | ICD-10-CM | POA: Diagnosis not present

## 2022-12-07 LAB — HM DIABETES EYE EXAM

## 2022-12-14 DIAGNOSIS — H353211 Exudative age-related macular degeneration, right eye, with active choroidal neovascularization: Secondary | ICD-10-CM | POA: Diagnosis not present

## 2022-12-14 DIAGNOSIS — H353221 Exudative age-related macular degeneration, left eye, with active choroidal neovascularization: Secondary | ICD-10-CM | POA: Diagnosis not present

## 2022-12-27 ENCOUNTER — Telehealth: Payer: Self-pay

## 2022-12-27 DIAGNOSIS — I456 Pre-excitation syndrome: Secondary | ICD-10-CM

## 2022-12-27 DIAGNOSIS — I491 Atrial premature depolarization: Secondary | ICD-10-CM

## 2022-12-27 DIAGNOSIS — I2510 Atherosclerotic heart disease of native coronary artery without angina pectoris: Secondary | ICD-10-CM

## 2022-12-27 NOTE — Telephone Encounter (Signed)
Copied from CRM [id number]. Topic: Referral - Request for Referral >> Dec 27, 2022  9:17 AM Marlow Baars wrote: Has patient seen PCP for this complaint? Yes.   Referral for which specialty: Cardiology Preferred provider/office: Cardiologist that her provider recommends that is in her network Reason for referral: She would like to have a cardiologist that is in her network with [facility] and [facility] as her current one is with [facility] and out of network.  Please assist patient further

## 2022-12-28 ENCOUNTER — Other Ambulatory Visit: Payer: Self-pay | Admitting: Family Medicine

## 2022-12-28 DIAGNOSIS — E1165 Type 2 diabetes mellitus with hyperglycemia: Secondary | ICD-10-CM

## 2023-01-04 ENCOUNTER — Other Ambulatory Visit: Payer: Self-pay | Admitting: Family Medicine

## 2023-01-04 NOTE — Telephone Encounter (Signed)
Medication Refill -  Most Recent Primary Care Visit:  Provider: Malva Limes  Department: [facility]  Visit Type: OFFICE VISIT  Date: 09/29/2022  Medication: amLODipine (NORVASC) 5 MG tablet ,lisinopril-hydrochlorothiazide (ZESTORETIC) 20-25 MG tablet   Pt stated Dr.Fisher took her off medication amLODipine (NORVASC) 5 MG tablet. As she started to get better, her blood pressure went up, and Dr. Lady Gary, Glenetta Borg, MD, put her back on it he has now retired. She stated she mentioned to PCP that she wanted him to prescribe it, but he did not send it in. Please advise.  Has the patient contacted their pharmacy? Yes  (Agent: If yes, when and what did the pharmacy advise?)  Is this the correct pharmacy for this prescription? Yes If no, delete pharmacy and type the correct one.  This is the patient's preferred pharmacy:  [id number] - [place], [place] - [address] [place] [address] Phone: [phone] Fax: [phone]   Has the prescription been filled recently? No  Is the patient out of the medication? Yes  Has the patient been seen for an appointment in the last year OR does the patient have an upcoming appointment? Yes  Can we respond through MyChart? No  Agent: Please be advised that Rx refills may take up to 3 business days. We ask that you follow-up with your pharmacy.

## 2023-01-05 DIAGNOSIS — E7800 Pure hypercholesterolemia, unspecified: Secondary | ICD-10-CM | POA: Diagnosis not present

## 2023-01-05 DIAGNOSIS — I456 Pre-excitation syndrome: Secondary | ICD-10-CM | POA: Diagnosis not present

## 2023-01-05 DIAGNOSIS — I2510 Atherosclerotic heart disease of native coronary artery without angina pectoris: Secondary | ICD-10-CM | POA: Diagnosis not present

## 2023-01-05 DIAGNOSIS — I700 Atherosclerosis of aorta: Secondary | ICD-10-CM | POA: Diagnosis not present

## 2023-01-05 DIAGNOSIS — I491 Atrial premature depolarization: Secondary | ICD-10-CM | POA: Diagnosis not present

## 2023-01-05 DIAGNOSIS — I4510 Unspecified right bundle-branch block: Secondary | ICD-10-CM | POA: Diagnosis not present

## 2023-01-05 DIAGNOSIS — I10 Essential (primary) hypertension: Secondary | ICD-10-CM | POA: Diagnosis not present

## 2023-01-05 MED ORDER — LISINOPRIL-HYDROCHLOROTHIAZIDE 20-25 MG PO TABS: 1.0000 | ORAL_TABLET | Freq: Every day | ORAL | 0 refills | Status: AC

## 2023-01-05 NOTE — Telephone Encounter (Signed)
Requested medication (s) are due for refill today: yes  Requested medication (s) are on the active medication list:yes  Last refill:  04/29/21  Future visit scheduled: yes  Notes to clinic:  historical med or provider   Requested Prescriptions  Pending Prescriptions Disp Refills   amLODipine (NORVASC) 5 MG tablet      Sig: Take 1 tablet (5 mg total) by mouth daily.     Cardiovascular: Calcium Channel Blockers 2 Passed - 01/04/2023  5:10 PM      Passed - Last BP in normal range    BP Readings from Last 1 Encounters:  10/20/22 132/69         Passed - Last Heart Rate in normal range    Pulse Readings from Last 1 Encounters:  10/20/22 (!) 59         Passed - Valid encounter within last 6 months    Recent Outpatient Visits           3 months ago Osteoporosis, unspecified osteoporosis type, unspecified pathological fracture presence   [place] [facility] Malva Limes, MD   7 months ago Type 2 diabetes mellitus with hyperglycemia, without long-term current use of insulin (HCC)   [facility] Malva Limes, MD   11 months ago Type 2 diabetes mellitus with hyperglycemia, without long-term current use of insulin (HCC)   [place] [facility] Malva Limes, MD   1 year ago Dysuria   [place] [facility] [place], [place], PA-C   1 year ago Hemorrhoids, unspecified hemorrhoid type   [facility] Alfredia Ferguson, PA-C       Future Appointments             In 3 months Fisher, Demetrios Isaacs, MD [facility]            Signed Prescriptions Disp Refills   lisinopril-hydrochlorothiazide (ZESTORETIC) 20-25 MG tablet 90 tablet 0    Sig: Take 1 tablet by mouth daily.     Cardiovascular:  ACEI + Diuretic Combos Passed - 01/04/2023  5:10 PM      Passed - Na in normal range and within 180 days    Sodium  Date Value Ref Range Status   10/07/2022 135 134 - 144 mmol/L Final         Passed - K in normal range and within 180 days    Potassium  Date Value Ref Range Status  10/07/2022 3.7 3.5 - 5.2 mmol/L Final         Passed - Cr in normal range and within 180 days    Creatinine, Ser  Date Value Ref Range Status  10/07/2022 0.69 0.57 - 1.00 mg/dL Final   Creatinine, POC  Date Value Ref Range Status  10/05/2016 n/a mg/dL Final         Passed - eGFR is 30 or above and within 180 days    GFR calc Af Amer  Date Value Ref Range Status  05/11/2019 96 >59 mL/min/1.73 Final   GFR, Estimated  Date Value Ref Range Status  10/02/2021 >60 >60 mL/min Final    Comment:    (NOTE) Calculated using the CKD-EPI Creatinine Equation (2021)    eGFR  Date Value Ref Range Status  10/07/2022 87 >59 mL/min/1.73 Final         Passed - Patient is not pregnant      Passed - Last BP in normal  range    BP Readings from Last 1 Encounters:  10/20/22 132/69         Passed - Valid encounter within last 6 months    Recent Outpatient Visits           3 months ago Osteoporosis, unspecified osteoporosis type, unspecified pathological fracture presence   [place] [facility] Malva Limes, MD   7 months ago Type 2 diabetes mellitus with hyperglycemia, without long-term current use of insulin [facility])   [place] [facility] Malva Limes, MD   11 months ago Type 2 diabetes mellitus with hyperglycemia, without long-term current use of insulin [facility])   [facility] Malva Limes, MD   1 year ago Dysuria   [place] [facility] [place], [place], PA-C   1 year ago Hemorrhoids, unspecified hemorrhoid type   [facility] Alfredia Ferguson, PA-C       Future Appointments             In 3 months Fisher, Demetrios Isaacs, MD [facility]

## 2023-01-05 NOTE — Telephone Encounter (Signed)
Requested Prescriptions  Pending Prescriptions Disp Refills   amLODipine (NORVASC) 5 MG tablet      Sig: Take 1 tablet (5 mg total) by mouth daily.     Cardiovascular: Calcium Channel Blockers 2 Passed - 01/04/2023  5:10 PM      Passed - Last BP in normal range    BP Readings from Last 1 Encounters:  10/20/22 132/69         Passed - Last Heart Rate in normal range    Pulse Readings from Last 1 Encounters:  10/20/22 (!) 59         Passed - Valid encounter within last 6 months    Recent Outpatient Visits           3 months ago Osteoporosis, unspecified osteoporosis type, unspecified pathological fracture presence   [place] [facility] Malva Limes, MD   7 months ago Type 2 diabetes mellitus with hyperglycemia, without long-term current use of insulin (HCC)   [facility] L[facility], MD   11 months ago Type 2 diabetes mellitus with hyperglycemia, without long-term current use of insulin (HCC)   [place] [place] [facility] Malva Limes, MD   1 year ago Dysuria   [place] [facility] [place], [place], PA-C   1 year ago Hemorrhoids, unspecified hemorrhoid type   [facility] Alfredia Ferguson, PA-C       Future Appointments             In 3 months Fisher, Demetrios Isaacs, MD [facility]             lisinopril-hydrochlorothiazide (ZESTORETIC) 20-25 MG tablet 90 tablet 0    Sig: Take 1 tablet by mouth daily.     Cardiovascular:  ACEI + Diuretic Combos Passed - 01/04/2023  5:10 PM      Passed - Na in normal range and within 180 days    Sodium  Date Value Ref Range Status  10/07/2022 135 134 - 144 mmol/L Final         Passed - K in normal range and within 180 days    Potassium  Date Value Ref Range Status  10/07/2022 3.7 3.5 - 5.2 mmol/L Final         Passed - Cr in normal range and within 180 days    Creatinine, Ser  Date Value  Ref Range Status  10/07/2022 0.69 0.57 - 1.00 mg/dL Final   Creatinine, POC  Date Value Ref Range Status  10/05/2016 n/a mg/dL Final         Passed - eGFR is 30 or above and within 180 days    GFR calc Af Amer  Date Value Ref Range Status  05/11/2019 96 >59 mL/min/1.73 Final   GFR, Estimated  Date Value Ref Range Status  10/02/2021 >60 >60 mL/min Final    Comment:    (NOTE) Calculated using the CKD-EPI Creatinine Equation (2021)    eGFR  Date Value Ref Range Status  10/07/2022 87 >59 mL/min/1.73 Final         Passed - Patient is not pregnant      Passed - Last BP in normal range    BP Readings from Last 1 Encounters:  10/20/22 132/69         Passed - Valid encounter within last 6 months    Recent Outpatient Visits  3 months ago Osteoporosis, unspecified osteoporosis type, unspecified pathological fracture presence   [place] [facility] Malva Limes, MD   7 months ago Type 2 diabetes mellitus with hyperglycemia, without long-term current use of insulin [facility])   [place] [facility] Malva Limes, MD   11 months ago Type 2 diabetes mellitus with hyperglycemia, without long-term current use of insulin [facility])   [place] [facility] Malva Limes, MD   1 year ago Dysuria   [place] [facility] [place], [place], PA-C   1 year ago Hemorrhoids, unspecified hemorrhoid type   [facility] Alfredia Ferguson, PA-C       Future Appointments             In 3 months Fisher, Demetrios Isaacs, MD [facility]

## 2023-01-06 MED ORDER — AMLODIPINE BESYLATE 5 MG PO TABS: 5.0000 mg | ORAL_TABLET | Freq: Every day | ORAL | 3 refills | Status: DC

## 2023-01-25 DIAGNOSIS — H353211 Exudative age-related macular degeneration, right eye, with active choroidal neovascularization: Secondary | ICD-10-CM | POA: Diagnosis not present

## 2023-01-25 DIAGNOSIS — H353221 Exudative age-related macular degeneration, left eye, with active choroidal neovascularization: Secondary | ICD-10-CM | POA: Diagnosis not present

## 2023-01-25 DIAGNOSIS — H353231 Exudative age-related macular degeneration, bilateral, with active choroidal neovascularization: Secondary | ICD-10-CM | POA: Diagnosis not present

## 2023-03-15 ENCOUNTER — Ambulatory Visit: Payer: PPO | Admitting: Obstetrics and Gynecology

## 2023-03-21 DIAGNOSIS — H353221 Exudative age-related macular degeneration, left eye, with active choroidal neovascularization: Secondary | ICD-10-CM | POA: Diagnosis not present

## 2023-03-21 DIAGNOSIS — H353211 Exudative age-related macular degeneration, right eye, with active choroidal neovascularization: Secondary | ICD-10-CM | POA: Diagnosis not present

## 2023-04-02 ENCOUNTER — Other Ambulatory Visit: Payer: Self-pay | Admitting: Family Medicine

## 2023-04-02 DIAGNOSIS — E039 Hypothyroidism, unspecified: Secondary | ICD-10-CM

## 2023-04-08 ENCOUNTER — Ambulatory Visit: Payer: PPO | Admitting: Family Medicine

## 2023-04-08 ENCOUNTER — Encounter: Payer: Self-pay | Admitting: Family Medicine

## 2023-04-08 VITALS — BP 113/63 | HR 68 | Temp 98.3°F | Resp 16 | Wt 144.0 lb

## 2023-04-08 DIAGNOSIS — E039 Hypothyroidism, unspecified: Secondary | ICD-10-CM | POA: Diagnosis not present

## 2023-04-08 DIAGNOSIS — E1165 Type 2 diabetes mellitus with hyperglycemia: Secondary | ICD-10-CM | POA: Diagnosis not present

## 2023-04-08 DIAGNOSIS — E559 Vitamin D deficiency, unspecified: Secondary | ICD-10-CM | POA: Diagnosis not present

## 2023-04-08 DIAGNOSIS — R2689 Other abnormalities of gait and mobility: Secondary | ICD-10-CM | POA: Diagnosis not present

## 2023-04-08 DIAGNOSIS — Z23 Encounter for immunization: Secondary | ICD-10-CM | POA: Diagnosis not present

## 2023-04-08 DIAGNOSIS — H547 Unspecified visual loss: Secondary | ICD-10-CM

## 2023-04-08 DIAGNOSIS — I10 Essential (primary) hypertension: Secondary | ICD-10-CM | POA: Diagnosis not present

## 2023-04-08 DIAGNOSIS — R300 Dysuria: Secondary | ICD-10-CM

## 2023-04-08 DIAGNOSIS — Z7984 Long term (current) use of oral hypoglycemic drugs: Secondary | ICD-10-CM

## 2023-04-08 LAB — POCT URINALYSIS DIPSTICK
Bilirubin, UA: NEGATIVE
Blood, UA: NEGATIVE
Glucose, UA: NEGATIVE
Ketones, UA: 80
Leukocytes, UA: NEGATIVE
Nitrite, UA: NEGATIVE
Odor: POSITIVE
Protein, UA: NEGATIVE
Spec Grav, UA: 1.015 (ref 1.010–1.025)
Urobilinogen, UA: 0.2 U/dL
pH, UA: 6 (ref 5.0–8.0)
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
## 2023-04-08 NOTE — Patient Instructions (Signed)
Marland Kitchen  Please review the attached list of medications and notify my office if there are any errors.   . Please bring all of your medications to every appointment so we can make sure that our medication list is the same as yours.

## 2023-04-09 LAB — HEMOGLOBIN A1C
Est. average glucose Bld gHb Est-mCnc: 171 mg/dL
Hgb A1c MFr Bld: 7.6 % — ABNORMAL HIGH (ref 4.8–5.6)

## 2023-04-09 LAB — RENAL FUNCTION PANEL
Albumin: 4.2 g/dL (ref 3.7–4.7)
BUN/Creatinine Ratio: 13 (ref 12–28)
BUN: 10 mg/dL (ref 8–27)
CO2: 26 mmol/L (ref 20–29)
Calcium: 9.5 mg/dL (ref 8.7–10.3)
Chloride: 99 mmol/L (ref 96–106)
Creatinine, Ser: 0.79 mg/dL (ref 0.57–1.00)
Glucose: 131 mg/dL — ABNORMAL HIGH (ref 70–99)
Phosphorus: 3.3 mg/dL (ref 3.0–4.3)
Potassium: 4 mmol/L (ref 3.5–5.2)
Sodium: 140 mmol/L (ref 134–144)
eGFR: 75 mL/min/{1.73_m2} (ref >59)

## 2023-04-09 LAB — T4, FREE: Free T4: 1.54 ng/dL (ref 0.82–1.77)

## 2023-04-09 LAB — TSH: TSH: 0.218 u[IU]/mL — ABNORMAL LOW (ref 0.450–4.500)

## 2023-04-09 LAB — VITAMIN D 25 HYDROXY (VIT D DEFICIENCY, FRACTURES): Vit D, 25-Hydroxy: 26.1 ng/mL — ABNORMAL LOW (ref 30.0–100.0)

## 2023-04-11 ENCOUNTER — Telehealth: Payer: Self-pay

## 2023-04-11 ENCOUNTER — Other Ambulatory Visit: Payer: Self-pay | Admitting: Family Medicine

## 2023-04-11 ENCOUNTER — Telehealth: Payer: Self-pay | Admitting: Family Medicine

## 2023-04-11 DIAGNOSIS — E1165 Type 2 diabetes mellitus with hyperglycemia: Secondary | ICD-10-CM

## 2023-04-11 DIAGNOSIS — E119 Type 2 diabetes mellitus without complications: Secondary | ICD-10-CM

## 2023-04-11 LAB — URINE CULTURE

## 2023-04-11 NOTE — Telephone Encounter (Signed)
 Patient would like a letter from the doctor about her mail box. Patietn would like the mail to be left in the mail box that's is in her porc. She is also asking if the placard is ready. Patient reports that the placard needs to be under her daughter's name Irven Shelling

## 2023-04-11 NOTE — Telephone Encounter (Signed)
 No answer ,no VM set up on [phone]..Followed up with Daughter to in form patient that her parking placard is ready for pick up.

## 2023-04-11 NOTE — Telephone Encounter (Signed)
 Both of those were completed last week and sent to medical records

## 2023-04-12 ENCOUNTER — Ambulatory Visit: Payer: PPO | Admitting: Obstetrics and Gynecology

## 2023-04-12 ENCOUNTER — Telehealth: Payer: Self-pay | Admitting: Family Medicine

## 2023-04-12 ENCOUNTER — Encounter: Payer: Self-pay | Admitting: Obstetrics and Gynecology

## 2023-04-12 VITALS — BP 134/65 | HR 70 | Ht 63.0 in | Wt 146.1 lb

## 2023-04-12 DIAGNOSIS — Z4689 Encounter for fitting and adjustment of other specified devices: Secondary | ICD-10-CM | POA: Diagnosis not present

## 2023-04-12 DIAGNOSIS — N814 Uterovaginal prolapse, unspecified: Secondary | ICD-10-CM | POA: Diagnosis not present

## 2023-04-12 MED ORDER — TRIMO-SAN 0.025 % VA GEL
VAGINAL | 2 refills | Status: AC
Start: 2023-04-12 — End: ?

## 2023-04-12 NOTE — Progress Notes (Signed)
 HPI:      [name] is a 83 y.o. [phone] who LMP was No LMP recorded. Patient is postmenopausal.  Subjective:   She presents today for pessary care.  She reports no problems with the pessary.  Denies vaginal bleeding.  Not using vaginal cream as often as suggested.    Hx: The following portions of the patient's history were reviewed and updated as appropriate:             She  has a past medical history of Alopecia, Benign neoplasm of large bowel, CAD (coronary artery disease), Carpal tunnel syndrome, Complication of anesthesia, Erosion of cervix, H/O adenomatous polyp of colon, History of chicken pox, Malignant melanoma of foot (HCC), PONV (postoperative nausea and vomiting), Post-menopausal bleeding, Procidentia of uterus, Rectal bleeding, Sepsis (HCC) (02/18/2018), and TIA (transient ischemic attack). She does not have any pertinent problems on file. She  has a past surgical history that includes Tonsillectomy; Knee surgery; Cardiac catheterization (10/2010); Colonoscopy (03/2008); Breast excisional biopsy (Right); Cataract extraction w/PHACO (Right, 07/28/2016); Cataract extraction w/PHACO (Left, 10/27/2016); Esophagogastroduodenoscopy (egd) with propofol (N/A, 05/12/2018); and Colonoscopy with propofol (N/A, 05/12/2018). Her family history includes Diabetes in her father; Heart disease in her paternal grandmother and sister; Lung cancer in her brother; Rheumatic fever in her sister. She  reports that she quit smoking about 35 years ago. Her smoking use included cigarettes. She has never used smokeless tobacco. She reports that she does not drink alcohol and does not use drugs. She has a current medication list which includes the following prescription(s): amlodipine, aspirin ec, one touch ultra 2, clopidogrel, glipizide, levothyroxine, lisinopril-hydrochlorothiazide, metformin, metoprolol tartrate, onetouch ultra, trimo-san, simvastatin, calcium carbonate, cholecalciferol, and  cyanocobalamin. She has no known allergies.       Review of Systems:  Review of Systems  Constitutional: Denied constitutional symptoms, night sweats, recent illness, fatigue, fever, insomnia and weight loss.  Eyes: Denied eye symptoms, eye pain, photophobia, vision change and visual disturbance.  Ears/Nose/Throat/Neck: Denied ear, nose, throat or neck symptoms, hearing loss, nasal discharge, sinus congestion and sore throat.  Cardiovascular: Denied cardiovascular symptoms, arrhythmia, chest pain/pressure, edema, exercise intolerance, orthopnea and palpitations.  Respiratory: Denied pulmonary symptoms, asthma, pleuritic pain, productive sputum, cough, dyspnea and wheezing.  Gastrointestinal: Denied, gastro-esophageal reflux, melena, nausea and vomiting.  Genitourinary: Denied genitourinary symptoms including symptomatic vaginal discharge, pelvic relaxation issues, and urinary complaints.  Musculoskeletal: Denied musculoskeletal symptoms, stiffness, swelling, muscle weakness and myalgia.  Dermatologic: Denied dermatology symptoms, rash and scar.  Neurologic: Denied neurology symptoms, dizziness, headache, neck pain and syncope.  Psychiatric: Denied psychiatric symptoms, anxiety and depression.  Endocrine: Denied endocrine symptoms including hot flashes and night sweats.   Meds:   Current Outpatient Medications on File Prior to Visit  Medication Sig Dispense Refill   amLODipine (NORVASC) 5 MG tablet Take 1 tablet (5 mg total) by mouth daily. 90 tablet 3   aspirin EC 81 MG tablet Take 81 mg by mouth daily.     Blood Glucose Monitoring Suppl (ONE TOUCH ULTRA 2) w/Device KIT Use to check blood sugar daily for type 2 diabetes E11.9 1 kit 0   clopidogrel (PLAVIX) 75 MG tablet TAKE 1 TABLET BY MOUTH EVERY DAY 90 tablet 0   glipiZIDE (GLUCOTROL XL) 2.5 MG 24 hr tablet TAKE 1 TABLET BY MOUTH DAILY WITH BREAKFAST. 90 tablet 1   levothyroxine (SYNTHROID) 125 MCG tablet TAKE 1 TABLET BY MOUTH EVERY  DAY 90 tablet 4   lisinopril-hydrochlorothiazide (ZESTORETIC) 20-25 MG tablet Take  1 tablet by mouth daily. 90 tablet 0   metFORMIN (GLUCOPHAGE) 1000 MG tablet TAKE 1 TABLET (1,000 MG TOTAL) BY MOUTH 2 (TWO) TIMES DAILY. TAKE WITH FOOD 180 tablet 4   metoprolol tartrate (LOPRESSOR) 50 MG tablet Take 1 tablet (50 mg total) by mouth 2 (two) times daily. 60 tablet 12   ONETOUCH ULTRA test strip USE TO CHECK BLOOD SUGAR DAILY. E 11.9 (TYPE 2 DIABETES MELLITIS) 100 strip 4   simvastatin (ZOCOR) 40 MG tablet TAKE 1 TABLET BY MOUTH EVERY DAY FOR CHOLESTEROL 90 tablet 4   calcium carbonate (OS-CAL) 1250 (500 Ca) MG chewable tablet Chew 1 tablet by mouth daily. (Patient not taking: Reported on 04/08/2023)     cholecalciferol (VITAMIN D) 25 MCG (1000 UNIT) tablet Take 5,000 Units by mouth daily. (Patient not taking: Reported on 04/12/2023)     vitamin B-12 (CYANOCOBALAMIN) 1000 MCG tablet Take 1,000 mcg by mouth daily.  (Patient not taking: Reported on 04/08/2023)     No current facility-administered medications on file prior to visit.      Objective:     Vitals:   04/12/23 1437  BP: 134/65  Pulse: 70   Filed Weights   04/12/23 1437  Weight: 146 lb 1.6 oz (66.3 kg)              Pessary Care Pessary removed and cleaned.  Vagina checked by speculum exam- without erosions - pessary replaced.           Assessment:    [id number] Patient Active Problem List   Diagnosis Date Noted   Hemorrhoids 07/21/2021   Cystocele with prolapse 02/04/2020   PAC (premature atrial contraction) 05/11/2019   Short PR-normal QRS complex syndrome 05/11/2019   Right bundle branch block 05/11/2019   Aortic atherosclerosis (HCC) 01/05/2019   Thrombocytosis 04/14/2018   Borderline blood pressure 04/14/2018   Hiatal hernia 03/24/2018   Cholelithiases 03/24/2018   Iron deficiency anemia 03/04/2018   Cataract cortical, senile, bilateral 05/11/2017   Primary osteoarthritis of right knee 11/02/2015   Vitamin D  deficiency 08/14/2014   Rectal/anal hemorrhage 06/14/2014   Hypothyroidism 06/14/2014   Hypertension 06/14/2014   Hypercholesteremia 06/14/2014   TIA (transient ischemic attack) 06/14/2014   Gastroesophageal reflux disease 06/14/2014   Alopecia 06/14/2014   Malignant melanoma of foot (HCC) 06/14/2014   Irritable bowel 06/14/2014   Coronary artery disease 06/14/2014   Type 2 diabetes mellitus with hyperglycemia, without long-term current use of insulin (HCC) 06/14/2014   Carpal tunnel syndrome 06/14/2014   Procidentia of uterus 06/14/2014   Osteoporosis 06/14/2014   History of adenomatous polyp of colon 06/14/2014   B12 deficiency 08/08/2008   Colon, diverticulosis 05/14/2008     1. Encounter for pessary maintenance   2. Cystocele with prolapse        Plan:            1.  Clean and reevaluate pessary in 5 months  2.  Patient encouraged to use cream more regularly. Orders No orders of the defined types were placed in this encounter.    Meds ordered this encounter  Medications   OXYQUINOLONE SULFATE VAGINAL (TRIMO-SAN) 0.025 % GEL    Sig: INSERT 1/3 APPLICATORFUL VAGINALLY EVERY OTHER DAY AS DIRECTED    Dispense:  113 g    Refill:  2      F/U  Return in about 5 months (around 09/09/2023).  Elonda Husky, M.D. 04/12/2023 3:24 PM

## 2023-04-12 NOTE — Progress Notes (Signed)
 Patient presents for pessary maintenance. She states no discomfort or bleeding. Patient states no other questions or concerns at this time.

## 2023-04-13 NOTE — Telephone Encounter (Signed)
 Opened in error

## 2023-05-18 ENCOUNTER — Encounter

## 2023-06-07 DIAGNOSIS — H353211 Exudative age-related macular degeneration, right eye, with active choroidal neovascularization: Secondary | ICD-10-CM | POA: Diagnosis not present

## 2023-06-07 DIAGNOSIS — H353221 Exudative age-related macular degeneration, left eye, with active choroidal neovascularization: Secondary | ICD-10-CM | POA: Diagnosis not present

## 2023-06-20 ENCOUNTER — Ambulatory Visit: Payer: Self-pay

## 2023-06-20 NOTE — Telephone Encounter (Signed)
 Chief Complaint: Left foot swelling and pain Symptoms: left foot swelling and pain Frequency: since Thursday Pertinent Negatives: Patient denies rash, fever, shortness of breath, chest pain Disposition: [] ED /[] Urgent Care (no appt availability in office) / [x] Appointment(In office/virtual)/ []  [facility]/ [] Home Care/ [] Refused Recommended Disposition /[] [facility]/ []  Follow-up with PCP Additional Notes: Patient called in stating she is having left foot swelling and pain that she noticed Thursday when trying on new shoes. Patient states pain has worsened near base of toes. Patient denies any noticeable shortness of breath but states she notices she gets more exhausted with daily activities. Patient denies left leg swelling, redness, or tenderness. Patient states she has not missed any medications recently but she states she has been eating too much salt and sugar recently, and knows she has been eating worse than she should. Patient denies history of heart failure and kidney disease, but states she has a history of heart attack. Pt appt made for tomorrow for further evaluation.   Copied from CRM [id number]. Topic: Clinical - Red Word Triage >> Jun 20, 2023  1:31 PM Ja-Kwan M wrote: Red Word that prompted transfer to Nurse Triage: foot swelling and painful Reason for Disposition  [1] Swollen foot AND [2] no fever  (Exceptions: localized bump from bunions, calluses, insect bite, sting)  Answer Assessment - Initial Assessment Questions 1. ONSET: "When did the pain start?"      Pain has been coming and going 2. LOCATION: "Where is the pain located?"      Last Thursday  3. PAIN: "How bad is the pain?"    (Scale 1-10; or mild, moderate, severe)  - MILD (1-3): doesn't interfere with normal activities.   - MODERATE (4-7): interferes with normal activities (e.g., work or school) or awakens from sleep, limping.   - SEVERE (8-10): excruciating pain, unable to do any normal  activities, unable to walk.      When touching base of toes it is about an 8 4. WORK OR EXERCISE: "Has there been any recent work or exercise that involved this part of the body?"      No 5. CAUSE: "What do you think is causing the foot pain?"     Patient is unsure 6. OTHER SYMPTOMS: "Do you have any other symptoms?" (e.g., leg pain, rash, fever, numbness)     No  Protocols used: Foot Pain-A-AH

## 2023-06-21 ENCOUNTER — Ambulatory Visit (INDEPENDENT_AMBULATORY_CARE_PROVIDER_SITE_OTHER): Admitting: Family Medicine

## 2023-06-21 ENCOUNTER — Ambulatory Visit
Admission: RE | Admit: 2023-06-21 | Discharge: 2023-06-21 | Disposition: A | Discharge status: Home / Self Care | Source: Ambulatory Visit | Attending: Family Medicine | Admitting: Family Medicine

## 2023-06-21 ENCOUNTER — Encounter: Payer: Self-pay | Admitting: Family Medicine

## 2023-06-21 VITALS — BP 102/58 | HR 74 | Resp 16 | Wt 140.9 lb

## 2023-06-21 DIAGNOSIS — G4700 Insomnia, unspecified: Secondary | ICD-10-CM | POA: Diagnosis not present

## 2023-06-21 DIAGNOSIS — R5383 Other fatigue: Secondary | ICD-10-CM | POA: Diagnosis not present

## 2023-06-21 DIAGNOSIS — M79672 Pain in left foot: Secondary | ICD-10-CM | POA: Diagnosis not present

## 2023-06-21 DIAGNOSIS — E538 Deficiency of other specified B group vitamins: Secondary | ICD-10-CM | POA: Diagnosis not present

## 2023-06-21 DIAGNOSIS — M2012 Hallux valgus (acquired), left foot: Secondary | ICD-10-CM | POA: Diagnosis not present

## 2023-06-21 DIAGNOSIS — M19072 Primary osteoarthritis, left ankle and foot: Secondary | ICD-10-CM | POA: Diagnosis not present

## 2023-06-21 DIAGNOSIS — E039 Hypothyroidism, unspecified: Secondary | ICD-10-CM

## 2023-06-21 MED ORDER — LEVOTHYROXINE SODIUM 112 MCG PO TABS: 112.0000 ug | ORAL_TABLET | Freq: Every day | ORAL | 1 refills | Status: DC

## 2023-06-21 NOTE — Patient Instructions (Signed)
 Please review the attached list of medications and notify my office if there are any errors.   Go to [facility]) Imaging at [facility] for your Xrays (phone no. [phone])

## 2023-06-22 LAB — VITAMIN B12: Vitamin B-12: 115 pg/mL — ABNORMAL LOW (ref 232–1245)

## 2023-06-22 LAB — CBC
Hematocrit: 35.1 % (ref 34.0–46.6)
Hemoglobin: 11 g/dL — ABNORMAL LOW (ref 11.1–15.9)
MCH: 24.8 pg — ABNORMAL LOW (ref 26.6–33.0)
MCHC: 31.3 g/dL — ABNORMAL LOW (ref 31.5–35.7)
MCV: 79 fL (ref 79–97)
Platelets: 430 10*3/uL (ref 150–450)
RBC: 4.43 x10E6/uL (ref 3.77–5.28)
RDW: 13.6 % (ref 11.7–15.4)
WBC: 10 10*3/uL (ref 3.4–10.8)

## 2023-06-22 LAB — URIC ACID: Uric Acid: 5.5 mg/dL (ref 3.1–7.9)

## 2023-06-23 ENCOUNTER — Other Ambulatory Visit: Payer: Self-pay | Admitting: Family Medicine

## 2023-06-23 ENCOUNTER — Ambulatory Visit: Payer: Self-pay

## 2023-06-23 DIAGNOSIS — E1165 Type 2 diabetes mellitus with hyperglycemia: Secondary | ICD-10-CM

## 2023-06-23 NOTE — Telephone Encounter (Signed)
 Copied from CRM [id number]. Topic: Clinical - Red Word Triage >> Jun 23, 2023 10:48 AM Elle L wrote: Red Word that prompted transfer to Nurse Triage: The patient had an appointment yesterday. However, her ankle is still swollen and painful. The patient is also concerned about her low blood pressure reading 102/58.   She also wanted to make Dr. Shann Darnel aware that she does still take clopidogrel  (PLAVIX ) 75 MG tablet and accidentally said no at her appointment because she was not familiar with it being called Plavix .   Chief Complaint: Left Foot Pain and Swelling Symptoms: pain and swelling Frequency: Over a week prior to last appointment Pertinent Negatives: Patient denies chest pain, abdominal pain, nausea, vomiting, diarrhea, leg pain, rash, fever, numbness Disposition: [] ED /[] Urgent Care (no appt availability in office) / [] Appointment(In office/virtual)/ []  [facility]/ [] Home Care/ [x] Refused Recommended Disposition /[] [facility]/ []  Follow-up with PCP Additional Notes: Patient called and advised that her left foot is still swollen around her toes and across her foot. Patient was concerned because she has diabetes. Patient states that she does take Clopidogrel  (Plavix ) 75 mg every day and a low dose Aspirin  every day. Patient states that gout was mentioned yesterday.  Patient was calling to see if the results were back for her foot X-ray. This RN did not see any results noted by the provider to let her know.  Patient also states that when she was at the office her blood pressure was low 102/58.  Patient is worried about this because she was in the hospital for a bad urinary tract infection--she had a low blood pressure at that time. Patient states that they normally check her blood pressure manually and they didn't do this at her appointment two days. Patient states that she is not too concerned about her blood pressure but Doctor Fishers cuff is usually used and that  reads more accurately.  She denies urinary symptoms that she is aware of at this time. Patient states she took her blood sugar the past few days and it was in the 90s.  Patient started talking about a pair of shoes that she had.  She states that she noticed swelling when she put them on at some point. She states that it didn't matter when she got the shoes.  Patient states that the only thing she is worried about is her foot.  She states that gout was mentioned and she knows they will know what to do with it.  Patient states that she doesn't have transportation.   Patient is advised that if anything gets worse to go to the Emergency Room or call 911 for an ambulance to take her to the Emergency Room.  This RN called the CAL to express patient's multiple concerns. This RN advised the patient that the Xray results weren't back and read yet. She is advised when the results are back and read, someone would be in contact with her. Patient is also advised as per CAL her blood pressure is slightly lower than it has been.  If she has any  Patient states she is worried about her foot and being diabetic. Patient is advised that if she is worried or concerned---she can go to an Urgent Care or to the Emergency Room if anything gets worse. Patient states to let the Xrays be read first prior to making any appointments. Patient also did not want to have any prescribed medications.  She states that she has Tylenol . Patient is advised  multiple times that if anything worsens or she has any other concerns to either call us  back, call 911 for an ambulance to take her to the Emergency Room, or go to an Urgent Care or go to the Emergency Room. Patient verbalized understanding and she states she will wait for a call back from the Office.      Reason for Disposition  [1] SEVERE pain (e.g., excruciating, unable to do any normal activities) AND [2] not improved after 2 hours of pain medicine  Answer Assessment  - Initial Assessment Questions 1. ONSET: "When did the pain start?"      Over a week past the last appointment 2. LOCATION: "Where is the pain located?"      Left foot 3. PAIN: "How bad is the pain?"    (Scale 1-10; or mild, moderate, severe)  - MILD (1-3): doesn't interfere with normal activities.   - MODERATE (4-7): interferes with normal activities (e.g., work or school) or awakens from sleep, limping.   - SEVERE (8-10): excruciating pain, unable to do any normal activities, unable to walk.      8 4. WORK OR EXERCISE: "Has there been any recent work or exercise that involved this part of the body?"      Just walking on it 5. CAUSE: "What do you think is causing the foot pain?"     unknown 6. OTHER SYMPTOMS: "Do you have any other symptoms?" (e.g., leg pain, rash, fever, numbness)     Patient denies---- she states just some swelling  Protocols used: Foot Pain-A-AH
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
## 2023-06-28 ENCOUNTER — Other Ambulatory Visit: Payer: Self-pay | Admitting: Cardiology

## 2023-06-28 DIAGNOSIS — I2510 Atherosclerotic heart disease of native coronary artery without angina pectoris: Secondary | ICD-10-CM

## 2023-06-28 DIAGNOSIS — I700 Atherosclerosis of aorta: Secondary | ICD-10-CM | POA: Diagnosis not present

## 2023-06-28 DIAGNOSIS — I2584 Coronary atherosclerosis due to calcified coronary lesion: Secondary | ICD-10-CM | POA: Diagnosis not present

## 2023-06-28 DIAGNOSIS — E119 Type 2 diabetes mellitus without complications: Secondary | ICD-10-CM | POA: Diagnosis not present

## 2023-06-28 DIAGNOSIS — R0602 Shortness of breath: Secondary | ICD-10-CM | POA: Diagnosis not present

## 2023-06-28 DIAGNOSIS — R0609 Other forms of dyspnea: Secondary | ICD-10-CM | POA: Diagnosis not present

## 2023-06-28 DIAGNOSIS — Z955 Presence of coronary angioplasty implant and graft: Secondary | ICD-10-CM | POA: Diagnosis not present

## 2023-06-28 DIAGNOSIS — E7800 Pure hypercholesterolemia, unspecified: Secondary | ICD-10-CM | POA: Diagnosis not present

## 2023-06-28 DIAGNOSIS — I10 Essential (primary) hypertension: Secondary | ICD-10-CM | POA: Diagnosis not present

## 2023-06-29 ENCOUNTER — Encounter (HOSPITAL_COMMUNITY): Payer: Self-pay

## 2023-07-06 ENCOUNTER — Ambulatory Visit

## 2023-07-06 ENCOUNTER — Ambulatory Visit (INDEPENDENT_AMBULATORY_CARE_PROVIDER_SITE_OTHER)

## 2023-07-06 DIAGNOSIS — Z0000 Encounter for general adult medical examination without abnormal findings: Secondary | ICD-10-CM

## 2023-07-06 DIAGNOSIS — Z780 Asymptomatic menopausal state: Secondary | ICD-10-CM | POA: Diagnosis not present

## 2023-07-06 DIAGNOSIS — E538 Deficiency of other specified B group vitamins: Secondary | ICD-10-CM

## 2023-07-06 MED ORDER — CYANOCOBALAMIN 1000 MCG/ML IJ SOLN
1000.0000 ug | Freq: Once | INTRAMUSCULAR | Status: AC
  Administered 2023-07-06: 1000 ug via INTRAMUSCULAR

## 2023-07-06 NOTE — Patient Instructions (Signed)
 April Pittman , Thank you for taking time out of your busy schedule to complete your Annual Wellness Visit with me. I enjoyed our conversation and look forward to speaking with you again next year. I, as well as your care team,  appreciate your ongoing commitment to your health goals. Please review the following plan we discussed and let me know if I can assist you in the future.  You have an order for:  []   2D Mammogram  []   3D Mammogram  [x]   Bone Density     Please call for appointment:  [facility]  [address] [place] [address] [phone] [facility] [address] [place], [place] [address] [phone] [facility] [address] [place], [place] [address] [phone]   Make sure to wear two-piece clothing.  No lotions, powders, or deodorants the day of the appointment. Make sure to bring picture ID and insurance card.  Bring list of medications you are currently taking including any supplements.   Schedule your [place] screening mammogram through MyChart!   Log into your MyChart account.  Go to 'Visit' (or 'Appointments' if on mobile App) --> Schedule an Appointment  Under 'Select a Reason for Visit' choose the Mammogram Screening option.  Complete the pre-visit questions and select the time and place that best fits your schedule.    Follow up Visits: Next Medicare AWV with our clinical staff:   07/17/24 @ 8:50 AM BY PHONE Have you seen your provider in the last 6 months (3 months if uncontrolled diabetes)? Yes   Clinician Recommendations:  Aim for 30 minutes of exercise or brisk walking, 6-8 glasses of water, and 5 servings of fruits and vegetables each day. TAKE CARE!      This is a list of the screening recommended for you and due dates:  Health Maintenance  Topic Date Due   DTaP/Tdap/Td vaccine (1 - Tdap) Never done   Zoster  (Shingles) Vaccine (1 of 2) 12/06/1959   DEXA scan (bone density measurement)  12/13/2020   COVID-19 Vaccine (5 - 2024-25 season) 10/24/2022   Colon Cancer Screening  05/12/2023   Flu Shot  09/23/2023   Hemoglobin A1C  10/06/2023   Yearly kidney health urinalysis for diabetes  10/07/2023   Eye exam for diabetics  12/07/2023   Yearly kidney function blood test for diabetes  04/07/2024   Medicare Annual Wellness Visit  07/05/2024   Pneumonia Vaccine  Completed   HPV Vaccine  Aged Out   Meningitis B Vaccine  Aged Out    Advanced directives: (ACP Link)Information on Advanced Care Planning can be found at [place]  Best boy Advance Health Care Directives Advance Health Care Directives. [email or web address]  Advance Care Planning is important because it:  [x]  Makes sure you receive the medical care that is consistent with your values, goals, and preferences  [x]  It provides guidance to your family and loved ones and reduces their decisional burden about whether or not they are making the right decisions based on your wishes.  Follow the link provided in your after visit summary or read over the paperwork we have mailed to you to help you started getting your Advance Directives in place. If you need assistance in completing these, please reach out to us  so that we can help you!

## 2023-07-06 NOTE — Progress Notes (Signed)
 Patient here for B12 injection

## 2023-07-06 NOTE — Progress Notes (Signed)
 Subjective:   April Pittman is a 83 y.o. who presents for a Medicare Wellness preventive visit.  As a reminder, Annual Wellness Visits don't include a physical exam, and some assessments may be limited, especially if this visit is performed virtually. We may recommend an in-person visit if needed.  Visit Complete: Virtual I connected with  April Pittman on 07/06/23 by a audio enabled telemedicine application and verified that I am speaking with the correct person using two identifiers.  Patient Location: Home  Provider Location: Home Office  I discussed the limitations of evaluation and management by telemedicine. The patient expressed understanding and agreed to proceed.  Vital Signs: Because this visit was a virtual/telehealth visit, some criteria may be missing or patient reported. Any vitals not documented were not able to be obtained and vitals that have been documented are patient reported.  VideoDeclined- This patient declined Librarian, academic. Therefore the visit was completed with audio only.  Persons Participating in Visit: Patient.  AWV Questionnaire: No: Patient Medicare AWV questionnaire was not completed prior to this visit.  Cardiac Risk Factors include: advanced age (>60men, >60 women);hypertension;diabetes mellitus;dyslipidemia;sedentary lifestyle     Objective:     There were no vitals filed for this visit. There is no height or weight on file to calculate BMI.     07/06/2023    9:48 AM 05/12/2022    2:15 PM 10/02/2021   10:39 AM 04/29/2021    2:32 PM 06/07/2020    4:10 PM 04/23/2020    2:36 PM 06/08/2018   10:46 PM  Advanced Directives  Does Patient Have a Medical Advance Directive? No Yes No No No No Yes  Type of Furniture conservator/restorer;Living will       Would patient like information on creating a medical advance directive? No - Patient declined  No - Patient declined No - Patient declined  No -  Patient declined     Current Medications (verified) Outpatient Encounter Medications as of 07/06/2023  Medication Sig   amLODipine  (NORVASC ) 5 MG tablet Take 1 tablet (5 mg total) by mouth daily.   aspirin  EC 81 MG tablet Take 81 mg by mouth daily.   Blood Glucose Monitoring Suppl (ONE TOUCH ULTRA 2) w/Device KIT Use to check blood sugar daily for type 2 diabetes E11.9   calcium  carbonate (OS-CAL) 1250 (500 Ca) MG chewable tablet Chew 1 tablet by mouth daily.   clopidogrel  (PLAVIX ) 75 MG tablet TAKE 1 TABLET BY MOUTH EVERY DAY   glipiZIDE  (GLUCOTROL  XL) 2.5 MG 24 hr tablet TAKE 1 TABLET BY MOUTH DAILY WITH BREAKFAST.   levothyroxine  (SYNTHROID ) 112 MCG tablet Take 1 tablet (112 mcg total) by mouth daily.   lisinopril -hydrochlorothiazide  (ZESTORETIC ) 20-25 MG tablet Take 1 tablet by mouth daily.   metFORMIN  (GLUCOPHAGE ) 1000 MG tablet TAKE 1 TABLET (1,000 MG TOTAL) BY MOUTH 2 (TWO) TIMES DAILY. TAKE WITH FOOD   metoprolol  tartrate (LOPRESSOR ) 50 MG tablet Take 1 tablet (50 mg total) by mouth 2 (two) times daily.   ONETOUCH ULTRA test strip USE TO CHECK BLOOD SUGAR DAILY. E 11.9 (TYPE 2 DIABETES MELLITIS)   OXYQUINOLONE SULFATE VAGINAL (TRIMO-SAN) 0.025 % GEL INSERT 1/3 APPLICATORFUL VAGINALLY EVERY OTHER DAY AS DIRECTED   simvastatin  (ZOCOR ) 40 MG tablet TAKE 1 TABLET BY MOUTH EVERY DAY FOR CHOLESTEROL   vitamin B-12 (CYANOCOBALAMIN ) 1000 MCG tablet Take 1,000 mcg by mouth daily.   cholecalciferol (VITAMIN D ) 25 MCG (1000 UNIT) tablet  Take 5,000 Units by mouth daily. (Patient not taking: Reported on 07/06/2023)   No facility-administered encounter medications on file as of 07/06/2023.    Allergies (verified) Patient has no known allergies.   History: Past Medical History:  Diagnosis Date   Alopecia    Benign neoplasm of large bowel    CAD (coronary artery disease)    Carpal tunnel syndrome    Complication of anesthesia    vomits with any pain medicine   Erosion of cervix    H/O  adenomatous polyp of colon    with sever dysplasia, removed 2000 by colonoscopy   History of chicken pox    Malignant melanoma of foot (HCC)    PONV (postoperative nausea and vomiting)    Post-menopausal bleeding    Procidentia of uterus    with traction cystocele, managed with gellhorn pessary   Rectal bleeding    Sepsis (HCC) 02/18/2018   TIA (transient ischemic attack)    Past Surgical History:  Procedure Laterality Date   BREAST EXCISIONAL BIOPSY Right    Benign   CARDIAC CATHETERIZATION  10/2010   +stenting to LAD and RCA; symptoms: chest pain, elevated Troponin, +AMI. [place]   CATARACT EXTRACTION W/PHACO Right 07/28/2016   Procedure: CATARACT EXTRACTION PHACO AND INTRAOCULAR LENS PLACEMENT (IOC)  Right Diabetic;  Surgeon: Annell Kidney, MD;  Location: [facility];  Service: Ophthalmology;  Laterality: Right;  Diabetic leave patient at 9 arrival   CATARACT EXTRACTION W/PHACO Left 10/27/2016   Procedure: CATARACT EXTRACTION PHACO AND INTRAOCULAR LENS PLACEMENT (IOC) LEFT DIABETIC;  Surgeon: Annell Kidney, MD;  Location: [facility];  Service: Ophthalmology;  Laterality: Left;   COLONOSCOPY  03/2008   2 polyps, internal and external hemorrhoids, +anal growth. Dr. Felicita Horns   COLONOSCOPY WITH PROPOFOL  N/A 05/12/2018   Procedure: COLONOSCOPY WITH PROPOFOL ;  Surgeon: Cassie Click, MD;  Location: [facility] END[facility];  Service: Endoscopy;  Laterality: N/A;   ESOPHAGOGASTRODUODENOSCOPY (EGD) WITH PROPOFOL  N/A 05/12/2018   Procedure: ESOPHAGOGASTRODUODENOSCOPY (EGD) WITH PROPOFOL ;  Surgeon: Cassie Click, MD;  Location: [facility] END[facility]COPY;  Service: Endoscopy;  Laterality: N/A;   KNEE SURGERY     TONSILLECTOMY     as a child   Family History  Problem Relation Age of Onset   Lung cancer Brother    Diabetes Father    Heart disease Sister        Valvular rheumatic heart disease   Rheumatic fever Sister    Heart disease Paternal Grandmother    Breast  cancer Neg Hx    Ovarian cancer Neg Hx    Colon cancer Neg Hx    Social History   Socioeconomic History   Marital status: Married    Spouse name: Not on file   Number of children: 3   Years of education: Not on file   Highest education level: GED or equivalent  Occupational History   Occupation: Retired    Comment: former Scientist, product/process development  Tobacco Use   Smoking status: Former    Current packs/day: 0.00    Types: Cigarettes    Quit date: 03/03/1988    Years since quitting: 35.3   Smokeless tobacco: Never  Vaping Use   Vaping status: Never Used  Substance and Sexual Activity   Alcohol use: No    Alcohol/week: 0.0 standard drinks of alcohol   Drug use: No   Sexual activity: Not Currently    Birth control/protection: Post-menopausal  Other Topics Concern   Not on file  Social History  Narrative   Not on file   Social Drivers of Health   Financial Resource Strain: Low Risk  (07/06/2023)   Overall Financial Resource Strain (CARDIA)    Difficulty of Paying Living Expenses: Not hard at all  Food Insecurity: No Food Insecurity (07/06/2023)   Hunger Vital Sign    Worried About Running Out of Food in the Last Year: Never true    Ran Out of Food in the Last Year: Never true  Transportation Needs: No Transportation Needs (07/06/2023)   PRAPARE - Administrator, Civil Service (Medical): No    Lack of Transportation (Non-Medical): No  Physical Activity: Inactive (07/06/2023)   Exercise Vital Sign    Days of Exercise per Week: 0 days    Minutes of Exercise per Session: 0 min  Stress: No Stress Concern Present (07/06/2023)   [employer] of Occupational Health - Occupational Stress Questionnaire    Feeling of Stress : Only a little  Social Connections: Moderately Isolated (07/06/2023)   Social Connection and Isolation Panel [NHANES]    Frequency of Communication with Friends and Family: More than three times a week    Frequency of Social Gatherings with Friends and  Family: Twice a week    Attends Religious Services: More than 4 times per year    Active Member of [employer] or Organizations: No    Attends Banker Meetings: Never    Marital Status: Widowed    Tobacco Counseling Counseling given: Not Answered    Clinical Intake:  Pre-visit preparation completed: Yes  Pain : No/denies pain     BMI - recorded: 24.8 Nutritional Status: BMI of 19-24  Normal Nutritional Risks: None Diabetes: Yes CBG done?: No Did pt. bring in CBG monitor from home?: No  Lab Results  Component Value Date   HGBA1C 7.6 (H) 04/08/2023   HGBA1C 6.9 09/16/2022   HGBA1C 7.2 (A) 06/07/2022     How often do you need to have someone help you when you read instructions, pamphlets, or other written materials from your doctor or pharmacy?: 1 - Never  Interpreter Needed?: No  Information entered by :: Dellie Fergusson, LPN   Activities of Daily Living    07/06/2023    9:51 AM 09/29/2022   10:17 AM  In your present state of health, do you have any difficulty performing the following activities:  Hearing? 0 1  Vision? 1 0  Difficulty concentrating or making decisions? 0 1  Walking or climbing stairs? 1 0  Dressing or bathing? 0 1  Doing errands, shopping? 0 1  Preparing Food and eating ? N   Using the Toilet? N   In the past six months, have you accidently leaked urine? N   Do you have problems with loss of bowel control? N   Managing your Medications? N   Managing your Finances? N   Housekeeping or managing your Housekeeping? N     Patient Care Team: Lamon Pillow, MD as PCP - General (Family Medicine) Cara Chancellor Wiliam Harder, MD as Consulting Physician (Cardiology) Alben Alma, MD as Referring Physician (Obstetrics and Gynecology) Arty Binning, MD as Referring Physician (Ophthalmology) Annell Kidney, MD as Referring Physician (Ophthalmology)  Indicate any recent Medical Services you may have received from other than [facility] providers in the  past year (date may be approximate).     Assessment:    This is a routine wellness examination for [place].  Hearing/Vision screen Hearing Screening - Comments:: NO AIDS Vision Screening -  Comments:: WEARS GLASSES- [facility]   Goals Addressed             This Visit's Progress    Cut out extra servings         Depression Screen     07/06/2023    9:45 AM 04/08/2023    1:17 PM 09/29/2022   10:17 AM 05/12/2022    2:13 PM 04/29/2021    2:31 PM 04/23/2020    2:26 PM 01/15/2020   11:17 AM  PHQ 2/9 Scores  PHQ - 2 Score 0 0 0 1 1 1  0  PHQ- 9 Score 0  3        Fall Risk     07/06/2023    9:50 AM 04/08/2023    1:16 PM 09/29/2022   10:16 AM 05/12/2022    2:08 PM 04/29/2021    2:34 PM  Fall Risk   Falls in the past year? 0 0 0 0 1  Number falls in past yr: 0 0 0 0 0  Injury with Fall? 0 0 0 0 0  Risk for fall due to : No Fall Risks Impaired balance/gait;No Fall Risks  History of fall(s);Impaired balance/gait;Impaired vision History of fall(s)  Follow up Falls prevention discussed;Falls evaluation completed   Falls prevention discussed;Education provided Falls prevention discussed    MEDICARE RISK AT HOME:  Medicare Risk at Home Any stairs in or around the home?: Yes If so, are there any without handrails?: No Home free of loose throw rugs in walkways, pet beds, electrical cords, etc?: Yes Adequate lighting in your home to reduce risk of falls?: Yes Life alert?: No Use of a cane, walker or w/c?: Yes (CANE ALL THE TIME) Grab bars in the bathroom?: Yes Shower chair or bench in shower?: Yes Elevated toilet seat or a handicapped toilet?: Yes  TIMED UP AND GO:  Was the test performed?  No  Cognitive Function: 6CIT completed        07/06/2023    9:53 AM 05/12/2022    2:19 PM 11/18/2017   10:03 AM  6CIT Screen  What Year? 0 points 0 points 0 points  What month? 0 points 0 points 0 points  What time? 0 points 0 points 0 points  Count back from 20 0 points 0 points 0 points   Months in reverse 0 points 0 points 2 points  Repeat phrase 0 points 0 points 0 points  Total Score 0 points 0 points 2 points    Immunizations Immunization History  Administered Date(s) Administered   Fluad Quad(high Dose 65+) 11/27/2018, 01/15/2020, 02/10/2021, 02/05/2022   Fluad Trivalent(High Dose 65+) 04/08/2023   Influenza, High Dose Seasonal PF 01/29/2015, 12/31/2015, 01/11/2017, 12/15/2017   Moderna Sars-Covid-2 Vaccination 02/10/2020   PFIZER(Purple Top)SARS-COV-2 Vaccination 03/08/2019, 03/29/2019   Pfizer Covid-19 Vaccine Bivalent Booster 39yrs & up 03/06/2021   Pneumococcal Conjugate-13 11/07/2013   Pneumococcal Polysaccharide-23 12/07/2010   Zoster, Live 09/15/2015    Screening Tests Health Maintenance  Topic Date Due   DTaP/Tdap/Td (1 - Tdap) Never done   Zoster Vaccines- Shingrix  (1 of 2) 12/06/1959   DEXA SCAN  12/13/2020   COVID-19 Vaccine (5 - 2024-25 season) 10/24/2022   Colonoscopy  05/12/2023   INFLUENZA VACCINE  09/23/2023   HEMOGLOBIN A1C  10/06/2023   Diabetic kidney evaluation - Urine ACR  10/07/2023   OPHTHALMOLOGY EXAM  12/07/2023   Diabetic kidney evaluation - eGFR measurement  04/07/2024   Medicare Annual Wellness (AWV)  07/05/2024   Pneumonia Vaccine  67+ Years old  Completed   HPV VACCINES  Aged Out   Meningococcal B Vaccine  Aged Out    Health Maintenance  Health Maintenance Due  Topic Date Due   DTaP/Tdap/Td (1 - Tdap) Never done   Zoster Vaccines- Shingrix  (1 of 2) 12/06/1959   DEXA SCAN  12/13/2020   COVID-19 Vaccine (5 - 2024-25 season) 10/24/2022   Colonoscopy  05/12/2023   Health Maintenance Items Addressed: NEEDS PNA, COVID, TDAP; AGED OUT OF MAMMOGRAM, COLONOSCOPY;   Additional Screening:  Vision Screening: Recommended annual ophthalmology exams for early detection of glaucoma and other disorders of the eye.  Dental Screening: Recommended annual dental exams for proper oral hygiene  Community Resource Referral / Chronic  Care Management: CRR required this visit?  No   CCM required this visit?  No   Plan:    I have personally reviewed and noted the following in the patient's chart:   Medical and social history Use of alcohol, tobacco or illicit drugs  Current medications and supplements including opioid prescriptions. Patient is not currently taking opioid prescriptions. Functional ability and status Nutritional status Physical activity Advanced directives List of other physicians Hospitalizations, surgeries, and ER visits in previous 12 months Vitals Screenings to include cognitive, depression, and falls Referrals and appointments  In addition, I have reviewed and discussed with patient certain preventive protocols, quality metrics, and best practice recommendations. A written personalized care plan for preventive services as well as general preventive health recommendations were provided to patient.   Pinky Bright, LPN   05/31/8117   After Visit Summary: (MyChart) Due to this being a telephonic visit, the after visit summary with patients personalized plan was offered to patient via MyChart   Notes: DEXA ORDERED

## 2023-07-08 ENCOUNTER — Ambulatory Visit: Payer: PPO | Admitting: Family Medicine

## 2023-07-08 ENCOUNTER — Encounter: Payer: Self-pay | Admitting: Family Medicine

## 2023-07-08 VITALS — BP 136/59 | HR 75 | Temp 98.3°F | Resp 16 | Wt 139.8 lb

## 2023-07-08 DIAGNOSIS — E538 Deficiency of other specified B group vitamins: Secondary | ICD-10-CM

## 2023-07-08 DIAGNOSIS — E1165 Type 2 diabetes mellitus with hyperglycemia: Secondary | ICD-10-CM

## 2023-07-08 DIAGNOSIS — M79672 Pain in left foot: Secondary | ICD-10-CM | POA: Diagnosis not present

## 2023-07-08 DIAGNOSIS — E559 Vitamin D deficiency, unspecified: Secondary | ICD-10-CM

## 2023-07-08 DIAGNOSIS — E039 Hypothyroidism, unspecified: Secondary | ICD-10-CM

## 2023-07-08 NOTE — Progress Notes (Signed)
 Established patient visit   Patient: April Pittman   DOB: January 16, 1941   83 y.o. Female  MRN: [id number] Visit Date: 07/08/2023  Today's healthcare provider: Jeralene Mom, MD   Chief Complaint  Patient presents with   Follow-up    Foot swelling   Subjective    HPI  Follow up 4/29 o.v. for swelling of left. Xray showed moderate OA. She states swelling  is much better. Also reduced levothyroxine  to 112 at that time. Labs on that date showed very low B12 and low D3. Has started weekly b12 injection, and will change to monthly after 4th dose. Has not yet started vitamin D  that was recommended.  Lab Results  Component Value Date   TSH 0.218 (L) 04/08/2023    Lab Results  Component Value Date   VITAMINB12 115 (L) 06/21/2023   Lab Results  Component Value Date   VD25OH 26.1 (L) 04/08/2023   Lab Results  Component Value Date   HGBA1C 7.6 (H) 04/08/2023      Medications: Outpatient Medications Prior to Visit  Medication Sig   amLODipine  (NORVASC ) 5 MG tablet Take 1 tablet (5 mg total) by mouth daily.   aspirin  EC 81 MG tablet Take 81 mg by mouth daily.   Blood Glucose Monitoring Suppl (ONE TOUCH ULTRA 2) w/Device KIT Use to check blood sugar daily for type 2 diabetes E11.9   calcium  carbonate (OS-CAL) 1250 (500 Ca) MG chewable tablet Chew 1 tablet by mouth daily.   cholecalciferol (VITAMIN D ) 25 MCG (1000 UNIT) tablet Take 5,000 Units by mouth daily. (Patient not taking: Reported on 07/06/2023)   clopidogrel  (PLAVIX ) 75 MG tablet TAKE 1 TABLET BY MOUTH EVERY DAY   glipiZIDE  (GLUCOTROL  XL) 2.5 MG 24 hr tablet TAKE 1 TABLET BY MOUTH DAILY WITH BREAKFAST.   levothyroxine  (SYNTHROID ) 112 MCG tablet Take 1 tablet (112 mcg total) by mouth daily.   lisinopril -hydrochlorothiazide  (ZESTORETIC ) 20-25 MG tablet Take 1 tablet by mouth daily.   metFORMIN  (GLUCOPHAGE ) 1000 MG tablet TAKE 1 TABLET (1,000 MG TOTAL) BY MOUTH 2 (TWO) TIMES DAILY. TAKE WITH FOOD   metoprolol  tartrate  (LOPRESSOR ) 50 MG tablet Take 1 tablet (50 mg total) by mouth 2 (two) times daily.   ONETOUCH ULTRA test strip USE TO CHECK BLOOD SUGAR DAILY. E 11.9 (TYPE 2 DIABETES MELLITIS)   OXYQUINOLONE SULFATE VAGINAL (TRIMO-SAN) 0.025 % GEL INSERT 1/3 APPLICATORFUL VAGINALLY EVERY OTHER DAY AS DIRECTED   simvastatin  (ZOCOR ) 40 MG tablet TAKE 1 TABLET BY MOUTH EVERY DAY FOR CHOLESTEROL   vitamin B-12 (CYANOCOBALAMIN ) 1000 MCG tablet Take 1,000 mcg by mouth daily.   No facility-administered medications prior to visit.    Review of Systems     Objective    BP (!) 136/59 (BP Location: Left Arm, Patient Position: Sitting, Cuff Size: Normal)   Pulse 75   Temp 98.3 F (36.8 C)   Resp 16   Wt 139 lb 12.8 oz (63.4 kg)   SpO2 100%   BMI 24.76 kg/m    Physical Exam   General: Appearance:    Well developed, well nourished female in no acute distress  Eyes:    PERRL, conjunctiva/corneas clear, EOM's intact       Lungs:     Clear to auscultation bilaterally, respirations unlabored  Heart:    Normal heart rate. Normal rhythm. No murmurs, rubs, or gallops.    MS:   All extremities are intact.    Neurologic:   Awake, alert, oriented  x 3. No apparent focal neurological defect.         Assessment & Plan     1. Left foot pain (Primary) Some oa on xrays last month, but is doing much better now with swelling mostly resolved.   2. Vitamin D  deficiency Has not yet started d supplement, but is planning on it son.   3. B12 deficiency On weekly b12 x4, then will change to monthly.   4. Type 2 diabetes mellitus with hyperglycemia, without long-term current use of insulin  (HCC) Check A1c with lab draw in June  5. Hypothyroidism, unspecified type Recently reduced to 112 mcg levothyroxine . Check TSH first week of June.          Jeralene Mom, MD  [facility] [phone] (phone) [phone] (fax)  [facility]

## 2023-07-13 ENCOUNTER — Ambulatory Visit (INDEPENDENT_AMBULATORY_CARE_PROVIDER_SITE_OTHER)

## 2023-07-13 DIAGNOSIS — E538 Deficiency of other specified B group vitamins: Secondary | ICD-10-CM

## 2023-07-13 MED ORDER — CYANOCOBALAMIN 1000 MCG/ML IJ SOLN
1000.0000 ug | Freq: Once | INTRAMUSCULAR | Status: AC
  Administered 2023-07-13: 1000 ug via INTRAMUSCULAR

## 2023-07-13 NOTE — Progress Notes (Signed)
 Patient presented for # 2 of 4 weekly B12 injections

## 2023-07-14 DIAGNOSIS — I2510 Atherosclerotic heart disease of native coronary artery without angina pectoris: Secondary | ICD-10-CM | POA: Diagnosis not present

## 2023-07-14 DIAGNOSIS — I2584 Coronary atherosclerosis due to calcified coronary lesion: Secondary | ICD-10-CM | POA: Diagnosis not present

## 2023-07-20 ENCOUNTER — Telehealth: Payer: Self-pay

## 2023-07-20 ENCOUNTER — Ambulatory Visit (INDEPENDENT_AMBULATORY_CARE_PROVIDER_SITE_OTHER)

## 2023-07-20 DIAGNOSIS — E538 Deficiency of other specified B group vitamins: Secondary | ICD-10-CM

## 2023-07-20 MED ORDER — CYANOCOBALAMIN 1000 MCG/ML IJ SOLN
1000.0000 ug | Freq: Once | INTRAMUSCULAR | Status: AC
  Administered 2023-07-20: 1000 ug via INTRAMUSCULAR

## 2023-07-20 NOTE — Progress Notes (Signed)
 Patient received her weekly 3rd B-12 injection. She is to return in a week for her fourth injection.

## 2023-07-20 NOTE — Telephone Encounter (Signed)
 No. But she needs to schedule a follow up appoint in the third or fourth week of August and will check levels at that time.

## 2023-07-20 NOTE — Telephone Encounter (Signed)
 Patient was given her weekly 3rd injection, patient was asking is she needed to have her B-12 blood work done before her 4th injection in order to proceed with her monthly injections. If yes, please let me know and I can call patient.

## 2023-07-21 NOTE — Telephone Encounter (Signed)
 No answer when called, will attempt at a later time today.

## 2023-07-21 NOTE — Telephone Encounter (Signed)
 Spoke with patient, patient stated she asked about the wrong test. She meant to have her thyroid  check not her B-12, I advised there is pending labs for her TSH and Hemoglobin A1c. Patient understand, stated she comes back next week for her B-12 injection and would do blood work before getting her shot.

## 2023-07-26 ENCOUNTER — Encounter
Admission: RE | Admit: 2023-07-26 | Discharge: 2023-07-26 | Disposition: A | Discharge status: Home / Self Care | Source: Ambulatory Visit | Attending: Cardiology | Admitting: Cardiology

## 2023-07-26 DIAGNOSIS — I2510 Atherosclerotic heart disease of native coronary artery without angina pectoris: Secondary | ICD-10-CM | POA: Diagnosis not present

## 2023-07-26 DIAGNOSIS — I2584 Coronary atherosclerosis due to calcified coronary lesion: Secondary | ICD-10-CM | POA: Diagnosis not present

## 2023-07-26 DIAGNOSIS — Z955 Presence of coronary angioplasty implant and graft: Secondary | ICD-10-CM | POA: Insufficient documentation

## 2023-07-26 DIAGNOSIS — R0609 Other forms of dyspnea: Secondary | ICD-10-CM | POA: Diagnosis not present

## 2023-07-26 DIAGNOSIS — R0602 Shortness of breath: Secondary | ICD-10-CM | POA: Diagnosis not present

## 2023-07-26 LAB — NM MYOCAR MULTI W/SPECT W/WALL MOTION / EF
Estimated workload: 1
Exercise duration (min): 1 min
Exercise duration (sec): 0 s
LV dias vol: 54 mL (ref 46–106)
LV sys vol: 12 mL
MPHR: 138 {beats}/min
Nuc Stress EF: 78 %
Peak HR: 90 {beats}/min
Percent HR: 65 %
Rest HR: 63 {beats}/min
Rest Nuclear Isotope Dose: 10.9 mCi
SDS: 5
SRS: 0
SSS: 0
ST Depression (mm): 0 mm
Stress Nuclear Isotope Dose: 32.3 mCi
TID: 0.76

## 2023-07-26 MED ORDER — TECHNETIUM TC 99M TETROFOSMIN IV KIT
10.0000 | PACK | Freq: Once | INTRAVENOUS | Status: AC | PRN
  Administered 2023-07-26: 10.91 via INTRAVENOUS

## 2023-07-26 MED ORDER — REGADENOSON 0.4 MG/5ML IV SOLN
0.4000 mg | Freq: Once | INTRAVENOUS | Status: AC
  Administered 2023-07-26: 0.4 mg via INTRAVENOUS

## 2023-07-26 MED ORDER — TECHNETIUM TC 99M TETROFOSMIN IV KIT
30.0000 | PACK | Freq: Once | INTRAVENOUS | Status: AC | PRN
  Administered 2023-07-26: 32.25 via INTRAVENOUS

## 2023-07-27 ENCOUNTER — Other Ambulatory Visit: Payer: Self-pay | Admitting: Family Medicine

## 2023-07-27 ENCOUNTER — Ambulatory Visit

## 2023-07-27 DIAGNOSIS — E1165 Type 2 diabetes mellitus with hyperglycemia: Secondary | ICD-10-CM

## 2023-07-27 DIAGNOSIS — E538 Deficiency of other specified B group vitamins: Secondary | ICD-10-CM

## 2023-07-27 DIAGNOSIS — Z7689 Persons encountering health services in other specified circumstances: Secondary | ICD-10-CM | POA: Diagnosis not present

## 2023-07-27 DIAGNOSIS — E039 Hypothyroidism, unspecified: Secondary | ICD-10-CM

## 2023-07-27 MED ORDER — CYANOCOBALAMIN 1000 MCG/ML IJ SOLN
1000.0000 ug | Freq: Once | INTRAMUSCULAR | Status: AC
  Administered 2023-07-27: 1000 ug via INTRAMUSCULAR

## 2023-07-28 ENCOUNTER — Ambulatory Visit: Payer: Self-pay | Admitting: Family Medicine

## 2023-07-28 DIAGNOSIS — I10 Essential (primary) hypertension: Secondary | ICD-10-CM | POA: Diagnosis not present

## 2023-07-28 DIAGNOSIS — E7800 Pure hypercholesterolemia, unspecified: Secondary | ICD-10-CM | POA: Diagnosis not present

## 2023-07-28 DIAGNOSIS — Z955 Presence of coronary angioplasty implant and graft: Secondary | ICD-10-CM | POA: Diagnosis not present

## 2023-07-28 DIAGNOSIS — I2510 Atherosclerotic heart disease of native coronary artery without angina pectoris: Secondary | ICD-10-CM | POA: Diagnosis not present

## 2023-07-28 DIAGNOSIS — E119 Type 2 diabetes mellitus without complications: Secondary | ICD-10-CM | POA: Diagnosis not present

## 2023-07-28 LAB — TSH: TSH: 0.756 u[IU]/mL (ref 0.450–4.500)

## 2023-07-28 LAB — HEMOGLOBIN A1C
Est. average glucose Bld gHb Est-mCnc: 163 mg/dL
Hgb A1c MFr Bld: 7.3 % — ABNORMAL HIGH (ref 4.8–5.6)

## 2023-08-16 DIAGNOSIS — H353221 Exudative age-related macular degeneration, left eye, with active choroidal neovascularization: Secondary | ICD-10-CM | POA: Diagnosis not present

## 2023-08-16 DIAGNOSIS — H02403 Unspecified ptosis of bilateral eyelids: Secondary | ICD-10-CM | POA: Diagnosis not present

## 2023-08-16 DIAGNOSIS — E11311 Type 2 diabetes mellitus with unspecified diabetic retinopathy with macular edema: Secondary | ICD-10-CM | POA: Diagnosis not present

## 2023-08-16 DIAGNOSIS — H353211 Exudative age-related macular degeneration, right eye, with active choroidal neovascularization: Secondary | ICD-10-CM | POA: Diagnosis not present

## 2023-08-24 ENCOUNTER — Ambulatory Visit (INDEPENDENT_AMBULATORY_CARE_PROVIDER_SITE_OTHER): Admitting: Family Medicine

## 2023-08-24 VITALS — BP 104/60 | HR 62 | Ht 63.0 in | Wt 141.4 lb

## 2023-08-24 DIAGNOSIS — E559 Vitamin D deficiency, unspecified: Secondary | ICD-10-CM

## 2023-08-24 DIAGNOSIS — E538 Deficiency of other specified B group vitamins: Secondary | ICD-10-CM

## 2023-08-24 MED ORDER — CYANOCOBALAMIN 1000 MCG/ML IJ SOLN: 1000.0000 ug | Freq: Once | INTRAMUSCULAR | Status: DC

## 2023-08-24 NOTE — Progress Notes (Signed)
 Established patient visit   Patient: April Pittman   DOB: 12-31-1940   83 y.o. Female  MRN: [id number] Visit Date: 08/24/2023  Today's healthcare provider: Nancyann Perry, MD   Chief Complaint  Patient presents with   Medical Management of Chronic Issues   VItamin B-12 Defiency     Patient levels were first checked on 06/21/23 and she has since then had 4 injections since then   Subjective    HPI Follow up since starting B12 injections in May. Had weekly injections for a month, then planned on change to monthly. Last injection was 6-4. Feels a bit more energetic. No side effects from injections.   Lab Results  Component Value Date   VITAMINB12 115 (L) 06/21/2023   Last vitamin D  Lab Results  Component Value Date   VD25OH 26.1 (L) 04/08/2023     Medications: Outpatient Medications Prior to Visit  Medication Sig   amLODipine  (NORVASC ) 5 MG tablet Take 1 tablet (5 mg total) by mouth daily.   aspirin  EC 81 MG tablet Take 81 mg by mouth daily.   Blood Glucose Monitoring Suppl (ONE TOUCH ULTRA 2) w/Device KIT Use to check blood sugar daily for type 2 diabetes E11.9   calcium  carbonate (OS-CAL) 1250 (500 Ca) MG chewable tablet Chew 1 tablet by mouth daily.   cholecalciferol (VITAMIN D ) 25 MCG (1000 UNIT) tablet Take 5,000 Units by mouth daily. (Patient not taking: Reported on 07/06/2023)   clopidogrel  (PLAVIX ) 75 MG tablet TAKE 1 TABLET BY MOUTH EVERY DAY   glipiZIDE  (GLUCOTROL  XL) 2.5 MG 24 hr tablet TAKE 1 TABLET BY MOUTH DAILY WITH BREAKFAST.   levothyroxine  (SYNTHROID ) 112 MCG tablet Take 1 tablet (112 mcg total) by mouth daily.   lisinopril -hydrochlorothiazide  (ZESTORETIC ) 20-25 MG tablet Take 1 tablet by mouth daily.   metFORMIN  (GLUCOPHAGE ) 1000 MG tablet TAKE 1 TABLET (1,000 MG TOTAL) BY MOUTH 2 (TWO) TIMES DAILY. TAKE WITH FOOD   metoprolol  tartrate (LOPRESSOR ) 50 MG tablet Take 1 tablet (50 mg total) by mouth 2 (two) times daily.   ONETOUCH ULTRA test strip USE  TO CHECK BLOOD SUGAR DAILY. E 11.9 (TYPE 2 DIABETES MELLITIS)   OXYQUINOLONE SULFATE VAGINAL (TRIMO-SAN) 0.025 % GEL INSERT 1/3 APPLICATORFUL VAGINALLY EVERY OTHER DAY AS DIRECTED   simvastatin  (ZOCOR ) 40 MG tablet TAKE 1 TABLET BY MOUTH EVERY DAY FOR CHOLESTEROL   No facility-administered medications prior to visit.    Review of Systems     Objective    BP 104/60 (BP Location: Left Arm, Patient Position: Sitting, Cuff Size: Normal)   Pulse 62   Ht 5' 3 (1.6 m)   Wt 141 lb 6.4 oz (64.1 kg)   SpO2 98%   BMI 25.05 kg/m    Physical Exam   General appearance: Well developed, well nourished female, cooperative and in no acute distress Head: Normocephalic, without obvious abnormality, atraumatic Respiratory: Respirations even and unlabored, normal respiratory rate Extremities: All extremities are intact.  Skin: Skin color, texture, turgor normal. No rashes seen  Psych: Appropriate mood and affect. Neurologic: Mental status: Alert, oriented to person, place, and time, thought content appropriate.    Assessment & Plan     1. B12 deficiency (Primary) Completed b12 1000 I'm qweek x 4. Dose given last on 6-4. Dose given today.   - Vitamin B12  She prefers to change to oral b12 if levels are up.   2. Vitamin D  deficiency  - VITAMIN D  25 Hydroxy (Vit-D Deficiency, Fractures)  Nancyann Perry, MD  [facility] [phone] (phone) [phone] (fax)  [facility]

## 2023-08-25 ENCOUNTER — Ambulatory Visit: Payer: Self-pay | Admitting: Family Medicine

## 2023-08-25 LAB — VITAMIN B12: Vitamin B-12: >2000 pg/mL — ABNORMAL HIGH (ref 232–1245)

## 2023-08-25 LAB — VITAMIN D 25 HYDROXY (VIT D DEFICIENCY, FRACTURES): Vit D, 25-Hydroxy: 26.7 ng/mL — ABNORMAL LOW (ref 30.0–100.0)

## 2023-09-15 ENCOUNTER — Encounter: Payer: Self-pay | Admitting: Obstetrics and Gynecology

## 2023-09-15 ENCOUNTER — Ambulatory Visit: Admitting: Obstetrics and Gynecology

## 2023-09-15 VITALS — BP 153/66 | HR 61 | Ht 63.0 in | Wt 139.8 lb

## 2023-09-15 DIAGNOSIS — Z4689 Encounter for fitting and adjustment of other specified devices: Secondary | ICD-10-CM | POA: Diagnosis not present

## 2023-09-15 DIAGNOSIS — N814 Uterovaginal prolapse, unspecified: Secondary | ICD-10-CM

## 2023-09-15 NOTE — Progress Notes (Signed)
 HPI:      [name] is a 83 y.o. [phone] who LMP was No LMP recorded. Patient is postmenopausal.  Subjective:   She presents today for pessary care.  She is not having any problems.  She denies vaginal bleeding.  She has not been using the Trimo-San cream because she cannot find it at her [facility].  We have advised her on other pharmacies where it may be available.    Hx: The following portions of the patient's history were reviewed and updated as appropriate:             She  has a past medical history of Alopecia, Benign neoplasm of large bowel, CAD (coronary artery disease), Carpal tunnel syndrome, Complication of anesthesia, Erosion of cervix, H/O adenomatous polyp of colon, History of chicken pox, Malignant melanoma of foot (HCC), PONV (postoperative nausea and vomiting), Post-menopausal bleeding, Procidentia of uterus, Rectal bleeding, Sepsis (HCC) (02/18/2018), and TIA (transient ischemic attack). She does not have any pertinent problems on file. She  has a past surgical history that includes Tonsillectomy; Knee surgery; Cardiac catheterization (10/2010); Colonoscopy (03/2008); Breast excisional biopsy (Right); Cataract extraction w/PHACO (Right, 07/28/2016); Cataract extraction w/PHACO (Left, 10/27/2016); Esophagogastroduodenoscopy (egd) with propofol  (N/A, 05/12/2018); and Colonoscopy with propofol  (N/A, 05/12/2018). Her family history includes Diabetes in her father; Heart disease in her paternal grandmother and sister; Lung cancer in her brother; Rheumatic fever in her sister. She  reports that she quit smoking about 35 years ago. Her smoking use included cigarettes. She has never used smokeless tobacco. She reports that she does not drink alcohol and does not use drugs. She has a current medication list which includes the following prescription(s): amlodipine , aspirin  ec, one touch ultra 2, calcium  carbonate, cholecalciferol, clopidogrel , glipizide , levothyroxine ,  lisinopril -hydrochlorothiazide , metformin , metoprolol  tartrate, onetouch ultra, trimo-san, simvastatin , and cyanocobalamin , and the following Facility-Administered Medications: cyanocobalamin . She has no known allergies.       Review of Systems:  Review of Systems  Constitutional: Denied constitutional symptoms, night sweats, recent illness, fatigue, fever, insomnia and weight loss.  Eyes: Denied eye symptoms, eye pain, photophobia, vision change and visual disturbance.  Ears/Nose/Throat/Neck: Denied ear, nose, throat or neck symptoms, hearing loss, nasal discharge, sinus congestion and sore throat.  Cardiovascular: Denied cardiovascular symptoms, arrhythmia, chest pain/pressure, edema, exercise intolerance, orthopnea and palpitations.  Respiratory: Denied pulmonary symptoms, asthma, pleuritic pain, productive sputum, cough, dyspnea and wheezing.  Gastrointestinal: Denied, gastro-esophageal reflux, melena, nausea and vomiting.  Genitourinary: Denied genitourinary symptoms including symptomatic vaginal discharge, pelvic relaxation issues, and urinary complaints.  Musculoskeletal: Denied musculoskeletal symptoms, stiffness, swelling, muscle weakness and myalgia.  Dermatologic: Denied dermatology symptoms, rash and scar.  Neurologic: Denied neurology symptoms, dizziness, headache, neck pain and syncope.  Psychiatric: Denied psychiatric symptoms, anxiety and depression.  Endocrine: Denied endocrine symptoms including hot flashes and night sweats.   Meds:   Current Outpatient Medications on File Prior to Visit  Medication Sig Dispense Refill   amLODipine  (NORVASC ) 5 MG tablet Take 1 tablet (5 mg total) by mouth daily. 90 tablet 3   aspirin  EC 81 MG tablet Take 81 mg by mouth daily.     Blood Glucose Monitoring Suppl (ONE TOUCH ULTRA 2) w/Device KIT Use to check blood sugar daily for type 2 diabetes E11.9 1 kit 0   calcium  carbonate (OS-CAL) 1250 (500 Ca) MG chewable tablet Chew 1 tablet by mouth  daily.     cholecalciferol (VITAMIN D ) 25 MCG (1000 UNIT) tablet Take 5,000 Units by mouth daily. (  Patient not taking: Reported on 07/06/2023)     clopidogrel  (PLAVIX ) 75 MG tablet TAKE 1 TABLET BY MOUTH EVERY DAY 90 tablet 0   glipiZIDE  (GLUCOTROL  XL) 2.5 MG 24 hr tablet TAKE 1 TABLET BY MOUTH DAILY WITH BREAKFAST. 90 tablet 1   levothyroxine  (SYNTHROID ) 112 MCG tablet Take 1 tablet (112 mcg total) by mouth daily. 90 tablet 1   lisinopril -hydrochlorothiazide  (ZESTORETIC ) 20-25 MG tablet Take 1 tablet by mouth daily. 90 tablet 0   metFORMIN  (GLUCOPHAGE ) 1000 MG tablet TAKE 1 TABLET (1,000 MG TOTAL) BY MOUTH 2 (TWO) TIMES DAILY. TAKE WITH FOOD 180 tablet 4   metoprolol  tartrate (LOPRESSOR ) 50 MG tablet Take 1 tablet (50 mg total) by mouth 2 (two) times daily. 60 tablet 12   ONETOUCH ULTRA test strip USE TO CHECK BLOOD SUGAR DAILY. E 11.9 (TYPE 2 DIABETES MELLITIS) 100 strip 4   OXYQUINOLONE SULFATE VAGINAL (TRIMO-SAN) 0.025 % GEL INSERT 1/3 APPLICATORFUL VAGINALLY EVERY OTHER DAY AS DIRECTED 113 g 2   simvastatin  (ZOCOR ) 40 MG tablet TAKE 1 TABLET BY MOUTH EVERY DAY FOR CHOLESTEROL 90 tablet 4   vitamin B-12 (CYANOCOBALAMIN ) 1000 MCG tablet Take 1,000 mcg by mouth daily.     Current Facility-Administered Medications on File Prior to Visit  Medication Dose Route Frequency Provider Last Rate Last Admin   cyanocobalamin  (VITAMIN B12) injection 1,000 mcg  1,000 mcg Intramuscular Once           Objective:     Vitals:   09/15/23 1031  BP: (!) 153/66  Pulse: 61   Filed Weights   09/15/23 1031  Weight: 139 lb 12.8 oz (63.4 kg)              Pessary Care Pessary removed and cleaned.  Vagina checked by speculum exam- without erosions - pessary replaced.           Assessment:    [id number] Patient Active Problem List   Diagnosis Date Noted   Hemorrhoids 07/21/2021   Cystocele with prolapse 02/04/2020   PAC (premature atrial contraction) 05/11/2019   Short PR-normal QRS complex syndrome  05/11/2019   Right bundle branch block 05/11/2019   Aortic atherosclerosis (HCC) 01/05/2019   Thrombocytosis 04/14/2018   Borderline blood pressure 04/14/2018   Hiatal hernia 03/24/2018   Cholelithiases 03/24/2018   Iron  deficiency anemia 03/04/2018   Cataract cortical, senile, bilateral 05/11/2017   Primary osteoarthritis of right knee 11/02/2015   Vitamin D  deficiency 08/14/2014   Rectal/anal hemorrhage 06/14/2014   Hypothyroidism 06/14/2014   Hypertension 06/14/2014   Hypercholesteremia 06/14/2014   TIA (transient ischemic attack) 06/14/2014   Gastroesophageal reflux disease 06/14/2014   Alopecia 06/14/2014   Malignant melanoma of foot (HCC) 06/14/2014   Irritable bowel 06/14/2014   Coronary artery disease 06/14/2014   Type 2 diabetes mellitus with hyperglycemia, without long-term current use of insulin  (HCC) 06/14/2014   Carpal tunnel syndrome 06/14/2014   Procidentia of uterus 06/14/2014   Osteoporosis 06/14/2014   History of adenomatous polyp of colon 06/14/2014   B12 deficiency 08/08/2008   Colon, diverticulosis 05/14/2008     1. Encounter for pessary maintenance   2. Cystocele with prolapse     Patient doing very well with pessary.  No issues   Plan:            1.  Follow-up in 5 months for pessary care. Orders No orders of the defined types were placed in this encounter.   No orders of the defined types were placed in this  encounter.     F/U  Return in about 5 months (around 02/15/2024).  Alm DOROTHA Sar, M.D. 09/15/2023 11:25 AM

## 2023-09-15 NOTE — Progress Notes (Signed)
 Patient presents for pessary maintenance. She states doing well but has not been able to start the Trimo-san ointment yet. Patient states no other questions or concerns at this time.

## 2023-10-04 ENCOUNTER — Encounter: Payer: Self-pay | Admitting: *Deleted

## 2023-10-04 ENCOUNTER — Other Ambulatory Visit: Payer: Self-pay

## 2023-10-04 DIAGNOSIS — K5732 Diverticulitis of large intestine without perforation or abscess without bleeding: Secondary | ICD-10-CM | POA: Diagnosis not present

## 2023-10-04 DIAGNOSIS — E119 Type 2 diabetes mellitus without complications: Secondary | ICD-10-CM | POA: Diagnosis not present

## 2023-10-04 DIAGNOSIS — Z955 Presence of coronary angioplasty implant and graft: Secondary | ICD-10-CM | POA: Diagnosis not present

## 2023-10-04 DIAGNOSIS — E039 Hypothyroidism, unspecified: Secondary | ICD-10-CM | POA: Insufficient documentation

## 2023-10-04 DIAGNOSIS — R1084 Generalized abdominal pain: Secondary | ICD-10-CM | POA: Diagnosis not present

## 2023-10-04 DIAGNOSIS — I2510 Atherosclerotic heart disease of native coronary artery without angina pectoris: Secondary | ICD-10-CM | POA: Diagnosis not present

## 2023-10-04 DIAGNOSIS — I10 Essential (primary) hypertension: Secondary | ICD-10-CM | POA: Insufficient documentation

## 2023-10-04 DIAGNOSIS — M545 Low back pain, unspecified: Secondary | ICD-10-CM | POA: Diagnosis not present

## 2023-10-04 LAB — CBC
HCT: 33.6 % — ABNORMAL LOW (ref 36.0–46.0)
Hemoglobin: 10.7 g/dL — ABNORMAL LOW (ref 12.0–15.0)
MCH: 25.2 pg — ABNORMAL LOW (ref 26.0–34.0)
MCHC: 31.8 g/dL (ref 30.0–36.0)
MCV: 79.1 fL — ABNORMAL LOW (ref 80.0–100.0)
Platelets: 419 K/uL — ABNORMAL HIGH (ref 150–400)
RBC: 4.25 MIL/uL (ref 3.87–5.11)
RDW: 14.3 % (ref 11.5–15.5)
WBC: 14 K/uL — ABNORMAL HIGH (ref 4.0–10.5)
nRBC: 0 % (ref 0.0–0.2)

## 2023-10-04 LAB — URINALYSIS, ROUTINE W REFLEX MICROSCOPIC
Bilirubin Urine: NEGATIVE
Glucose, UA: 50 mg/dL — AB
Hgb urine dipstick: NEGATIVE
Ketones, ur: NEGATIVE mg/dL
Leukocytes,Ua: NEGATIVE
Nitrite: NEGATIVE
Protein, ur: NEGATIVE mg/dL
Specific Gravity, Urine: 1.005 (ref 1.005–1.030)
pH: 7 (ref 5.0–8.0)

## 2023-10-04 LAB — COMPREHENSIVE METABOLIC PANEL WITH GFR
ALT: 11 U/L (ref 0–44)
AST: 19 U/L (ref 15–41)
Albumin: 3.5 g/dL (ref 3.5–5.0)
Alkaline Phosphatase: 69 U/L (ref 38–126)
Anion gap: 11 (ref 5–15)
BUN: 8 mg/dL (ref 8–23)
CO2: 27 mmol/L (ref 22–32)
Calcium: 9 mg/dL (ref 8.9–10.3)
Chloride: 98 mmol/L (ref 98–111)
Creatinine, Ser: 0.6 mg/dL (ref 0.44–1.00)
GFR, Estimated: >60 mL/min (ref >60)
Glucose, Bld: 150 mg/dL — ABNORMAL HIGH (ref 70–99)
Potassium: 4.3 mmol/L (ref 3.5–5.1)
Sodium: 136 mmol/L (ref 135–145)
Total Bilirubin: 0.4 mg/dL (ref 0.0–1.2)
Total Protein: 7.3 g/dL (ref 6.5–8.1)

## 2023-10-04 NOTE — ED Triage Notes (Signed)
 Pt to triage via wheelchair.  Pt has lower back pain.  Pt has nausea.  Pt denies urinary sx.  No injury to back  sx for 4 days.   Pt alert  speech clear. Hx uti's

## 2023-10-05 ENCOUNTER — Observation Stay
Admission: EM | Admit: 2023-10-05 | Discharge: 2023-10-06 | Disposition: A | Discharge status: Home / Self Care | Attending: Student | Admitting: Student

## 2023-10-05 ENCOUNTER — Emergency Department

## 2023-10-05 DIAGNOSIS — R1084 Generalized abdominal pain: Secondary | ICD-10-CM

## 2023-10-05 DIAGNOSIS — I700 Atherosclerosis of aorta: Secondary | ICD-10-CM | POA: Diagnosis not present

## 2023-10-05 DIAGNOSIS — K5730 Diverticulosis of large intestine without perforation or abscess without bleeding: Secondary | ICD-10-CM | POA: Diagnosis not present

## 2023-10-05 DIAGNOSIS — K5732 Diverticulitis of large intestine without perforation or abscess without bleeding: Secondary | ICD-10-CM | POA: Diagnosis not present

## 2023-10-05 DIAGNOSIS — M545 Low back pain, unspecified: Principal | ICD-10-CM

## 2023-10-05 DIAGNOSIS — K449 Diaphragmatic hernia without obstruction or gangrene: Secondary | ICD-10-CM | POA: Diagnosis not present

## 2023-10-05 DIAGNOSIS — R509 Fever, unspecified: Secondary | ICD-10-CM | POA: Diagnosis not present

## 2023-10-05 DIAGNOSIS — E119 Type 2 diabetes mellitus without complications: Secondary | ICD-10-CM | POA: Diagnosis not present

## 2023-10-05 DIAGNOSIS — K429 Umbilical hernia without obstruction or gangrene: Secondary | ICD-10-CM | POA: Diagnosis not present

## 2023-10-05 DIAGNOSIS — Z862 Personal history of diseases of the blood and blood-forming organs and certain disorders involving the immune mechanism: Secondary | ICD-10-CM | POA: Diagnosis not present

## 2023-10-05 LAB — GLUCOSE, CAPILLARY
Glucose-Capillary: 83 mg/dL (ref 70–99)
Glucose-Capillary: 213 mg/dL — ABNORMAL HIGH (ref 70–99)
Glucose-Capillary: 103 mg/dL — ABNORMAL HIGH (ref 70–99)

## 2023-10-05 LAB — MAGNESIUM: Magnesium: 1.2 mg/dL — ABNORMAL LOW (ref 1.7–2.4)

## 2023-10-05 LAB — LACTIC ACID, PLASMA
Lactic Acid, Venous: 1.5 mmol/L (ref 0.5–1.9)
Lactic Acid, Venous: 3.1 mmol/L (ref 0.5–1.9)
Lactic Acid, Venous: 1.1 mmol/L (ref 0.5–1.9)
Lactic Acid, Venous: 1.6 mmol/L (ref 0.5–1.9)

## 2023-10-05 LAB — PHOSPHORUS: Phosphorus: 2.8 mg/dL (ref 2.5–4.6)

## 2023-10-05 LAB — PROCALCITONIN: Procalcitonin: <0.1 ng/mL

## 2023-10-05 MED ORDER — IOHEXOL 300 MG/ML  SOLN
100.0000 mL | Freq: Once | INTRAMUSCULAR | Status: AC | PRN
  Administered 2023-10-05 (×2): 100 mL via INTRAVENOUS

## 2023-10-05 MED ORDER — SODIUM CHLORIDE 0.9 % IV BOLUS
1000.0000 mL | Freq: Once | INTRAVENOUS | Status: AC
  Administered 2023-10-05 (×2): 1000 mL via INTRAVENOUS

## 2023-10-05 MED ORDER — SIMVASTATIN 20 MG PO TABS: 40.0000 mg | ORAL_TABLET | Freq: Every day | ORAL | Status: DC

## 2023-10-05 MED ORDER — SODIUM CHLORIDE 0.9 % IV SOLN: INTRAVENOUS | Status: AC

## 2023-10-05 MED ORDER — OXYCODONE HCL 5 MG PO TABS
5.0000 mg | ORAL_TABLET | ORAL | Status: DC | PRN
  Administered 2023-10-05 (×2): 5 mg via ORAL
  Filled 2023-10-05: qty 1

## 2023-10-05 MED ORDER — ONDANSETRON HCL 4 MG/2ML IJ SOLN: 4.0000 mg | Freq: Four times a day (QID) | INTRAMUSCULAR | Status: DC | PRN

## 2023-10-05 MED ORDER — SODIUM CHLORIDE 0.9% FLUSH: 3.0000 mL | Freq: Two times a day (BID) | INTRAVENOUS | Status: DC

## 2023-10-05 MED ORDER — SODIUM CHLORIDE 0.9 % IV SOLN
2.0000 g | INTRAVENOUS | Status: DC
  Administered 2023-10-06: 2 g via INTRAVENOUS
  Filled 2023-10-05: qty 20

## 2023-10-05 MED ORDER — ONDANSETRON HCL 4 MG/2ML IJ SOLN
4.0000 mg | Freq: Once | INTRAMUSCULAR | Status: AC
  Administered 2023-10-05 (×2): 4 mg via INTRAVENOUS
  Filled 2023-10-05: qty 2

## 2023-10-05 MED ORDER — SODIUM CHLORIDE 0.9% FLUSH
3.0000 mL | Freq: Two times a day (BID) | INTRAVENOUS | Status: DC
  Administered 2023-10-05 (×4): 3 mL via INTRAVENOUS

## 2023-10-05 MED ORDER — LACTATED RINGERS IV BOLUS
1000.0000 mL | Freq: Once | INTRAVENOUS | Status: AC
  Administered 2023-10-05 (×2): 1000 mL via INTRAVENOUS

## 2023-10-05 MED ORDER — METOPROLOL TARTRATE 50 MG PO TABS
50.0000 mg | ORAL_TABLET | Freq: Two times a day (BID) | ORAL | Status: DC
  Administered 2023-10-05 – 2023-10-06 (×5): 50 mg via ORAL
  Filled 2023-10-05 (×3): qty 1

## 2023-10-05 MED ORDER — SODIUM CHLORIDE 0.9% FLUSH: 3.0000 mL | INTRAVENOUS | Status: DC | PRN

## 2023-10-05 MED ORDER — SODIUM CHLORIDE 0.9 % IV SOLN: 250.0000 mL | INTRAVENOUS | Status: AC | PRN

## 2023-10-05 MED ORDER — INSULIN ASPART 100 UNIT/ML IJ SOLN
0.0000 [IU] | Freq: Three times a day (TID) | INTRAMUSCULAR | Status: DC
  Administered 2023-10-05 (×2): 5 [IU] via SUBCUTANEOUS
  Administered 2023-10-06: 3 [IU] via SUBCUTANEOUS
  Filled 2023-10-05 (×2): qty 1

## 2023-10-05 MED ORDER — ONDANSETRON HCL 4 MG PO TABS: 4.0000 mg | ORAL_TABLET | Freq: Four times a day (QID) | ORAL | Status: DC | PRN

## 2023-10-05 MED ORDER — ACETAMINOPHEN 325 MG PO TABS: 650.0000 mg | ORAL_TABLET | Freq: Four times a day (QID) | ORAL | Status: DC | PRN

## 2023-10-05 MED ORDER — METRONIDAZOLE 500 MG/100ML IV SOLN
500.0000 mg | Freq: Two times a day (BID) | INTRAVENOUS | Status: DC
  Administered 2023-10-05 – 2023-10-06 (×3): 500 mg via INTRAVENOUS
  Filled 2023-10-05 (×3): qty 100

## 2023-10-05 MED ORDER — ACETAMINOPHEN 650 MG RE SUPP: 650.0000 mg | Freq: Four times a day (QID) | RECTAL | Status: DC | PRN

## 2023-10-05 MED ORDER — ASPIRIN 81 MG PO TBEC
81.0000 mg | DELAYED_RELEASE_TABLET | Freq: Every day | ORAL | Status: DC
  Administered 2023-10-05 – 2023-10-06 (×3): 81 mg via ORAL
  Filled 2023-10-05 (×2): qty 1

## 2023-10-05 MED ORDER — MAGNESIUM SULFATE 2 GM/50ML IV SOLN
2.0000 g | Freq: Once | INTRAVENOUS | Status: AC
  Administered 2023-10-05 (×2): 2 g via INTRAVENOUS
  Filled 2023-10-05: qty 50

## 2023-10-05 MED ORDER — LEVOTHYROXINE SODIUM 112 MCG PO TABS
112.0000 ug | ORAL_TABLET | Freq: Every day | ORAL | Status: DC
  Administered 2023-10-06: 112 ug via ORAL
  Filled 2023-10-05: qty 1

## 2023-10-05 MED ORDER — MORPHINE SULFATE (PF) 2 MG/ML IV SOLN: 2.0000 mg | INTRAVENOUS | Status: DC | PRN

## 2023-10-05 MED ORDER — METRONIDAZOLE 500 MG/100ML IV SOLN
500.0000 mg | Freq: Once | INTRAVENOUS | Status: AC
  Administered 2023-10-05 (×2): 500 mg via INTRAVENOUS
  Filled 2023-10-05: qty 100

## 2023-10-05 MED ORDER — SODIUM CHLORIDE 0.9 % IV SOLN
2.0000 g | Freq: Once | INTRAVENOUS | Status: AC
  Administered 2023-10-05 (×2): 2 g via INTRAVENOUS
  Filled 2023-10-05: qty 20

## 2023-10-05 MED ORDER — ENOXAPARIN SODIUM 40 MG/0.4ML IJ SOSY
40.0000 mg | PREFILLED_SYRINGE | INTRAMUSCULAR | Status: DC
  Administered 2023-10-05 (×2): 40 mg via SUBCUTANEOUS
  Filled 2023-10-05: qty 0.4

## 2023-10-05 MED ORDER — CLOPIDOGREL BISULFATE 75 MG PO TABS
75.0000 mg | ORAL_TABLET | Freq: Every day | ORAL | Status: DC
  Administered 2023-10-05 – 2023-10-06 (×3): 75 mg via ORAL
  Filled 2023-10-05 (×2): qty 1

## 2023-10-05 MED ORDER — MORPHINE SULFATE (PF) 2 MG/ML IV SOLN
2.0000 mg | Freq: Once | INTRAVENOUS | Status: AC
  Administered 2023-10-05 (×2): 2 mg via INTRAVENOUS
  Filled 2023-10-05: qty 1

## 2023-10-05 NOTE — H&P (Addendum)
 [facility] History and Physical   Patient: April Pittman FMW:[id number]   PCP: Gasper Nancyann BRAVO, MD DOB: 11-Mar-1940   DOA: 10/05/2023   DOS: 10/05/2023   DOS: the patient was seen and examined on 10/05/2023  Patient coming from: The patient is coming from Home  Chief Complaint: Left lower back pain  HPI: April Pittman is a 83 y.o. female with Past medical history of NIDDM, HTN, TIA, CAD s/p RCA stent, hypothyroid, melanoma foot, reviewed as per EMR, presented to [facility] ED with complaining of left lower sided back pain for past few days.  Denied any fever or chills.    ED Course: Febrile Tmax 100.4.  Rest of the vital signs within normal range  CBC: Leukocytosis WBC 14.0 BMP glucose 150 Hypomagnesemia, mag 1.2 Lactic acid 3.1 Procalcitonin negative CT A/P: Sigmoid diverticulitis, no any other significant findings  Review of Systems: as mentioned in the history of present illness.  All other systems reviewed and are negative.  Past Medical History:  Diagnosis Date   Alopecia    Benign neoplasm of large bowel    CAD (coronary artery disease)    Carpal tunnel syndrome    Complication of anesthesia    vomits with any pain medicine   Erosion of cervix    H/O adenomatous polyp of colon    with sever dysplasia, removed 2000 by colonoscopy   History of chicken pox    Malignant melanoma of foot (HCC)    PONV (postoperative nausea and vomiting)    Post-menopausal bleeding    Procidentia of uterus    with traction cystocele, managed with gellhorn pessary   Rectal bleeding    Sepsis (HCC) 02/18/2018   TIA (transient ischemic attack)    Past Surgical History:  Procedure Laterality Date   BREAST EXCISIONAL BIOPSY Right    Benign   CARDIAC CATHETERIZATION  10/2010   +stenting to LAD and RCA; symptoms: chest pain, elevated Troponin, +AMI. [place]   CATARACT EXTRACTION W/PHACO Right 07/28/2016   Procedure: CATARACT EXTRACTION PHACO AND INTRAOCULAR LENS PLACEMENT (IOC)   Right Diabetic;  Surgeon: Mittie Gaskin, MD;  Location: [facility];  Service: Ophthalmology;  Laterality: Right;  Diabetic leave patient at 9 arrival   CATARACT EXTRACTION W/PHACO Left 10/27/2016   Procedure: CATARACT EXTRACTION PHACO AND INTRAOCULAR LENS PLACEMENT (IOC) LEFT DIABETIC;  Surgeon: Mittie Gaskin, MD;  Location: [facility];  Service: Ophthalmology;  Laterality: Left;   COLONOSCOPY  03/2008   2 polyps, internal and external hemorrhoids, +anal growth. Dr. Viktoria   COLONOSCOPY WITH PROPOFOL  N/A 05/12/2018   Procedure: COLONOSCOPY WITH PROPOFOL ;  Surgeon: Viktoria Lamar DASEN, MD;  Location: [facility] ENDOSCOPY;  Service: Endoscopy;  Laterality: N/A;   ESOPHAGOGASTRODUODENOSCOPY (EGD) WITH PROPOFOL  N/A 05/12/2018   Procedure: ESOPHAGOGASTRODUODENOSCOPY (EGD) WITH PROPOFOL ;  Surgeon: Viktoria Lamar DASEN, MD;  Location: [facility] ENDOS[facility];  Service: Endoscopy;  Laterality: N/A;   KNEE SURGERY     TONSILLECTOMY     as a child   Social History:  reports that she quit smoking about 35 years ago. Her smoking use included cigarettes. She has never used smokeless tobacco. She reports that she does not drink alcohol and does not use drugs.  No Known Allergies   Family history reviewed and not pertinent Family History  Problem Relation Age of Onset   Lung cancer Brother    Diabetes Father    Heart disease Sister        Valvular rheumatic heart disease   Rheumatic  fever Sister    Heart disease Paternal Grandmother    Breast cancer Neg Hx    Ovarian cancer Neg Hx    Colon cancer Neg Hx      Prior to Admission medications   Medication Sig Start Date End Date Taking? Authorizing Provider  amLODipine  (NORVASC ) 5 MG tablet Take 1 tablet (5 mg total) by mouth daily. 01/06/23   Gasper Nancyann BRAVO, MD  aspirin  EC 81 MG tablet Take 81 mg by mouth daily.    [provider]  Blood Glucose Monitoring Suppl (ONE TOUCH ULTRA 2) w/Device KIT Use to check blood sugar daily  for type 2 diabetes E11.9 02/18/22   Gasper Nancyann BRAVO, MD  calcium  carbonate (OS-CAL) 1250 (500 Ca) MG chewable tablet Chew 1 tablet by mouth daily.    [provider]  cholecalciferol (VITAMIN D ) 25 MCG (1000 UNIT) tablet Take 5,000 Units by mouth daily. Patient not taking: Reported on 07/06/2023    [provider]  clopidogrel  (PLAVIX ) 75 MG tablet TAKE 1 TABLET BY MOUTH EVERY DAY 12/20/21   Gasper Nancyann BRAVO, MD  glipiZIDE  (GLUCOTROL  XL) 2.5 MG 24 hr tablet TAKE 1 TABLET BY MOUTH DAILY WITH BREAKFAST. 06/23/23   Gasper Nancyann BRAVO, MD  levothyroxine  (SYNTHROID ) 112 MCG tablet Take 1 tablet (112 mcg total) by mouth daily. 06/21/23   Gasper Nancyann BRAVO, MD  lisinopril -hydrochlorothiazide  (ZESTORETIC ) 20-25 MG tablet Take 1 tablet by mouth daily. 01/05/23   Gasper Nancyann BRAVO, MD  metFORMIN  (GLUCOPHAGE ) 1000 MG tablet TAKE 1 TABLET (1,000 MG TOTAL) BY MOUTH 2 (TWO) TIMES DAILY. TAKE WITH FOOD 04/11/23   Gasper Nancyann BRAVO, MD  metoprolol  tartrate (LOPRESSOR ) 50 MG tablet Take 1 tablet (50 mg total) by mouth 2 (two) times daily. 02/10/21   Gasper Nancyann BRAVO, MD  [facility] ULTRA test strip USE TO CHECK BLOOD SUGAR DAILY. E 11.9 (TYPE 2 DIABETES MELLITIS) 04/11/23   Gasper Nancyann BRAVO, MD  OXYQUINOLONE SULFATE VAGINAL (TRIMO-SAN) 0.025 % GEL INSERT 1/3 APPLICATORFUL VAGINALLY EVERY OTHER DAY AS DIRECTED 04/12/23   Janit Alm Agent, MD  simvastatin  (ZOCOR ) 40 MG tablet TAKE 1 TABLET BY MOUTH EVERY DAY FOR CHOLESTEROL 10/09/21   Gasper Nancyann BRAVO, MD  vitamin B-12 (CYANOCOBALAMIN ) 1000 MCG tablet Take 1,000 mcg by mouth daily.    [provider]    Physical Exam: Vitals:   10/04/23 2048 10/05/23 0623 10/05/23 0630 10/05/23 0745  BP: 135/68  (!) 159/89 (!) 156/79  Pulse: 70  92 79  Resp: 18   17  Temp: (!) 100.4 F (38 C) 99 F (37.2 C)  98.7 F (37.1 C)  TempSrc: Oral Oral  Oral  SpO2: 98%  96% 97%  Weight:      Height:        General: alert and oriented to time, place, and person.  Appear in mild distress, affect appropriate Eyes: PERRLA, Conjunctiva normal ENT: Oral Mucosa Clear, moist  Neck: no JVD, no Abnormal Mass Or lumps Cardiovascular: S1 and S2 Present, no Murmur, peripheral pulses symmetrical Respiratory: good respiratory effort, Bilateral Air entry equal and Decreased, no signs of accessory muscle use, Clear to Auscultation, no Crackles, no wheezes Abdomen: BS present, Soft and Mild LLQ tenderness. Skin: no rashes  Extremities: no Pedal edema, no calf tenderness Neurologic: without any new focal findings Gait not checked due to patient safety concerns  Data Reviewed: I have personally reviewed and interpreted labs, imaging as discussed below.  CBC: Recent Labs  Lab 10/04/23 2051  WBC  14.0*  HGB 10.7*  HCT 33.6*  MCV 79.1*  PLT 419*   Basic Metabolic Panel: Recent Labs  Lab 10/04/23 2051  NA 136  K 4.3  CL 98  CO2 27  GLUCOSE 150*  BUN 8  CREATININE 0.60  CALCIUM  9.0   GFR: Estimated Creatinine Clearance: 44.8 mL/min (by C-G formula based on SCr of 0.6 mg/dL). Liver Function Tests: Recent Labs  Lab 10/04/23 2051  AST 19  ALT 11  ALKPHOS 69  BILITOT 0.4  PROT 7.3  ALBUMIN 3.5   No results for input(s): LIPASE, AMYLASE in the last 168 hours. No results for input(s): AMMONIA in the last 168 hours. Coagulation Profile: No results for input(s): INR, PROTIME in the last 168 hours. Cardiac Enzymes: No results for input(s): CKTOTAL, CKMB, CKMBINDEX, TROPONINI in the last 168 hours. BNP (last 3 results) No results for input(s): PROBNP in the last 8760 hours. HbA1C: No results for input(s): HGBA1C in the last 72 hours. CBG: No results for input(s): GLUCAP in the last 168 hours. Lipid Profile: No results for input(s): CHOL, HDL, LDLCALC, TRIG, CHOLHDL, LDLDIRECT in the last 72 hours. Thyroid  Function Tests: No results for input(s): TSH, T4TOTAL, FREET4, T3FREE, THYROIDAB in the last 72  hours. Anemia Panel: No results for input(s): VITAMINB12, FOLATE, FERRITIN, TIBC, IRON , RETICCTPCT in the last 72 hours. Urine analysis:    Component Value Date/Time   COLORURINE STRAW (A) 10/04/2023 2051   APPEARANCEUR CLEAR (A) 10/04/2023 2051   LABSPEC 1.005 10/04/2023 2051   PHURINE 7.0 10/04/2023 2051   GLUCOSEU 50 (A) 10/04/2023 2051   HGBUR NEGATIVE 10/04/2023 2051   BILIRUBINUR NEGATIVE 10/04/2023 2051   BILIRUBINUR Negative 04/08/2023 1408   KETONESUR NEGATIVE 10/04/2023 2051   PROTEINUR NEGATIVE 10/04/2023 2051   UROBILINOGEN 0.2 04/08/2023 1408   NITRITE NEGATIVE 10/04/2023 2051   LEUKOCYTESUR NEGATIVE 10/04/2023 2051    Radiological Exams on Admission: CT ABDOMEN PELVIS W CONTRAST Result Date: 10/05/2023 EXAM: CT ABDOMEN AND PELVIS WITH CONTRAST 10/05/2023 06:10:29 AM TECHNIQUE: CT of the abdomen and pelvis was performed with the administration of intravenous contrast. Multiplanar reformatted images are provided for review. Automated exposure control, iterative reconstruction, and/or weight based adjustment of the mA/kV was utilized to reduce the radiation dose to as low as reasonably achievable. COMPARISON: 06/09/2018 CLINICAL HISTORY: Sepsis, vague abdominal pain. Evaluation for diverticulitis and intra-abdominal source. Patient to triage via wheelchair. Patient has lower back pain and nausea. Patient denies urinary symptoms. No injury to back symptoms for 4 days. Patient alert, speech clear. History of urinary tract infections. FINDINGS: LOWER CHEST: No acute findings within the imaged portions of the lung bases. LIVER: The liver is unremarkable. GALLBLADDER AND BILE DUCTS: Gallbladder is unremarkable. No biliary ductal dilatation. SPLEEN: The spleen appears normal. PANCREAS: No pancreatic inflammation, main duct dilatation, or mass. ADRENAL GLANDS: Normal adrenal glands. KIDNEYS, URETERS AND BLADDER: Right renal cortical scarring and volume loss. No nephrolithiasis  or suspicious kidney mass. No signs of obstructive uropathy. Bladder appears within normal limits. GI AND BOWEL: Moderate hiatal hernia. The appendix is visualized and appears normal. Mild stool burden noted within the right colon. Distal colonic diverticula noted. Focally inflamed diverticula noted within the proximal sigmoid colon with surrounding soft tissue stranding and mild wall thickening, axial image 50/2. No signs of perforation or abscess. PERITONEUM AND RETROPERITONEUM: No free fluid or fluid collections. No signs of pneumoperitoneum. VASCULATURE: Aortic atherosclerosis. No aneurysm. LYMPH NODES: No lymphadenopathy. REPRODUCTIVE ORGANS: The uterus and adnexal structures are unremarkable. A  pessary device is in place. BONES AND SOFT TISSUES: Remote healed fracture deformities involving the left superior and inferior pubic rami. Tiny fat-containing umbilical hernia. IMPRESSION: 1. Focally inflamed diverticula in the proximal sigmoid colon with surrounding soft tissue stranding and mild wall thickening, without signs of perforation or abscess, consistent with diverticulitis. 2. Moderate hiatal hernia. Electronically signed by: Waddell Calk MD 10/05/2023 06:59 AM EDT RP Workstation: [id number]   DG Chest 2 View Result Date: 10/05/2023 EXAM: 2 VIEW(S) XRAY OF THE CHEST 10/05/2023 06:06:00 AM COMPARISON: 10/02/2021 CLINICAL HISTORY: Sepsis screen, fever, leukocytosis. History of CAD and diabetes. FINDINGS: LUNGS AND PLEURA: No focal pulmonary opacity. No pulmonary edema. No pleural effusion. No pneumothorax. Chronic linear scar-like opacity in the left base. HEART AND MEDIASTINUM: No acute abnormality of the cardiac and mediastinal silhouettes. Aortic atherosclerotic calcifications. BONES AND SOFT TISSUES: No acute osseous abnormality. IMPRESSION: 1. No acute findings. Electronically signed by: Waddell Calk MD 10/05/2023 06:53 AM EDT RP Workstation: [id number]    I reviewed all nursing notes, pharmacy  notes, vitals, pertinent old records.  Assessment/Plan Principal Problem:   Diverticulitis of sigmoid colon   # Diverticulitis of sigmoid colon Continue ceftriaxone  and metronidazole  Started full liquid diet, advance diet per tolerance Continue IV fluid for gentle hydration Follow-up blood cultures Trend WBC count   # Hypomagnesemia, mag repleted. Monitor electrolytes and replete as needed.  # CAD s/p RCA stent Continue aspirin  and Plavix   # Hypertension Blood pressure is soft Resumed metoprolol  50 mg p.o. twice daily Held other antihypertensive medications for now Monitor BP and titrate medications accordingly   # Hypothyroid: On Synthroid   # NIDDM T2 Held home medications for now Started NovoLog  sliding scale Monitor CBG and continue diabetic diet    Nutrition: Full liquid diet DVT Prophylaxis: Subcutaneous Lovenox   Advance goals of care discussion: Full code   Consults: None  Family Communication: family was present at bedside, at the time of interview.  Opportunity was given to ask question and all questions were answered satisfactorily.  Disposition: Admitted as observation, med-surge unit. Likely to be discharged home, in 1-2 days if stable.  I have discussed plan of care as described above with RN and patient/family.  Severity of Illness: The appropriate patient status for this patient is OBSERVATION. Observation status is judged to be reasonable and necessary in order to provide the required intensity of service to ensure the patient's safety. The patient's presenting symptoms, physical exam findings, and initial radiographic and laboratory data in the context of their medical condition is felt to place them at decreased risk for further clinical deterioration. Furthermore, it is anticipated that the patient will be medically stable for discharge from the hospital within 2 midnights of admission.    Author: ELVAN SOR, MD [facility] 10/05/2023  8:27 AM   To reach On-call, see care teams to locate the attending and reach out to them via [email or web address]. If 7PM-7AM, please contact night-coverage If you still have difficulty reaching the attending provider, please page the [facility] (Director on Call) for [facility] on amion for assistance.

## 2023-10-05 NOTE — ED Notes (Signed)
 This tech assisted pt to the toilet at this time.

## 2023-10-05 NOTE — ED Provider Notes (Signed)
 Care of this patient assumed from prior physician at 0700 pending completion of imaging, reevaluation, and disposition. Please see prior physician note for further details.  Briefly, this is an 83 year old female who presented with abdominal and back pain, found to be febrile on presentation here.  Labs with leukocytosis with WBC of 14, reassuring CMP, lactate, negative procalcitonin.  Urine without evidence of infection.  CXR without acute findings.  Pending CT abdomen pelvis.  CT resulted demonstrating findings of diverticulitis without perforation or abscess.  Patient reassessed and updated on results of workup.  Given her age, persistent symptoms, and fever, do think admission is reasonable.  Patient is comfortable with this plan.  She was ordered for antibiotics as well as symptomatic treatment.  Will reach out to hospitalist team.  Case discussed with hospitalist team.  They will evaluate for anticipate admission.   Levander Slate, MD 10/05/23 [phone]

## 2023-10-05 NOTE — ED Provider Notes (Signed)
 [facility] Provider Note    Event Date/Time   First MD Initiated Contact with Patient 10/05/23 [phone]     (approximate)   History   Back Pain   HPI  April Pittman is a 83 y.o. female who presents to the ED for evaluation of Back Pain   Review of PCP visit from 1 month ago.  History of DM on multiple oral agents, HTN, hypothyroidism.  Patient presents with her daughter for evaluation of intermittent severe lower back pain and abdominal pain over the past few days.  No falls or injuries.  She reports different stool.  No emesis  She did not realize that she had a fever, in triage she was 100.4.  Denies any dysuria, cough, syncope, dizziness, chest pain or shortness of breath   Physical Exam   Triage Vital Signs: ED Triage Vitals  Encounter Vitals Group     BP 10/04/23 2048 135/68     Girls Systolic BP Percentile --      Girls Diastolic BP Percentile --      Boys Systolic BP Percentile --      Boys Diastolic BP Percentile --      Pulse Rate 10/04/23 2048 70     Resp 10/04/23 2048 18     Temp 10/04/23 2048 (!) 100.4 F (38 C)     Temp Source 10/04/23 2048 Oral     SpO2 10/04/23 2048 98 %     Weight 10/04/23 2045 135 lb (61.2 kg)     Height 10/04/23 2045 5' 3 (1.6 m)     Head Circumference --      Peak Flow --      Pain Score 10/04/23 2045 7     Pain Loc --      Pain Education --      Exclude from Growth Chart --     Most recent vital signs: Vitals:   10/04/23 2048 10/05/23 0623  BP: 135/68   Pulse: 70   Resp: 18   Temp: (!) 100.4 F (38 C) 99 F (37.2 C)  SpO2: 98%     General: Awake, no distress.  CV:  Good peripheral perfusion.  Resp:  Normal effort.  Abd:  No distention.  Mild diffuse tenderness without peritoneal features MSK:  No deformity noted.  Neuro:  No focal deficits appreciated. Other:     ED Results / Procedures / Treatments   Labs (all labs ordered are listed, but only abnormal results are  displayed) Labs Reviewed  COMPREHENSIVE METABOLIC PANEL WITH GFR - Abnormal; Notable for the following components:      Result Value   Glucose, Bld 150 (*)    All other components within normal limits  CBC - Abnormal; Notable for the following components:   WBC 14.0 (*)    Hemoglobin 10.7 (*)    HCT 33.6 (*)    MCV 79.1 (*)    MCH 25.2 (*)    Platelets 419 (*)    All other components within normal limits  URINALYSIS, ROUTINE W REFLEX MICROSCOPIC - Abnormal; Notable for the following components:   Color, Urine STRAW (*)    APPearance CLEAR (*)    Glucose, UA 50 (*)    All other components within normal limits  CULTURE, BLOOD (ROUTINE X 2)  CULTURE, BLOOD (ROUTINE X 2)  PROCALCITONIN  LACTIC ACID, PLASMA  LACTIC ACID, PLASMA    EKG   RADIOLOGY   Official radiology report(s): No results found.  PROCEDURES and INTERVENTIONS:  Procedures  Medications  lactated ringers  bolus 1,000 mL (1,000 mLs Intravenous New Bag/Given 10/05/23 9378)  iohexol  (OMNIPAQUE ) 300 MG/ML solution 100 mL (100 mLs Intravenous Contrast Given 10/05/23 0603)     IMPRESSION / MDM / ASSESSMENT AND PLAN / ED COURSE  I reviewed the triage vital signs and the nursing notes.  Differential diagnosis includes, but is not limited to, viral syndrome, pyelonephritis, diverticulitis, pneumonia  {Patient presents with symptoms of an acute illness or injury that is potentially life-threatening.  Patient presents with lower abdominal and lower back discomfort in the setting of stool changes and low-grade temperature.  Leukocytosis is noted, normal lactic acid and negative procalcitonin.  Urine is clear, metabolic panel without acute pathology.  Pending imaging at the time of signout to oncoming physician.      FINAL CLINICAL IMPRESSION(S) / ED DIAGNOSES   Final diagnoses:  None     Rx / DC Orders   ED Discharge Orders     None        Note:  This document was prepared using Dragon voice  recognition software and may include unintentional dictation errors.   Claudene Rover, MD 10/05/23 [phone]

## 2023-10-05 NOTE — ED Notes (Signed)
 Date and time results received: 10/05/23 1024 (use smartphrase .now to insert current time)  Test: Lactic Critical Value: 3.1  Name of Provider Notified: Dileep

## 2023-10-05 NOTE — Care Management Obs Status (Signed)
 MEDICARE OBSERVATION STATUS NOTIFICATION   Patient Details  Name: April Pittman MRN: [id number] Date of Birth: 11/10/1940   Medicare Observation Status Notification Given:  Yes    Rojelio SHAUNNA Rattler 10/05/2023, 12:52 PM

## 2023-10-06 ENCOUNTER — Encounter: Payer: Self-pay | Admitting: Oncology

## 2023-10-06 ENCOUNTER — Other Ambulatory Visit: Payer: Self-pay

## 2023-10-06 DIAGNOSIS — K5732 Diverticulitis of large intestine without perforation or abscess without bleeding: Secondary | ICD-10-CM | POA: Diagnosis not present

## 2023-10-06 LAB — IRON AND TIBC
Iron: 17 ug/dL — ABNORMAL LOW (ref 28–170)
Saturation Ratios: 5 % — ABNORMAL LOW (ref 10.4–31.8)
TIBC: 350 ug/dL (ref 250–450)
UIBC: 333 ug/dL

## 2023-10-06 LAB — BASIC METABOLIC PANEL WITH GFR
Anion gap: 10 (ref 5–15)
BUN: 8 mg/dL (ref 8–23)
CO2: 27 mmol/L (ref 22–32)
Calcium: 8.1 mg/dL — ABNORMAL LOW (ref 8.9–10.3)
Chloride: 99 mmol/L (ref 98–111)
Creatinine, Ser: 0.61 mg/dL (ref 0.44–1.00)
GFR, Estimated: >60 mL/min (ref >60)
Glucose, Bld: 145 mg/dL — ABNORMAL HIGH (ref 70–99)
Potassium: 3.5 mmol/L (ref 3.5–5.1)
Sodium: 136 mmol/L (ref 135–145)

## 2023-10-06 LAB — GLUCOSE, CAPILLARY
Glucose-Capillary: 98 mg/dL (ref 70–99)
Glucose-Capillary: 158 mg/dL — ABNORMAL HIGH (ref 70–99)

## 2023-10-06 LAB — CBC
HCT: 29.6 % — ABNORMAL LOW (ref 36.0–46.0)
Hemoglobin: 9.4 g/dL — ABNORMAL LOW (ref 12.0–15.0)
MCH: 25.1 pg — ABNORMAL LOW (ref 26.0–34.0)
MCHC: 31.8 g/dL (ref 30.0–36.0)
MCV: 78.9 fL — ABNORMAL LOW (ref 80.0–100.0)
Platelets: 341 K/uL (ref 150–400)
RBC: 3.75 MIL/uL — ABNORMAL LOW (ref 3.87–5.11)
RDW: 14.3 % (ref 11.5–15.5)
WBC: 9.8 K/uL (ref 4.0–10.5)
nRBC: 0 % (ref 0.0–0.2)

## 2023-10-06 LAB — MAGNESIUM: Magnesium: 1.5 mg/dL — ABNORMAL LOW (ref 1.7–2.4)

## 2023-10-06 LAB — PHOSPHORUS: Phosphorus: 3.4 mg/dL (ref 2.5–4.6)

## 2023-10-06 MED ORDER — METRONIDAZOLE 500 MG PO TABS
500.0000 mg | ORAL_TABLET | Freq: Three times a day (TID) | ORAL | 0 refills | Status: AC
  Filled 2023-10-06: qty 21, 7d supply, fill #0

## 2023-10-06 MED ORDER — VITAMIN D 25 MCG (1000 UNIT) PO TABS
5000.0000 [IU] | ORAL_TABLET | Freq: Every day | ORAL | Status: DC
  Administered 2023-10-06: 5000 [IU] via ORAL
  Filled 2023-10-06: qty 5

## 2023-10-06 MED ORDER — CIPROFLOXACIN HCL 500 MG PO TABS
500.0000 mg | ORAL_TABLET | Freq: Two times a day (BID) | ORAL | 0 refills | Status: AC
  Filled 2023-10-06: qty 14, 7d supply, fill #0

## 2023-10-06 MED ORDER — MAGNESIUM SULFATE 2 GM/50ML IV SOLN
2.0000 g | Freq: Once | INTRAVENOUS | Status: AC
  Administered 2023-10-06: 2 g via INTRAVENOUS
  Filled 2023-10-06: qty 50

## 2023-10-06 NOTE — Progress Notes (Signed)
 Patient left without receiving discharge paperwork

## 2023-10-06 NOTE — Plan of Care (Signed)
  Problem: Education: Goal: Ability to describe self-care measures that may prevent or decrease complications (Diabetes Survival Skills Education) will improve Outcome: Progressing   Problem: Coping: Goal: Ability to adjust to condition or change in health will improve Outcome: Progressing   Problem: Health Behavior/Discharge Planning: Goal: Ability to manage health-related needs will improve Outcome: Progressing   Problem: Nutritional: Goal: Maintenance of adequate nutrition will improve Outcome: Progressing   Problem: Nutritional: Goal: Progress toward achieving an optimal weight will improve Outcome: Progressing   Problem: Skin Integrity: Goal: Risk for impaired skin integrity will decrease Outcome: Progressing   Problem: Education: Goal: Knowledge of General Education information will improve Description: Including pain rating scale, medication(s)/side effects and non-pharmacologic comfort measures Outcome: Progressing   Problem: Clinical Measurements: Goal: Ability to maintain clinical measurements within normal limits will improve Outcome: Progressing   Problem: Clinical Measurements: Goal: Will remain free from infection Outcome: Progressing

## 2023-10-06 NOTE — TOC CM/SW Note (Signed)
 Transition of Care [facility]) - Inpatient Brief Assessment   Patient Details  Name: April Pittman MRN: [id number] Date of Birth: 02-Mar-1940  Transition of Care [facility]) CM/SW Contact:    Asberry CHRISTELLA Jaksch, RN Phone Number: 10/06/2023, 2:59 PM   Clinical Narrative:  Transition of Care (TOC) Screening Note   Patient Details  Name: April Pittman Date of Birth: 1940/03/26   Transition of Care [facility]) CM/SW Contact:    Asberry CHRISTELLA Jaksch, RN Phone Number: 10/06/2023, 2:59 PM    Transition of Care Department [facility]) has reviewed patient and no TOC needs have been identified at this time. If new patient transition needs arise, please place a TOC consult.     Transition of Care Asessment: Insurance and Status: Insurance coverage has been reviewed Patient has primary care physician: Yes     Prior/Current Home Services: No current home services Social Drivers of Health Review: SDOH reviewed no interventions necessary Readmission risk has been reviewed: Yes Transition of care needs: no transition of care needs at this time

## 2023-10-06 NOTE — Progress Notes (Signed)
 Patient and daughter left before nurse had a chance to go over discharge paperwork. Called daughter on the phone and asked her to come back to the hospital and get paperwork due to medications and follow up appointments.

## 2023-10-06 NOTE — Plan of Care (Signed)

## 2023-10-06 NOTE — Discharge Summary (Signed)
 [facility] Discharge Summary   Patient: April Pittman FMW:[id number]  PCP: Gasper Nancyann BRAVO, MD  Date of admission: 10/05/2023   Date of discharge:  10/06/2023     Discharge Diagnoses:   Principal Problem:   Diverticulitis of sigmoid colon   Admitted From: Home Disposition: Home  Recommendations for Outpatient Follow-up:  Follow-up with PCP in 1 week, continue to monitor BP at home and follow with PCP to titrate medication accordingly.  Hold off amlodipine  if blood pressure remains low, restart if systolic BP is greater than 140 mmHg. Continue antibiotic for 7 days Follow-up with GI as an outpatient in 1 month for possible colonoscopy as an outpatient Follow up LABS/TEST:  As above   Follow-up Information     Fisher, Nancyann BRAVO, MD Follow up in 1 week(s).   Specialty: Family Medicine Contact information: [address] [place] [address] [phone]         Therisa Bi, MD Follow up in 1 month(s).   Specialty: Gastroenterology Contact information: [address] [place] [address] [phone]                Diet recommendation: low fiber soft diet  Activity: The patient is advised to gradually reintroduce usual activities, as tolerated  Discharge Condition: stable  Code Status: Full code   History of present illness: As per the H and P dictated on admission.  Hospital Course:  April Pittman is a 83 y.o. female with Past medical history of NIDDM, HTN, TIA, CAD s/p RCA stent, hypothyroid, melanoma foot, reviewed as per EMR, presented to [facility] ED with complaining of left lower sided back pain for past few days.  Denied any fever or chills.    ED Course: Febrile Tmax 100.4.  Rest of the vital signs within normal range  CBC: Leukocytosis WBC 14.0 BMP glucose 150 Hypomagnesemia, mag 1.2 Lactic acid 3.1 Procalcitonin negative CT A/P: Sigmoid diverticulitis, no any other significant  findings  Assessment/Plan Principal Problem:   Diverticulitis of sigmoid colon     # Diverticulitis of sigmoid colon S/p ceftriaxone  and metronidazole  IV given during hospital stay. S/p Full liquid diet, tolerated well and advance to soft diet.  S/p IV fluid given for hydration.  Patient's pain improved.  Denied any complaints and seems stable to discharge on oral antibiotics Cipro  and Flagyl  for 7 days.  Blood culture NGTD.  Patient was advised to follow with PCP in 1 week and follow-up with GI in few weeks for possible colonoscopy.  Patient agreed with the discharge planning.  # Hypomagnesemia, mag was repleted before discharge.    # CAD s/p RCA stent Continue aspirin  and Plavix    # Hypertension: Blood pressure is soft Resumed metoprolol  50 mg p.o. twice daily Held other antihypertensive medications during hospital stay.  BP remained stable, resumed home medications.  Patient was advised to monitor BP at home and follow with PCP to titrate medication accordingly.   # Hypothyroid: On Synthroid    # NIDDM T2: Resumed home medications on discharge.   # Iron  deficiency anemia, recommended to follow with PCP to start iron  therapy as an outpatient.  Body mass index is 23.91 kg/m.  Nutrition Interventions:  Patient was ambulatory without any assistance. On the day of the discharge the patient's vitals were stable, and no other acute medical condition were reported by patient. the patient was felt safe to be discharge at Home.  Consultants: None Procedures: None  Discharge Exam: General: Appear in  no distress, no Rash; Oral Mucosa Clear, moist. Cardiovascular: S1 and S2 Present, no Murmur, Respiratory: normal respiratory effort, Bilateral Air entry present and no Crackles, no wheezes Abdomen: Bowel Sound present, Soft and no tenderness, no hernia Extremities: no Pedal edema, no calf tenderness Neurology: alert and oriented to time, place, and person affect appropriate.  Filed  Weights   10/04/23 2045  Weight: 61.2 kg   Vitals:   10/06/23 0520 10/06/23 0802  BP:  (!) 123/56  Pulse:  73  Resp:  15  Temp:  98.4 F (36.9 C)  SpO2: 98% 91%    DISCHARGE MEDICATION: Allergies as of 10/06/2023   No Known Allergies      Medication List     PAUSE taking these medications    amLODipine  5 MG tablet Wait to take this until your doctor or other care provider tells you to start again. Commonly known as: NORVASC  Take 1 tablet (5 mg total) by mouth daily.   cyanocobalamin  1000 MCG tablet Wait to take this until your doctor or other care provider tells you to start again. Commonly known as: VITAMIN B12 Take 1,000 mcg by mouth daily.       TAKE these medications    aspirin  EC 81 MG tablet Take 81 mg by mouth daily.   calcium  carbonate 1250 (500 Ca) MG chewable tablet Commonly known as: OS-CAL Chew 1 tablet by mouth daily.   cholecalciferol  25 MCG (1000 UNIT) tablet Commonly known as: VITAMIN D3 Take 5,000 Units by mouth daily.   ciprofloxacin  500 MG tablet Commonly known as: Cipro  Take 1 tablet (500 mg total) by mouth 2 (two) times daily for 7 days.   clopidogrel  75 MG tablet Commonly known as: PLAVIX  TAKE 1 TABLET BY MOUTH EVERY DAY   glipiZIDE  2.5 MG 24 hr tablet Commonly known as: GLUCOTROL  XL TAKE 1 TABLET BY MOUTH DAILY WITH BREAKFAST.   levothyroxine  112 MCG tablet Commonly known as: SYNTHROID  Take 1 tablet (112 mcg total) by mouth daily.   lisinopril -hydrochlorothiazide  20-25 MG tablet Commonly known as: ZESTORETIC  Take 1 tablet by mouth daily.   metFORMIN  1000 MG tablet Commonly known as: GLUCOPHAGE  TAKE 1 TABLET (1,000 MG TOTAL) BY MOUTH 2 (TWO) TIMES DAILY. TAKE WITH FOOD   metoprolol  tartrate 50 MG tablet Commonly known as: LOPRESSOR  Take 1 tablet (50 mg total) by mouth 2 (two) times daily.   metroNIDAZOLE  500 MG tablet Commonly known as: FLAGYL  Take 1 tablet (500 mg total) by mouth 3 (three) times daily for 7 days.    ONE TOUCH ULTRA 2 w/Device Kit Use to check blood sugar daily for type 2 diabetes E11.9   OneTouch Ultra test strip Generic drug: glucose blood USE TO CHECK BLOOD SUGAR DAILY. E 11.9 (TYPE 2 DIABETES MELLITIS)   rosuvastatin 5 MG tablet Commonly known as: CRESTOR Take 5 mg by mouth daily.   Trimo-San 0.025 % Gel Generic drug: OXYQUINOLONE SULFATE VAGINAL INSERT 1/3 APPLICATORFUL VAGINALLY EVERY OTHER DAY AS DIRECTED       No Known Allergies Discharge Instructions     Call MD for:  difficulty breathing, headache or visual disturbances   Complete by: As directed    Call MD for:  extreme fatigue   Complete by: As directed    Call MD for:  persistant dizziness or light-headedness   Complete by: As directed    Call MD for:  persistant nausea and vomiting   Complete by: As directed    Call MD for:  severe uncontrolled pain  Complete by: As directed    Call MD for:  temperature >100.4   Complete by: As directed    Diet - low sodium heart healthy   Complete by: As directed    Discharge instructions   Complete by: As directed    Follow-up with PCP in 1 week, continue to monitor BP at home and follow with PCP to titrate medication accordingly.  Hold off amlodipine  if blood pressure remains low, restart if systolic BP is greater than 140 mmHg. Continue antibiotic for 7 days Follow-up with GI as an outpatient in 1 month for possible colonoscopy as an outpatient.   Increase activity slowly   Complete by: As directed        The results of significant diagnostics from this hospitalization (including imaging, microbiology, ancillary and laboratory) are listed below for reference.    Significant Diagnostic Studies: CT ABDOMEN PELVIS W CONTRAST Result Date: 10/05/2023 EXAM: CT ABDOMEN AND PELVIS WITH CONTRAST 10/05/2023 06:10:29 AM TECHNIQUE: CT of the abdomen and pelvis was performed with the administration of intravenous contrast. Multiplanar reformatted images are provided for  review. Automated exposure control, iterative reconstruction, and/or weight based adjustment of the mA/kV was utilized to reduce the radiation dose to as low as reasonably achievable. COMPARISON: 06/09/2018 CLINICAL HISTORY: Sepsis, vague abdominal pain. Evaluation for diverticulitis and intra-abdominal source. Patient to triage via wheelchair. Patient has lower back pain and nausea. Patient denies urinary symptoms. No injury to back symptoms for 4 days. Patient alert, speech clear. History of urinary tract infections. FINDINGS: LOWER CHEST: No acute findings within the imaged portions of the lung bases. LIVER: The liver is unremarkable. GALLBLADDER AND BILE DUCTS: Gallbladder is unremarkable. No biliary ductal dilatation. SPLEEN: The spleen appears normal. PANCREAS: No pancreatic inflammation, main duct dilatation, or mass. ADRENAL GLANDS: Normal adrenal glands. KIDNEYS, URETERS AND BLADDER: Right renal cortical scarring and volume loss. No nephrolithiasis or suspicious kidney mass. No signs of obstructive uropathy. Bladder appears within normal limits. GI AND BOWEL: Moderate hiatal hernia. The appendix is visualized and appears normal. Mild stool burden noted within the right colon. Distal colonic diverticula noted. Focally inflamed diverticula noted within the proximal sigmoid colon with surrounding soft tissue stranding and mild wall thickening, axial image 50/2. No signs of perforation or abscess. PERITONEUM AND RETROPERITONEUM: No free fluid or fluid collections. No signs of pneumoperitoneum. VASCULATURE: Aortic atherosclerosis. No aneurysm. LYMPH NODES: No lymphadenopathy. REPRODUCTIVE ORGANS: The uterus and adnexal structures are unremarkable. A pessary device is in place. BONES AND SOFT TISSUES: Remote healed fracture deformities involving the left superior and inferior pubic rami. Tiny fat-containing umbilical hernia. IMPRESSION: 1. Focally inflamed diverticula in the proximal sigmoid colon with  surrounding soft tissue stranding and mild wall thickening, without signs of perforation or abscess, consistent with diverticulitis. 2. Moderate hiatal hernia. Electronically signed by: Waddell Calk MD 10/05/2023 06:59 AM EDT RP Workstation: [id number]   DG Chest 2 View Result Date: 10/05/2023 EXAM: 2 VIEW(S) XRAY OF THE CHEST 10/05/2023 06:06:00 AM COMPARISON: 10/02/2021 CLINICAL HISTORY: Sepsis screen, fever, leukocytosis. History of CAD and diabetes. FINDINGS: LUNGS AND PLEURA: No focal pulmonary opacity. No pulmonary edema. No pleural effusion. No pneumothorax. Chronic linear scar-like opacity in the left base. HEART AND MEDIASTINUM: No acute abnormality of the cardiac and mediastinal silhouettes. Aortic atherosclerotic calcifications. BONES AND SOFT TISSUES: No acute osseous abnormality. IMPRESSION: 1. No acute findings. Electronically signed by: Waddell Calk MD 10/05/2023 06:53 AM EDT RP Workstation: [id number]    Microbiology: Recent Results (from the past 240  hours)  Blood culture (routine x 2)     Status: None (Preliminary result)   Collection Time: 10/05/23  5:30 AM   Specimen: BLOOD RIGHT ARM  Result Value Ref Range Status   Specimen Description BLOOD RIGHT ARM  Final   Special Requests   Final    BOTTLES DRAWN AEROBIC AND ANAEROBIC Blood Culture adequate volume   Culture   Final    NO GROWTH < 24 HOURS Performed at [facility], [address], [place], [place] [address]    Report Status PENDING  Incomplete  Blood culture (routine x 2)     Status: None (Preliminary result)   Collection Time: 10/05/23  5:30 AM   Specimen: BLOOD RIGHT ARM  Result Value Ref Range Status   Specimen Description BLOOD RIGHT ARM  Final   Special Requests   Final    BOTTLES DRAWN AEROBIC AND ANAEROBIC Blood Culture adequate volume   Culture   Final    NO GROWTH < 24 HOURS Performed at [facility], [address] [place], [place] [address]    Report Status PENDING  Incomplete      Labs: CBC: Recent Labs  Lab 10/04/23 2051 10/06/23 0321  WBC 14.0* 9.8  HGB 10.7* 9.4*  HCT 33.6* 29.6*  MCV 79.1* 78.9*  PLT 419* 341   Basic Metabolic Panel: Recent Labs  Lab 10/04/23 2051 10/05/23 0530 10/06/23 0321  NA 136  --  136  K 4.3  --  3.5  CL 98  --  99  CO2 27  --  27  GLUCOSE 150*  --  145*  BUN 8  --  8  CREATININE 0.60  --  0.61  CALCIUM  9.0  --  8.1*  MG  --  1.2* 1.5*  PHOS  --  2.8 3.4   Liver Function Tests: Recent Labs  Lab 10/04/23 2051  AST 19  ALT 11  ALKPHOS 69  BILITOT 0.4  PROT 7.3  ALBUMIN 3.5   No results for input(s): LIPASE, AMYLASE in the last 168 hours. No results for input(s): AMMONIA in the last 168 hours. Cardiac Enzymes: No results for input(s): CKTOTAL, CKMB, CKMBINDEX, TROPONINI in the last 168 hours. BNP (last 3 results) No results for input(s): BNP in the last 8760 hours. CBG: Recent Labs  Lab 10/05/23 1203 10/05/23 1621 10/05/23 2144 10/06/23 0904 10/06/23 1139  GLUCAP 83 213* 103* 98 158*    Time spent: 35 minutes  Signed:  Elvan Sor  [facility] 10/06/2023 2:37 PM

## 2023-10-10 LAB — CULTURE, BLOOD (ROUTINE X 2)
Culture: NO GROWTH
Culture: NO GROWTH
Special Requests: ADEQUATE
Special Requests: ADEQUATE

## 2023-10-12 ENCOUNTER — Telehealth: Payer: Self-pay

## 2023-10-12 NOTE — Telephone Encounter (Signed)
Please see the message below and advise.

## 2023-10-12 NOTE — Telephone Encounter (Signed)
 Copied from CRM [id number]. Topic: Appointments - Scheduling Inquiry for Clinic >> Oct 12, 2023  8:39 AM Emylou G wrote: Reason for CRM: Patient called.. concerns about Fridays appt.. does she need bloodwork for this appt?

## 2023-10-14 ENCOUNTER — Encounter: Payer: Self-pay | Admitting: Family Medicine

## 2023-10-14 ENCOUNTER — Ambulatory Visit: Admitting: Family Medicine

## 2023-10-14 VITALS — BP 117/67 | HR 80 | Ht 63.0 in | Wt 136.0 lb

## 2023-10-14 DIAGNOSIS — K5732 Diverticulitis of large intestine without perforation or abscess without bleeding: Secondary | ICD-10-CM | POA: Diagnosis not present

## 2023-10-14 DIAGNOSIS — D509 Iron deficiency anemia, unspecified: Secondary | ICD-10-CM | POA: Diagnosis not present

## 2023-10-14 DIAGNOSIS — I10 Essential (primary) hypertension: Secondary | ICD-10-CM | POA: Diagnosis not present

## 2023-10-14 NOTE — Progress Notes (Signed)
 Established patient visit   Patient: April Pittman   DOB: [birth year]   83 y.o. Female  MRN: [id number] Visit Date: 10/14/2023  Today's healthcare provider: Nancyann Perry, MD   Chief Complaint  Patient presents with   Hospitalization Follow-up    She was in the hospital for diverticulitis,     Subjective    Discussed the use of AI scribe software for clinical note transcription with the patient, who gave verbal consent to proceed.  History of Present Illness   April Pittman is an 83 year old female who presents for a hospital follow-up after treatment for sigmoid diverticulitis.  She was admitted to [facility] on August 13th and discharged on August 14th for treatment of sigmoid diverticulitis. Initially, she experienced left lower back pain and abdominal pain, which was confirmed as diverticulitis via CT scan. During her hospital stay, she received IV ceftriaxone  and metronidazole  and was discharged with a seven-day course of oral ciprofloxacin  and metronidazole , which she completed last night. She reports no current abdominal pain.  She has been experiencing watery stools she believes started before her hospital admission, but not having BMs every day. No constipation, and she reports not having daily bowel movements, which is normal for her. She is unsure about the presence of blood in her stool due to vision issues. She has been trying to manage her diet by consuming soft foods like soup and cottage cheese but feels 'real swimmy headed,' likely due to inadequate nutrition.  Her past medical history includes iron  deficiency anemia, for which she was previously followed by hematology. During her recent hospital stay, her magnesium  level was low at 1.2 upon admission and 1.5 at discharge. Her serum iron  level was 17 with an iron  saturation of 5%. She has a history of receiving blood transfusions and iron  infusions in the past but has not been on iron  supplements recently. She stopped  taking her vitamins and blood pressure medication, including amlodipine  and Plavix , while on antibiotics.  She feels weak and attributes this to not eating regular food. She also mentions difficulty sleeping at night and has previously been prescribed sleep medication, which she does not prefer due to side effects.       Medications: Outpatient Medications Prior to Visit  Medication Sig   [Paused] amLODipine  (NORVASC ) 5 MG tablet Take 1 tablet (5 mg total) by mouth daily.   aspirin  EC 81 MG tablet Take 81 mg by mouth daily.   Blood Glucose Monitoring Suppl (ONE TOUCH ULTRA 2) w/Device KIT Use to check blood sugar daily for type 2 diabetes E11.9   calcium  carbonate (OS-CAL) 1250 (500 Ca) MG chewable tablet Chew 1 tablet by mouth daily.   cholecalciferol  (VITAMIN D ) 25 MCG (1000 UNIT) tablet Take 5,000 Units by mouth daily. (Patient not taking: Reported on 10/14/2023)   clopidogrel  (PLAVIX ) 75 MG tablet TAKE 1 TABLET BY MOUTH EVERY DAY   glipiZIDE  (GLUCOTROL  XL) 2.5 MG 24 hr tablet TAKE 1 TABLET BY MOUTH DAILY WITH BREAKFAST.   levothyroxine  (SYNTHROID ) 112 MCG tablet Take 1 tablet (112 mcg total) by mouth daily.   lisinopril -hydrochlorothiazide  (ZESTORETIC ) 20-25 MG tablet Take 1 tablet by mouth daily.   metFORMIN  (GLUCOPHAGE ) 1000 MG tablet TAKE 1 TABLET (1,000 MG TOTAL) BY MOUTH 2 (TWO) TIMES DAILY. TAKE WITH FOOD   metoprolol  tartrate (LOPRESSOR ) 50 MG tablet Take 1 tablet (50 mg total) by mouth 2 (two) times daily.   ONETOUCH ULTRA test strip USE TO CHECK BLOOD  SUGAR DAILY. E 11.9 (TYPE 2 DIABETES MELLITIS)   OXYQUINOLONE SULFATE VAGINAL (TRIMO-SAN) 0.025 % GEL INSERT 1/3 APPLICATORFUL VAGINALLY EVERY OTHER DAY AS DIRECTED   rosuvastatin (CRESTOR) 5 MG tablet Take 5 mg by mouth daily.   [Paused] vitamin B-12 (CYANOCOBALAMIN ) 1000 MCG tablet Take 1,000 mcg by mouth daily.   No facility-administered medications prior to visit.   Review of Systems     Objective    BP 117/67   Pulse 80    Ht 5' 3 (1.6 m)   Wt 136 lb (61.7 kg)   SpO2 98%   BMI 24.09 kg/m   Physical Exam   General Appearance:    Well developed, well nourished female, alert, cooperative, in no acute distress  Eyes:    PERRL, conjunctiva/corneas clear, EOM's intact       Lungs:     Clear to auscultation bilaterally, respirations unlabored  Heart:    Normal heart rate. Normal rhythm. No murmurs, rubs, or gallops.    Abdomen:   bowel sounds present and normal in all 4 quadrants, soft, round, nontender, or nondistended. No CVA tenderness      Assessment & Plan        Sigmoid diverticulitis, recently treated Recently completed antibiotics with resolved infection. No abdominal pain or fever. Watery stools persist. - Advise normal diet, avoid seeds. - Monitor for infection recurrence signs, report if symptoms occur.  Iron  deficiency anemia Low serum iron  levels noted. Symptoms include weakness, likely due to low iron . - Start iron  sulfate 325 mg daily. - Follow-up in 4 weeks to recheck iron  levels.  Hypomagnesemia Low magnesium  level noted, improved from 1.2 to 1.5 at discharge.  Insomnia Difficulty sleeping, previous sleep aids not well-tolerated. Discussed melatonin as alternative. - Recommend melatonin 5 mg, one hour before bedtime.  Hypertension Not taking amlodipine  due to low blood pressure, likely from reduced food intake. To reassess after dietary normalization. - Continue to hold amlodipine . - Reassess blood pressure at follow-up in 4 weeks.  Follow-Up - Schedule follow-up in 4 weeks. - Provide visit summary with instructions for iron  supplementation and melatonin use.    Return in about 4 weeks (around 11/11/2023).    Addressed extensive list of chronic and acute medical problems today requiring 45 minutes reviewing her medical record, counseling patient regarding her conditions and coordination of care.    Nancyann Perry, MD  [facility] [phone]  (phone) [phone] (fax)  [facility]

## 2023-10-14 NOTE — Patient Instructions (Addendum)
 Please review the attached list of medications and notify my office if there are any errors.   Tart taking iron  sulfate 325 once a day  Try taking melatonin 5mg  tablet an hour before going to bed every night  Stay OFF of amlodipine  for the time being

## 2023-10-18 DIAGNOSIS — H353231 Exudative age-related macular degeneration, bilateral, with active choroidal neovascularization: Secondary | ICD-10-CM | POA: Diagnosis not present

## 2023-10-18 DIAGNOSIS — H353221 Exudative age-related macular degeneration, left eye, with active choroidal neovascularization: Secondary | ICD-10-CM | POA: Diagnosis not present

## 2023-10-28 ENCOUNTER — Other Ambulatory Visit: Payer: Self-pay | Admitting: Family Medicine

## 2023-10-28 DIAGNOSIS — E039 Hypothyroidism, unspecified: Secondary | ICD-10-CM

## 2023-11-18 ENCOUNTER — Ambulatory Visit: Admitting: Family Medicine

## 2023-11-25 ENCOUNTER — Encounter: Payer: Self-pay | Admitting: Family Medicine

## 2023-11-25 ENCOUNTER — Ambulatory Visit: Admitting: Family Medicine

## 2023-11-25 VITALS — BP 148/70 | HR 60 | Ht 63.0 in | Wt 135.5 lb

## 2023-11-25 DIAGNOSIS — I10 Essential (primary) hypertension: Secondary | ICD-10-CM | POA: Diagnosis not present

## 2023-11-25 DIAGNOSIS — E039 Hypothyroidism, unspecified: Secondary | ICD-10-CM | POA: Diagnosis not present

## 2023-11-25 DIAGNOSIS — D509 Iron deficiency anemia, unspecified: Secondary | ICD-10-CM

## 2023-11-25 DIAGNOSIS — I700 Atherosclerosis of aorta: Secondary | ICD-10-CM | POA: Diagnosis not present

## 2023-11-25 DIAGNOSIS — E538 Deficiency of other specified B group vitamins: Secondary | ICD-10-CM

## 2023-11-25 DIAGNOSIS — E7800 Pure hypercholesterolemia, unspecified: Secondary | ICD-10-CM

## 2023-11-25 DIAGNOSIS — E11311 Type 2 diabetes mellitus with unspecified diabetic retinopathy with macular edema: Secondary | ICD-10-CM

## 2023-11-25 DIAGNOSIS — E559 Vitamin D deficiency, unspecified: Secondary | ICD-10-CM | POA: Diagnosis not present

## 2023-11-25 DIAGNOSIS — M810 Age-related osteoporosis without current pathological fracture: Secondary | ICD-10-CM | POA: Diagnosis not present

## 2023-11-25 MED ORDER — AMLODIPINE BESYLATE 2.5 MG PO TABS: 2.5000 mg | ORAL_TABLET | Freq: Every day | ORAL | 1 refills | Status: AC

## 2023-11-25 NOTE — Patient Instructions (Signed)
 SABRA  Please review the attached list of medications and notify my office if there are any errors.   . Please bring all of your medications to every appointment so we can make sure that our medication list is the same as yours.

## 2023-11-25 NOTE — Progress Notes (Signed)
 Established patient visit   Patient: April Pittman   DOB: 1940/05/09   83 y.o. Female  MRN: [id number] Visit Date: 11/25/2023  Today's healthcare provider: Nancyann Perry, MD   Chief Complaint  Patient presents with   Medical Management of Chronic Issues    3 months to check on diabetes, thyroid , vitamin B12 and D levels,   Diabetes   Hypothyroidism   Vitamin Defienceies   Subjective    Discussed the use of AI scribe software for clinical note transcription with the patient, who gave verbal consent to proceed.  History of Present Illness   April Pittman is an 83 year old female with hypertension and vitamin D  deficiency who presents for a follow-up on her blood pressure and vitamin D  levels.  She feels weak today, attributing this to inadequate food intake, having only consumed oatmeal and toast for breakfast. She is not taking B12 supplements, having switched to iron  instead. She tolerates her medications well but is concerned about the accuracy of her home blood pressure machine, which she has since discarded.  She has a history of hypertension and was previously on amlodipine  5 mg, which was discontinued after a hospital stay in August due to hypotension. Her blood pressure has since increased.  No significant trouble with breathing or shortness of breath. She notes some swelling in her feet but does not report other significant symptoms.  [facility] has been using [facility] but is considering changing due to delays in service and delivery issues. Her granddaughter has suggested another pharmacy, but she has not made a decision yet.     Lab Results  Component Value Date   VITAMINB12 >2000 (H) 08/24/2023   Lab Results  Component Value Date   VD25OH 26.7 (L) 08/24/2023   Lab Results  Component Value Date   HGBA1C 7.3 (H) 07/27/2023   Last thyroid  functions Lab Results  Component Value Date   TSH 0.756 07/27/2023   T4TOTAL 9.8 04/28/2016   Lab  Results  Component Value Date   CHOL 137 10/07/2022   HDL 50 10/07/2022   LDLCALC 62 10/07/2022   LDLDIRECT 73 05/11/2017   TRIG 147 10/07/2022   CHOLHDL 2.7 10/07/2022     Medications: Outpatient Medications Prior to Visit  Medication Sig   aspirin  EC 81 MG tablet Take 81 mg by mouth daily.   Blood Glucose Monitoring Suppl (ONE TOUCH ULTRA 2) w/Device KIT Use to check blood sugar daily for type 2 diabetes E11.9   calcium  carbonate (OS-CAL) 1250 (500 Ca) MG chewable tablet Chew 1 tablet by mouth daily.   cholecalciferol  (VITAMIN D ) 25 MCG (1000 UNIT) tablet Take 5,000 Units by mouth daily.   clopidogrel  (PLAVIX ) 75 MG tablet TAKE 1 TABLET BY MOUTH EVERY DAY   glipiZIDE  (GLUCOTROL  XL) 2.5 MG 24 hr tablet TAKE 1 TABLET BY MOUTH DAILY WITH BREAKFAST.   levothyroxine  (SYNTHROID ) 112 MCG tablet Take 1 tablet (112 mcg total) by mouth daily.   lisinopril -hydrochlorothiazide  (ZESTORETIC ) 20-25 MG tablet Take 1 tablet by mouth daily.   metFORMIN  (GLUCOPHAGE ) 1000 MG tablet TAKE 1 TABLET (1,000 MG TOTAL) BY MOUTH 2 (TWO) TIMES DAILY. TAKE WITH FOOD   metoprolol  tartrate (LOPRESSOR ) 50 MG tablet Take 1 tablet (50 mg total) by mouth 2 (two) times daily.   ONETOUCH ULTRA test strip USE TO CHECK BLOOD SUGAR DAILY. E 11.9 (TYPE 2 DIABETES MELLITIS)   OXYQUINOLONE SULFATE VAGINAL (TRIMO-SAN) 0.025 % GEL INSERT 1/3 APPLICATORFUL VAGINALLY EVERY OTHER DAY  AS DIRECTED   rosuvastatin (CRESTOR) 5 MG tablet Take 5 mg by mouth daily.   [Paused] vitamin B-12 (CYANOCOBALAMIN ) 1000 MCG tablet Take 1,000 mcg by mouth daily.   [DISCONTINUED] amLODipine  (NORVASC ) 5 MG tablet Take 1 tablet (5 mg total) by mouth daily. (NOT TAKING)   No facility-administered medications prior to visit.   Review of Systems     Objective    BP (!) 148/70   Pulse 60   Ht 5' 3 (1.6 m)   Wt 135 lb 8 oz (61.5 kg)   SpO2 99%   BMI 24.00 kg/m   Physical Exam   General: Appearance:    Well developed, well nourished female in  no acute distress  Eyes:    PERRL, conjunctiva/corneas clear, EOM's intact       Lungs:     Clear to auscultation bilaterally, respirations unlabored  Heart:    Normal heart rate. Normal rhythm. No murmurs, rubs, or gallops.    MS:   All extremities are intact.    Neurologic:   Awake, alert, oriented x 3. No apparent focal neurological defect.       Assessment & Plan     1. Primary hypertension (Primary) Was having hypotension on 5mg  amlodipine , but now SBP consistently high. Will start back but on lower dose of  amLODipine  (NORVASC ) 2.5 MG tablet; Take 1 tablet (2.5 mg total) by mouth daily.  Dispense: 90 tablet; Refill: 1  2. Vitamin D  deficiency No taking vitamin d  supplements consistently.  - VITAMIN D  25 Hydroxy (Vit-D Deficiency, Fractures)  3. B12 deficiency She stopped taking b12 supplements a few months ago.  - Vitamin B12  4. Hypothyroidism, unspecified type Clinically appears euthyroid on current dose of levothyroxine . .  - T4 AND TSH  5. Aortic atherosclerosis Asymptomatic. Compliant with medication.  Continue aggressive risk factor modification.    6. Hypercholesteremia She is tolerating rosuvastatin well with no adverse effects.   - Comprehensive metabolic panel with GFR - Lipid Panel With LDL/HDL Ratio  7. Iron  deficiency anemia, unspecified iron  deficiency anemia type Taking iron  supplement daily.   8. 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 Osteoporosis, unspecified osteoporosis type, unspecified pathological fracture presence  - DG Bone Density; Future      Nancyann Perry, MD  [facility] [phone] (phone) [phone] (fax)  [facility]

## 2023-11-26 LAB — COMPREHENSIVE METABOLIC PANEL WITH GFR
ALT: 12 IU/L (ref 0–32)
AST: 19 IU/L (ref 0–40)
Albumin: 4.3 g/dL (ref 3.7–4.7)
Alkaline Phosphatase: 94 IU/L (ref 48–129)
BUN/Creatinine Ratio: 14 (ref 12–28)
BUN: 10 mg/dL (ref 8–27)
Bilirubin Total: 0.2 mg/dL (ref 0.0–1.2)
CO2: 24 mmol/L (ref 20–29)
Calcium: 9.7 mg/dL (ref 8.7–10.3)
Chloride: 95 mmol/L — ABNORMAL LOW (ref 96–106)
Creatinine, Ser: 0.69 mg/dL (ref 0.57–1.00)
Globulin, Total: 3.2 g/dL (ref 1.5–4.5)
Glucose: 75 mg/dL (ref 70–99)
Potassium: 3.9 mmol/L (ref 3.5–5.2)
Sodium: 137 mmol/L (ref 134–144)
Total Protein: 7.5 g/dL (ref 6.0–8.5)
eGFR: 87 mL/min/1.73 (ref >59)

## 2023-11-26 LAB — VITAMIN B12: Vitamin B-12: 258 pg/mL (ref 232–1245)

## 2023-11-26 LAB — LIPID PANEL WITH LDL/HDL RATIO
Cholesterol, Total: 166 mg/dL (ref 100–199)
HDL: 53 mg/dL (ref >39)
LDL Chol Calc (NIH): 74 mg/dL (ref 0–99)
LDL/HDL Ratio: 1.4 ratio (ref 0.0–3.2)
Triglycerides: 237 mg/dL — ABNORMAL HIGH (ref 0–149)
VLDL Cholesterol Cal: 39 mg/dL (ref 5–40)

## 2023-11-26 LAB — VITAMIN D 25 HYDROXY (VIT D DEFICIENCY, FRACTURES): Vit D, 25-Hydroxy: 34.5 ng/mL (ref 30.0–100.0)

## 2023-11-26 LAB — T4 AND TSH
T4, Total: 9.9 ug/dL (ref 4.5–12.0)
TSH: 0.543 u[IU]/mL (ref 0.450–4.500)

## 2023-11-26 LAB — MICROALBUMIN / CREATININE URINE RATIO
Creatinine, Urine: 85 mg/dL
Microalb/Creat Ratio: 10 mg/g{creat} (ref 0–29)
Microalbumin, Urine: 8.3 ug/mL

## 2023-11-26 LAB — HEMOGLOBIN A1C
Est. average glucose Bld gHb Est-mCnc: 143 mg/dL
Hgb A1c MFr Bld: 6.6 % — ABNORMAL HIGH (ref 4.8–5.6)

## 2023-11-27 ENCOUNTER — Ambulatory Visit: Payer: Self-pay | Admitting: Family Medicine

## 2023-12-05 DIAGNOSIS — L570 Actinic keratosis: Secondary | ICD-10-CM | POA: Diagnosis not present

## 2023-12-05 DIAGNOSIS — D2262 Melanocytic nevi of left upper limb, including shoulder: Secondary | ICD-10-CM | POA: Diagnosis not present

## 2023-12-05 DIAGNOSIS — D2261 Melanocytic nevi of right upper limb, including shoulder: Secondary | ICD-10-CM | POA: Diagnosis not present

## 2023-12-05 DIAGNOSIS — Z85820 Personal history of malignant melanoma of skin: Secondary | ICD-10-CM | POA: Diagnosis not present

## 2023-12-05 DIAGNOSIS — D225 Melanocytic nevi of trunk: Secondary | ICD-10-CM | POA: Diagnosis not present

## 2023-12-05 DIAGNOSIS — D2272 Melanocytic nevi of left lower limb, including hip: Secondary | ICD-10-CM | POA: Diagnosis not present

## 2023-12-13 ENCOUNTER — Other Ambulatory Visit: Payer: Self-pay | Admitting: Family Medicine

## 2023-12-13 DIAGNOSIS — E039 Hypothyroidism, unspecified: Secondary | ICD-10-CM

## 2023-12-13 DIAGNOSIS — H353221 Exudative age-related macular degeneration, left eye, with active choroidal neovascularization: Secondary | ICD-10-CM | POA: Diagnosis not present

## 2023-12-13 DIAGNOSIS — H353211 Exudative age-related macular degeneration, right eye, with active choroidal neovascularization: Secondary | ICD-10-CM | POA: Diagnosis not present

## 2023-12-13 DIAGNOSIS — Z961 Presence of intraocular lens: Secondary | ICD-10-CM | POA: Diagnosis not present

## 2023-12-13 DIAGNOSIS — E119 Type 2 diabetes mellitus without complications: Secondary | ICD-10-CM | POA: Diagnosis not present

## 2023-12-13 LAB — OPHTHALMOLOGY REPORT-SCANNED

## 2023-12-14 ENCOUNTER — Telehealth: Payer: Self-pay | Admitting: Family Medicine

## 2023-12-14 ENCOUNTER — Other Ambulatory Visit: Payer: Self-pay | Admitting: Family Medicine

## 2023-12-14 DIAGNOSIS — E039 Hypothyroidism, unspecified: Secondary | ICD-10-CM

## 2023-12-14 NOTE — Telephone Encounter (Unsigned)
 Copied from CRM [id number]. Topic: Clinical - Medication Refill >> Dec 14, 2023 10:41 AM Tiffini S wrote: Medication: levothyroxine  (SYNTHROID ) 112 MCG tablet   Has the patient contacted their pharmacy? No (Agent: If no, request that the patient contact the pharmacy for the refill. If patient does not wish to contact the pharmacy document the reason why and proceed with request.) (Agent: If yes, when and what did the pharmacy advise?)  This is the patient's preferred pharmacy:  [id number] [place], [place] - [address] [place] [address] Phone: [phone] Fax: [phone]   Is this the correct pharmacy for this prescription? Yes If no, delete pharmacy and type the correct one.   Has the prescription been filled recently? Yes  Is the patient out of the medication? Yes, few more tablets left   Has the patient been seen for an appointment in the last year OR does the patient have an upcoming appointment? Yes  Can we respond through MyChart? No, please call at [phone]  Agent: Please be advised that Rx refills may take up to 3 business days. We ask that you follow-up with your pharmacy.

## 2023-12-14 NOTE — Telephone Encounter (Unsigned)
 Copied from CRM #[id number]. Topic: Clinical - Lab/Test Results >> Dec 14, 2023 11:02 AM Tiffini S wrote: Reason for CRM:  Patient have questions about the vitamin B12 and taking iron - wants to know which supplements to take.   Patient do not answer her cell phone- she has a landline with a new answering machine- she receive a lot of spam calls- please leave a detail message if no answer   Patient call hear when message is left/ talking and she will keep up at [phone]

## 2023-12-15 NOTE — Telephone Encounter (Signed)
 She is supposed to take vitamin B12 500mcg daily, and iron  sulfate 325mg  daily.

## 2023-12-15 NOTE — Telephone Encounter (Signed)
 Patient advised.

## 2023-12-16 NOTE — Telephone Encounter (Signed)
 Refilled on 12/14/23 in a separate encounter.

## 2024-01-30 DIAGNOSIS — I700 Atherosclerosis of aorta: Secondary | ICD-10-CM | POA: Diagnosis not present

## 2024-01-30 DIAGNOSIS — Z955 Presence of coronary angioplasty implant and graft: Secondary | ICD-10-CM | POA: Diagnosis not present

## 2024-01-30 DIAGNOSIS — I2510 Atherosclerotic heart disease of native coronary artery without angina pectoris: Secondary | ICD-10-CM | POA: Diagnosis not present

## 2024-01-30 DIAGNOSIS — E119 Type 2 diabetes mellitus without complications: Secondary | ICD-10-CM | POA: Diagnosis not present

## 2024-01-30 DIAGNOSIS — E7800 Pure hypercholesterolemia, unspecified: Secondary | ICD-10-CM | POA: Diagnosis not present

## 2024-01-30 DIAGNOSIS — I10 Essential (primary) hypertension: Secondary | ICD-10-CM | POA: Diagnosis not present

## 2024-02-14 ENCOUNTER — Telehealth: Payer: Self-pay

## 2024-02-14 NOTE — Telephone Encounter (Signed)
 There is nothing attached to this encounter, do you know what this for?

## 2024-02-18 ENCOUNTER — Other Ambulatory Visit: Payer: Self-pay | Admitting: Family Medicine

## 2024-02-18 DIAGNOSIS — E1165 Type 2 diabetes mellitus with hyperglycemia: Secondary | ICD-10-CM

## 2024-02-21 ENCOUNTER — Ambulatory Visit: Admitting: Obstetrics & Gynecology

## 2024-02-21 ENCOUNTER — Encounter: Payer: Self-pay | Admitting: Obstetrics & Gynecology

## 2024-02-21 ENCOUNTER — Other Ambulatory Visit: Payer: Self-pay

## 2024-02-21 VITALS — BP 127/75 | HR 69 | Ht 60.0 in | Wt 132.0 lb

## 2024-02-21 DIAGNOSIS — Z4689 Encounter for fitting and adjustment of other specified devices: Secondary | ICD-10-CM

## 2024-02-21 DIAGNOSIS — N814 Uterovaginal prolapse, unspecified: Secondary | ICD-10-CM | POA: Diagnosis not present

## 2024-02-21 MED ORDER — OXYQUINOLONE SULFATE 0.025 % VA GEL
VAGINAL | 12 refills | Status: AC
  Filled 2024-02-21: qty 113.4, 30d supply, fill #0

## 2024-02-21 NOTE — Progress Notes (Signed)
" ° ° °  GYNECOLOGY PROGRESS NOTE  Subjective:    Patient ID: April Pittman, female    DOB: Oct 23, 1940, 83 y.o.   MRN: [id number]  HPI  Patient is a 83 y.o. widowed P3 here for pessary maintenance. She has been using a #2 Gelhorn for years. She has no complaints today. She reports normal bowel and bladder function (although she has some diverticulitis now).  The following portions of the patient's history were reviewed and updated as appropriate: allergies, current medications, past family history, past medical history, past social history, past surgical history, and problem list.  Review of Systems Pertinent items are noted in HPI.   Objective:   Height 5' (1.524 m), weight 132 lb (59.9 kg). Body mass index is 25.78 kg/m. Well nourished, well hydrated White female, no apparent distress She is ambulating and conversing normally. I removed the Gelhorn with the help of a ring forceps. I inspected the vagina with a Graves speculum.  There were no excoriations and only mild atrophy.   Assessment:   1. Encounter for pessary maintenance   2. Cystocele with prolapse      Plan:   1. Encounter for pessary maintenance (Primary)   2. Cystocele with prolapse - She will come back in 4 months I prescribed the Trimo san to a different pharmacy per her request, since [facility].   "

## 2024-02-22 ENCOUNTER — Other Ambulatory Visit: Payer: Self-pay

## 2024-02-24 ENCOUNTER — Ambulatory Visit: Admitting: Family Medicine

## 2024-02-27 ENCOUNTER — Ambulatory Visit (INDEPENDENT_AMBULATORY_CARE_PROVIDER_SITE_OTHER): Admitting: Family Medicine

## 2024-02-27 ENCOUNTER — Encounter: Payer: Self-pay | Admitting: Family Medicine

## 2024-02-27 VITALS — BP 134/62 | HR 74 | Resp 16 | Ht 64.0 in | Wt 137.0 lb

## 2024-02-27 DIAGNOSIS — E11311 Type 2 diabetes mellitus with unspecified diabetic retinopathy with macular edema: Secondary | ICD-10-CM

## 2024-02-27 DIAGNOSIS — I10 Essential (primary) hypertension: Secondary | ICD-10-CM | POA: Diagnosis not present

## 2024-02-27 DIAGNOSIS — Z23 Encounter for immunization: Secondary | ICD-10-CM

## 2024-02-27 MED ORDER — ROSUVASTATIN CALCIUM 5 MG PO TABS: 5.0000 mg | ORAL_TABLET | Freq: Every day | ORAL | 3 refills | Status: AC

## 2024-02-27 NOTE — Progress Notes (Signed)
 "     Established patient visit   Patient: April Pittman   DOB: April 06, 1940   84 y.o. Female  MRN: [id number] Visit Date: 02/27/2024  Today's healthcare provider: Nancyann Perry, MD   Chief Complaint  Patient presents with   Follow-up   Hypertension    F/u   Subjective    Discussed the use of AI scribe software for clinical note transcription with the patient, who gave verbal consent to proceed.  History of Present Illness   April Pittman is an 84 year old female with hypertension who presents for follow-up.  She was last seen in early October when she was restarted on amlodipine  2.5 mg due to previous hypotension with a 5 mg dose. Her most recent office blood pressure reading was 134/62. She noted low readings at the cardiologist's office, around 90/60 and 100/?, and a good reading at the gynecologist's office last week, around 120/?SABRA  She is experiencing stress due to her son's hospitalization for gallbladder surgery, which was complicated by a bowel leak requiring stent placement. She believes this stress may have impacted her blood pressure.  She is currently taking over-the-counter vitamin D3 and B12. She has not been taking rosuvastatin  calcium , although she still has a bottle of it. She has not experienced any issues with her blood pressure medication.  She recalls a past episode of foot swelling, which resolved on its own, possibly due to a bite, and has not had any further issues with it.     Medications: Show/hide medication list[1] Review of Systems  Constitutional:  Negative for appetite change, chills, fatigue and fever.  Respiratory:  Negative for chest tightness and shortness of breath.   Cardiovascular:  Negative for chest pain and palpitations.  Gastrointestinal:  Negative for abdominal pain, nausea and vomiting.  Neurological:  Negative for dizziness and weakness.       Objective    BP 134/62 (BP Location: Left Arm, Patient Position: Sitting, Cuff Size:  Normal)   Pulse 74   Resp 16   Ht 5' 4 (1.626 m)   Wt 137 lb (62.1 kg)   SpO2 100%   BMI 23.52 kg/m   Physical Exam   General appearance: Well developed, well nourished female, cooperative and in no acute distress Head: Normocephalic, without obvious abnormality, atraumatic Respiratory: Respirations even and unlabored, normal respiratory rate Extremities: All extremities are intact.  Skin: Skin color, texture, turgor normal. No rashes seen  Psych: Appropriate mood and affect. Neurologic: Mental status: Alert, oriented to person, place, and time, thought content appropriate.     Assessment & Plan     1. Primary hypertension (Primary) Much better since starting back on amlodipine . Tolerating the 2.5mg  much better than the previous prescription of 5mg .   Looks like she has been out of her stating. Prescription rosuvastatin  (CRESTOR ) 5 MG tablet; Take 1 tablet (5 mg total) by mouth daily.  Dispense: 90 tablet; Refill: 3  2. 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 UTD ophthalmology follow up. Continue current medications.  Check A1c with other routine labs at follow up in 3 months.   Other orders  - Flu vaccine HIGH DOSE PF(Fluzone Trivalent)      Nancyann Perry, MD  [facility] [phone] (phone) [phone] (fax)  [facility]    [1]  Outpatient Medications Prior to Visit  Medication  Sig   amLODipine  (NORVASC ) 2.5 MG tablet Take 1 tablet (2.5 mg total) by mouth daily.   aspirin  EC 81 MG tablet Take 81 mg by mouth daily.   Blood Glucose Monitoring Suppl (ONE TOUCH ULTRA 2) w/Device KIT Use to check blood sugar daily for type 2 diabetes E11.9   calcium  carbonate (OS-CAL) 1250 (500 Ca) MG chewable tablet Chew 1 tablet by mouth daily.   cholecalciferol  (VITAMIN D ) 25 MCG (1000  UNIT) tablet Take 5,000 Units by mouth daily.   clopidogrel  (PLAVIX ) 75 MG tablet TAKE 1 TABLET BY MOUTH EVERY DAY   glipiZIDE  (GLUCOTROL  XL) 2.5 MG 24 hr tablet TAKE 1 TABLET BY MOUTH DAILY WITH BREAKFAST.   levothyroxine  (SYNTHROID ) 112 MCG tablet TAKE 1 TABLET BY MOUTH EVERY DAY   lisinopril -hydrochlorothiazide  (ZESTORETIC ) 20-25 MG tablet Take 1 tablet by mouth daily.   metFORMIN  (GLUCOPHAGE ) 1000 MG tablet TAKE 1 TABLET (1,000 MG TOTAL) BY MOUTH 2 (TWO) TIMES DAILY. TAKE WITH FOOD   metoprolol  tartrate (LOPRESSOR ) 50 MG tablet Take 1 tablet (50 mg total) by mouth 2 (two) times daily.   ONETOUCH ULTRA test strip USE TO CHECK BLOOD SUGAR DAILY. E 11.9 (TYPE 2 DIABETES MELLITIS)   OXYQUINOLONE SULFATE VAGINAL (TRIMO-SAN) 0.025 % GEL INSERT 1/3 APPLICATORFUL VAGINALLY EVERY OTHER DAY AS DIRECTED   OXYQUINOLONE SULFATE VAGINAL 0.025 % GEL Place into vagina daily   [Paused] vitamin B-12 (CYANOCOBALAMIN ) 1000 MCG tablet Take 1,000 mcg by mouth daily.   [DISCONTINUED] rosuvastatin  (CRESTOR ) 5 MG tablet Take 5 mg by mouth daily.   No facility-administered medications prior to visit.   "

## 2024-02-27 NOTE — Patient Instructions (Signed)
 SABRA  Please review the attached list of medications and notify my office if there are any errors.   . Please bring all of your medications to every appointment so we can make sure that our medication list is the same as yours.

## 2024-02-29 NOTE — Progress Notes (Signed)
 April Pittman                                          MRN: [id number]   02/29/2024   The [facility] Quality Team Specialist reviewed this patient medical record for the purposes of chart review for care gap closure. The following were reviewed: abstraction for care gap closure-controlling blood pressure.    [facility] Quality Team

## 2024-03-13 ENCOUNTER — Other Ambulatory Visit: Payer: Self-pay | Admitting: Family Medicine

## 2024-06-01 ENCOUNTER — Ambulatory Visit: Admitting: Family Medicine

## 2024-07-17 ENCOUNTER — Ambulatory Visit
